# Patient Record
Sex: Female | Born: 1945
Health system: Southern US, Community
[De-identification: ages and names within clinical notes are randomized; demographics above are authoritative.]

## PROBLEM LIST (undated history)

## (undated) DIAGNOSIS — H269 Unspecified cataract: Secondary | ICD-10-CM

## (undated) DIAGNOSIS — L299 Pruritus, unspecified: Secondary | ICD-10-CM

## (undated) DIAGNOSIS — H401430 Capsular glaucoma with pseudoexfoliation of lens, bilateral, stage unspecified: Secondary | ICD-10-CM

## (undated) DIAGNOSIS — I10 Essential (primary) hypertension: Secondary | ICD-10-CM

## (undated) DIAGNOSIS — F32A Depression, unspecified: Secondary | ICD-10-CM

## (undated) DIAGNOSIS — H35351 Cystoid macular degeneration, right eye: Secondary | ICD-10-CM

## (undated) DIAGNOSIS — J302 Other seasonal allergic rhinitis: Secondary | ICD-10-CM

## (undated) DIAGNOSIS — M1990 Unspecified osteoarthritis, unspecified site: Secondary | ICD-10-CM

## (undated) DIAGNOSIS — J45909 Unspecified asthma, uncomplicated: Secondary | ICD-10-CM

## (undated) DIAGNOSIS — K635 Polyp of colon: Secondary | ICD-10-CM

## (undated) DIAGNOSIS — F329 Major depressive disorder, single episode, unspecified: Secondary | ICD-10-CM

## (undated) DIAGNOSIS — Z961 Presence of intraocular lens: Secondary | ICD-10-CM

## (undated) DIAGNOSIS — Z800 Family history of malignant neoplasm of digestive organs: Secondary | ICD-10-CM

## (undated) DIAGNOSIS — H409 Unspecified glaucoma: Secondary | ICD-10-CM

## (undated) DIAGNOSIS — I82409 Acute embolism and thrombosis of unspecified deep veins of unspecified lower extremity: Secondary | ICD-10-CM

## (undated) DIAGNOSIS — H2011 Chronic iridocyclitis, right eye: Secondary | ICD-10-CM

## (undated) DIAGNOSIS — H35033 Hypertensive retinopathy, bilateral: Secondary | ICD-10-CM

## (undated) DIAGNOSIS — Z923 Personal history of irradiation: Secondary | ICD-10-CM

## (undated) DIAGNOSIS — M7020 Olecranon bursitis, unspecified elbow: Secondary | ICD-10-CM

## (undated) DIAGNOSIS — M948X9 Other specified disorders of cartilage, unspecified sites: Secondary | ICD-10-CM

## (undated) DIAGNOSIS — T7840XA Allergy, unspecified, initial encounter: Secondary | ICD-10-CM

## (undated) DIAGNOSIS — Z8719 Personal history of other diseases of the digestive system: Secondary | ICD-10-CM

## (undated) DIAGNOSIS — C50911 Malignant neoplasm of unspecified site of right female breast: Secondary | ICD-10-CM

## (undated) DIAGNOSIS — C50919 Malignant neoplasm of unspecified site of unspecified female breast: Secondary | ICD-10-CM

## (undated) DIAGNOSIS — F419 Anxiety disorder, unspecified: Secondary | ICD-10-CM

## (undated) DIAGNOSIS — H1820 Unspecified corneal edema: Secondary | ICD-10-CM

## (undated) DIAGNOSIS — C159 Malignant neoplasm of esophagus, unspecified: Secondary | ICD-10-CM

## (undated) DIAGNOSIS — Z9221 Personal history of antineoplastic chemotherapy: Secondary | ICD-10-CM

## (undated) HISTORY — DX: Essential (primary) hypertension: I10

## (undated) HISTORY — DX: Other seasonal allergic rhinitis: J30.2

## (undated) HISTORY — DX: Unspecified osteoarthritis, unspecified site: M19.90

## (undated) HISTORY — DX: Depression, unspecified: F32.A

## (undated) HISTORY — DX: Unspecified asthma, uncomplicated: J45.909

## (undated) HISTORY — DX: Major depressive disorder, single episode, unspecified: F32.9

## (undated) HISTORY — DX: Pruritus, unspecified: L29.9

## (undated) HISTORY — DX: Other specified disorders of cartilage, unspecified sites: M94.8X9

## (undated) HISTORY — PX: COLONOSCOPY: SHX174

## (undated) HISTORY — PX: OTHER SURGICAL HISTORY: SHX169

## (undated) HISTORY — DX: Unspecified corneal edema: H18.20

## (undated) HISTORY — DX: Family history of malignant neoplasm of digestive organs: Z80.0

## (undated) HISTORY — DX: Malignant neoplasm of unspecified site of right female breast: C50.911

## (undated) HISTORY — PX: POLYPECTOMY: SHX149

## (undated) HISTORY — DX: Malignant neoplasm of esophagus, unspecified: C15.9

## (undated) HISTORY — DX: Anxiety disorder, unspecified: F41.9

## (undated) HISTORY — PX: CATARACT EXTRACTION: SUR2

## (undated) HISTORY — DX: Malignant neoplasm of unspecified site of unspecified female breast: C50.919

## (undated) HISTORY — DX: Personal history of other diseases of the digestive system: Z87.19

## (undated) HISTORY — DX: Allergy, unspecified, initial encounter: T78.40XA

## (undated) HISTORY — DX: Unspecified cataract: H26.9

## (undated) HISTORY — DX: Polyp of colon: K63.5

## (undated) HISTORY — PX: DUPUYTREN CONTRACTURE RELEASE: SHX1478

## (undated) HISTORY — DX: Chronic iridocyclitis, right eye: H20.11

## (undated) HISTORY — DX: Presence of intraocular lens: Z96.1

## (undated) HISTORY — DX: Acute embolism and thrombosis of unspecified deep veins of unspecified lower extremity: I82.409

## (undated) HISTORY — DX: Olecranon bursitis, unspecified elbow: M70.20

## (undated) HISTORY — DX: Unspecified glaucoma: H40.9

## (undated) HISTORY — DX: Cystoid macular degeneration, right eye: H35.351

## (undated) HISTORY — DX: Capsular glaucoma with pseudoexfoliation of lens, bilateral, stage unspecified: H40.1430

## (undated) HISTORY — DX: Hypertensive retinopathy, bilateral: H35.033

---
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
## 1965-11-25 HISTORY — PX: TONSILLECTOMY: SUR1361

## 1994-11-25 DIAGNOSIS — C50911 Malignant neoplasm of unspecified site of right female breast: Secondary | ICD-10-CM

## 1994-11-25 HISTORY — PX: BREAST LUMPECTOMY: SHX2

## 1994-11-25 HISTORY — DX: Malignant neoplasm of unspecified site of right female breast: C50.911

## 2005-11-25 HISTORY — PX: ROTATOR CUFF REPAIR: SHX139

## 2005-11-25 HISTORY — PX: LASIK: SHX215

## 2009-11-25 HISTORY — PX: BREAST LUMPECTOMY: SHX2

## 2010-11-25 DIAGNOSIS — C50919 Malignant neoplasm of unspecified site of unspecified female breast: Secondary | ICD-10-CM

## 2010-11-25 HISTORY — DX: Malignant neoplasm of unspecified site of unspecified female breast: C50.919

## 2012-08-06 ENCOUNTER — Other Ambulatory Visit: Payer: Self-pay | Admitting: Internal Medicine

## 2012-08-06 DIAGNOSIS — Z9889 Other specified postprocedural states: Secondary | ICD-10-CM

## 2012-08-06 DIAGNOSIS — Z853 Personal history of malignant neoplasm of breast: Secondary | ICD-10-CM

## 2012-08-10 ENCOUNTER — Telehealth: Payer: Self-pay | Admitting: *Deleted

## 2012-08-10 NOTE — Telephone Encounter (Signed)
Left message for pt to return my call so I can schedule a med onc appt. 

## 2012-08-21 ENCOUNTER — Telehealth: Payer: Self-pay | Admitting: *Deleted

## 2012-08-21 NOTE — Telephone Encounter (Signed)
Patient returned my call and I confirmed 09/28/12 appt w/ pt.  Jo Morrison w/ referring to make her aware.  Mailed before appt letter & packet to pt.  Took paperwork to Med [facility] for chart.

## 2012-09-21 ENCOUNTER — Ambulatory Visit
Admission: RE | Admit: 2012-09-21 | Discharge: 2012-09-21 | Disposition: A | Discharge status: Home / Self Care | Payer: Medicare Other | Source: Ambulatory Visit | Attending: Internal Medicine | Admitting: Internal Medicine

## 2012-09-21 DIAGNOSIS — Z9889 Other specified postprocedural states: Secondary | ICD-10-CM

## 2012-09-21 DIAGNOSIS — Z853 Personal history of malignant neoplasm of breast: Secondary | ICD-10-CM

## 2012-09-23 ENCOUNTER — Encounter: Payer: Self-pay | Admitting: Internal Medicine

## 2012-09-24 ENCOUNTER — Other Ambulatory Visit: Payer: Self-pay | Admitting: *Deleted

## 2012-09-24 DIAGNOSIS — D0510 Intraductal carcinoma in situ of unspecified breast: Secondary | ICD-10-CM

## 2012-09-28 ENCOUNTER — Telehealth: Payer: Self-pay | Admitting: *Deleted

## 2012-09-28 ENCOUNTER — Encounter: Payer: Self-pay | Admitting: Oncology

## 2012-09-28 ENCOUNTER — Other Ambulatory Visit (HOSPITAL_BASED_OUTPATIENT_CLINIC_OR_DEPARTMENT_OTHER): Payer: Medicare Other | Admitting: Lab

## 2012-09-28 ENCOUNTER — Ambulatory Visit (HOSPITAL_BASED_OUTPATIENT_CLINIC_OR_DEPARTMENT_OTHER): Payer: Medicare Other | Admitting: Oncology

## 2012-09-28 ENCOUNTER — Ambulatory Visit: Payer: Medicare Other

## 2012-09-28 VITALS — BP 166/80 | HR 78 | Temp 98.5°F | Resp 20 | Ht 63.0 in | Wt 113.3 lb

## 2012-09-28 DIAGNOSIS — Z170 Estrogen receptor positive status [ER+]: Secondary | ICD-10-CM

## 2012-09-28 DIAGNOSIS — E559 Vitamin D deficiency, unspecified: Secondary | ICD-10-CM

## 2012-09-28 DIAGNOSIS — C50911 Malignant neoplasm of unspecified site of right female breast: Secondary | ICD-10-CM

## 2012-09-28 DIAGNOSIS — Z853 Personal history of malignant neoplasm of breast: Secondary | ICD-10-CM

## 2012-09-28 DIAGNOSIS — D0510 Intraductal carcinoma in situ of unspecified breast: Secondary | ICD-10-CM

## 2012-09-28 DIAGNOSIS — C50919 Malignant neoplasm of unspecified site of unspecified female breast: Secondary | ICD-10-CM

## 2012-09-28 DIAGNOSIS — D0590 Unspecified type of carcinoma in situ of unspecified breast: Secondary | ICD-10-CM

## 2012-09-28 LAB — COMPREHENSIVE METABOLIC PANEL (CC13)
ALT: 20 U/L (ref 0–55)
AST: 17 U/L (ref 5–34)
Albumin: 4.1 g/dL (ref 3.5–5.0)
Alkaline Phosphatase: 58 U/L (ref 40–150)
BUN: 17 mg/dL (ref 7.0–26.0)
CO2: 28 mEq/L (ref 22–29)
Calcium: 9.5 mg/dL (ref 8.4–10.4)
Chloride: 105 mEq/L (ref 98–107)
Creatinine: 0.7 mg/dL (ref 0.6–1.1)
Glucose: 87 mg/dl (ref 70–99)
Potassium: 4 mEq/L (ref 3.5–5.1)
Sodium: 140 mEq/L (ref 136–145)
Total Bilirubin: 0.64 mg/dL (ref 0.20–1.20)
Total Protein: 6.5 g/dL (ref 6.4–8.3)

## 2012-09-28 LAB — CBC WITH DIFFERENTIAL/PLATELET
BASO%: 0.5 % (ref 0.0–2.0)
Basophils Absolute: 0 10*3/uL (ref 0.0–0.1)
EOS%: 2.3 % (ref 0.0–7.0)
Eosinophils Absolute: 0.1 10*3/uL (ref 0.0–0.5)
HCT: 41.1 % (ref 34.8–46.6)
HGB: 14 g/dL (ref 11.6–15.9)
LYMPH%: 21.9 % (ref 14.0–49.7)
MCH: 31.1 pg (ref 25.1–34.0)
MCHC: 34 g/dL (ref 31.5–36.0)
MCV: 91.7 fL (ref 79.5–101.0)
MONO#: 0.5 10*3/uL (ref 0.1–0.9)
MONO%: 8.8 % (ref 0.0–14.0)
NEUT#: 3.9 10*3/uL (ref 1.5–6.5)
NEUT%: 66.5 % (ref 38.4–76.8)
Platelets: 240 10*3/uL (ref 145–400)
RBC: 4.48 10*6/uL (ref 3.70–5.45)
RDW: 13.4 % (ref 11.2–14.5)
WBC: 5.9 10*3/uL (ref 3.9–10.3)
lymph#: 1.3 10*3/uL (ref 0.9–3.3)

## 2012-09-28 MED ORDER — TAMOXIFEN CITRATE 20 MG PO TABS: 20.0000 mg | ORAL_TABLET | Freq: Every day | ORAL | Status: DC

## 2012-09-28 NOTE — Patient Instructions (Addendum)
Proceed with tamoxifen  Tamoxifen oral tablet What is this medicine? TAMOXIFEN (ta MOX i fen) blocks the effects of estrogen. It is commonly used to treat breast cancer. It is also used to decrease the chance of breast cancer coming back in women who have received treatment for the disease. It may also help prevent breast cancer in women who have a high risk of developing breast cancer. This medicine may be used for other purposes; ask your health care provider or pharmacist if you have questions. What should I tell my health care provider before I take this medicine? They need to know if you have any of these conditions: -blood clots -blood disease -cataracts or impaired eyesight -endometriosis -high calcium levels -high cholesterol -irregular menstrual cycles -liver disease -stroke -uterine fibroids -an unusual or allergic reaction to tamoxifen, other medicines, foods, dyes, or preservatives -pregnant or trying to get pregnant -breast-feeding How should I use this medicine? Take this medicine by mouth with a glass of water. Follow the directions on the prescription label. You can take it with or without food. Take your medicine at regular intervals. Do not take your medicine more often than directed. Do not stop taking except on your doctor's advice. A special MedGuide will be given to you by the pharmacist with each prescription and refill. Be sure to read this information carefully each time. Talk to your pediatrician regarding the use of this medicine in children. While this drug may be prescribed for selected conditions, precautions do apply. Overdosage: If you think you have taken too much of this medicine contact a poison control center or emergency room at once. NOTE: This medicine is only for you. Do not share this medicine with others. What if I miss a dose? If you miss a dose, take it as soon as you can. If it is almost time for your next dose, take only that dose. Do not take  double or extra doses. What may interact with this medicine? -aminoglutethimide -bromocriptine -chemotherapy drugs -female hormones, like estrogens and birth control pills -letrozole -medroxyprogesterone -phenobarbital -rifampin -warfarin This list may not describe all possible interactions. Give your health care provider a list of all the medicines, herbs, non-prescription drugs, or dietary supplements you use. Also tell them if you smoke, drink alcohol, or use illegal drugs. Some items may interact with your medicine. What should I watch for while using this medicine? Visit your doctor or health care professional for regular checks on your progress. You will need regular pelvic exams, breast exams, and mammograms. If you are taking this medicine to reduce your risk of getting breast cancer, you should know that this medicine does not prevent all types of breast cancer. If breast cancer or other problems occur, there is no guarantee that it will be found at an early stage. Do not become pregnant while taking this medicine or for 2 months after stopping this medicine. Stop taking this medicine if you get pregnant or think you are pregnant and contact your doctor. This medicine may harm your unborn baby. Women who can possibly become pregnant should use birth control methods that do not use hormones during tamoxifen treatment and for 2 months after therapy has stopped. Talk with your health care provider for birth control advice. Do not breast feed while taking this medicine. What side effects may I notice from receiving this medicine? Side effects that you should report to your doctor or health care professional as soon as possible: -changes in vision (blurred vision) -changes  in your menstrual cycle -difficulty breathing or shortness of breath -difficulty walking or talking -new breast lumps -numbness -pelvic pain or pressure -redness, blistering, peeling or loosening of the skin, including  inside the mouth -skin rash or itching (hives) -sudden chest pain -swelling of lips, face, or tongue -swelling, pain or tenderness in your calf or leg -unusual bruising or bleeding -vaginal discharge that is bloody, brown, or rust -weakness -yellowing of the whites of the eyes or skin Side effects that usually do not require medical attention (report to your doctor or health care professional if they continue or are bothersome): -fatigue -hair loss, although uncommon and is usually mild -headache -hot flashes -impotence (in men) -nausea, vomiting (mild) -vaginal discharge (white or clear) This list may not describe all possible side effects. Call your doctor for medical advice about side effects. You may report side effects to FDA at 1-800-FDA-1088. Where should I keep my medicine? Keep out of the reach of children. Store at room temperature between 20 and 25 degrees C (68 and 77 degrees F). Protect from light. Keep container tightly closed. Throw away any unused medicine after the expiration date. NOTE: This sheet is a summary. It may not cover all possible information. If you have questions about this medicine, talk to your doctor, pharmacist, or health care provider.  2012, Elsevier/Gold Standard. (07/28/2008 12:01:56 PM)  See me back in 6 months

## 2012-09-28 NOTE — Telephone Encounter (Signed)
03-01-2013 starting at 2:00pm

## 2012-09-28 NOTE — Progress Notes (Signed)
Checked in new patient. No financial issues. °

## 2012-09-30 ENCOUNTER — Encounter: Payer: Self-pay | Admitting: *Deleted

## 2012-09-30 NOTE — Progress Notes (Signed)
Mailed after appt letter to pt. 

## 2012-10-09 NOTE — Progress Notes (Signed)
[facility]  [phone] [phone]  MEDICAL ONCOLOGY - INITIAL CONSULATION  Referral MD Dr. Oneal Grout  Reason for Referral: 66 year old female with prior history of breast cancer she underwent a lumpectomy and chemoradiation therapy. Her final pathology revealed a ductal carcinoma in situ that was intermediate grade diagnosed 02/14/2010. Patient is now reestablishing her care to [facility] after having moved from [facility] Complaint  Patient presents with  . [name]  : DCIS of Left Breast diagnosed 2011 patient also has a prior history of invasive stage I cancer diagnosed in 1996   HPI: Patient is a very pleasant 66 year old female who has medical history significant for having had a right invasive breast cancer diagnosed in 9T 96. The tumor was ER positive PR positive. She underwent a right lumpectomy with axillary lymph node dissection and chemotherapy consisting of 4 cycles of Adriamycin and Cytoxan followed by adjuvant radiation therapy and 5 years of tamoxifen. Thereafter patient continued to have routine annual mammograms. In December 2010 the patient had a routine screening mammogram she was found to have abnormal calcifications with a stable right breast and new calcifications increased in number slightly at the 11:00 position anterior one third of the left breast with no mammographic mass seen. She wanted on to have spot magnification views that describe indeterminant calcifications and recommendation was for stereotactic biopsy. On 11/22/2009 she underwent a left breast stereotactic biopsy at the 11:00 position that revealed DCIS with solid and cribriform type as well as comedo necrosis. The DCIS measured 1.1 cm ER +99% PR weakly positive at 2%. She then was seen at [facility] [place] [facility] she had a double wire localization left breast lumpectomy. She tolerated the procedure well. Patient went on to have adjuvant  radiation therapy to the left breast. After completing the left breast therapy she proceeded with tamoxifen 20 mg daily. She would like to resume this. And therefore is reestablishing her oncologic care since she moved to [facility] from [place]. She has tolerated the tamoxifen well and is without any complaints.     No past medical history on file.:  No past surgical history on file.:  Current Outpatient Prescriptions  Medication Sig Dispense Refill  . budesonide (PULMICORT) 180 MCG/ACT inhaler Inhale 2 puffs into the lungs 2 (two) times daily.      . clonazePAM (KLONOPIN) 0.5 MG tablet Take 0.5 mg by mouth at bedtime as needed.      . travoprost, benzalkonium, (TRAVATAN) 0.004 % ophthalmic solution Place 1 drop into both eyes at bedtime.      . traZODone (DESYREL) 150 MG tablet Take 150 mg by mouth at bedtime.      . Venlafaxine HCl (EFFEXOR PO) Take 150 mg by mouth.      . tamoxifen (NOLVADEX) 20 MG tablet Take 1 tablet (20 mg total) by mouth daily.  90 tablet  12     No Known Allergies:  No family history on file.:  History   Social History  . Marital Status: Married    Spouse Name: N/A    Number of Children: N/A  . Years of Education: N/A   Occupational History  . Not on file.   Social History Main Topics  . Smoking status: Not on file  . Smokeless tobacco: Not on file  . Alcohol Use: Not on file  . Drug Use: Not on file  . Sexually Active: Not on file   Other Topics  Concern  . Not on file   Social History Narrative  . No narrative on file  :  Pertinent items are noted in HPI.  Exam: Filed Vitals:   09/28/12 1440  BP: 166/80  Pulse: 78  Temp: 98.5 F (36.9 C)  TempSrc: Oral  Resp: 20  Height: 5\' 3"  (1.6 m)  Weight: 113 lb 4.8 oz (51.393 kg)     General:  well-nourished in no acute distress.  Eyes:  no scleral icterus.  ENT:  There were no oropharyngeal lesions.  Neck was without thyromegaly.  Lymphatics:  Negative cervical,  supraclavicular or axillary adenopathy.  Respiratory: lungs were clear bilaterally without wheezing or crackles.  Cardiovascular:  Regular rate and rhythm, S1/S2, without murmur, rub or gallop.  There was no pedal edema.  GI:  abdomen was soft, flat, nontender, nondistended, without organomegaly.  Muscoloskeletal:  no spinal tenderness of palpation of vertebral spine.  Skin exam was without echymosis, petichae.  Neuro exam was nonfocal.  Patient was able to get on and off exam table without assistance.  Gait was normal.  Patient was alerted and oriented.  Attention was good.   Language was appropriate.  Mood was normal without depression.  Speech was not pressured.  Thought content was not tangential.   Bilateral breast examination well-healed incisional scars no nodularity no nipple retraction no evidence of local recurrence in either breast.   Lab Results  Component Value Date   WBC 5.9 09/28/2012   HGB 14.0 09/28/2012   HCT 41.1 09/28/2012   PLT 240 09/28/2012   GLUCOSE 87 09/28/2012   ALT 20 09/28/2012   AST 17 09/28/2012   NA 140 09/28/2012   K 4.0 09/28/2012   CL 105 09/28/2012   CREATININE 0.7 09/28/2012   BUN 17.0 09/28/2012   CO2 28 09/28/2012    Mm Digital Diagnostic Bilat  09/21/2012  *RADIOLOGY REPORT*  Clinical Data:  Annual examination.  History of bilateral breast cancer, diagnosed on the right in 1996 to 1997 and on the left in 2011.  DIGITAL DIAGNOSTIC BILATERAL MAMMOGRAM WITH CAD  Comparison:  Prior outside mammograms from [facility] [place], [place] dated 10/10/2011  Findings:  There are lumpectomy changes in the upper and slightly outer anterior right breast and in the subareolar left breast. No mass, suspicious microcalcification, or nonsurgical distortion is identified in either breast to suggest malignancy.  Mammographic images were processed with CAD.  IMPRESSION: No evidence of malignancy in either breast.  Bilateral lumpectomy changes.  RECOMMENDATION: Bilateral  diagnostic mammogram in 1 year.  I have discussed the findings and recommendations with the patient. Results were also provided in writing at the conclusion of the visit.  BI-RADS CATEGORY 2:  Benign finding(s).   Original Report Authenticated By: Britta Mccreedy, M.D.       Mm Digital Diagnostic Bilat  09/21/2012  *RADIOLOGY REPORT*  Clinical Data:  Annual examination.  History of bilateral breast cancer, diagnosed on the right in 1996 to 1997 and on the left in 2011.  DIGITAL DIAGNOSTIC BILATERAL MAMMOGRAM WITH CAD  Comparison:  Prior outside mammograms from [facility] in [place], [place] dated 10/10/2011  Findings:  There are lumpectomy changes in the upper and slightly outer anterior right breast and in the subareolar left breast. No mass, suspicious microcalcification, or nonsurgical distortion is identified in either breast to suggest malignancy.  Mammographic images were processed with CAD.  IMPRESSION: No evidence of malignancy in either breast.  Bilateral lumpectomy changes.  RECOMMENDATION: Bilateral diagnostic mammogram  in 1 year.  I have discussed the findings and recommendations with the patient. Results were also provided in writing at the conclusion of the visit.  BI-RADS CATEGORY 2:  Benign finding(s).   Original Report Authenticated By: Britta Mccreedy, M.D.     Assessment and Plan: 66 year old female with  #1 prior history of invasive breast cancer diagnosed in 1996 this was a stage I ER positive PR positive HER-2/neu negative breast cancer. She underwent a right lumpectomy with axillary lymph node dissection. Followed by adjuvant chemotherapy consisting of Adriamycin and Cytoxan for 4 cycles. She then received adjuvant radiation therapy and 5 years of tamoxifen. #2 10/26/2009 patient had microcalcifications in the left breast she went on to have a stereotactic biopsy performed that revealed DCIS that was ER positive. She's had a lumpectomy. Postoperatively she underwent adjuvant  radiation therapy. She is now on tamoxifen. Overall she is tolerating it well without any problems.  #3 we will continue tamoxifen for a total of 5 years. Risks and benefits of tamoxifen are understood by the patient completely since she has been treated with this before. The graft #4 I will plan on continuing to see her every 6 months.

## 2012-10-12 ENCOUNTER — Encounter: Payer: Self-pay | Admitting: Oncology

## 2012-10-12 DIAGNOSIS — C50511 Malignant neoplasm of lower-outer quadrant of right female breast: Secondary | ICD-10-CM | POA: Insufficient documentation

## 2012-10-20 ENCOUNTER — Encounter: Payer: Self-pay | Admitting: Oncology

## 2012-10-20 ENCOUNTER — Ambulatory Visit (INDEPENDENT_AMBULATORY_CARE_PROVIDER_SITE_OTHER): Payer: Medicare Other | Admitting: Internal Medicine

## 2012-10-20 ENCOUNTER — Encounter: Payer: Self-pay | Admitting: Internal Medicine

## 2012-10-20 VITALS — BP 134/80 | HR 92 | Ht 63.0 in | Wt 117.0 lb

## 2012-10-20 DIAGNOSIS — Z86010 Personal history of colonic polyps: Secondary | ICD-10-CM

## 2012-10-20 DIAGNOSIS — R159 Full incontinence of feces: Secondary | ICD-10-CM

## 2012-10-20 NOTE — Patient Instructions (Addendum)
There was some mild sphincter weakness detected on anorectal exam. The significance is unclear. Please call back and let me know if you have more problems with leakage of stool - if that happens would then likely refer for further testing.  Next routine colonoscopy in 2021  Thank you for choosing me and [facility].  Iva Boop, MD, [facility]

## 2012-10-20 NOTE — Progress Notes (Addendum)
Subjective:    Patient ID: Jo Morrison, female    DOB: 01/01/1946, 66 y.o.   MRN: [id number]  HPI This is a very pleasant elderly woman who presents today describing 2 episodes of internal fecal incontinence that occurred a few days apart. He awakened her from sleep. She did not have diarrhea. This was earlier in the month. She's never had this before. There is absolutely no bowel habit changes, no diarrhea or constipation. She has had regular colonoscopies, every 5 years because of a history of breast cancer, the last being 2011 in [place]. No polyps, no particular abnormalities reported by the patient. I do not have that report.  No Known Allergies Outpatient Prescriptions Prior to Visit  Medication Sig Dispense Refill  . budesonide (RHINOCORT AQUA) 32 MCG/ACT nasal spray Place 1 spray into the nose daily.      . clonazePAM (KLONOPIN) 0.5 MG tablet Take 0.5 mg by mouth at bedtime.      . travoprost, benzalkonium, (TRAVATAN) 0.004 % ophthalmic solution Place 1 drop into both eyes at bedtime.      . traZODone (DESYREL) 150 MG tablet Take 150 mg by mouth at bedtime.      Marland Kitchen venlafaxine XR (EFFEXOR-XR) 150 MG 24 hr capsule Take 150 mg by mouth daily.      . [DISCONTINUED] beclomethasone (QVAR) 40 MCG/ACT inhaler Inhale 1 puff into the lungs 2 (two) times daily.      . [DISCONTINUED] cetirizine (ZYRTEC) 5 MG tablet Take 5 mg by mouth daily.      . [DISCONTINUED] tamoxifen (NOLVADEX) 20 MG tablet Take 20 mg by mouth daily.       Last reviewed on 09/28/2012  4:13 PM by Victorino December, MD Past Medical History  Diagnosis Date  . Breast cancer, right 10/12/2012  . Seasonal allergies   . Breast cancer 1996  . Hypertension   . Anxiety   . Depressive disorder   . Asthma   . Osteoarthritis   . DVT (deep venous thrombosis)   . Glaucoma    Past Surgical History  Procedure Date  . Colonoscopy 2011-last  . Appendectomy   . Tonsillectomy   . Breast lumpectomy 1996    Dr. Corliss Marcus in [place]  .  Breast lumpectomy 2011    Dr. Marisue Humble  . Rotator cuff repair 2007   History   Social History  . Marital Status: Unknown    Spouse Name: N/A    Number of Children: 0       Social History Main Topics  . Smoking status: Never Smoker   . Smokeless tobacco: Never Used  . Alcohol Use: Yes     Comment: wine occassionaly  . Drug Use: No    Social History Narrative   ** Merged History Encounter ** Two cups of coffee daily   Family History  Problem Relation Age of Onset  . Stroke Father 21  . Heart disease Brother     Review of Systems No urinary leakage, no back pain or LE weakness    Objective:   Physical Exam General:  Well-developed, well-nourished and in no acute distress Neck:   supple w/o thyromegaly or mass.  Lungs: Clear to auscultation bilaterally. Heart:  S1S2, no rubs, murmurs, gallops. Abdomen:  soft, non-tender, no hepatosplenomegaly, hernia, or mass and BS+.  Rectal: Female staff present - normal anoderm, absent anal wink, no mass or rectocele, there is slightly reduced resting tone, reduced voluntary squeeze, and diminished descent with  simulated defecation -  and weak abdominal wall contraction Lymph:  no cervical or supraclavicular adenopathy. Extremities:   no edema Skin   no rash. Neuro:  A&O x 3.  Psych:  appropriate mood and  Affect.   Data Reviewed:  [facility] notes  Current outpatient prescriptions:budesonide (PULMICORT) 180 MCG/ACT inhaler, Inhale 1 puff into the lungs 2 (two) times daily., Disp: , Rfl: ;  budesonide (RHINOCORT AQUA) 32 MCG/ACT nasal spray, Place 1 spray into the nose daily., Disp: , Rfl: ;  clonazePAM (KLONOPIN) 0.5 MG tablet, Take 0.5 mg by mouth at bedtime., Disp: , Rfl:  travoprost, benzalkonium, (TRAVATAN) 0.004 % ophthalmic solution, Place 1 drop into both eyes at bedtime., Disp: , Rfl: ;  traZODone (DESYREL) 150 MG tablet, Take 150 mg by mouth at bedtime., Disp: , Rfl: ;  venlafaxine XR (EFFEXOR-XR) 150 MG 24 hr  capsule, Take 150 mg by mouth daily., Disp: , Rfl: ;  budesonide (PULMICORT) 180 MCG/ACT inhaler, Inhale 2 puffs into the lungs 2 (two) times daily., Disp: , Rfl:  clonazePAM (KLONOPIN) 0.5 MG tablet, Take 0.5 mg by mouth at bedtime as needed., Disp: , Rfl: ;  tamoxifen (NOLVADEX) 20 MG tablet, Take 1 tablet (20 mg total) by mouth daily., Disp: 90 tablet, Rfl: 12;  travoprost, benzalkonium, (TRAVATAN) 0.004 % ophthalmic solution, Place 1 drop into both eyes at bedtime., Disp: , Rfl: ;  traZODone (DESYREL) 150 MG tablet, Take 150 mg by mouth at bedtime., Disp: , Rfl:  Venlafaxine HCl (EFFEXOR PO), Take 150 mg by mouth., Disp: , Rfl:       Assessment & Plan:   1. Fecal incontinence    Loss of this is unclear. There seems to be some mildly abnormal anorectal function on physical exam. I should she have some sort of pelvic floor dysfunction though I don't know how that would've caused spontaneous fecal incontinence while asleep. He does not have associated neurologic symptoms or urinary symptoms to suggest some sort of more systemic process or final disorder.  Plan is to observe, if she has recurrent problems I think I would refer her to the colorectal surgeon for evaluation and possible anorectal manometry.  CC: Oneal Grout, MD   Colonoscopy records reviewed  2001 - diminutive hyperplastic polyp 2006 no polyps 8 mm polyp removed incompletely it said but not retrieved 10/2010 colonoscopy [facility]) - ? Of family hx colon cancer per report though she did not indicate  So needs repeat colonoscopy sooner - will contact her to clarify above

## 2012-11-01 DIAGNOSIS — Z86010 Personal history of colonic polyps: Secondary | ICD-10-CM | POA: Insufficient documentation

## 2012-11-02 ENCOUNTER — Telehealth: Payer: Self-pay

## 2012-11-02 NOTE — Telephone Encounter (Signed)
Message copied by [place], Yadier Bramhall E on Mon Nov 02, 2012  3:33 PM ------      Message from: Iva Boop      Created: Sun Nov 01, 2012  2:26 PM      Regarding: clarify hx       Let her know I reviewed colonoscopy records            It said she had a relative with colon cancer when they were less than age 66 - please clarify that            Also she had a polyp in 2011 and it said it was not completely removed and not recovered so think she will need colonoscopy in 2014 but will clarify once I hear back

## 2012-11-02 NOTE — Telephone Encounter (Signed)
Patient informed that Dr. Leone Payor reviewed records.  She reports that her Maternal Aunt had colon cancer in her mid 81's.  I told her that I will inform Dr. Leone Payor and be in touch about date of future colonoscopy.

## 2012-11-03 NOTE — Telephone Encounter (Signed)
Maternal aunts history not relevant  Due to polyp and report that it was incompletely removed, I recommend repeat colonoscopy 11/2012  Please arrange  Dx hx polyps

## 2012-11-04 NOTE — Telephone Encounter (Signed)
Spoke to patient and set up a pre-visit and colonoscopy appointments.  Going to mail patient list of foods not to eat 5 days prior to colonoscopy per patients request.

## 2012-12-07 ENCOUNTER — Ambulatory Visit (AMBULATORY_SURGERY_CENTER): Payer: Medicare Other | Admitting: *Deleted

## 2012-12-07 VITALS — Ht 63.0 in | Wt 115.6 lb

## 2012-12-07 DIAGNOSIS — Z1211 Encounter for screening for malignant neoplasm of colon: Secondary | ICD-10-CM

## 2012-12-07 DIAGNOSIS — Z86010 Personal history of colonic polyps: Secondary | ICD-10-CM

## 2012-12-07 MED ORDER — NA SULFATE-K SULFATE-MG SULF 17.5-3.13-1.6 GM/177ML PO SOLN: ORAL | Status: DC

## 2012-12-09 ENCOUNTER — Ambulatory Visit (AMBULATORY_SURGERY_CENTER): Payer: Medicare Other | Admitting: Internal Medicine

## 2012-12-09 ENCOUNTER — Encounter: Payer: Self-pay | Admitting: Internal Medicine

## 2012-12-09 VITALS — BP 141/63 | HR 73 | Temp 97.6°F | Resp 20 | Ht 63.0 in | Wt 115.0 lb

## 2012-12-09 DIAGNOSIS — D126 Benign neoplasm of colon, unspecified: Secondary | ICD-10-CM

## 2012-12-09 DIAGNOSIS — Z86010 Personal history of colonic polyps: Secondary | ICD-10-CM

## 2012-12-09 DIAGNOSIS — Z1211 Encounter for screening for malignant neoplasm of colon: Secondary | ICD-10-CM

## 2012-12-09 MED ORDER — SODIUM CHLORIDE 0.9 % IV SOLN: 500.0000 mL | INTRAVENOUS | Status: DC

## 2012-12-09 NOTE — Patient Instructions (Addendum)
I removed three polyps today. I think they are all benign and will let you and Dr. Glade Lloyd know the results. Prep was excellent!  I will let you know pathology results and when to have another routine colonoscopy by mail.  Thank you for choosing me and [facility].  Iva Boop, MD, [facility]  Resume medications. Information on polyps given with discharge instructions.  YOU HAD AN ENDOSCOPIC PROCEDURE TODAY AT THE [facility]: Refer to the procedure report that was given to you for any specific questions about what was found during the examination.  If the procedure report does not answer your questions, please call your gastroenterologist to clarify.  If you requested that your care partner not be given the details of your procedure findings, then the procedure report has been included in a sealed envelope for you to review at your convenience later.  YOU SHOULD EXPECT: Some feelings of bloating in the abdomen. Passage of more gas than usual.  Walking can help get rid of the air that was put into your GI tract during the procedure and reduce the bloating. If you had a lower endoscopy (such as a colonoscopy or flexible sigmoidoscopy) you may notice spotting of blood in your stool or on the toilet paper. If you underwent a bowel prep for your procedure, then you may not have a normal bowel movement for a few days.  DIET: Your first meal following the procedure should be a light meal and then it is ok to progress to your normal diet.  A half-sandwich or bowl of soup is an example of a good first meal.  Heavy or fried foods are harder to digest and may make you feel nauseous or bloated.  Likewise meals heavy in dairy and vegetables can cause extra gas to form and this can also increase the bloating.  Drink plenty of fluids but you should avoid alcoholic beverages for 24 hours.  ACTIVITY: Your care partner should take you home directly after the procedure.  You should plan to  take it easy, moving slowly for the rest of the day.  You can resume normal activity the day after the procedure however you should NOT DRIVE or use heavy machinery for 24 hours (because of the sedation medicines used during the test).    SYMPTOMS TO REPORT IMMEDIATELY: A gastroenterologist can be reached at any hour.  During normal business hours, 8:30 AM to 5:00 PM Monday through Friday, call [phone].  After hours and on weekends, please call the GI answering service at [phone] who will take a message and have the physician on call contact you.   Following lower endoscopy (colonoscopy or flexible sigmoidoscopy):  Excessive amounts of blood in the stool  Significant tenderness or worsening of abdominal pains  Swelling of the abdomen that is new, acute  Fever of 100F or higher   FOLLOW UP: If any biopsies were taken you will be contacted by phone or by letter within the next 1-3 weeks.  Call your gastroenterologist if you have not heard about the biopsies in 3 weeks.  Our staff will call the home number listed on your records the next business day following your procedure to check on you and address any questions or concerns that you may have at that time regarding the information given to you following your procedure. This is a courtesy call and so if there is no answer at the home number and we have not heard from you through the  emergency physician on call, we will assume that you have returned to your regular daily activities without incident.  SIGNATURES/CONFIDENTIALITY: You and/or your care partner have signed paperwork which will be entered into your electronic medical record.  These signatures attest to the fact that that the information above on your After Visit Summary has been reviewed and is understood.  Full responsibility of the confidentiality of this discharge information lies with you and/or your care-partner.

## 2012-12-09 NOTE — Progress Notes (Signed)
Patient did not experience any of the following events: a burn prior to discharge; a fall within the facility; wrong site/side/patient/procedure/implant event; or a hospital transfer or hospital admission upon discharge from the facility. (G8907) Patient did not have preoperative order for IV antibiotic SSI prophylaxis. (G8918)  

## 2012-12-09 NOTE — Progress Notes (Signed)
Called to room to assist during endoscopic procedure.  Patient ID and intended procedure confirmed with present staff. Received instructions for my participation in the procedure from the performing physician. ewm 

## 2012-12-09 NOTE — Op Note (Signed)
[facility] [address] [place], [address]   COLONOSCOPY PROCEDURE REPORT  PATIENT: Jo Morrison, Jo Morrison  MR#: [id number] BIRTHDATE: 1946/06/27 , 66  yrs. old GENDER: Female ENDOSCOPIST: Iva Boop, MD, [facility] PROCEDURE DATE:  12/09/2012 PROCEDURE:   Colonoscopy with snare polypectomy ASA CLASS:   Class II INDICATIONS:Screening and surveillance,personal history of colonic polyps. MEDICATIONS: propofol (Diprivan) 350mg  IV, MAC sedation, administered by CRNA, and These medications were titrated to patient response per physician's verbal order  DESCRIPTION OF PROCEDURE:   After the risks benefits and alternatives of the procedure were thoroughly explained, informed consent was obtained.  A digital rectal exam revealed no abnormalities of the rectum.   The LB [id number]  endoscope was introduced through the anus and advanced to the cecum, which was identified by both the appendix and ileocecal valve. No adverse events experienced.   The quality of the prep was Suprep excellent The instrument was then slowly withdrawn as the colon was fully examined.      COLON FINDINGS: A polypoid shaped sessile polyp measuring 8 mm in size was found in the transverse colon.  A polypectomy was performed with a cold snare.  The resection was complete and the polyp tissue was completely retrieved.   Two diminutive polypoid shaped sessile polyps were found at the cecum.  A polypectomy was performed with cold forceps.  The resection was complete and the polyp tissue was completely retrieved.   The colon mucosa was otherwise normal.   A right colon retroflexion was performed. Retroflexed views revealed no abnormalities. The time to cecum=3 minutes 06 seconds.  Withdrawal time=13 minutes 48 seconds.  The scope was withdrawn and the procedure completed. COMPLICATIONS: There were no complications.  ENDOSCOPIC IMPRESSION: 1.   Sessile polyp measuring 8 mm in size was found in  the transverse colon; polypectomy was performed with a cold snare 2.   Two diminutive sessile polyps were found at the cecum; polypectomy was performed with cold forceps 3.   The colon mucosa was otherwise normal - excellent prep in patiet with hx polyp partially removed and not retrieved 2011  RECOMMENDATIONS: Timing of repeat colonoscopy will be determined by pathology findings.   eSigned:  Iva Boop, MD, [facility] 12/09/2012 3:29 PM cc: The Patient  and Oneal Grout, MD

## 2012-12-10 ENCOUNTER — Telehealth: Payer: Self-pay | Admitting: *Deleted

## 2012-12-10 NOTE — Telephone Encounter (Signed)
No answer, no identifier. Message left to call if questions or concerns. 

## 2012-12-16 ENCOUNTER — Encounter: Payer: Self-pay | Admitting: Internal Medicine

## 2013-01-18 ENCOUNTER — Other Ambulatory Visit: Payer: Self-pay | Admitting: Emergency Medicine

## 2013-01-18 MED ORDER — TAMOXIFEN CITRATE 20 MG PO TABS: 20.0000 mg | ORAL_TABLET | Freq: Every day | ORAL | Status: DC

## 2013-03-01 ENCOUNTER — Ambulatory Visit (HOSPITAL_BASED_OUTPATIENT_CLINIC_OR_DEPARTMENT_OTHER): Payer: Medicare Other | Admitting: Oncology

## 2013-03-01 ENCOUNTER — Encounter: Payer: Self-pay | Admitting: Oncology

## 2013-03-01 ENCOUNTER — Other Ambulatory Visit (HOSPITAL_BASED_OUTPATIENT_CLINIC_OR_DEPARTMENT_OTHER): Payer: Medicare Other | Admitting: Lab

## 2013-03-01 VITALS — BP 143/82 | HR 88 | Temp 97.8°F | Resp 20 | Ht 63.0 in | Wt 114.6 lb

## 2013-03-01 DIAGNOSIS — E559 Vitamin D deficiency, unspecified: Secondary | ICD-10-CM

## 2013-03-01 DIAGNOSIS — Z170 Estrogen receptor positive status [ER+]: Secondary | ICD-10-CM

## 2013-03-01 DIAGNOSIS — D0590 Unspecified type of carcinoma in situ of unspecified breast: Secondary | ICD-10-CM

## 2013-03-01 DIAGNOSIS — C50911 Malignant neoplasm of unspecified site of right female breast: Secondary | ICD-10-CM

## 2013-03-01 DIAGNOSIS — Z853 Personal history of malignant neoplasm of breast: Secondary | ICD-10-CM

## 2013-03-01 DIAGNOSIS — C50919 Malignant neoplasm of unspecified site of unspecified female breast: Secondary | ICD-10-CM

## 2013-03-01 LAB — CBC WITH DIFFERENTIAL/PLATELET
BASO%: 0.6 % (ref 0.0–2.0)
Basophils Absolute: 0 10*3/uL (ref 0.0–0.1)
EOS%: 1.4 % (ref 0.0–7.0)
Eosinophils Absolute: 0.1 10*3/uL (ref 0.0–0.5)
HCT: 41.3 % (ref 34.8–46.6)
HGB: 13.6 g/dL (ref 11.6–15.9)
LYMPH%: 12.1 % — ABNORMAL LOW (ref 14.0–49.7)
MCH: 29.7 pg (ref 25.1–34.0)
MCHC: 32.8 g/dL (ref 31.5–36.0)
MCV: 90.5 fL (ref 79.5–101.0)
MONO#: 0.5 10*3/uL (ref 0.1–0.9)
MONO%: 7.1 % (ref 0.0–14.0)
NEUT#: 6 10*3/uL (ref 1.5–6.5)
NEUT%: 78.8 % — ABNORMAL HIGH (ref 38.4–76.8)
Platelets: 245 10*3/uL (ref 145–400)
RBC: 4.56 10*6/uL (ref 3.70–5.45)
RDW: 13.2 % (ref 11.2–14.5)
WBC: 7.6 10*3/uL (ref 3.9–10.3)
lymph#: 0.9 10*3/uL (ref 0.9–3.3)

## 2013-03-01 LAB — COMPREHENSIVE METABOLIC PANEL (CC13)
ALT: 13 U/L (ref 0–55)
AST: 20 U/L (ref 5–34)
Albumin: 3.8 g/dL (ref 3.5–5.0)
Alkaline Phosphatase: 56 U/L (ref 40–150)
BUN: 21.4 mg/dL (ref 7.0–26.0)
CO2: 28 mEq/L (ref 22–29)
Calcium: 9.1 mg/dL (ref 8.4–10.4)
Chloride: 101 mEq/L (ref 98–107)
Creatinine: 0.7 mg/dL (ref 0.6–1.1)
Glucose: 96 mg/dl (ref 70–99)
Potassium: 3.8 mEq/L (ref 3.5–5.1)
Sodium: 139 mEq/L (ref 136–145)
Total Bilirubin: 0.47 mg/dL (ref 0.20–1.20)
Total Protein: 7 g/dL (ref 6.4–8.3)

## 2013-03-01 MED ORDER — TAMOXIFEN CITRATE 20 MG PO TABS: 20.0000 mg | ORAL_TABLET | Freq: Every day | ORAL | Status: DC

## 2013-03-01 NOTE — Progress Notes (Signed)
OFFICE PROGRESS NOTE  CC  Oneal Grout, MD [address] [place] [address]  DIAGNOSIS: 67 year old female with prior history of breast cancer she underwent a lumpectomy and chemoradiation therapy. 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 She underwent a right lumpectomy with excellent lymph node dissection and chemotherapy consisting of 4 cycles of Adriamycin and Cytoxan followed by adjuvant radiation and 5 years of tamoxifen.  #2 in December 2010 patient had a routine screening mammogram that found abnormal calcifications with a stable right breast and new calcifications increased in number slightly at the 11:00 position in the anterior one third of the left breast. Without any mammographic mass. She had spot magnification views which described the calcifications to be indeterminant. 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 The DCIS measured 1.1 cm ER +99% PR weakly positive at 2%.  #3 patient had wire localized lumpectomy she tolerated the procedure well. She went on to have adjuvant radiation therapy to the left breast. She was then placed on tamoxifen 20 mg daily. All of her oncologic care was in [place]. She is now in [facility] I saw her for the first time on 09/28/2012.  CURRENT THERAPY: Tamoxifen 20 mg daily  INTERVAL HISTORY: Jo Morrison 67 y.o. female returns for followup visit at 6 months. She seems to be doing well she has really no side effects from tamoxifen except for night sweats. She denies any fevers chills night sweats headaches shortness of breath chest pains palpitations no myalgias and arthralgias no peripheral paresthesias. She  is very active. Remainder of the 10 point review of systems is negative.  MEDICAL HISTORY: Past Medical History  Diagnosis Date  . Seasonal allergies   . Hypertension   . Anxiety   . Depressive disorder   . Asthma   . Osteoarthritis   . DVT (deep venous thrombosis)   . Glaucoma   . Breast cancer, right 1996    right  . Breast cancer 2012    left    ALLERGIES:  has No Known Allergies.  MEDICATIONS:  Current Outpatient Prescriptions  Medication Sig Dispense Refill  . BIOTIN PO Take 1 tablet by mouth daily.      . budesonide (PULMICORT) 180 MCG/ACT inhaler Inhale 2 puffs into the lungs 2 (two) times daily.      . budesonide (RHINOCORT AQUA) 32 MCG/ACT nasal spray Place 1 spray into the nose daily.      . Cholecalciferol (VITAMIN D PO) Take 1 tablet by mouth daily.      . clonazePAM (KLONOPIN) 0.5 MG tablet Take 0.5 mg by mouth at bedtime as needed.      . hydrochlorothiazide (HYDRODIURIL) 50 MG tablet Take 50 mg by mouth daily.      . tamoxifen (NOLVADEX) 20 MG tablet Take 1 tablet (20 mg total) by mouth daily.  30 tablet  6  . travoprost, benzalkonium, (TRAVATAN) 0.004 % ophthalmic solution Place 1 drop into both eyes at bedtime.      . traZODone (DESYREL) 150 MG tablet Take 150 mg by mouth at bedtime.      . Venlafaxine HCl (EFFEXOR PO) Take 150 mg by mouth.       No  current facility-administered medications for this visit.    SURGICAL HISTORY:  Past Surgical History  Procedure Laterality Date  . Colonoscopy  2011-last  . Appendectomy    . Tonsillectomy    . Breast lumpectomy  1996    Dr. Corliss Marcus in [place]  . Breast lumpectomy  2011    Dr. Marisue Humble  . Rotator cuff repair  2007    REVIEW OF SYSTEMS:  Pertinent items are noted in HPI.   HEALTH MAINTENANCE:  PHYSICAL EXAMINATION: Blood pressure 143/82, pulse 88, [name], temperature source Oral, resp. rate 20, height 5\' 3"  (1.6 m), weight 114 lb 9.6 oz (51.982 kg). Body mass index is  20.31 kg/(m^2). ECOG PERFORMANCE STATUS: 0 - Asymptomatic  HEENT exam EOMI PERRLA sclerae anicteric no conjunctival pallor oral mucosa is moist neck is supple lungs are clear bilaterally to auscultation and percussion cardiovascular is regular rate rhythm abdomen is soft nontender nondistended bowel sounds are present no HSM extremities no edema neuro patient's alert oriented otherwise nonfocal Bilateral breast examination: Bilaterally well-healed surgical scars no nipple inversion discharge no skin changes no evidence of local recurrence.   LABORATORY DATA: Lab Results  Component Value Date   WBC 7.6 03/01/2013   HGB 13.6 03/01/2013   HCT 41.3 03/01/2013   MCV 90.5 03/01/2013   PLT 245 03/01/2013      Chemistry      Component Value Date/Time   NA 140 09/28/2012 1419   K 4.0 09/28/2012 1419   CL 105 09/28/2012 1419   CO2 28 09/28/2012 1419   BUN 17.0 09/28/2012 1419   CREATININE 0.7 09/28/2012 1419      Component Value Date/Time   CALCIUM 9.5 09/28/2012 1419   ALKPHOS 58 09/28/2012 1419   AST 17 09/28/2012 1419   ALT 20 09/28/2012 1419   BILITOT 0.64 09/28/2012 1419       RADIOGRAPHIC STUDIES:  No results found.  ASSESSMENT: 67 year old female with  #1 bilateral breast cancers diagnosed in 1996 and 2010. Left breast with DCIS diagnosed in 2010 ER PR positive. Right breast with invasive ductal carcinoma diagnosed in 1996 treated with 4 cycles of a.c. followed by adjuvant radiation and 5 years of tamoxifen. Patient now without any evidence of local recurrence.  #2 patient is on chemoprevention with tamoxifen for her recent DCIS. She is tolerating it well. Mammograms are up to date.   PLAN:   #1 patient will continue tamoxifen 20 mg daily.  #2 she'll be seen back in 6 months time for followup.   All questions were answered. The patient knows to call the clinic with any problems, questions or concerns. We can certainly see the patient much sooner if necessary.  I spent 25 minutes  counseling the patient face to face. The total time spent in the appointment was 30 minutes.    Drue Second, MD Medical/Oncology [facility] [phone] (beeper) [phone] (Office)  03/01/2013, 2:33 PM

## 2013-03-01 NOTE — Patient Instructions (Addendum)
Continue tamoxifen daily  I will see you back in 6 months  Gynecologist: Dr. Maxie Better

## 2013-03-02 LAB — VITAMIN D 25 HYDROXY (VIT D DEFICIENCY, FRACTURES): Vit D, 25-Hydroxy: 52 ng/mL (ref 30–89)

## 2013-03-03 ENCOUNTER — Telehealth: Payer: Self-pay | Admitting: Oncology

## 2013-03-03 NOTE — Telephone Encounter (Signed)
S/w pt re appt for 10/20.

## 2013-04-02 ENCOUNTER — Other Ambulatory Visit: Payer: Self-pay | Admitting: *Deleted

## 2013-04-02 DIAGNOSIS — J301 Allergic rhinitis due to pollen: Secondary | ICD-10-CM

## 2013-04-02 DIAGNOSIS — I10 Essential (primary) hypertension: Secondary | ICD-10-CM

## 2013-04-02 DIAGNOSIS — C50919 Malignant neoplasm of unspecified site of unspecified female breast: Secondary | ICD-10-CM

## 2013-05-10 ENCOUNTER — Other Ambulatory Visit: Payer: Self-pay | Admitting: Internal Medicine

## 2013-06-14 ENCOUNTER — Other Ambulatory Visit: Payer: Self-pay

## 2013-06-14 DIAGNOSIS — J301 Allergic rhinitis due to pollen: Secondary | ICD-10-CM

## 2013-06-14 DIAGNOSIS — I10 Essential (primary) hypertension: Secondary | ICD-10-CM

## 2013-06-14 DIAGNOSIS — C50919 Malignant neoplasm of unspecified site of unspecified female breast: Secondary | ICD-10-CM

## 2013-06-15 ENCOUNTER — Encounter: Payer: Self-pay | Admitting: *Deleted

## 2013-06-16 ENCOUNTER — Ambulatory Visit (INDEPENDENT_AMBULATORY_CARE_PROVIDER_SITE_OTHER): Payer: Medicare Other | Admitting: Internal Medicine

## 2013-06-16 ENCOUNTER — Encounter: Payer: Self-pay | Admitting: Internal Medicine

## 2013-06-16 VITALS — BP 124/78 | HR 76 | Temp 98.4°F | Resp 14 | Ht 63.0 in | Wt 113.8 lb

## 2013-06-16 DIAGNOSIS — Z853 Personal history of malignant neoplasm of breast: Secondary | ICD-10-CM

## 2013-06-16 DIAGNOSIS — F419 Anxiety disorder, unspecified: Secondary | ICD-10-CM | POA: Insufficient documentation

## 2013-06-16 DIAGNOSIS — I10 Essential (primary) hypertension: Secondary | ICD-10-CM | POA: Insufficient documentation

## 2013-06-16 DIAGNOSIS — H409 Unspecified glaucoma: Secondary | ICD-10-CM

## 2013-06-16 DIAGNOSIS — F329 Major depressive disorder, single episode, unspecified: Secondary | ICD-10-CM | POA: Insufficient documentation

## 2013-06-16 DIAGNOSIS — J45909 Unspecified asthma, uncomplicated: Secondary | ICD-10-CM | POA: Insufficient documentation

## 2013-06-16 DIAGNOSIS — M1990 Unspecified osteoarthritis, unspecified site: Secondary | ICD-10-CM | POA: Insufficient documentation

## 2013-06-16 DIAGNOSIS — Z0000 Encounter for general adult medical examination without abnormal findings: Secondary | ICD-10-CM

## 2013-06-16 DIAGNOSIS — Z23 Encounter for immunization: Secondary | ICD-10-CM

## 2013-06-16 DIAGNOSIS — F411 Generalized anxiety disorder: Secondary | ICD-10-CM

## 2013-06-16 DIAGNOSIS — M948X9 Other specified disorders of cartilage, unspecified sites: Secondary | ICD-10-CM

## 2013-06-16 DIAGNOSIS — J4520 Mild intermittent asthma, uncomplicated: Secondary | ICD-10-CM

## 2013-06-16 MED ORDER — HYDROCHLOROTHIAZIDE 25 MG PO TABS: 25.0000 mg | ORAL_TABLET | Freq: Every day | ORAL | Status: DC

## 2013-06-16 NOTE — Progress Notes (Signed)
Patient ID: Jo Morrison, female   DOB: 10-25-1946, 67 y.o.   MRN: [id number]  Code Status: living will  No Known Allergies  Chief Complaint  Patient presents with  . Annual Exam    No Complaints    HPI:  67 y/o female patient is here for her annual. She denies any complaints this visit. She is on her yard working 3-4 hours a day. She tries to be careful about her meals.  Last dexa scan nov 2011  Colonoscopy 2014 normal Mammogram 07/2012 normal Pap smear due- wants to see Gyn uptodate with influenza 2013 and shingles vaccine 2014 Has not received pneumococcal vaccine  Review of Systems  Constitutional: Negative for fever, chills, weight loss, malaise/fatigue and diaphoresis.       Has night sweats but has been on tamoxifen  HENT: Negative for hearing loss, congestion, sore throat and tinnitus.   Eyes: Negative for blurred vision, pain and discharge.       Wears corrective lenses, seen her eye doctor in June 2014. On travoprost for her glaucoma  Respiratory: Negative for cough, sputum production, shortness of breath and wheezing.   Cardiovascular: Negative for chest pain, palpitations, orthopnea, claudication and leg swelling.  Gastrointestinal: Negative for heartburn, nausea, vomiting, abdominal pain, diarrhea, constipation and blood in stool.       Had colonoscopy with Dr Leone Payor and a sessile polyp was removed, benign findings, repeat in 5 years recommended  Genitourinary: Negative for dysuria, urgency, frequency and hematuria.       Denies any vaginal dryness or itching, last pap smear 2 years back was normal. No hx of abnormal pap smear  Musculoskeletal: Negative for myalgias, joint pain and falls.       Arthritis pain ocassionally  Skin: Negative for itching and rash.  Neurological: Negative for dizziness, tingling, tremors, sensory change, focal weakness, seizures, loss of consciousness, weakness and headaches.  Endo/Heme/Allergies: Negative for polydipsia. Bruises/bleeds  easily.  Psychiatric/Behavioral: Negative for depression, hallucinations and memory loss. The patient is not nervous/anxious and does not have insomnia.      Past Medical History  Diagnosis Date  . Seasonal allergies   . Hypertension   . Anxiety   . Depressive disorder   . Asthma   . Osteoarthritis   . DVT (deep venous thrombosis)   . Glaucoma   . Breast cancer, right 1996    right  . Breast cancer 2012    left  . Unspecified pruritic disorder   . Olecranon bursitis   . Other disorders of bone and cartilage(733.99)    Past Surgical History  Procedure Laterality Date  . Colonoscopy  2011-last  . Appendectomy    . Tonsillectomy    . Breast lumpectomy  1996    Dr. Corliss Marcus in [place]  . Breast lumpectomy  2011    Dr. Marisue Humble  . Rotator cuff repair  2007   Social History:   reports that she has never smoked. She has never used smokeless tobacco. She reports that she drinks about 6.0 ounces of alcohol per week. She reports that she does not use illicit drugs.  Family History  Problem Relation Age of Onset  . Stroke Father 10  . Heart disease Brother   . Colon cancer Maternal Aunt 70    Medications: Patient's Medications  New Prescriptions   No medications on file  Previous Medications   BIOTIN PO    Take 1 tablet by mouth daily.   BUDESONIDE (PULMICORT) 180 MCG/ACT INHALER  Inhale 2 puffs into the lungs 2 (two) times daily.   BUDESONIDE (RHINOCORT AQUA) 32 MCG/ACT NASAL SPRAY    Place 1 spray into the nose daily.   CHOLECALCIFEROL (VITAMIN D PO)    Take 1 tablet by mouth daily.   CLONAZEPAM (KLONOPIN) 0.5 MG TABLET    Take 0.5 mg by mouth at bedtime as needed.   HYDROCHLOROTHIAZIDE (HYDRODIURIL) 25 MG TABLET    TAKE 1 TABLET DAILY   HYDROCHLOROTHIAZIDE (HYDRODIURIL) 50 MG TABLET    Take 50 mg by mouth daily.   TAMOXIFEN (NOLVADEX) 20 MG TABLET    Take 1 tablet (20 mg total) by mouth daily.   TRAVOPROST, BENZALKONIUM, (TRAVATAN) 0.004 % OPHTHALMIC SOLUTION     Place 1 drop into both eyes at bedtime.   TRAZODONE (DESYREL) 150 MG TABLET    Take 150 mg by mouth at bedtime.   VENLAFAXINE HCL (EFFEXOR PO)    Take 150 mg by mouth.  Modified Medications   No medications on file  Discontinued Medications   No medications on file     Physical Exam Filed Vitals:   06/16/13 1004  BP: 124/78  Pulse: 76  Temp: 98.4 F (36.9 C)  TempSrc: Oral  Resp: 14  Height: 5\' 3"  (1.6 m)  Weight: 113 lb 12.8 oz (51.619 kg)   Physical Exam  Constitutional: She is oriented to person, place, and time. She appears well-developed and well-nourished. No distress.  HENT:  Head: Normocephalic and atraumatic.  Right Ear: External ear normal.  Left Ear: External ear normal.  Nose: Nose normal.  Mouth/Throat: Oropharynx is clear and moist. No oropharyngeal exudate.  Eyes: Conjunctivae and EOM are normal. Pupils are equal, round, and reactive to light. No scleral icterus.  Neck: Normal range of motion. Neck supple. No JVD present. No tracheal deviation present. No thyromegaly present.  Cardiovascular: Normal rate, regular rhythm, normal heart sounds and intact distal pulses.  Exam reveals no gallop and no friction rub.   No murmur heard. Pulmonary/Chest: Effort normal and breath sounds normal. No stridor. No respiratory distress. She has no wheezes. She has no rales. She exhibits no tenderness.  Bilateral breats exam normal, with well healed surgical scars, no nipple inversion or drainage, no new skin changes  Abdominal: Soft. Bowel sounds are normal. She exhibits no distension and no mass. There is no tenderness. There is no rebound and no guarding.  Musculoskeletal: Normal range of motion. She exhibits no edema and no tenderness.  Lymphadenopathy:    She has no cervical adenopathy.  Neurological: She is alert and oriented to person, place, and time. She has normal reflexes. No cranial nerve deficit. Coordination normal.  Skin: Skin is warm and dry. No rash noted. She  is not diaphoretic. No erythema. No pallor.  Psychiatric: She has a normal mood and affect. Her behavior is normal. Thought content normal.    Labs reviewed: Basic Metabolic Panel:  Recent Labs  40/98/11 1419 03/01/13 1359  NA 140 139  K 4.0 3.8  CL 105 101  CO2 28 28  GLUCOSE 87 96  BUN 17.0 21.4  CREATININE 0.7 0.7  CALCIUM 9.5 9.1   Liver Function Tests:  Recent Labs  09/28/12 1419 03/01/13 1359  AST 17 20  ALT 20 13  ALKPHOS 58 56  BILITOT 0.64 0.47  PROT 6.5 7.0  ALBUMIN 4.1 3.8   CBC:  Recent Labs  09/28/12 1419 03/01/13 1359  WBC 5.9 7.6  NEUTROABS 3.9 6.0  HGB 14.0 13.6  HCT 41.1 41.3  MCV 91.7 90.5  PLT 240 245   06/14/13  Wbc 7.3, hb 12.9, hct 38.9, plt 244, glu 93, bun 12, cr 0.58, na 140, k 3.7, cl 100, 29, lft wnl, tsh 4.410, vit d 52.8  EKG: Independently reviewed. NSR 80/min, LAD  Assessment/Plan  General exam- Gyn referral for pap smear and pelvic exam. Provide Pneumococcal vaccine and schedule dexa scan. uptodate with rest of screening and labs and immunization. Diet and exercise counselling provided  Depression- stable currently with effexor and trazodone. Sees Dr Tomasa Rand  Glaucoma- uptodate with eye visit. Continue travoprost eye drops  Osteopenia- continue vit d. Will get dexa scan  Breast cancer- continue tamoxifen and to follow with oncology. Notes reviewed  Anxiety- continue klonopin for now prn and monitor clinically  Asthma- continue breathing treatment for now and monitor symptoms. Early signs of exacerbation explained  Labs/tests ordered- bmp

## 2013-06-28 ENCOUNTER — Encounter: Payer: Self-pay | Admitting: Internal Medicine

## 2013-09-06 ENCOUNTER — Telehealth: Payer: Self-pay | Admitting: Oncology

## 2013-09-06 ENCOUNTER — Other Ambulatory Visit: Payer: Self-pay | Admitting: Internal Medicine

## 2013-09-06 ENCOUNTER — Ambulatory Visit: Payer: Medicare Other | Admitting: Oncology

## 2013-09-06 ENCOUNTER — Other Ambulatory Visit: Payer: Medicare Other | Admitting: Lab

## 2013-09-13 ENCOUNTER — Other Ambulatory Visit: Payer: Medicare Other | Admitting: Lab

## 2013-09-13 ENCOUNTER — Ambulatory Visit: Payer: Medicare Other | Admitting: Oncology

## 2013-10-14 ENCOUNTER — Ambulatory Visit
Admission: RE | Admit: 2013-10-14 | Discharge: 2013-10-14 | Disposition: A | Discharge status: Home / Self Care | Payer: Medicare Other | Source: Ambulatory Visit | Attending: Internal Medicine | Admitting: Internal Medicine

## 2013-10-14 DIAGNOSIS — Z853 Personal history of malignant neoplasm of breast: Secondary | ICD-10-CM

## 2013-10-15 ENCOUNTER — Other Ambulatory Visit: Payer: Self-pay | Admitting: *Deleted

## 2013-10-15 DIAGNOSIS — C50919 Malignant neoplasm of unspecified site of unspecified female breast: Secondary | ICD-10-CM

## 2013-10-18 ENCOUNTER — Ambulatory Visit (HOSPITAL_BASED_OUTPATIENT_CLINIC_OR_DEPARTMENT_OTHER): Payer: Medicare Other | Admitting: Oncology

## 2013-10-18 ENCOUNTER — Telehealth: Payer: Self-pay | Admitting: Oncology

## 2013-10-18 ENCOUNTER — Encounter: Payer: Self-pay | Admitting: Oncology

## 2013-10-18 ENCOUNTER — Encounter (INDEPENDENT_AMBULATORY_CARE_PROVIDER_SITE_OTHER): Payer: Self-pay

## 2013-10-18 ENCOUNTER — Other Ambulatory Visit (HOSPITAL_BASED_OUTPATIENT_CLINIC_OR_DEPARTMENT_OTHER): Payer: Medicare Other | Admitting: Lab

## 2013-10-18 VITALS — BP 129/77 | HR 105 | Temp 98.6°F | Resp 18 | Ht 63.0 in | Wt 115.6 lb

## 2013-10-18 DIAGNOSIS — D0590 Unspecified type of carcinoma in situ of unspecified breast: Secondary | ICD-10-CM

## 2013-10-18 DIAGNOSIS — C50919 Malignant neoplasm of unspecified site of unspecified female breast: Secondary | ICD-10-CM

## 2013-10-18 DIAGNOSIS — Z170 Estrogen receptor positive status [ER+]: Secondary | ICD-10-CM

## 2013-10-18 DIAGNOSIS — C50911 Malignant neoplasm of unspecified site of right female breast: Secondary | ICD-10-CM

## 2013-10-18 LAB — CBC WITH DIFFERENTIAL/PLATELET
BASO%: 0.6 % (ref 0.0–2.0)
Basophils Absolute: 0 10*3/uL (ref 0.0–0.1)
EOS%: 2.1 % (ref 0.0–7.0)
Eosinophils Absolute: 0.1 10*3/uL (ref 0.0–0.5)
HCT: 43.7 % (ref 34.8–46.6)
HGB: 14.4 g/dL (ref 11.6–15.9)
LYMPH%: 15 % (ref 14.0–49.7)
MCH: 30.2 pg (ref 25.1–34.0)
MCHC: 32.9 g/dL (ref 31.5–36.0)
MCV: 91.8 fL (ref 79.5–101.0)
MONO#: 0.7 10*3/uL (ref 0.1–0.9)
MONO%: 11.2 % (ref 0.0–14.0)
NEUT#: 4.7 10*3/uL (ref 1.5–6.5)
NEUT%: 71.1 % (ref 38.4–76.8)
Platelets: 242 10*3/uL (ref 145–400)
RBC: 4.76 10*6/uL (ref 3.70–5.45)
RDW: 13.8 % (ref 11.2–14.5)
WBC: 6.6 10*3/uL (ref 3.9–10.3)
lymph#: 1 10*3/uL (ref 0.9–3.3)

## 2013-10-18 LAB — COMPREHENSIVE METABOLIC PANEL (CC13)
ALT: 19 U/L (ref 0–55)
AST: 22 U/L (ref 5–34)
Albumin: 3.8 g/dL (ref 3.5–5.0)
Alkaline Phosphatase: 58 U/L (ref 40–150)
Anion Gap: 10 mEq/L (ref 3–11)
BUN: 15.8 mg/dL (ref 7.0–26.0)
CO2: 29 mEq/L (ref 22–29)
Calcium: 9.4 mg/dL (ref 8.4–10.4)
Chloride: 100 mEq/L (ref 98–109)
Creatinine: 0.7 mg/dL (ref 0.6–1.1)
Glucose: 97 mg/dl (ref 70–140)
Potassium: 4.1 mEq/L (ref 3.5–5.1)
Sodium: 139 mEq/L (ref 136–145)
Total Bilirubin: 0.5 mg/dL (ref 0.20–1.20)
Total Protein: 6.9 g/dL (ref 6.4–8.3)

## 2013-10-18 MED ORDER — TAMOXIFEN CITRATE 20 MG PO TABS: 20.0000 mg | ORAL_TABLET | Freq: Every day | ORAL | Status: DC

## 2013-10-18 NOTE — Progress Notes (Signed)
OFFICE PROGRESS NOTE  CC  Jo Grout, MD [address] [place] [address]  DIAGNOSIS: 67 year old female with prior history of breast cancer she underwent a lumpectomy and chemoradiation therapy. 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 She underwent a right lumpectomy with excellent lymph node dissection and chemotherapy consisting of 4 cycles of Adriamycin and Cytoxan followed by adjuvant radiation and 5 years of tamoxifen.  #2 in December 2010 patient had a routine screening mammogram that found abnormal calcifications with a stable right breast and new calcifications increased in number slightly at the 11:00 position in the anterior one third of the left breast. Without any mammographic mass. She had spot magnification views which described the calcifications to be indeterminant. 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 The DCIS measured 1.1 cm ER +99% PR weakly positive at 2%.  #3 patient had wire localized lumpectomy she tolerated the procedure well. She went on to have adjuvant radiation therapy to the left breast. She was then placed on tamoxifen 20 mg daily. All of her oncologic care was in [place]. She is now in [facility] I saw her for the first time on 09/28/2012.  CURRENT THERAPY: Tamoxifen 20 mg daily  INTERVAL HISTORY: Jo Morrison 67 y.o. female returns for followup visit at 6 months. She seems to be doing well she has really no side effects from tamoxifen except for night sweats. She denies any fevers chills night sweats headaches shortness of breath chest pains palpitations no myalgias and arthralgias no peripheral paresthesias.  She is very active. Remainder of the 10 point review of systems is negative.  MEDICAL HISTORY: Past Medical History  Diagnosis Date  . Seasonal allergies   . Hypertension   . Anxiety   . Depressive disorder   . Asthma   . Osteoarthritis   . DVT (deep venous thrombosis)   . Glaucoma   . Breast cancer, right 1996    right  . Breast cancer 2012    left  . Unspecified pruritic disorder   . Olecranon bursitis   . Other disorders of bone and cartilage(733.99)     ALLERGIES:  has No Known Allergies.  MEDICATIONS:  Current Outpatient Prescriptions  Medication Sig Dispense Refill  . BIOTIN PO Take 1 tablet by mouth daily.      . budesonide (PULMICORT) 180 MCG/ACT inhaler Inhale 2 puffs into the lungs 2 (two) times daily.      . budesonide (RHINOCORT AQUA) 32 MCG/ACT nasal spray Place 1 spray into the nose as needed.       . Cholecalciferol (VITAMIN D PO) Take 1 tablet by mouth daily.      . clonazePAM (KLONOPIN) 0.5 MG tablet Take 0.5 mg by mouth at bedtime as needed.      . hydrochlorothiazide (HYDRODIURIL) 25 MG tablet Take 1 tablet (25 mg total) by mouth daily.  90 tablet  3  . tamoxifen (NOLVADEX) 20 MG tablet Take 1 tablet (20 mg total) by mouth daily.  90 tablet  12  . travoprost, benzalkonium, (TRAVATAN) 0.004 % ophthalmic solution Place 1 drop into both eyes at bedtime.      . traZODone (DESYREL) 150 MG tablet Take 150 mg  by mouth at bedtime.      . Venlafaxine HCl (EFFEXOR PO) Take 150 mg by mouth.       No current facility-administered medications for this visit.    SURGICAL HISTORY:  Past Surgical History  Procedure Laterality Date  . Colonoscopy  2011-last  . Appendectomy    . Tonsillectomy    . Breast lumpectomy  1996    Dr. Corliss Marcus in [place]  . Breast lumpectomy  2011    Dr. Marisue Humble  . Rotator cuff repair  2007    REVIEW OF SYSTEMS:  Pertinent items are noted in HPI.   HEALTH MAINTENANCE:  PHYSICAL EXAMINATION: Blood pressure 129/77, pulse 105,  temperature 98.6 F (37 C), temperature source Oral, resp. rate 18, height 5\' 3"  (1.6 m), weight 115 lb 9.6 oz (52.436 kg). Body mass index is 20.48 kg/(m^2). ECOG PERFORMANCE STATUS: 0 - Asymptomatic  HEENT exam EOMI PERRLA sclerae anicteric no conjunctival pallor oral mucosa is moist neck is supple lungs are clear bilaterally to auscultation and percussion cardiovascular is regular rate rhythm abdomen is soft nontender nondistended bowel sounds are present no HSM extremities no edema neuro patient's alert oriented otherwise nonfocal Bilateral breast examination: Bilaterally well-healed surgical scars no nipple inversion discharge no skin changes no evidence of local recurrence.   LABORATORY DATA: Lab Results  Component Value Date   WBC 6.6 10/18/2013   HGB 14.4 10/18/2013   HCT 43.7 10/18/2013   MCV 91.8 10/18/2013   PLT 242 10/18/2013      Chemistry      Component Value Date/Time   NA 139 03/01/2013 1359   K 3.8 03/01/2013 1359   CL 101 03/01/2013 1359   CO2 28 03/01/2013 1359   BUN 21.4 03/01/2013 1359   CREATININE 0.7 03/01/2013 1359      Component Value Date/Time   CALCIUM 9.1 03/01/2013 1359   ALKPHOS 56 03/01/2013 1359   AST 20 03/01/2013 1359   ALT 13 03/01/2013 1359   BILITOT 0.47 03/01/2013 1359       RADIOGRAPHIC STUDIES:  No results found.  ASSESSMENT: 67 year old female with  #1 bilateral breast cancers diagnosed in 1996 and 2010. Left breast with DCIS diagnosed in 2010 ER PR positive. Right breast with invasive ductal carcinoma diagnosed in 1996 treated with 4 cycles of a.c. followed by adjuvant radiation and 5 years of tamoxifen. Patient now without any evidence of local recurrence.  #2 patient is on chemoprevention with tamoxifen for her recent DCIS. She is tolerating it well. Mammograms are up to date.   PLAN:   #1 patient will continue tamoxifen 20 mg daily.  #2 she'll be seen back in 6 months time for followup.   All questions were answered. The patient knows to  call the clinic with any problems, questions or concerns. We can certainly see the patient much sooner if necessary.  I spent 25 minutes counseling the patient face to face. The total time spent in the appointment was 30 minutes.    Drue Second, MD Medical/Oncology [facility] [phone] (beeper) [phone] (Office)  10/18/2013, 3:03 PM

## 2013-12-13 ENCOUNTER — Other Ambulatory Visit: Payer: Medicare Other

## 2013-12-13 DIAGNOSIS — I10 Essential (primary) hypertension: Secondary | ICD-10-CM

## 2013-12-14 LAB — BASIC METABOLIC PANEL
BUN/Creatinine Ratio: 20 (ref 11–26)
BUN: 14 mg/dL (ref 8–27)
CO2: 26 mmol/L (ref 18–29)
Calcium: 9.4 mg/dL (ref 8.7–10.3)
Chloride: 100 mmol/L (ref 97–108)
Creatinine, Ser: 0.7 mg/dL (ref 0.57–1.00)
GFR calc Af Amer: 104 mL/min/{1.73_m2} (ref >59)
GFR calc non Af Amer: 90 mL/min/{1.73_m2} (ref >59)
Glucose: 101 mg/dL — ABNORMAL HIGH (ref 65–99)
Potassium: 3.5 mmol/L (ref 3.5–5.2)
Sodium: 144 mmol/L (ref 134–144)

## 2013-12-15 ENCOUNTER — Ambulatory Visit (INDEPENDENT_AMBULATORY_CARE_PROVIDER_SITE_OTHER): Payer: Medicare Other | Admitting: Internal Medicine

## 2013-12-15 ENCOUNTER — Encounter: Payer: Self-pay | Admitting: Internal Medicine

## 2013-12-15 VITALS — BP 122/80 | HR 89 | Temp 99.2°F | Wt 119.0 lb

## 2013-12-15 DIAGNOSIS — I10 Essential (primary) hypertension: Secondary | ICD-10-CM

## 2013-12-15 DIAGNOSIS — F32A Depression, unspecified: Secondary | ICD-10-CM

## 2013-12-15 DIAGNOSIS — H409 Unspecified glaucoma: Secondary | ICD-10-CM

## 2013-12-15 DIAGNOSIS — M1990 Unspecified osteoarthritis, unspecified site: Secondary | ICD-10-CM

## 2013-12-15 DIAGNOSIS — J45909 Unspecified asthma, uncomplicated: Secondary | ICD-10-CM

## 2013-12-15 DIAGNOSIS — F3289 Other specified depressive episodes: Secondary | ICD-10-CM

## 2013-12-15 DIAGNOSIS — C50911 Malignant neoplasm of unspecified site of right female breast: Secondary | ICD-10-CM

## 2013-12-15 DIAGNOSIS — C50919 Malignant neoplasm of unspecified site of unspecified female breast: Secondary | ICD-10-CM

## 2013-12-15 DIAGNOSIS — F329 Major depressive disorder, single episode, unspecified: Secondary | ICD-10-CM

## 2013-12-15 MED ORDER — BUDESONIDE 32 MCG/ACT NA SUSP: NASAL | Status: DC

## 2013-12-15 MED ORDER — TETANUS-DIPHTH-ACELL PERTUSSIS 5-2-15.5 LF-MCG/0.5 IM SUSP: 0.5000 mL | Freq: Once | INTRAMUSCULAR | Status: DC

## 2013-12-15 MED ORDER — HYDROCHLOROTHIAZIDE 25 MG PO TABS: 25.0000 mg | ORAL_TABLET | Freq: Every day | ORAL | Status: DC

## 2013-12-15 NOTE — Progress Notes (Signed)
Patient ID: Jo Morrison, female   DOB: 10/02/1946, 68 y.o.   MRN: [id number]    Chief Complaint  Patient presents with  . Medical Managment of Chronic Issues    6 month follow-up    No Known Allergies  HPI 68 y/o female pt here for routine follow up She saw orthopedic in nov 2014 for right outer thigh pain and was diagnosed with bursitis, received cortisone injection and is doing her exercises and pain is under control Reviewed notes from her oncologist No concerns this visit Compliant with her medications  Review of Systems  Constitutional: Negative for fever, chills, weight loss, malaise/fatigue and diaphoresis.        Has night sweats but has been on tamoxifen  HENT: Negative for hearing loss, congestion, sore throat and tinnitus.   Eyes: Negative for blurred vision, pain and discharge.        Wears corrective lenses, seen her eye doctor in december 2014. On travoprost for her glaucoma  Respiratory: Negative for cough, sputum production, shortness of breath and wheezing.   Cardiovascular: Negative for chest pain, palpitations, orthopnea, claudication and leg swelling.  Gastrointestinal: Negative for heartburn, nausea, vomiting, abdominal pain, diarrhea, constipation and blood in stool.        Had colonoscopy with Dr Carlean Purl and a sessile polyp was removed, benign findings, repeat in 5 years recommended -2019 Genitourinary: Negative for dysuria, urgency, frequency and hematuria.   Musculoskeletal: Negative for myalgias, joint pain and falls.        Arthritis pain ocassionally  Skin: Negative for itching and rash.  Neurological: Negative for dizziness, tingling, tremors, sensory change, focal weakness, seizures, loss of consciousness, weakness and headaches.  Endo/Heme/Allergies: Negative for polydipsia.  Psychiatric/Behavioral: Negative for hallucinations and memory loss. The patient is not nervous/anxious and does not have insomnia.  taking her antidepressant and anxiety  medication  Past Medical History  Diagnosis Date  . Seasonal allergies   . Hypertension   . Anxiety   . Depressive disorder   . Asthma   . Osteoarthritis   . DVT (deep venous thrombosis)   . Glaucoma   . Breast cancer, right 1996    right  . Breast cancer 2012    left  . Unspecified pruritic disorder   . Olecranon bursitis   . Other disorders of bone and cartilage(733.99)    Current Outpatient Prescriptions on File Prior to Visit  Medication Sig Dispense Refill  . BIOTIN PO Take 1 tablet by mouth daily.      . budesonide (PULMICORT) 180 MCG/ACT inhaler Inhale 2 puffs into the lungs 2 (two) times daily.      . Cholecalciferol (VITAMIN D PO) Take 1 tablet by mouth daily.      . clonazePAM (KLONOPIN) 0.5 MG tablet Take 0.5 mg by mouth at bedtime as needed.      . tamoxifen (NOLVADEX) 20 MG tablet Take 1 tablet (20 mg total) by mouth daily.  90 tablet  12  . travoprost, benzalkonium, (TRAVATAN) 0.004 % ophthalmic solution Place 1 drop into both eyes at bedtime.      . traZODone (DESYREL) 150 MG tablet Take 150 mg by mouth at bedtime.      . Venlafaxine HCl (EFFEXOR PO) Take 150 mg by mouth.       No current facility-administered medications on file prior to visit.     Physical Exam  BP 122/80  Pulse 89  Temp(Src) 99.2 F (37.3 C) (Oral)  Wt 119 lb (53.978 kg)  SpO2 95%  Constitutional: She is oriented to person, place, and time. She appears well-developed and well-nourished. No distress.  HENT:   Head: Normocephalic and atraumatic.  Right Ear: External ear normal.  Left Ear: External ear normal.   Nose: Nose normal.   Mouth/Throat: Oropharynx is clear and moist. No oropharyngeal exudate.  Eyes: Conjunctivae and EOM are normal. Pupils are equal, round, and reactive to light. No scleral icterus.  Neck: Normal range of motion. Neck supple. No JVD present. No tracheal deviation present. No thyromegaly present.  Cardiovascular: Normal rate, regular rhythm, normal heart  sounds and intact distal pulses.  Exam reveals no gallop and no friction rub.    No murmur heard. Pulmonary/Chest: Effort normal and breath sounds normal. No stridor. No respiratory distress. She has no wheezes. She has no rales.  Abdominal: Soft. Bowel sounds are normal. She exhibits no distension and no mass. There is no tenderness. There is no rebound and no guarding.  Musculoskeletal: Normal range of motion. She exhibits no edema and no tenderness.  Lymphadenopathy:    She has no cervical adenopathy.  Neurological: She is alert and oriented to person, place, and time. She has normal reflexes. No cranial nerve deficit. Coordination normal.  Skin: Skin is warm and dry. No rash noted. She is not diaphoretic. No erythema. No pallor.  Psychiatric: She has a normal mood and affect. Her behavior is normal. Thought content normal.   Labs- CBC    Component Value Date/Time   WBC 6.6 10/18/2013 1419   RBC 4.76 10/18/2013 1419   HGB 14.4 10/18/2013 1419   HCT 43.7 10/18/2013 1419   PLT 242 10/18/2013 1419   MCV 91.8 10/18/2013 1419   MCH 30.2 10/18/2013 1419   MCHC 32.9 10/18/2013 1419   RDW 13.8 10/18/2013 1419   LYMPHSABS 1.0 10/18/2013 1419   MONOABS 0.7 10/18/2013 1419   EOSABS 0.1 10/18/2013 1419   BASOSABS 0.0 10/18/2013 1419    CMP     Component Value Date/Time   NA 144 12/13/2013 0931   NA 139 10/18/2013 1419   K 3.5 12/13/2013 0931   K 4.1 10/18/2013 1419   CL 100 12/13/2013 0931   CL 101 03/01/2013 1359   CO2 26 12/13/2013 0931   CO2 29 10/18/2013 1419   GLUCOSE 101* 12/13/2013 0931   GLUCOSE 97 10/18/2013 1419   GLUCOSE 96 03/01/2013 1359   BUN 14 12/13/2013 0931   BUN 15.8 10/18/2013 1419   CREATININE 0.70 12/13/2013 0931   CREATININE 0.7 10/18/2013 1419   CALCIUM 9.4 12/13/2013 0931   CALCIUM 9.4 10/18/2013 1419   PROT 6.9 10/18/2013 1419   ALBUMIN 3.8 10/18/2013 1419   AST 22 10/18/2013 1419   ALT 19 10/18/2013 1419   ALKPHOS 58 10/18/2013 1419   BILITOT 0.50  10/18/2013 1419   GFRNONAA 90 12/13/2013 0931   GFRAA 104 12/13/2013 0931   Assessment/plan  1. Hypertension Continue hctz at current dose, reviewed bmp. Dietary and exercise counselling provided - CMP; Future - CBC with Differential; Future - Lipid Panel; Future  2. Asthma Stable, no changes made to her breathing rx. No recent exacerbation. Refills on her med provided  3. Osteoarthritis Continue her vit d supplement for now. Encouraged weight bearing exercises  4. Glaucoma Continue eye drop, follows with ophtha  5. Depressive disorder Stable. Continue venlafaine and trazodone  6. Breast cancer, right Stable, continue tamoxifen

## 2014-04-12 ENCOUNTER — Telehealth: Payer: Self-pay | Admitting: Oncology

## 2014-04-12 NOTE — Telephone Encounter (Signed)
, °

## 2014-04-25 ENCOUNTER — Ambulatory Visit: Payer: Medicare Other | Admitting: Oncology

## 2014-04-25 ENCOUNTER — Other Ambulatory Visit: Payer: Medicare Other

## 2014-04-29 ENCOUNTER — Encounter: Payer: Self-pay | Admitting: Physician Assistant

## 2014-04-29 ENCOUNTER — Other Ambulatory Visit (HOSPITAL_BASED_OUTPATIENT_CLINIC_OR_DEPARTMENT_OTHER): Payer: Medicare Other

## 2014-04-29 ENCOUNTER — Ambulatory Visit (HOSPITAL_BASED_OUTPATIENT_CLINIC_OR_DEPARTMENT_OTHER): Payer: Medicare Other | Admitting: Physician Assistant

## 2014-04-29 VITALS — BP 147/71 | HR 99 | Temp 98.7°F | Resp 18 | Ht 63.0 in | Wt 115.1 lb

## 2014-04-29 DIAGNOSIS — C50911 Malignant neoplasm of unspecified site of right female breast: Secondary | ICD-10-CM

## 2014-04-29 DIAGNOSIS — D0590 Unspecified type of carcinoma in situ of unspecified breast: Secondary | ICD-10-CM

## 2014-04-29 DIAGNOSIS — Z853 Personal history of malignant neoplasm of breast: Secondary | ICD-10-CM

## 2014-04-29 LAB — CBC WITH DIFFERENTIAL/PLATELET
BASO%: 0.1 % (ref 0.0–2.0)
Basophils Absolute: 0 10*3/uL (ref 0.0–0.1)
EOS%: 1 % (ref 0.0–7.0)
Eosinophils Absolute: 0.1 10*3/uL (ref 0.0–0.5)
HCT: 39.4 % (ref 34.8–46.6)
HGB: 13.2 g/dL (ref 11.6–15.9)
LYMPH%: 12.9 % — ABNORMAL LOW (ref 14.0–49.7)
MCH: 30.4 pg (ref 25.1–34.0)
MCHC: 33.5 g/dL (ref 31.5–36.0)
MCV: 90.8 fL (ref 79.5–101.0)
MONO#: 0.6 10*3/uL (ref 0.1–0.9)
MONO%: 7.7 % (ref 0.0–14.0)
NEUT#: 6.3 10*3/uL (ref 1.5–6.5)
NEUT%: 78.3 % — ABNORMAL HIGH (ref 38.4–76.8)
Platelets: 201 10*3/uL (ref 145–400)
RBC: 4.34 10*6/uL (ref 3.70–5.45)
RDW: 13.4 % (ref 11.2–14.5)
WBC: 8 10*3/uL (ref 3.9–10.3)
lymph#: 1 10*3/uL (ref 0.9–3.3)

## 2014-04-29 LAB — COMPREHENSIVE METABOLIC PANEL (CC13)
ALT: 16 U/L (ref 0–55)
AST: 18 U/L (ref 5–34)
Albumin: 3.8 g/dL (ref 3.5–5.0)
Alkaline Phosphatase: 48 U/L (ref 40–150)
Anion Gap: 15 mEq/L — ABNORMAL HIGH (ref 3–11)
BUN: 15.8 mg/dL (ref 7.0–26.0)
CO2: 23 mEq/L (ref 22–29)
Calcium: 8.8 mg/dL (ref 8.4–10.4)
Chloride: 103 mEq/L (ref 98–109)
Creatinine: 1 mg/dL (ref 0.6–1.1)
Glucose: 110 mg/dl (ref 70–140)
Potassium: 3.6 mEq/L (ref 3.5–5.1)
Sodium: 141 mEq/L (ref 136–145)
Total Bilirubin: 0.75 mg/dL (ref 0.20–1.20)
Total Protein: 6.3 g/dL — ABNORMAL LOW (ref 6.4–8.3)

## 2014-04-29 MED ORDER — TAMOXIFEN CITRATE 20 MG PO TABS: 20.0000 mg | ORAL_TABLET | Freq: Every day | ORAL | Status: DC

## 2014-04-29 NOTE — Progress Notes (Signed)
OFFICE PROGRESS NOTE  CC  [name], MD [place] [address]  DIAGNOSIS: 68 year old female with prior history of breast cancer she underwent a lumpectomy and chemoradiation therapy. 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 She underwent a right lumpectomy with excellent lymph node dissection and chemotherapy consisting of 4 cycles of Adriamycin and Cytoxan followed by adjuvant radiation and 5 years of tamoxifen.  #2 in December 2010 patient had a routine screening mammogram that found abnormal calcifications with a stable right breast and new calcifications increased in number slightly at the 11:00 position in the anterior one third of the left breast. Without any mammographic mass. She had spot magnification views which described the calcifications to be indeterminant. 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 The DCIS measured 1.1 cm ER +99% PR weakly positive at 2%.  #3 patient had wire localized lumpectomy she tolerated the procedure well. She went on to have adjuvant radiation therapy to the left breast. She was then placed on tamoxifen 20 mg daily. All of her oncologic care was in [place]. She is now in [facility] I saw her for the first time on 09/28/2012.  CURRENT THERAPY: Tamoxifen 20 mg daily  INTERVAL HISTORY: Jo Morrison 68 y.o. female returns for followup visit at 6 months. She continues to do well and voices no specific complaints today. She is tolerating the tamoxifen without side effects except for some night sweats.  She denies any fevers chills night sweats headaches shortness of breath chest pains palpitations no  myalgias and arthralgias no peripheral paresthesias. She is very active. She is looking forward to enjoying the vegetables from her garden. Remainder of the 10 point review of systems is negative.  MEDICAL HISTORY: Past Medical History  Diagnosis Date  . Seasonal allergies   . Hypertension   . Anxiety   . Depressive disorder   . Asthma   . Osteoarthritis   . DVT (deep venous thrombosis)   . Glaucoma   . Breast cancer, right 1996    right  . Breast cancer 2012    left  . Unspecified pruritic disorder   . Olecranon bursitis   . Other disorders of bone and cartilage(733.99)     ALLERGIES:  has No Known Allergies.  MEDICATIONS:  Current Outpatient Prescriptions  Medication Sig Dispense Refill  . BIOTIN PO Take 1 tablet by mouth daily.      . budesonide (PULMICORT) 180 MCG/ACT inhaler Inhale 2 puffs into the lungs 2 (two) times daily.      . budesonide (RHINOCORT AQUA) 32 MCG/ACT nasal spray Place 1 spray into the nose as needed.  1 Bottle  3  . Cholecalciferol (VITAMIN D PO) Take 1 tablet by mouth daily.      . clonazePAM (KLONOPIN) 0.5 MG tablet Take 0.5 mg by mouth at bedtime as needed.      . hydrochlorothiazide (HYDRODIURIL) 25 MG tablet Take 1 tablet (25 mg total) by mouth daily.  90 tablet  3  . tamoxifen (NOLVADEX) 20 MG tablet Take 1 tablet (20 mg total) by mouth daily.  90 tablet  1  . travoprost, benzalkonium, (TRAVATAN) 0.004 % ophthalmic solution Place 1 drop into  both eyes at bedtime.      . traZODone (DESYREL) 150 MG tablet Take 150 mg by mouth at bedtime.      . Venlafaxine HCl (EFFEXOR PO) Take 150 mg by mouth.      . Tdap (ADACEL) 03-26-14.5 LF-MCG/0.5 injection Inject 0.5 mLs into the muscle once.  0.5 mL  0   No current facility-administered medications for this visit.    SURGICAL HISTORY:  Past Surgical History  Procedure Laterality Date  . Colonoscopy  2011-last  . Appendectomy  1958  . Tonsillectomy  1967  . Breast lumpectomy  1996    Dr. Corena Herter  in [place]  . Breast lumpectomy  2011    Dr. Pleas Patricia  . Rotator cuff repair  2007    REVIEW OF SYSTEMS:  Pertinent items are noted in HPI.   HEALTH MAINTENANCE:  PHYSICAL EXAMINATION: Blood pressure 147/71, pulse 99, temperature 98.7 F (37.1 C), temperature source Oral, resp. rate 18, height 5\' 3"  (1.6 m), weight 115 lb 1.6 oz (52.209 kg), SpO2 100.00%. Body mass index is 20.39 kg/(m^2).  ECOG PERFORMANCE STATUS: 0 - Asymptomatic  PHYSICAL EXAMINATION: General appearance: alert, cooperative, appears stated age and no distress Head: Normocephalic, without obvious abnormality, atraumatic Neck: no adenopathy, no carotid bruit, no JVD, supple, symmetrical, trachea midline and thyroid not enlarged, symmetric, no tenderness/mass/nodules Lymph nodes: Cervical, supraclavicular, and axillary nodes normal. Resp: clear to auscultation bilaterally Back: symmetric, no curvature. ROM normal. No CVA tenderness. Cardio: regular rate and rhythm, S1, S2 normal, no murmur, click, rub or gallop GI: soft, non-tender; bowel sounds normal; no masses,  no organomegaly Extremities: extremities normal, atraumatic, no cyanosis or edema Neurologic: Alert and oriented X 3, normal strength and tone. Normal symmetric reflexes. Normal coordination and gait Bilateral breast examination: Bilaterally well-healed surgical scars no nipple inversion discharge no skin changes no evidence of local recurrence.   LABORATORY DATA: Lab Results  Component Value Date   WBC 8.0 04/29/2014   HGB 13.2 04/29/2014   HCT 39.4 04/29/2014   MCV 90.8 04/29/2014   PLT 201 04/29/2014      Chemistry      Component Value Date/Time   NA 141 04/29/2014 1315   NA 144 12/13/2013 0931   K 3.6 04/29/2014 1315   K 3.5 12/13/2013 0931   CL 100 12/13/2013 0931   CL 101 03/01/2013 1359   CO2 23 04/29/2014 1315   CO2 26 12/13/2013 0931   BUN 15.8 04/29/2014 1315   BUN 14 12/13/2013 0931   CREATININE 1.0 04/29/2014 1315   CREATININE 0.70 12/13/2013 0931       Component Value Date/Time   CALCIUM 8.8 04/29/2014 1315   CALCIUM 9.4 12/13/2013 0931   ALKPHOS 48 04/29/2014 1315   AST 18 04/29/2014 1315   ALT 16 04/29/2014 1315   BILITOT 0.75 04/29/2014 1315       RADIOGRAPHIC STUDIES: Mammogram 10/18/2013 revealed no mammographic evidence of malignancy   ASSESSMENT: 68 year old female with  #1 bilateral breast cancers diagnosed in 1996 and 2010. Left breast with DCIS diagnosed in 2010 ER PR positive. Right breast with invasive ductal carcinoma diagnosed in 1996 treated with 4 cycles of a.c. followed by adjuvant radiation and 5 years of tamoxifen. Patient now without any evidence of local recurrence.  #2 patient is on chemoprevention with tamoxifen for her recent DCIS. She is tolerating it well. Mammograms are up to date.   PLAN:   #1 patient will continue tamoxifen 20 mg daily.  #2  she'll be seen back in 6 months time for followup. She is to get her next mammogram in November 2015 or early December 2015  All questions were answered. The patient knows to call the clinic with any problems, questions or concerns. We can certainly see the patient much sooner if necessary.  I spent 25 minutes counseling the patient face to face. The total time spent in the appointment was 30 minutes.    Carlton Adam, PA-C Medical/Oncology [facility] [phone] (Office) 04/29/2014, 4:42 PM

## 2014-05-02 ENCOUNTER — Telehealth: Payer: Self-pay | Admitting: Oncology

## 2014-05-02 NOTE — Telephone Encounter (Signed)
lvm for pt  regarding to Dec appt....amiled pt appt sched/avs an letter

## 2014-05-02 NOTE — Patient Instructions (Signed)
Continue taking tamoxifen as prescribed Followup in 6 months

## 2014-06-09 ENCOUNTER — Other Ambulatory Visit: Payer: Medicare Other

## 2014-06-09 DIAGNOSIS — I10 Essential (primary) hypertension: Secondary | ICD-10-CM

## 2014-06-10 LAB — CBC WITH DIFFERENTIAL/PLATELET
Basophils Absolute: 0 10*3/uL (ref 0.0–0.2)
Basos: 1 %
Eos: 3 %
Eosinophils Absolute: 0.2 10*3/uL (ref 0.0–0.4)
HCT: 39.2 % (ref 34.0–46.6)
Hemoglobin: 13.6 g/dL (ref 11.1–15.9)
Immature Grans (Abs): 0 10*3/uL (ref 0.0–0.1)
Immature Granulocytes: 0 %
Lymphocytes Absolute: 1.3 10*3/uL (ref 0.7–3.1)
Lymphs: 23 %
MCH: 31.6 pg (ref 26.6–33.0)
MCHC: 34.7 g/dL (ref 31.5–35.7)
MCV: 91 fL (ref 79–97)
Monocytes Absolute: 0.5 10*3/uL (ref 0.1–0.9)
Monocytes: 9 %
Neutrophils Absolute: 3.5 10*3/uL (ref 1.4–7.0)
Neutrophils Relative %: 64 %
RBC: 4.31 x10E6/uL (ref 3.77–5.28)
RDW: 14.1 % (ref 12.3–15.4)
WBC: 5.6 10*3/uL (ref 3.4–10.8)

## 2014-06-10 LAB — LIPID PANEL
Chol/HDL Ratio: 2.3 ratio units (ref 0.0–4.4)
Cholesterol, Total: 167 mg/dL (ref 100–199)
HDL: 74 mg/dL (ref >39)
LDL Calculated: 63 mg/dL (ref 0–99)
Triglycerides: 150 mg/dL — ABNORMAL HIGH (ref 0–149)
VLDL Cholesterol Cal: 30 mg/dL (ref 5–40)

## 2014-06-10 LAB — COMPREHENSIVE METABOLIC PANEL
ALT: 14 IU/L (ref 0–32)
AST: 18 IU/L (ref 0–40)
Albumin/Globulin Ratio: 2.2 (ref 1.1–2.5)
Albumin: 4.3 g/dL (ref 3.6–4.8)
Alkaline Phosphatase: 51 IU/L (ref 39–117)
BUN/Creatinine Ratio: 21 (ref 11–26)
BUN: 15 mg/dL (ref 8–27)
CO2: 30 mmol/L — ABNORMAL HIGH (ref 18–29)
Calcium: 9.1 mg/dL (ref 8.7–10.3)
Chloride: 98 mmol/L (ref 97–108)
Creatinine, Ser: 0.7 mg/dL (ref 0.57–1.00)
GFR calc Af Amer: 103 mL/min/{1.73_m2} (ref >59)
GFR calc non Af Amer: 89 mL/min/{1.73_m2} (ref >59)
Globulin, Total: 2 g/dL (ref 1.5–4.5)
Glucose: 79 mg/dL (ref 65–99)
Potassium: 3.5 mmol/L (ref 3.5–5.2)
Sodium: 142 mmol/L (ref 134–144)
Total Bilirubin: 0.3 mg/dL (ref 0.0–1.2)
Total Protein: 6.3 g/dL (ref 6.0–8.5)

## 2014-06-14 ENCOUNTER — Encounter: Payer: Medicare Other | Admitting: Internal Medicine

## 2014-06-14 ENCOUNTER — Encounter: Payer: Self-pay | Admitting: Internal Medicine

## 2014-06-14 ENCOUNTER — Other Ambulatory Visit: Payer: Self-pay | Admitting: Internal Medicine

## 2014-06-14 ENCOUNTER — Ambulatory Visit (INDEPENDENT_AMBULATORY_CARE_PROVIDER_SITE_OTHER): Payer: Medicare Other | Admitting: Internal Medicine

## 2014-06-14 VITALS — BP 118/80 | HR 84 | Temp 98.3°F | Resp 10 | Ht 63.08 in | Wt 114.4 lb

## 2014-06-14 DIAGNOSIS — M25542 Pain in joints of left hand: Secondary | ICD-10-CM

## 2014-06-14 DIAGNOSIS — F32A Depression, unspecified: Secondary | ICD-10-CM

## 2014-06-14 DIAGNOSIS — J4520 Mild intermittent asthma, uncomplicated: Secondary | ICD-10-CM

## 2014-06-14 DIAGNOSIS — M159 Polyosteoarthritis, unspecified: Secondary | ICD-10-CM

## 2014-06-14 DIAGNOSIS — I10 Essential (primary) hypertension: Secondary | ICD-10-CM

## 2014-06-14 DIAGNOSIS — E2839 Other primary ovarian failure: Secondary | ICD-10-CM

## 2014-06-14 DIAGNOSIS — F329 Major depressive disorder, single episode, unspecified: Secondary | ICD-10-CM

## 2014-06-14 DIAGNOSIS — M948X9 Other specified disorders of cartilage, unspecified sites: Secondary | ICD-10-CM

## 2014-06-14 DIAGNOSIS — Z0000 Encounter for general adult medical examination without abnormal findings: Secondary | ICD-10-CM

## 2014-06-14 DIAGNOSIS — J45909 Unspecified asthma, uncomplicated: Secondary | ICD-10-CM

## 2014-06-14 DIAGNOSIS — M150 Primary generalized (osteo)arthritis: Secondary | ICD-10-CM

## 2014-06-14 DIAGNOSIS — F3289 Other specified depressive episodes: Secondary | ICD-10-CM

## 2014-06-14 DIAGNOSIS — F419 Anxiety disorder, unspecified: Secondary | ICD-10-CM

## 2014-06-14 DIAGNOSIS — C50911 Malignant neoplasm of unspecified site of right female breast: Secondary | ICD-10-CM

## 2014-06-14 DIAGNOSIS — C50919 Malignant neoplasm of unspecified site of unspecified female breast: Secondary | ICD-10-CM

## 2014-06-14 DIAGNOSIS — M8949 Other hypertrophic osteoarthropathy, multiple sites: Secondary | ICD-10-CM

## 2014-06-14 DIAGNOSIS — M25549 Pain in joints of unspecified hand: Secondary | ICD-10-CM

## 2014-06-14 DIAGNOSIS — F411 Generalized anxiety disorder: Secondary | ICD-10-CM

## 2014-06-14 LAB — SPECIMEN STATUS REPORT

## 2014-06-14 MED ORDER — NAPROXEN 500 MG PO TABS: 500.0000 mg | ORAL_TABLET | Freq: Two times a day (BID) | ORAL | Status: DC

## 2014-06-14 NOTE — Progress Notes (Signed)
Patient ID: Jo Morrison, female   DOB: Apr 06, 1946, 68 y.o.   MRN: [id number]    Chief Complaint  Patient presents with  . Annual Exam    Yearly exam, no pap (completed by GYN 09/2013-normal)   . Hand Problem    Examine left hand- arthritis    No Known Allergies  HPI 68 y/o female here for her annual exam. Has hx of breast cancer and is on tamoxifen. Mammogram due in 10/15. She has gyn appointment in 11/15. No dexa scan recently. uptodate with colonoscopy.  Has pain in her left hand and digits with some swelling in left middle MCP joint Asthma stable. Taking her meds as prescribed  Review of Systems  Constitutional: Negative for fever, chills, weight loss, malaise/fatigue and diaphoresis.  HENT: Negative for congestion, hearing loss and sore throat.   Eyes: Negative for blurred vision, double vision and discharge. uptodate with eye exam. Had laser surgery for glaucoma Respiratory: Negative for cough, sputum production, shortness of breath and wheezing.   Cardiovascular: Negative for chest pain, palpitations, orthopnea and leg swelling.  Gastrointestinal: Negative for heartburn, nausea, vomiting, abdominal pain, diarrhea and constipation.  Genitourinary: Negative for dysuria, urgency, frequency and flank pain.  Musculoskeletal: Negative for back pain, falls, joint pain and myalgias.  Skin: Negative for itching and rash.  Neurological:  Negative for dizziness, tingling, focal weakness and headaches.  Psychiatric/Behavioral: Negative for depression and memory loss. The patient is not nervous/anxious.    Past Medical History  Diagnosis Date  . Seasonal allergies   . Hypertension   . Anxiety   . Depressive disorder   . Asthma   . Osteoarthritis   . DVT (deep venous thrombosis)   . Glaucoma   . Breast cancer, right 1996    right  . Breast cancer 2012    left  . Unspecified pruritic disorder   . Olecranon bursitis   . Other disorders of bone and cartilage(733.99)    Past  Surgical History  Procedure Laterality Date  . Colonoscopy  2011-last  . Appendectomy  1958  . Tonsillectomy  1967  . Breast lumpectomy  1996    Dr. Corena Herter in [place]  . Breast lumpectomy  2011    Dr. Pleas Patricia  . Rotator cuff repair  2007   Current Outpatient Prescriptions on File Prior to Visit  Medication Sig Dispense Refill  . BIOTIN PO Take 1 tablet by mouth daily.      . budesonide (PULMICORT) 180 MCG/ACT inhaler Inhale 2 puffs into the lungs 2 (two) times daily.      . budesonide (RHINOCORT AQUA) 32 MCG/ACT nasal spray Place 1 spray into the nose as needed.  1 Bottle  3  . Cholecalciferol (VITAMIN D PO) Take 1 tablet by mouth daily.      . clonazePAM (KLONOPIN) 0.5 MG tablet Take 0.5 mg by mouth at bedtime as needed.      . hydrochlorothiazide (HYDRODIURIL) 25 MG tablet Take 1 tablet (25 mg total) by mouth daily.  90 tablet  3  . tamoxifen (NOLVADEX) 20 MG tablet Take 1 tablet (20 mg total) by mouth daily.  90 tablet  1  . travoprost, benzalkonium, (TRAVATAN) 0.004 % ophthalmic solution Place 1 drop into both eyes at bedtime.      . traZODone (DESYREL) 150 MG tablet Take 150 mg by mouth at bedtime.      . Venlafaxine HCl (EFFEXOR PO) Take 150 mg by mouth.      . Tdap (ADACEL)  03-26-14.5 LF-MCG/0.5 injection Inject 0.5 mLs into the muscle once.  0.5 mL  0   No current facility-administered medications on file prior to visit.   Family History  Problem Relation Age of Onset  . Stroke Father 78  . Heart disease Brother   . Colon cancer Maternal Aunt 70   History   Social History  . Marital Status: Unknown    Spouse Name: N/A    Number of Children: 0  . Years of Education: N/A   Occupational History  . Not on file.   Social History Main Topics  . Smoking status: Never Smoker   . Smokeless tobacco: Never Used  . Alcohol Use: 6.0 oz/week    10 Glasses of wine per week     Comment: 1-2 glasses wine daily   . Drug Use: No  . Sexual Activity: Not Currently   Other  Topics Concern  . Not on file   Social History Narrative   ** Merged History Encounter **       Two cups of coffee daily   PHYSICAL EXAM BP 118/80  Pulse 84  Temp(Src) 98.3 F (36.8 C) (Oral)  Resp 10  Ht 5' 3.08" (1.602 m)  Wt 114 lb 6.4 oz (51.891 kg)  BMI 20.22 kg/m2  SpO2 98%  General- elderly female in no acute distress Head- atraumatic, normocephalic Eyes- PERRLA, EOMI, no pallor, no icterus, no discharge. Has glasses Ears- left ear normal tympanic membrane and normal external ear canal , right ear normal tympanic membrane and normal external ear canal. Some cerumen noted Neck- no lymphadenopathy, no thyromegaly, no jugular vein distension, no carotid bruit Nose- normal nasal mucosa, no maxillary sinus tenderness, no frontal sinus tenderness Mouth- normal mucus membrane, no oral thrush, normal oropharynx Chest- no chest wall deformities, no chest wall tenderness Breast- done by gyn Cardiovascular- normal s1,s2, no murmurs/ rubs/ gallops, normal distal pulses Respiratory- bilateral clear to auscultation, no wheeze, no rhonchi, no crackles Abdomen- bowel sounds present, soft, non tender, no organomegaly, no abdominal bruits, no guarding or rigidity, no CVA tenderness Pelvic exam- done by gyn Musculoskeletal- able to move all 4 extremities, no spinal and paraspinal tenderness, steady gait, no use of assistive device, normal range of motion, no leg edema Neurological- no focal deficit, normal reflexes, normal muscle strength, normal sensation to fine touch and vibration Skin- warm and dry Psychiatry- alert and oriented to person, place and time, normal mood and affect  Labs-  CBC Latest Ref Rng 06/09/2014 04/29/2014 10/18/2013  WBC 3.4 - 10.8 x10E3/uL 5.6 8.0 6.6  Hemoglobin 11.1 - 15.9 g/dL 13.6 13.2 14.4  Hematocrit 34.0 - 46.6 % 39.2 39.4 43.7  Platelets 145 - 400 10e3/uL - 201 242   CMP     Component Value Date/Time   NA 142 06/09/2014 1030   NA 141 04/29/2014 1315    K 3.5 06/09/2014 1030   K 3.6 04/29/2014 1315   CL 98 06/09/2014 1030   CL 101 03/01/2013 1359   CO2 30* 06/09/2014 1030   CO2 23 04/29/2014 1315   GLUCOSE 79 06/09/2014 1030   GLUCOSE 110 04/29/2014 1315   GLUCOSE 96 03/01/2013 1359   BUN 15 06/09/2014 1030   BUN 15.8 04/29/2014 1315   CREATININE 0.70 06/09/2014 1030   CREATININE 1.0 04/29/2014 1315   CALCIUM 9.1 06/09/2014 1030   CALCIUM 8.8 04/29/2014 1315   PROT 6.3 06/09/2014 1030   PROT 6.3* 04/29/2014 1315   ALBUMIN 3.8 04/29/2014 1315   AST  18 06/09/2014 1030   AST 18 04/29/2014 1315   ALT 14 06/09/2014 1030   ALT 16 04/29/2014 1315   ALKPHOS 51 06/09/2014 1030   ALKPHOS 48 04/29/2014 1315   BILITOT 0.3 06/09/2014 1030   BILITOT 0.75 04/29/2014 1315   GFRNONAA 89 06/09/2014 1030   GFRAA 103 06/09/2014 1030   Lipid Panel     Component Value Date/Time   TRIG 150* 06/09/2014 1030   HDL 74 06/09/2014 1030   CHOLHDL 2.3 06/09/2014 1030   LDLCALC 63 06/09/2014 1030   Assessment/plan  1. Essential hypertension Stable, continue hctz current regimen  2. Asthma, mild intermittent, uncomplicated continue prn pulmicort  3. Primary osteoarthritis involving multiple joints Continue vit d, started on naproxen for a week - DG Bone Density; Future - DG Hand Complete Left; Future  4. Other disorders of bone and cartilage(733.99) Get dexa scan - DG Bone Density; Future  5. Breast cancer, right Continue tamoxifen  6. Depressive disorder Continue venlafaxine for now  7. Anxiety Stable with current regimen   8. Routine general medical examination at a health care facility the patient was counseled regarding the appropriate use of alcohol, regular self-examination of the breasts on a monthly basis, prevention of dental and periodontal disease, diet, regular sustained exercise for at least 30 minutes 5 times per week, routine screening interval for mammogram as recommended by the [place] and ACOG, the proper use of sunscreen and protective  clothing, tobacco use, and recommended schedule for GI hemoccult testing, colonoscopy, cholesterol, thyroid and diabetes screening. Check tsh today  Immunization History  Administered Date(s) Administered  . Influenza-Unspecified 08/25/2010, 09/15/2012  . Pneumococcal Polysaccharide-23 06/16/2013  . Tdap 11/25/1998    9. Joint pain in fingers of left hand Will get xray to rule out arthritis changes. Also check anticcp to rule out RA given joint deformity with radial deviation. To take naproxen 500 mg bid for now for a week and reassess if no improvement - DG Hand Complete Left; Future

## 2014-06-16 ENCOUNTER — Other Ambulatory Visit: Payer: Self-pay

## 2014-06-16 ENCOUNTER — Other Ambulatory Visit: Payer: Self-pay | Admitting: Internal Medicine

## 2014-06-16 ENCOUNTER — Ambulatory Visit
Admission: RE | Admit: 2014-06-16 | Discharge: 2014-06-16 | Disposition: A | Discharge status: Home / Self Care | Payer: Medicare Other | Source: Ambulatory Visit | Attending: Internal Medicine | Admitting: Internal Medicine

## 2014-06-16 DIAGNOSIS — M150 Primary generalized (osteo)arthritis: Principal | ICD-10-CM

## 2014-06-16 DIAGNOSIS — M25542 Pain in joints of left hand: Secondary | ICD-10-CM

## 2014-06-16 DIAGNOSIS — E2839 Other primary ovarian failure: Secondary | ICD-10-CM

## 2014-06-16 DIAGNOSIS — M8949 Other hypertrophic osteoarthropathy, multiple sites: Secondary | ICD-10-CM

## 2014-06-16 DIAGNOSIS — M159 Polyosteoarthritis, unspecified: Secondary | ICD-10-CM

## 2014-06-16 LAB — CYCLIC CITRUL PEPTIDE ANTIBODY, IGG/IGA: Cyclic Citrullin Peptide Ab: <1 units (ref 0–19)

## 2014-06-16 LAB — TSH: TSH: 2.92 u[IU]/mL (ref 0.450–4.500)

## 2014-06-16 LAB — SPECIMEN STATUS REPORT

## 2014-06-21 ENCOUNTER — Ambulatory Visit
Admission: RE | Admit: 2014-06-21 | Discharge: 2014-06-21 | Disposition: A | Discharge status: Home / Self Care | Payer: Medicare Other | Source: Ambulatory Visit | Attending: Internal Medicine | Admitting: Internal Medicine

## 2014-06-21 DIAGNOSIS — E2839 Other primary ovarian failure: Secondary | ICD-10-CM

## 2014-06-21 LAB — HM DEXA SCAN: HM Dexa Scan: NORMAL

## 2014-06-22 ENCOUNTER — Encounter: Payer: Self-pay | Admitting: *Deleted

## 2014-06-24 ENCOUNTER — Encounter: Payer: Self-pay | Admitting: *Deleted

## 2014-07-13 ENCOUNTER — Ambulatory Visit (INDEPENDENT_AMBULATORY_CARE_PROVIDER_SITE_OTHER): Payer: Medicare Other | Admitting: Internal Medicine

## 2014-07-13 ENCOUNTER — Encounter: Payer: Self-pay | Admitting: Internal Medicine

## 2014-07-13 VITALS — BP 130/80 | HR 100 | Temp 98.3°F | Resp 20 | Ht 63.0 in | Wt 115.6 lb

## 2014-07-13 DIAGNOSIS — M720 Palmar fascial fibromatosis [Dupuytren]: Secondary | ICD-10-CM

## 2014-07-13 NOTE — Progress Notes (Signed)
Patient ID: Jo Morrison, female   DOB: 26-Jan-1946, 68 y.o.   MRN: [id number]    Chief Complaint  Patient presents with  . Acute Visit    left hand swelling, discomfort   HPI 68 y/o female patient is here with pain and swelling of her left and right hand. She has noticed bumps in her pal and nodule in her knuckle of both hands along with limitation of extending her fingers. This is affecting her grip and thus her day to day activities. She recently had xray of her left hand for pain and changes noticed in her joints which shows DJD. She also had anti ccp to assess for early RA changes and this was negative.  ROS Denies pain or redness or swelling of wrist joints or finger PIP and DIP.  No chest pain or dyspnea No trauma No smoking  Past Medical History  Diagnosis Date  . Seasonal allergies   . Hypertension   . Anxiety   . Depressive disorder   . Asthma   . Osteoarthritis   . DVT (deep venous thrombosis)   . Glaucoma   . Breast cancer, right 1996    right  . Breast cancer 2012    left  . Unspecified pruritic disorder   . Olecranon bursitis   . Other disorders of bone and cartilage(733.99)    Medication reviewed. See [facility]  Physical exam BP 130/80  Pulse 100  Temp(Src) 98.3 F (36.8 C) (Oral)  Resp 20  Ht 5\' 3"  (1.6 m)  Wt 115 lb 9.6 oz (52.436 kg)  BMI 20.48 kg/m2  SpO2 98%  General- elderly female in no acute distress Head- atraumatic, normocephalic Neck- no lymphadenopathy Cardiovascular- normal s1,s2, no murmurs Respiratory- bilateral clear to auscultation Musculoskeletal- able to move all 4 extremities, thickening of skin on both palms. Palpable mobile nodule in both palm forming cords. Has palpable enlarged nodule in both the hand in 3rd knuckle only. No pain in the nodules. Some joint stifflness noted with MCP and DIP, able to do full extension with some help. Contracture in the palm is drawing lateral 3 fingers of both hands into flexion at MCP. Also some ulnar  devitation noted in both hands. Has a palpable ganglion in right wrist area. Good radial pulse and good circulation, normal temperature  Skin- warm and dry Psychiatry- alert and oriented to person, place and time, normal mood and affect  Assessment/plan  1. Contracture of palmar fascia (Dupuytren's) Likely has dupuytren's contracture. No pain on exam but given her limitation with mobility and grip of the hand, will refer to rheumatology for further assessment for steroid injection vs fasciotomy to help with symptom. Will not have her on any pain medication for now. Other differential is palmar fascitis but lack of pain makes this less likely for now

## 2014-09-21 ENCOUNTER — Other Ambulatory Visit: Payer: Self-pay | Admitting: Internal Medicine

## 2014-09-21 DIAGNOSIS — Z853 Personal history of malignant neoplasm of breast: Secondary | ICD-10-CM

## 2014-10-28 ENCOUNTER — Ambulatory Visit (HOSPITAL_BASED_OUTPATIENT_CLINIC_OR_DEPARTMENT_OTHER): Payer: Medicare Other | Admitting: Hematology and Oncology

## 2014-10-28 ENCOUNTER — Telehealth: Payer: Self-pay | Admitting: Hematology and Oncology

## 2014-10-28 ENCOUNTER — Other Ambulatory Visit (HOSPITAL_BASED_OUTPATIENT_CLINIC_OR_DEPARTMENT_OTHER): Payer: Medicare Other

## 2014-10-28 VITALS — BP 147/82 | HR 93 | Temp 98.1°F | Resp 19 | Ht 63.0 in | Wt 115.1 lb

## 2014-10-28 DIAGNOSIS — D0512 Intraductal carcinoma in situ of left breast: Secondary | ICD-10-CM | POA: Insufficient documentation

## 2014-10-28 DIAGNOSIS — C50911 Malignant neoplasm of unspecified site of right female breast: Secondary | ICD-10-CM

## 2014-10-28 DIAGNOSIS — Z853 Personal history of malignant neoplasm of breast: Secondary | ICD-10-CM

## 2014-10-28 LAB — CBC WITH DIFFERENTIAL/PLATELET
BASO%: 0.3 % (ref 0.0–2.0)
Basophils Absolute: 0 10*3/uL (ref 0.0–0.1)
EOS%: 2.2 % (ref 0.0–7.0)
Eosinophils Absolute: 0.2 10*3/uL (ref 0.0–0.5)
HCT: 40.7 % (ref 34.8–46.6)
HGB: 13.5 g/dL (ref 11.6–15.9)
LYMPH%: 21.3 % (ref 14.0–49.7)
MCH: 30.5 pg (ref 25.1–34.0)
MCHC: 33.2 g/dL (ref 31.5–36.0)
MCV: 91.9 fL (ref 79.5–101.0)
MONO#: 0.4 10*3/uL (ref 0.1–0.9)
MONO%: 5.6 % (ref 0.0–14.0)
NEUT#: 4.9 10*3/uL (ref 1.5–6.5)
NEUT%: 70.6 % (ref 38.4–76.8)
Platelets: 251 10*3/uL (ref 145–400)
RBC: 4.43 10*6/uL (ref 3.70–5.45)
RDW: 13.3 % (ref 11.2–14.5)
WBC: 7 10*3/uL (ref 3.9–10.3)
lymph#: 1.5 10*3/uL (ref 0.9–3.3)
nRBC: 0 % (ref 0–0)

## 2014-10-28 LAB — COMPREHENSIVE METABOLIC PANEL (CC13)
ALT: 16 U/L (ref 0–55)
AST: 16 U/L (ref 5–34)
Albumin: 3.7 g/dL (ref 3.5–5.0)
Alkaline Phosphatase: 51 U/L (ref 40–150)
Anion Gap: 9 mEq/L (ref 3–11)
BUN: 20.6 mg/dL (ref 7.0–26.0)
CO2: 29 mEq/L (ref 22–29)
Calcium: 9.2 mg/dL (ref 8.4–10.4)
Chloride: 102 mEq/L (ref 98–109)
Creatinine: 0.8 mg/dL (ref 0.6–1.1)
EGFR: 73 mL/min/{1.73_m2} — ABNORMAL LOW (ref >90)
Glucose: 120 mg/dl (ref 70–140)
Potassium: 3.7 mEq/L (ref 3.5–5.1)
Sodium: 140 mEq/L (ref 136–145)
Total Bilirubin: 0.46 mg/dL (ref 0.20–1.20)
Total Protein: 6.2 g/dL — ABNORMAL LOW (ref 6.4–8.3)

## 2014-10-28 MED ORDER — TAMOXIFEN CITRATE 20 MG PO TABS
20.0000 mg | ORAL_TABLET | Freq: Every day | ORAL | Status: DC
Start: 2014-10-28 — End: 2014-11-28

## 2014-10-28 NOTE — Telephone Encounter (Signed)
, °

## 2014-10-28 NOTE — Progress Notes (Signed)
Patient Care Team: [name], MD as PCP - General (Internal Medicine)  DIAGNOSIS: No matching staging information was found for the patient.  SUMMARY OF ONCOLOGIC HISTORY:   Breast cancer, right   10/29/1995 Surgery The right breast invasive ductal carcinoma [name] [name] status post right lumpectomy with axillary lymph node dissection and chemotherapy with 4 cycles of AC, adjuvant radiation and 5 years tamoxifen    CHIEF COMPLIANT: followup of breast cancer and DCIS  INTERVAL HISTORY: Jo Morrison is a 68 year old Caucasian with above-mentioned history of right-sided breast cancer diagnosed and treated in 30 in [place]. She developed in 2000 and left sided DCIS that was treated with lumpectomy and radiation and has been on oral antiestrogen therapy with tamoxifen. She is tolerating tamoxifen extremely well without any major problems other than occasional hot flashes and night sweats. She is complaining of severe pain in the right-sided ribs. For the past 2 weeks. She did not have any cough or expectoration. She did not bump into anything. This is constant in nature.  REVIEW OF SYSTEMS:   Constitutional: Denies fevers, chills or abnormal weight loss Eyes: Denies blurriness of vision Ears, nose, mouth, throat, and face: Denies mucositis or sore throat Respiratory:right-sided rib pain Cardiovascular: Denies palpitation, chest discomfort or lower extremity swelling Gastrointestinal:  Denies nausea, heartburn or change in bowel habits Skin: Denies abnormal skin rashes Lymphatics: Denies new lymphadenopathy or easy bruising Neurological:Denies numbness, tingling or new weaknesses Behavioral/Psych: Mood is stable, no new changes  Breast:  denies any pain or lumps or nodules in either breasts All other systems were reviewed with the patient and are negative.  I have reviewed the past medical history, past surgical history, social history and family history with the patient  and they are unchanged from previous note.  ALLERGIES:  has No Known Allergies.  MEDICATIONS:  Current Outpatient Prescriptions  Medication Sig Dispense Refill  . BIOTIN PO Take 1 tablet by mouth daily.    . budesonide (PULMICORT) 180 MCG/ACT inhaler Inhale 2 puffs into the lungs 2 (two) times daily.    . budesonide (RHINOCORT AQUA) 32 MCG/ACT nasal spray Place 1 spray into the nose as needed. 1 Bottle 3  . Cholecalciferol (VITAMIN D PO) Take 1 tablet by mouth daily.    . clonazePAM (KLONOPIN) 0.5 MG tablet Take 0.5 mg by mouth at bedtime as needed.    . hydrochlorothiazide (HYDRODIURIL) 25 MG tablet Take 1 tablet (25 mg total) by mouth daily. 90 tablet 3  . latanoprost (XALATAN) 0.005 % ophthalmic solution Place 1 drop into both eyes daily.    . tamoxifen (NOLVADEX) 20 MG tablet Take 1 tablet (20 mg total) by mouth daily. 90 tablet 3  . Tdap (ADACEL) 03-26-14.5 LF-MCG/0.5 injection Inject 0.5 mLs into the muscle once. 0.5 mL 0  . traZODone (DESYREL) 150 MG tablet Take 150 mg by mouth at bedtime.    . Venlafaxine HCl (EFFEXOR PO) Take 150 mg by mouth.     No current facility-administered medications for this visit.    PHYSICAL EXAMINATION: ECOG PERFORMANCE STATUS: 0 - Asymptomatic  Filed Vitals:   10/28/14 0931  BP: 147/82  Pulse: 93  Temp: 98.1 F (36.7 C)  Resp: 19   Filed Weights   10/28/14 0931  Weight: 115 lb 1.6 oz (52.209 kg)    GENERAL:alert, no distress and comfortable SKIN: skin color, texture, turgor are normal, no rashes or significant lesions EYES: normal, Conjunctiva are pink and non-injected, sclera clear OROPHARYNX:no exudate, no erythema  and lips, buccal mucosa, and tongue normal  NECK: supple, thyroid normal size, non-tender, without nodularity LYMPH:  no palpable lymphadenopathy in the cervical, axillary or inguinal LUNGS: clear to auscultation and percussion with normal breathing effort HEART: regular rate & rhythm and no murmurs and no lower extremity  edema ABDOMEN:abdomen soft, non-tender and normal bowel sounds Musculoskeletal:no cyanosis of digits and no clubbing  NEURO: alert & oriented x 3 with fluent speech, no focal motor/sensory deficits BREAST: No palpable masses or nodules in either right or left breasts. No palpable axillary supraclavicular or infraclavicular adenopathy no breast tenderness or nipple discharge.   LABORATORY DATA:  I have reviewed the data as listed   Chemistry      Component Value Date/Time   NA 142 06/09/2014 1030   NA 141 04/29/2014 1315   K 3.5 06/09/2014 1030   K 3.6 04/29/2014 1315   CL 98 06/09/2014 1030   CL 101 03/01/2013 1359   CO2 30* 06/09/2014 1030   CO2 23 04/29/2014 1315   BUN 15 06/09/2014 1030   BUN 15.8 04/29/2014 1315   CREATININE 0.70 06/09/2014 1030   CREATININE 1.0 04/29/2014 1315      Component Value Date/Time   CALCIUM 9.1 06/09/2014 1030   CALCIUM 8.8 04/29/2014 1315   ALKPHOS 51 06/09/2014 1030   ALKPHOS 48 04/29/2014 1315   AST 18 06/09/2014 1030   AST 18 04/29/2014 1315   ALT 14 06/09/2014 1030   ALT 16 04/29/2014 1315   BILITOT 0.3 06/09/2014 1030   BILITOT 0.75 04/29/2014 1315       Lab Results  Component Value Date   WBC 5.6 06/09/2014   HGB 13.6 06/09/2014   HCT 39.2 06/09/2014   MCV 91 06/09/2014   PLT 201 04/29/2014   NEUTROABS 3.5 06/09/2014    ASSESSMENT & PLAN:  Ductal carcinoma in situ (DCIS) of left breast Left breast lumpectomy revealed DCIS solid and cribriform types with comedonecrosis 1.1 cm [name], [name] status post lumpectomy and then placed on tamoxifen 20 mg daily. Her treatment was done at [place] moved to [facility] 09/28/2012  Tamoxifen toxicities:night sweats Surveillance: Today's breast exam was normal.mammograms are supposed to be done 10/31/2014. DEXA scan revealed normal bone density.patient complained of pain in the left-sided ribs. I will obtain a bone scan and will call her with the results of the  test.  Survivorship:Discussed the importance of physical exercise in decreasing the likelihood of breast cancer recurrence. Recommended 30 mins daily 6 days a week of either brisk walking or cycling or swimming. Encouraged patient to eat more fruits and vegetables and decrease red meat.    Breast cancer, right Right-sided breast cancer diagnosed 1996 treated with lumpectomy adjuvant chemotherapy with a.c. x4 followed by radiation and tamoxifen for 5 years Surveillance: Mammograms to be done December 2015. Today's breast exam was normal.   Orders Placed This Encounter  Procedures  . NM Bone Scan Whole Body    Standing Status: Future     Number of Occurrences:      Standing Expiration Date: 10/28/2015    Order Specific Question:  Reason for Exam (SYMPTOM  OR DIAGNOSIS REQUIRED)    Answer:  History of breast cancer with left-sided rib pain    Order Specific Question:  Preferred imaging location?    Answer:  [facility]  . CBC with Differential    Standing Status: Future     Number of Occurrences:      Standing Expiration Date: 10/28/2015  .  Comprehensive metabolic panel (Cmet) - [facility]    Standing Status: Future     Number of Occurrences:      Standing Expiration Date: 10/28/2015   The patient has a good understanding of the overall plan. she agrees with it. She will call with any problems that may develop before her next visit here.   Rulon Eisenmenger, MD 10/28/2014 10:15 AM

## 2014-10-28 NOTE — Assessment & Plan Note (Addendum)
Left breast lumpectomy revealed DCIS solid and cribriform types with comedonecrosis 1.1 cm [name], [name] status post lumpectomy and then placed on tamoxifen 20 mg daily. Her treatment was done at [place] moved to [facility] 09/28/2012  Tamoxifen toxicities:night sweats Surveillance: Today's breast exam was normal.mammograms are supposed to be done 10/31/2014. DEXA scan revealed normal bone density.patient complained of pain in the left-sided ribs. I will obtain a bone scan and will call her with the results of the test.  Survivorship:Discussed the importance of physical exercise in decreasing the likelihood of breast cancer recurrence. Recommended 30 mins daily 6 days a week of either brisk walking or cycling or swimming. Encouraged patient to eat more fruits and vegetables and decrease red meat.

## 2014-10-28 NOTE — Assessment & Plan Note (Signed)
Right-sided breast cancer diagnosed 1996 treated with lumpectomy adjuvant chemotherapy with a.c. x4 followed by radiation and tamoxifen for 5 years Surveillance: Mammograms to be done December 2015. Today's breast exam was normal.

## 2014-10-31 ENCOUNTER — Ambulatory Visit
Admission: RE | Admit: 2014-10-31 | Discharge: 2014-10-31 | Disposition: A | Discharge status: Home / Self Care | Payer: Medicare Other | Source: Ambulatory Visit | Attending: Internal Medicine | Admitting: Internal Medicine

## 2014-10-31 DIAGNOSIS — Z853 Personal history of malignant neoplasm of breast: Secondary | ICD-10-CM

## 2014-11-02 ENCOUNTER — Other Ambulatory Visit: Payer: Self-pay | Admitting: Orthopedic Surgery

## 2014-11-10 ENCOUNTER — Encounter (HOSPITAL_COMMUNITY): Payer: Medicare Other

## 2014-11-10 ENCOUNTER — Ambulatory Visit (HOSPITAL_COMMUNITY): Payer: Medicare Other

## 2014-11-11 ENCOUNTER — Encounter (HOSPITAL_COMMUNITY)
Admission: RE | Admit: 2014-11-11 | Discharge: 2014-11-11 | Disposition: A | Discharge status: Home / Self Care | Payer: Medicare Other | Source: Ambulatory Visit | Attending: Hematology and Oncology | Admitting: Hematology and Oncology

## 2014-11-11 ENCOUNTER — Ambulatory Visit (HOSPITAL_COMMUNITY)
Admission: RE | Admit: 2014-11-11 | Discharge: 2014-11-11 | Disposition: A | Discharge status: Home / Self Care | Payer: Medicare Other | Source: Ambulatory Visit | Attending: Hematology and Oncology | Admitting: Hematology and Oncology

## 2014-11-11 DIAGNOSIS — D0512 Intraductal carcinoma in situ of left breast: Secondary | ICD-10-CM

## 2014-11-11 DIAGNOSIS — R0781 Pleurodynia: Secondary | ICD-10-CM | POA: Insufficient documentation

## 2014-11-11 MED ORDER — TECHNETIUM TC 99M MEDRONATE IV KIT
25.5000 | PACK | Freq: Once | INTRAVENOUS | Status: AC | PRN
  Administered 2014-11-11: 25.5 via INTRAVENOUS

## 2014-11-21 ENCOUNTER — Other Ambulatory Visit: Payer: Self-pay | Admitting: Hematology and Oncology

## 2014-11-21 ENCOUNTER — Telehealth: Payer: Self-pay | Admitting: Hematology and Oncology

## 2014-11-21 DIAGNOSIS — C50911 Malignant neoplasm of unspecified site of right female breast: Secondary | ICD-10-CM

## 2014-11-21 NOTE — Telephone Encounter (Signed)
LM to confirm the Chest Xray she can come at any time this week. No Appt needed order is in.

## 2014-11-22 ENCOUNTER — Ambulatory Visit (HOSPITAL_COMMUNITY)
Admission: RE | Admit: 2014-11-22 | Discharge: 2014-11-22 | Disposition: A | Discharge status: Home / Self Care | Payer: Medicare Other | Source: Ambulatory Visit | Attending: Hematology and Oncology | Admitting: Hematology and Oncology

## 2014-11-22 ENCOUNTER — Other Ambulatory Visit: Payer: Self-pay | Admitting: Hematology and Oncology

## 2014-11-22 DIAGNOSIS — N63 Unspecified lump in breast: Secondary | ICD-10-CM | POA: Diagnosis not present

## 2014-11-22 DIAGNOSIS — Z853 Personal history of malignant neoplasm of breast: Secondary | ICD-10-CM | POA: Diagnosis not present

## 2014-11-22 DIAGNOSIS — R0781 Pleurodynia: Secondary | ICD-10-CM | POA: Diagnosis not present

## 2014-11-22 DIAGNOSIS — C50911 Malignant neoplasm of unspecified site of right female breast: Secondary | ICD-10-CM

## 2014-11-23 ENCOUNTER — Other Ambulatory Visit: Payer: Self-pay | Admitting: Hematology and Oncology

## 2014-11-23 DIAGNOSIS — C50911 Malignant neoplasm of unspecified site of right female breast: Secondary | ICD-10-CM

## 2014-11-28 ENCOUNTER — Encounter (HOSPITAL_COMMUNITY): Payer: Self-pay

## 2014-11-28 ENCOUNTER — Ambulatory Visit (HOSPITAL_COMMUNITY)
Admission: RE | Admit: 2014-11-28 | Discharge: 2014-11-28 | Disposition: A | Discharge status: Home / Self Care | Payer: Medicare Other | Source: Ambulatory Visit | Attending: Hematology and Oncology | Admitting: Hematology and Oncology

## 2014-11-28 ENCOUNTER — Other Ambulatory Visit (HOSPITAL_BASED_OUTPATIENT_CLINIC_OR_DEPARTMENT_OTHER): Payer: Medicare Other

## 2014-11-28 ENCOUNTER — Other Ambulatory Visit: Payer: Self-pay | Admitting: Oncology

## 2014-11-28 DIAGNOSIS — R1012 Left upper quadrant pain: Secondary | ICD-10-CM | POA: Insufficient documentation

## 2014-11-28 DIAGNOSIS — Z853 Personal history of malignant neoplasm of breast: Secondary | ICD-10-CM

## 2014-11-28 DIAGNOSIS — S2249XA Multiple fractures of ribs, unspecified side, initial encounter for closed fracture: Secondary | ICD-10-CM | POA: Diagnosis not present

## 2014-11-28 DIAGNOSIS — M19012 Primary osteoarthritis, left shoulder: Secondary | ICD-10-CM | POA: Insufficient documentation

## 2014-11-28 DIAGNOSIS — J8410 Pulmonary fibrosis, unspecified: Secondary | ICD-10-CM | POA: Diagnosis not present

## 2014-11-28 DIAGNOSIS — X58XXXA Exposure to other specified factors, initial encounter: Secondary | ICD-10-CM | POA: Insufficient documentation

## 2014-11-28 DIAGNOSIS — D0512 Intraductal carcinoma in situ of left breast: Secondary | ICD-10-CM

## 2014-11-28 DIAGNOSIS — I700 Atherosclerosis of aorta: Secondary | ICD-10-CM | POA: Diagnosis not present

## 2014-11-28 DIAGNOSIS — R938 Abnormal findings on diagnostic imaging of other specified body structures: Secondary | ICD-10-CM | POA: Diagnosis present

## 2014-11-28 DIAGNOSIS — C50911 Malignant neoplasm of unspecified site of right female breast: Secondary | ICD-10-CM

## 2014-11-28 DIAGNOSIS — M4184 Other forms of scoliosis, thoracic region: Secondary | ICD-10-CM | POA: Insufficient documentation

## 2014-11-28 DIAGNOSIS — Z923 Personal history of irradiation: Secondary | ICD-10-CM | POA: Insufficient documentation

## 2014-11-28 DIAGNOSIS — J984 Other disorders of lung: Secondary | ICD-10-CM | POA: Diagnosis not present

## 2014-11-28 LAB — COMPREHENSIVE METABOLIC PANEL (CC13)
ALT: 14 U/L (ref 0–55)
AST: 16 U/L (ref 5–34)
Albumin: 3.7 g/dL (ref 3.5–5.0)
Alkaline Phosphatase: 52 U/L (ref 40–150)
Anion Gap: 8 mEq/L (ref 3–11)
BUN: 13.7 mg/dL (ref 7.0–26.0)
CO2: 32 mEq/L — ABNORMAL HIGH (ref 22–29)
Calcium: 9 mg/dL (ref 8.4–10.4)
Chloride: 99 mEq/L (ref 98–109)
Creatinine: 0.8 mg/dL (ref 0.6–1.1)
EGFR: 81 mL/min/{1.73_m2} — ABNORMAL LOW (ref >90)
Glucose: 87 mg/dl (ref 70–140)
Potassium: 3.5 mEq/L (ref 3.5–5.1)
Sodium: 139 mEq/L (ref 136–145)
Total Bilirubin: 0.6 mg/dL (ref 0.20–1.20)
Total Protein: 6.2 g/dL — ABNORMAL LOW (ref 6.4–8.3)

## 2014-11-28 LAB — CBC WITH DIFFERENTIAL/PLATELET
BASO%: 0.6 % (ref 0.0–2.0)
Basophils Absolute: 0.1 10*3/uL (ref 0.0–0.1)
EOS%: 2 % (ref 0.0–7.0)
Eosinophils Absolute: 0.2 10*3/uL (ref 0.0–0.5)
HCT: 39.8 % (ref 34.8–46.6)
HGB: 12.9 g/dL (ref 11.6–15.9)
LYMPH%: 16.8 % (ref 14.0–49.7)
MCH: 29.5 pg (ref 25.1–34.0)
MCHC: 32.5 g/dL (ref 31.5–36.0)
MCV: 90.7 fL (ref 79.5–101.0)
MONO#: 0.7 10*3/uL (ref 0.1–0.9)
MONO%: 8.7 % (ref 0.0–14.0)
NEUT#: 6 10*3/uL (ref 1.5–6.5)
NEUT%: 71.9 % (ref 38.4–76.8)
Platelets: 210 10*3/uL (ref 145–400)
RBC: 4.39 10*6/uL (ref 3.70–5.45)
RDW: 13.4 % (ref 11.2–14.5)
WBC: 8.3 10*3/uL (ref 3.9–10.3)
lymph#: 1.4 10*3/uL (ref 0.9–3.3)

## 2014-11-28 MED ORDER — IOHEXOL 300 MG/ML  SOLN
80.0000 mL | Freq: Once | INTRAMUSCULAR | Status: AC | PRN
  Administered 2014-11-28: 80 mL via INTRAVENOUS

## 2014-12-13 ENCOUNTER — Ambulatory Visit (INDEPENDENT_AMBULATORY_CARE_PROVIDER_SITE_OTHER): Payer: Medicare Other | Admitting: Internal Medicine

## 2014-12-13 ENCOUNTER — Encounter: Payer: Self-pay | Admitting: Internal Medicine

## 2014-12-13 VITALS — BP 154/88 | HR 91 | Temp 98.4°F | Ht 63.0 in | Wt 114.0 lb

## 2014-12-13 DIAGNOSIS — Z23 Encounter for immunization: Secondary | ICD-10-CM

## 2014-12-13 DIAGNOSIS — J4520 Mild intermittent asthma, uncomplicated: Secondary | ICD-10-CM

## 2014-12-13 DIAGNOSIS — M720 Palmar fascial fibromatosis [Dupuytren]: Secondary | ICD-10-CM

## 2014-12-13 DIAGNOSIS — F32A Depression, unspecified: Secondary | ICD-10-CM

## 2014-12-13 DIAGNOSIS — I10 Essential (primary) hypertension: Secondary | ICD-10-CM

## 2014-12-13 DIAGNOSIS — F329 Major depressive disorder, single episode, unspecified: Secondary | ICD-10-CM

## 2014-12-13 DIAGNOSIS — D0512 Intraductal carcinoma in situ of left breast: Secondary | ICD-10-CM

## 2014-12-13 DIAGNOSIS — H409 Unspecified glaucoma: Secondary | ICD-10-CM

## 2014-12-13 NOTE — Progress Notes (Signed)
Patient ID: Jo Morrison, female   DOB: 1946/08/01, 69 y.o.   MRN: [id number]    Chief Complaint  Patient presents with  . Medical Management of Chronic Issues    6 Month Follow up. No Complaints   No Known Allergies  HPI 69 y/o female here for routine visit.  HTN- bp elevate don arrival to clinic. Recheck normal. Taking hctz. No concerns. Home bp readings 120-124/80   Asthma- stable  duptyren's contracture- underwent surgical repair, applying vit e oil to help with scar  Reviewed her dexa scan- normal 7/15  Breast cancer- on tamoxifen, f/u with oncology.  Glaucoma- using her eye drops  Review of Systems  Constitutional: Negative for fever, chills, weight loss, malaise/fatigue and diaphoresis.  HENT: Negative for congestion, hearing loss and sore throat.   Eyes: Negative for blurred vision, double vision and discharge. uptodate with eye exam. Had laser surgery for glaucoma Respiratory: Negative for cough, sputum production, shortness of breath and wheezing.   Cardiovascular: Negative for chest pain, palpitations, orthopnea and leg swelling.  Gastrointestinal: Negative for heartburn, nausea, vomiting, abdominal pain, diarrhea and constipation.  Genitourinary: Negative for dysuria, urgency, frequency and flank pain.  Musculoskeletal: Negative for back pain, falls, joint pain and myalgias.  Skin: Negative for itching and rash.  Neurological:  Negative for dizziness, tingling, focal weakness and headaches.  Psychiatric/Behavioral: Negative for depression and memory loss. The patient is not nervous/anxious.   Past Medical History  Diagnosis Date  . Seasonal allergies   . Hypertension   . Anxiety   . Depressive disorder   . Asthma   . Osteoarthritis   . DVT (deep venous thrombosis)   . Glaucoma   . Breast cancer, right 1996    right  . Breast cancer 2012    left  . Unspecified pruritic disorder   . Olecranon bursitis   . Other disorders of bone and cartilage(733.99)     Current Outpatient Prescriptions on File Prior to Visit  Medication Sig Dispense Refill  . BIOTIN PO Take 1 tablet by mouth daily.    . budesonide (PULMICORT) 180 MCG/ACT inhaler Inhale 2 puffs into the lungs 2 (two) times daily.    . budesonide (RHINOCORT AQUA) 32 MCG/ACT nasal spray Place 1 spray into the nose as needed. 1 Bottle 3  . Cholecalciferol (VITAMIN D PO) Take 1 tablet by mouth daily.    . clonazePAM (KLONOPIN) 0.5 MG tablet Take 0.5 mg by mouth at bedtime as needed.    . hydrochlorothiazide (HYDRODIURIL) 25 MG tablet Take 1 tablet (25 mg total) by mouth daily. 90 tablet 3  . latanoprost (XALATAN) 0.005 % ophthalmic solution Place 1 drop into both eyes daily.    . tamoxifen (NOLVADEX) 20 MG tablet TAKE 1 TABLET BY MOUTH DAILY 90 tablet 0  . Tdap (ADACEL) 03-26-14.5 LF-MCG/0.5 injection Inject 0.5 mLs into the muscle once. 0.5 mL 0  . traZODone (DESYREL) 150 MG tablet Take 150 mg by mouth at bedtime.     No current facility-administered medications on file prior to visit.    Immunization History  Administered Date(s) Administered  . Influenza-Unspecified 08/25/2010, 09/15/2012  . Pneumococcal Polysaccharide-23 06/16/2013  . Tdap 11/25/1998  . Zoster 05/27/2012   Physical exam BP 154/88 mmHg  Pulse 91  Temp(Src) 98.4 F (36.9 C) (Oral)  Ht 5\' 3"  (1.6 m)  Wt 114 lb (51.71 kg)  BMI 20.20 kg/m2  Recheck 128/76  General- elderly female in no acute distress Head- atraumatic, normocephalic Eyes- PERRLA, EOMI, no  pallor, no icterus, no discharge. Has glasses Neck- no lymphadenopathy Mouth- normal mucus membrane Cardiovascular- normal s1,s2, no murmurs, normal distal pulses Respiratory- bilateral clear to auscultation, no wheeze, no rhonchi, no crackles Abdomen- bowel sounds present, soft, non tender Musculoskeletal- able to move all 4 extremities, no leg edema, some contracture noted in left hand, right hand has surigcal scar, able to straighten her palm Neurological-  no focal deficit Skin- warm and dry Psychiatry- alert and oriented to person, place and time, normal mood and affect  Labs- CBC Latest Ref Rng 11/28/2014 10/28/2014 06/09/2014  WBC 3.9 - 10.3 10e3/uL 8.3 7.0 5.6  Hemoglobin 11.6 - 15.9 g/dL 12.9 13.5 13.6  Hematocrit 34.8 - 46.6 % 39.8 40.7 39.2  Platelets 145 - 400 10e3/uL 210 251 -   CMP Latest Ref Rng 11/28/2014 10/28/2014 06/09/2014  Glucose 70 - 140 mg/dl 87 120 79  BUN 7.0 - 26.0 mg/dL 13.7 20.6 15  Creatinine 0.6 - 1.1 mg/dL 0.8 0.8 0.70  Sodium 136 - 145 mEq/L 139 140 142  Potassium 3.5 - 5.1 mEq/L 3.5 3.7 3.5  Chloride 97 - 108 mmol/L - - 98  CO2 22 - 29 mEq/L 32(H) 29 30(H)  Calcium 8.4 - 10.4 mg/dL 9.0 9.2 9.1  Total Protein 6.4 - 8.3 g/dL 6.2(L) 6.2(L) 6.3  Albumin 3.6 - 4.8 g/dL - - 4.3  Total Bilirubin 0.20 - 1.20 mg/dL 0.60 0.46 0.3  Alkaline Phos 40 - 150 U/L 52 51 51  AST 5 - 34 U/L 16 16 18   ALT 0 - 55 U/L 14 16 14     Assessment/plan  1. Essential hypertension Continue hctz, monitor bmp, dietary and exercise counselling provided - CMP; Future - Lipid Panel; Future - CBC with Differential; Future - TSH; Future  2. Asthma, mild intermittent, uncomplicated Stable, continue prn pulmicort  3. Contracture of palmar fascia (Dupuytren's) Improved post surgery in right hand  4. Ductal carcinoma in situ (DCIS) of left breast Continue tamoxifen for now, recent mammogram normal, f/u with oncology  5. Glaucoma Continue current eye drops- latanoprost  6. Depressive disorder Stable, continue effexor, trazodone and klonopin, f/u with psych services - TSH; Future  7. Need for vaccination with 13-polyvalent pneumococcal conjugate vaccine - Pneumococcal conjugate vaccine 13-valent

## 2015-02-20 ENCOUNTER — Other Ambulatory Visit: Payer: Self-pay

## 2015-02-20 MED ORDER — HYDROCHLOROTHIAZIDE 25 MG PO TABS: 25.0000 mg | ORAL_TABLET | Freq: Every day | ORAL | Status: DC

## 2015-02-20 NOTE — Telephone Encounter (Signed)
When she saw Dr. Bubba Camp on 12/13/14 she was to fax refill on HCTZ, neither pharmacy has it. Wants called to [facility] 90 day supply with 3 refills.

## 2015-03-16 ENCOUNTER — Telehealth: Payer: Self-pay | Admitting: General Practice

## 2015-05-15 NOTE — Telephone Encounter (Signed)
Pts request noted...[facility]

## 2015-08-10 ENCOUNTER — Encounter: Payer: Self-pay | Admitting: Internal Medicine

## 2015-08-10 ENCOUNTER — Ambulatory Visit (INDEPENDENT_AMBULATORY_CARE_PROVIDER_SITE_OTHER): Payer: Medicare Other | Admitting: Internal Medicine

## 2015-08-10 ENCOUNTER — Other Ambulatory Visit (INDEPENDENT_AMBULATORY_CARE_PROVIDER_SITE_OTHER): Payer: Medicare Other

## 2015-08-10 VITALS — BP 132/82 | HR 81 | Temp 98.4°F | Resp 14 | Ht 62.0 in | Wt 116.0 lb

## 2015-08-10 DIAGNOSIS — I10 Essential (primary) hypertension: Secondary | ICD-10-CM

## 2015-08-10 DIAGNOSIS — Z0000 Encounter for general adult medical examination without abnormal findings: Secondary | ICD-10-CM

## 2015-08-10 DIAGNOSIS — J4520 Mild intermittent asthma, uncomplicated: Secondary | ICD-10-CM | POA: Diagnosis not present

## 2015-08-10 DIAGNOSIS — F419 Anxiety disorder, unspecified: Secondary | ICD-10-CM | POA: Diagnosis not present

## 2015-08-10 DIAGNOSIS — Z23 Encounter for immunization: Secondary | ICD-10-CM | POA: Diagnosis not present

## 2015-08-10 LAB — COMPREHENSIVE METABOLIC PANEL
ALT: 14 U/L (ref 0–35)
AST: 17 U/L (ref 0–37)
Albumin: 4 g/dL (ref 3.5–5.2)
Alkaline Phosphatase: 48 U/L (ref 39–117)
BUN: 14 mg/dL (ref 6–23)
CO2: 31 mEq/L (ref 19–32)
Calcium: 9.5 mg/dL (ref 8.4–10.5)
Chloride: 102 mEq/L (ref 96–112)
Creatinine, Ser: 0.69 mg/dL (ref 0.40–1.20)
GFR: 89.52 mL/min (ref >60.00)
Glucose, Bld: 80 mg/dL (ref 70–99)
Potassium: 3.9 mEq/L (ref 3.5–5.1)
Sodium: 142 mEq/L (ref 135–145)
Total Bilirubin: 0.6 mg/dL (ref 0.2–1.2)
Total Protein: 6.8 g/dL (ref 6.0–8.3)

## 2015-08-10 LAB — LIPID PANEL
Cholesterol: 147 mg/dL (ref 0–200)
HDL: 76.7 mg/dL (ref >39.00)
LDL Cholesterol: 55 mg/dL (ref 0–99)
NonHDL: 70.05
Total CHOL/HDL Ratio: 2
Triglycerides: 74 mg/dL (ref 0.0–149.0)
VLDL: 14.8 mg/dL (ref 0.0–40.0)

## 2015-08-10 MED ORDER — ALBUTEROL SULFATE 108 (90 BASE) MCG/ACT IN AEPB: 2.0000 | INHALATION_SPRAY | RESPIRATORY_TRACT | Status: DC | PRN

## 2015-08-10 MED ORDER — HYDROCHLOROTHIAZIDE 25 MG PO TABS: 25.0000 mg | ORAL_TABLET | Freq: Every day | ORAL | Status: DC

## 2015-08-10 NOTE — Assessment & Plan Note (Signed)
Controlled with effexor and rare clonazepam usage. Does not need refill of clonazepam today.

## 2015-08-10 NOTE — Progress Notes (Signed)
Pre visit review using our clinic review tool, if applicable. No additional management support is needed unless otherwise documented below in the visit note. 

## 2015-08-10 NOTE — Assessment & Plan Note (Signed)
Mild intermittent and controlled on pulmicort. Does not want to go down on dosing but likely could step down given lack of use of her albuterol and no flares in the last year. Rx for albuterol inhaler given today.

## 2015-08-10 NOTE — Patient Instructions (Signed)
We have given you the prescription for the albuterol inhaler and a coupon for a free inhaler which you can take to your pharmacy.   We have given you the flu shot and the tetanus shot today and will check the blood work and call you back with the results.   Health Maintenance Adopting a healthy lifestyle and getting preventive care can go a long way to promote health and wellness. Talk with your health care provider about what schedule of regular examinations is right for you. This is a good chance for you to check in with your provider about disease prevention and staying healthy. In between checkups, there are plenty of things you can do on your own. Experts have done a lot of research about which lifestyle changes and preventive measures are most likely to keep you healthy. Ask your health care provider for more information. WEIGHT AND DIET  Eat a healthy diet  Be sure to include plenty of vegetables, fruits, low-fat dairy products, and lean protein.  Do not eat a lot of foods high in solid fats, added sugars, or salt.  Get regular exercise. This is one of the most important things you can do for your health.  Most adults should exercise for at least 150 minutes each week. The exercise should increase your heart rate and make you sweat (moderate-intensity exercise).  Most adults should also do strengthening exercises at least twice a week. This is in addition to the moderate-intensity exercise.  Maintain a healthy weight  Body mass index (BMI) is a measurement that can be used to identify possible weight problems. It estimates body fat based on height and weight. Your health care provider can help determine your BMI and help you achieve or maintain a healthy weight.  For females 80 years of age and older:   A BMI below 18.5 is considered underweight.  A BMI of 18.5 to 24.9 is normal.  A BMI of 25 to 29.9 is considered overweight.  A BMI of 30 and above is considered obese.   Watch levels of cholesterol and blood lipids  You should start having your blood tested for lipids and cholesterol at 69 years of age, then have this test every 5 years.  You may need to have your cholesterol levels checked more often if:  Your lipid or cholesterol levels are high.  You are older than 69 years of age.  You are at high risk for heart disease.  CANCER SCREENING   Lung Cancer  Lung cancer screening is recommended for adults 41-42 years old who are at high risk for lung cancer because of a history of smoking.  A yearly low-dose CT scan of the lungs is recommended for people who:  Currently smoke.  Have quit within the past 15 years.  Have at least a 30-pack-year history of smoking. A pack year is smoking an average of one pack of cigarettes a day for 1 year.  Yearly screening should continue until it has been 15 years since you quit.  Yearly screening should stop if you develop a health problem that would prevent you from having lung cancer treatment.  Breast Cancer  Practice breast self-awareness. This means understanding how your breasts normally appear and feel.  It also means doing regular breast self-exams. Let your health care provider know about any changes, no matter how small.  If you are in your 20s or 30s, you should have a clinical breast exam (CBE) by a health care provider every  provider every 1-3 years as part of a regular health exam.  If you are 40 or older, have a CBE every year. Also consider having a breast X-ray (mammogram) every year.  If you have a family history of breast cancer, talk to your health care provider about genetic screening.  If you are at high risk for breast cancer, talk to your health care provider about having an MRI and a mammogram every year.  Breast cancer gene (BRCA) assessment is recommended for women who have family members with BRCA-related cancers. BRCA-related cancers  include:  Breast.  Ovarian.  Tubal.  Peritoneal cancers.  Results of the assessment will determine the need for genetic counseling and BRCA1 and BRCA2 testing. Cervical Cancer Routine pelvic examinations to screen for cervical cancer are no longer recommended for nonpregnant women who are considered low risk for cancer of the pelvic organs (ovaries, uterus, and vagina) and who do not have symptoms. A pelvic examination may be necessary if you have symptoms including those associated with pelvic infections. Ask your health care provider if a screening pelvic exam is right for you.   The Pap test is the screening test for cervical cancer for women who are considered at risk.  If you had a hysterectomy for a problem that was not cancer or a condition that could lead to cancer, then you no longer need Pap tests.  If you are older than 65 years, and you have had normal Pap tests for the past 10 years, you no longer need to have Pap tests.  If you have had past treatment for cervical cancer or a condition that could lead to cancer, you need Pap tests and screening for cancer for at least 20 years after your treatment.  If you no longer get a Pap test, assess your risk factors if they change (such as having a new sexual partner). This can affect whether you should start being screened again.  Some women have medical problems that increase their chance of getting cervical cancer. If this is the case for you, your health care provider may recommend more frequent screening and Pap tests.  The human papillomavirus (HPV) test is another test that may be used for cervical cancer screening. The HPV test looks for the virus that can cause cell changes in the cervix. The cells collected during the Pap test can be tested for HPV.  The HPV test can be used to screen women 30 years of age and older. Getting tested for HPV can extend the interval between normal Pap tests from three to five years.  An HPV  test also should be used to screen women of any age who have unclear Pap test results.  After 69 years of age, women should have HPV testing as often as Pap tests.  Colorectal Cancer  This type of cancer can be detected and often prevented.  Routine colorectal cancer screening usually begins at 69 years of age and continues through 69 years of age.  Your health care provider may recommend screening at an earlier age if you have risk factors for colon cancer.  Your health care provider may also recommend using home test kits to check for hidden blood in the stool.  A small camera at the end of a tube can be used to examine your colon directly (sigmoidoscopy or colonoscopy). This is done to check for the earliest forms of colorectal cancer.  Routine screening usually begins at age 50.  Direct examination of the colon   should be repeated every 5-10 years through 69 years of age. However, you may need to be screened more often if early forms of precancerous polyps or small growths are found. Skin Cancer  Check your skin from head to toe regularly.  Tell your health care provider about any new moles or changes in moles, especially if there is a change in a mole's shape or color.  Also tell your health care provider if you have a mole that is larger than the size of a pencil eraser.  Always use sunscreen. Apply sunscreen liberally and repeatedly throughout the day.  Protect yourself by wearing long sleeves, pants, a wide-brimmed hat, and sunglasses whenever you are outside. HEART DISEASE, DIABETES, AND HIGH BLOOD PRESSURE   Have your blood pressure checked at least every 1-2 years. High blood pressure causes heart disease and increases the risk of stroke.  If you are between 55 years and 79 years old, ask your health care provider if you should take aspirin to prevent strokes.  Have regular diabetes screenings. This involves taking a blood sample to check your fasting blood sugar  level.  If you are at a normal weight and have a low risk for diabetes, have this test once every three years after 69 years of age.  If you are overweight and have a high risk for diabetes, consider being tested at a younger age or more often. PREVENTING INFECTION  Hepatitis B  If you have a higher risk for hepatitis B, you should be screened for this virus. You are considered at high risk for hepatitis B if:  You were born in a country where hepatitis B is common. Ask your health care provider which countries are considered high risk.  Your parents were born in a high-risk country, and you have not been immunized against hepatitis B (hepatitis B vaccine).  You have HIV or AIDS.  You use needles to inject street drugs.  You live with someone who has hepatitis B.  You have had sex with someone who has hepatitis B.  You get hemodialysis treatment.  You take certain medicines for conditions, including cancer, organ transplantation, and autoimmune conditions. Hepatitis C  Blood testing is recommended for:  Everyone born from 1945 through 1965.  Anyone with known risk factors for hepatitis C. Sexually transmitted infections (STIs)  You should be screened for sexually transmitted infections (STIs) including gonorrhea and chlamydia if:  You are sexually active and are younger than 69 years of age.  You are older than 69 years of age and your health care provider tells you that you are at risk for this type of infection.  Your sexual activity has changed since you were last screened and you are at an increased risk for chlamydia or gonorrhea. Ask your health care provider if you are at risk.  If you do not have HIV, but are at risk, it may be recommended that you take a prescription medicine daily to prevent HIV infection. This is called pre-exposure prophylaxis (PrEP). You are considered at risk if:  You are sexually active and do not regularly use condoms or know the HIV status  of your partner(s).  You take drugs by injection.  You are sexually active with a partner who has HIV. Talk with your health care provider about whether you are at high risk of being infected with HIV. If you choose to begin PrEP, you should first be tested for HIV. You should then be tested every 3 months for   as long as you are taking PrEP.  PREGNANCY   If you are premenopausal and you may become pregnant, ask your health care provider about preconception counseling.  If you may become pregnant, take 400 to 800 micrograms (mcg) of folic acid every day.  If you want to prevent pregnancy, talk to your health care provider about birth control (contraception). OSTEOPOROSIS AND MENOPAUSE   Osteoporosis is a disease in which the bones lose minerals and strength with aging. This can result in serious bone fractures. Your risk for osteoporosis can be identified using a bone density scan.  If you are 65 years of age or older, or if you are at risk for osteoporosis and fractures, ask your health care provider if you should be screened.  Ask your health care provider whether you should take a calcium or vitamin D supplement to lower your risk for osteoporosis.  Menopause may have certain physical symptoms and risks.  Hormone replacement therapy may reduce some of these symptoms and risks. Talk to your health care provider about whether hormone replacement therapy is right for you.  HOME CARE INSTRUCTIONS   Schedule regular health, dental, and eye exams.  Stay current with your immunizations.   Do not use any tobacco products including cigarettes, chewing tobacco, or electronic cigarettes.  If you are pregnant, do not drink alcohol.  If you are breastfeeding, limit how much and how often you drink alcohol.  Limit alcohol intake to no more than 1 drink per day for nonpregnant women. One drink equals 12 ounces of beer, 5 ounces of wine, or 1 ounces of hard liquor.  Do not use street  drugs.  Do not share needles.  Ask your health care provider for help if you need support or information about quitting drugs.  Tell your health care provider if you often feel depressed.  Tell your health care provider if you have ever been abused or do not feel safe at home. Document Released: 05/27/2011 Document Revised: 03/28/2014 Document Reviewed: 10/13/2013 Exit[employer] Patient Information 2015 [employer]. This information is not intended to replace advice given to you by your health care provider. Make sure you discuss any questions you have with your health care provider.  

## 2015-08-10 NOTE — Addendum Note (Signed)
Addended by: Resa Miner R on: 08/10/2015 10:46 AM   Modules accepted: Orders

## 2015-08-10 NOTE — Progress Notes (Signed)
   Subjective:    Patient ID: Jo Morrison, female    DOB: [birth year], 69 y.o.   MRN: [id number]  HPI The patient is a 69 YO female coming in new to check on her asthma and her blood pressure. She does not have an albuterol inhaler right now (hers is expired) and denies using it often. Maybe 3 times in the last 5 years. No flares requiring steroids and never intubated. Some mild allergy symptoms now for which she uses a nose spray successfully. Blood pressure she has been on the same medicines for many years and no known side effects or complications.   PMH, [facility], social history reviewed and updated.   Review of Systems  Constitutional: Negative for diaphoresis, activity change, appetite change, fatigue and unexpected weight change.  HENT: Negative.   Eyes: Negative.   Respiratory: Negative for cough, chest tightness, shortness of breath and wheezing.   Cardiovascular: Negative for chest pain, palpitations and leg swelling.  Gastrointestinal: Negative for nausea, abdominal pain, diarrhea, constipation and abdominal distention.  Musculoskeletal: Negative.   Skin: Negative.   Neurological: Negative.   Psychiatric/Behavioral: Negative.       Objective:   Physical Exam  Constitutional: She is oriented to person, place, and time. She appears well-developed and well-nourished.  HENT:  Head: Normocephalic and atraumatic.  Eyes: EOM are normal.  Neck: Normal range of motion.  Cardiovascular: Normal rate and regular rhythm.   Pulmonary/Chest: Effort normal and breath sounds normal. No respiratory distress. She has no wheezes. She has no rales.  Abdominal: Soft. Bowel sounds are normal.  Musculoskeletal: She exhibits no edema.  Neurological: She is alert and oriented to person, place, and time. Coordination normal.  Skin: Skin is warm and dry.  Psychiatric: She has a normal mood and affect.   Filed Vitals:   08/10/15 0815  BP: 132/82  Pulse: 81  Temp: 98.4 F (36.9 C)  TempSrc: Oral    Resp: 14  Height: 5\' 2"  (1.575 m)  Weight: 116 lb (52.617 kg)  SpO2: 98%      Assessment & Plan:  Tdap and flu shot given at visit.

## 2015-08-10 NOTE — Assessment & Plan Note (Signed)
HCTZ 25 mg continue and BP at goal today. Checking CMP and adjust as needed.

## 2015-09-12 ENCOUNTER — Ambulatory Visit: Payer: Medicare Other | Admitting: Internal Medicine

## 2015-11-05 NOTE — Assessment & Plan Note (Signed)
Right-sided breast cancer diagnosed 1996 treated with lumpectomy adjuvant chemotherapy with a.c. x4 followed by radiation and tamoxifen for 5 years Surveillance: Mammograms to be done December 2015. Today's breast exam was normal. 

## 2015-11-05 NOTE — Assessment & Plan Note (Signed)
Left breast lumpectomy revealed DCIS solid and cribriform types with comedonecrosis 1.1 cm [name], [name] status post lumpectomy and then placed on tamoxifen 20 mg daily. Her treatment was done at [place] moved to [facility] 09/28/2012  Tamoxifen toxicities:night sweats Surveillance: Today's breast exam was normal. mammograms are supposed to be done 10/31/2014. DEXA scan revealed normal bone density. Bone scan, Xray and Ct chest dec 2015: Deg changes  Survivorship:Discussed the importance of physical exercise in decreasing the likelihood of breast cancer recurrence. Recommended 30 mins daily 6 days a week of either brisk walking or cycling or swimming. Encouraged patient to eat more fruits and vegetables and decrease red meat.

## 2015-11-06 ENCOUNTER — Other Ambulatory Visit: Payer: Self-pay

## 2015-11-06 ENCOUNTER — Ambulatory Visit (HOSPITAL_BASED_OUTPATIENT_CLINIC_OR_DEPARTMENT_OTHER): Payer: Medicare Other | Admitting: Hematology and Oncology

## 2015-11-06 ENCOUNTER — Other Ambulatory Visit: Payer: Self-pay | Admitting: Internal Medicine

## 2015-11-06 ENCOUNTER — Telehealth: Payer: Self-pay | Admitting: Hematology and Oncology

## 2015-11-06 ENCOUNTER — Other Ambulatory Visit (HOSPITAL_BASED_OUTPATIENT_CLINIC_OR_DEPARTMENT_OTHER): Payer: Medicare Other

## 2015-11-06 ENCOUNTER — Encounter: Payer: Self-pay | Admitting: Hematology and Oncology

## 2015-11-06 VITALS — BP 153/67 | HR 89 | Temp 98.3°F | Resp 18 | Ht 62.0 in | Wt 114.9 lb

## 2015-11-06 DIAGNOSIS — N898 Other specified noninflammatory disorders of vagina: Secondary | ICD-10-CM

## 2015-11-06 DIAGNOSIS — N951 Menopausal and female climacteric states: Secondary | ICD-10-CM | POA: Insufficient documentation

## 2015-11-06 DIAGNOSIS — Z853 Personal history of malignant neoplasm of breast: Secondary | ICD-10-CM

## 2015-11-06 DIAGNOSIS — D0512 Intraductal carcinoma in situ of left breast: Secondary | ICD-10-CM

## 2015-11-06 DIAGNOSIS — C50511 Malignant neoplasm of lower-outer quadrant of right female breast: Secondary | ICD-10-CM

## 2015-11-06 LAB — CBC WITH DIFFERENTIAL/PLATELET
BASO%: 0.5 % (ref 0.0–2.0)
Basophils Absolute: 0.1 10*3/uL (ref 0.0–0.1)
EOS%: 1.7 % (ref 0.0–7.0)
Eosinophils Absolute: 0.2 10*3/uL (ref 0.0–0.5)
HCT: 41.6 % (ref 34.8–46.6)
HGB: 13.6 g/dL (ref 11.6–15.9)
LYMPH%: 13.4 % — ABNORMAL LOW (ref 14.0–49.7)
MCH: 29.2 pg (ref 25.1–34.0)
MCHC: 32.7 g/dL (ref 31.5–36.0)
MCV: 89.4 fL (ref 79.5–101.0)
MONO#: 0.7 10*3/uL (ref 0.1–0.9)
MONO%: 6.6 % (ref 0.0–14.0)
NEUT#: 8.4 10*3/uL — ABNORMAL HIGH (ref 1.5–6.5)
NEUT%: 77.8 % — ABNORMAL HIGH (ref 38.4–76.8)
Platelets: 272 10*3/uL (ref 145–400)
RBC: 4.65 10*6/uL (ref 3.70–5.45)
RDW: 13.6 % (ref 11.2–14.5)
WBC: 10.8 10*3/uL — ABNORMAL HIGH (ref 3.9–10.3)
lymph#: 1.4 10*3/uL (ref 0.9–3.3)

## 2015-11-06 LAB — COMPREHENSIVE METABOLIC PANEL
ALT: 12 U/L (ref 0–55)
AST: 17 U/L (ref 5–34)
Albumin: 3.6 g/dL (ref 3.5–5.0)
Alkaline Phosphatase: 56 U/L (ref 40–150)
Anion Gap: 8 mEq/L (ref 3–11)
BUN: 17 mg/dL (ref 7.0–26.0)
CO2: 31 mEq/L — ABNORMAL HIGH (ref 22–29)
Calcium: 9.2 mg/dL (ref 8.4–10.4)
Chloride: 102 mEq/L (ref 98–109)
Creatinine: 0.8 mg/dL (ref 0.6–1.1)
EGFR: 75 mL/min/{1.73_m2} — ABNORMAL LOW (ref >90)
Glucose: 97 mg/dl (ref 70–140)
Potassium: 3.4 mEq/L — ABNORMAL LOW (ref 3.5–5.1)
Sodium: 140 mEq/L (ref 136–145)
Total Bilirubin: 0.56 mg/dL (ref 0.20–1.20)
Total Protein: 6.8 g/dL (ref 6.4–8.3)

## 2015-11-06 NOTE — Telephone Encounter (Signed)
Appointments made and avs printed for patient °

## 2015-11-06 NOTE — Addendum Note (Signed)
Addended by: Prentiss Bells on: 11/06/2015 06:03 PM   Modules accepted: Orders, Medications

## 2015-11-06 NOTE — Progress Notes (Signed)
Patient Care Team: Hoyt Koch, MD as PCP - General (Internal Medicine)  DIAGNOSIS: No matching staging information was found for the patient.  SUMMARY OF ONCOLOGIC HISTORY:   Breast cancer of lower-outer quadrant of right female breast ([place])   05/29/1995 Surgery The right breast invasive ductal carcinoma [name] [name] status post right lumpectomy with axillary lymph node dissection and chemotherapy with 4 cycles of AC, adjuvant radiation and 5 years tamoxifen   08/07/1995 - 10/07/1995 Chemotherapy Cytoxan based chemo X 3 months   11/06/1995 - 12/19/1995 Radiation Therapy Adjuvant XRT   07/06/1996 - 09/05/2000 Anti-estrogen oral therapy Tamoxifen 20 mg daily x 5 years   12/13/2009 Relapse/Recurrence Left breast lumpectomy revealed DCIS solid and cribriform types with comedonecrosis 1.1 cm [name], [name]    01/22/2010 - 02/26/2010 Radiation Therapy Adjuvant XRT   04/05/2010 - 11/06/2015 Anti-estrogen oral therapy Tamoxifen 20 mg daily    CHIEF COMPLIANT: follow-up after completion of tamoxifen  INTERVAL HISTORY: Jo Morrison is a 69 year old with above-mentioned history of right breast cancer 1996 after she had lumpectomy chemotherapy and radiation she went on tamoxifen for 5 years. She had a relapse with left breast DCIS that was treated with lumpectomy radiation and completed over 5 years of tamoxifen therapy. She is here today to discuss pros and cons of continuation of tamoxifen therapy beyond 5 years. She reports no health issues or concerns.  REVIEW OF SYSTEMS:   Constitutional: Denies fevers, chills or abnormal weight loss Eyes: Denies blurriness of vision Ears, nose, mouth, throat, and face: Denies mucositis or sore throat Respiratory: Denies cough, dyspnea or wheezes Cardiovascular: Denies palpitation, chest discomfort or lower extremity swelling Gastrointestinal:  Denies nausea, heartburn or change in bowel habits Skin: Denies abnormal skin rashes Lymphatics:  Denies new lymphadenopathy or easy bruising Neurological:Denies numbness, tingling or new weaknesses Behavioral/Psych: Mood is stable, no new changes  Breast:  denies any pain or lumps or nodules in either breasts All other systems were reviewed with the patient and are negative.  I have reviewed the past medical history, past surgical history, social history and family history with the patient and they are unchanged from previous note.  ALLERGIES:  has No Known Allergies.  MEDICATIONS:  Current Outpatient Prescriptions  Medication Sig Dispense Refill  . Albuterol Sulfate (PROAIR RESPICLICK) 335 (90 BASE) MCG/ACT AEPB Inhale 2 puffs into the lungs every 4 (four) hours as needed (SOB). 1 each 0  . BIOTIN PO Take 1 tablet by mouth daily.    . budesonide (PULMICORT) 180 MCG/ACT inhaler Inhale 2 puffs into the lungs 2 (two) times daily.    . budesonide (RHINOCORT AQUA) 32 MCG/ACT nasal spray Place 1 spray into the nose as needed. 1 Bottle 3  . Cholecalciferol (VITAMIN D PO) Take 1 tablet by mouth daily.    . clonazePAM (KLONOPIN) 0.5 MG tablet Take 0.5 mg by mouth at bedtime as needed.    . hydrochlorothiazide (HYDRODIURIL) 25 MG tablet Take 1 tablet (25 mg total) by mouth daily. 90 tablet 3  . latanoprost (XALATAN) 0.005 % ophthalmic solution Place 1 drop into both eyes daily.    . tamoxifen (NOLVADEX) 20 MG tablet TAKE 1 TABLET BY MOUTH DAILY 90 tablet 0  . traZODone (DESYREL) 150 MG tablet Take 150 mg by mouth at bedtime.    Marland Kitchen venlafaxine XR (EFFEXOR-XR) 150 MG 24 hr capsule Take one tablet by mouth once daily     No current facility-administered medications for this visit.  PHYSICAL EXAMINATION: ECOG PERFORMANCE STATUS: 0 - Asymptomatic  Filed Vitals:   11/06/15 1054  BP: 153/67  Pulse: 89  Temp: 98.3 F (36.8 C)  Resp: 18   Filed Weights   11/06/15 1054  Weight: 114 lb 14.4 oz (52.118 kg)    GENERAL:alert, no distress and comfortable SKIN: skin color, texture, turgor  are normal, no rashes or significant lesions EYES: normal, Conjunctiva are pink and non-injected, sclera clear OROPHARYNX:no exudate, no erythema and lips, buccal mucosa, and tongue normal  NECK: supple, thyroid normal size, non-tender, without nodularity LYMPH:  no palpable lymphadenopathy in the cervical, axillary or inguinal LUNGS: clear to auscultation and percussion with normal breathing effort HEART: regular rate & rhythm and no murmurs and no lower extremity edema ABDOMEN:abdomen soft, non-tender and normal bowel sounds Musculoskeletal:no cyanosis of digits and no clubbing  NEURO: alert & oriented x 3 with fluent speech, no focal motor/sensory deficits BREAST: No palpable masses or nodules in either right or left breasts. No palpable axillary supraclavicular or infraclavicular adenopathy no breast tenderness or nipple discharge. (exam performed in the presence of a chaperone)  LABORATORY DATA:  I have reviewed the data as listed   Chemistry      Component Value Date/Time   NA 140 11/06/2015 1039   NA 142 08/10/2015 0856   NA 142 06/09/2014 1030   K 3.4* 11/06/2015 1039   K 3.9 08/10/2015 0856   CL 102 08/10/2015 0856   CL 101 03/01/2013 1359   CO2 31* 11/06/2015 1039   CO2 31 08/10/2015 0856   BUN 17.0 11/06/2015 1039   BUN 14 08/10/2015 0856   BUN 15 06/09/2014 1030   CREATININE 0.8 11/06/2015 1039   CREATININE 0.69 08/10/2015 0856      Component Value Date/Time   CALCIUM 9.2 11/06/2015 1039   CALCIUM 9.5 08/10/2015 0856   ALKPHOS 56 11/06/2015 1039   ALKPHOS 48 08/10/2015 0856   AST 17 11/06/2015 1039   AST 17 08/10/2015 0856   ALT 12 11/06/2015 1039   ALT 14 08/10/2015 0856   BILITOT 0.56 11/06/2015 1039   BILITOT 0.6 08/10/2015 0856       Lab Results  Component Value Date   WBC 10.8* 11/06/2015   HGB 13.6 11/06/2015   HCT 41.6 11/06/2015   MCV 89.4 11/06/2015   PLT 272 11/06/2015   NEUTROABS 8.4* 11/06/2015   ASSESSMENT & PLAN:  Breast cancer of  lower-outer quadrant of right female breast ([place]) Right-sided breast cancer diagnosed 1996 treated with lumpectomy adjuvant chemotherapy with a.c. x4 followed by radiation and tamoxifen for 5 years completed 2001 Surveillance: Mammograms December 7th 2015: Normal postlumpectomy changes. Today's breast exam was normal.  Ductal carcinoma in situ (DCIS) of left breast Left breast lumpectomy revealed DCIS solid and cribriform types with comedonecrosis 1.1 cm [name], [name] status post lumpectomy and then placed on tamoxifen 20 mg daily. Her treatment was done at [place] moved to [facility] 09/28/2012  Tamoxifen toxicities:night sweats I recommended discontinuation of tamoxifen therapy because of his no data to support continuation of tamoxifen beyond 5 years for DCIS.  Vaginal dryness: I encouraged her to try coconut oil if that does not help then she can try topical estrogen cream that would be prescribed by her gynecologist.I believe that the systemic absorption of topical estrogen cream is extremely minimal.  Surveillance: Today's breast exam was normal. mammograms done 10/31/2014. DEXA scan revealed normal bone density. Bone scan, Xray and Ct chest dec 2015: Deg changes  Survivorship:Discussed the importance of physical exercise in decreasing the likelihood of breast cancer recurrence. Recommended 30 mins daily 6 days a week of either brisk walking or cycling or swimming. Encouraged patient to eat more fruits and vegetables and decrease red meat.    No orders of the defined types were placed in this encounter.   The patient has a good understanding of the overall plan. she agrees with it. she will call with any problems that may develop before the next visit here.   Rulon Eisenmenger, MD 11/06/2015

## 2015-11-15 ENCOUNTER — Ambulatory Visit
Admission: RE | Admit: 2015-11-15 | Discharge: 2015-11-15 | Disposition: A | Discharge status: Home / Self Care | Payer: Medicare Other | Source: Ambulatory Visit | Attending: Internal Medicine | Admitting: Internal Medicine

## 2015-11-15 DIAGNOSIS — Z853 Personal history of malignant neoplasm of breast: Secondary | ICD-10-CM

## 2016-02-07 ENCOUNTER — Encounter: Payer: Self-pay | Admitting: Internal Medicine

## 2016-02-07 ENCOUNTER — Ambulatory Visit (INDEPENDENT_AMBULATORY_CARE_PROVIDER_SITE_OTHER): Payer: Medicare Other | Admitting: Internal Medicine

## 2016-02-07 VITALS — BP 122/68 | HR 88 | Temp 98.5°F | Resp 14 | Ht 62.0 in | Wt 118.0 lb

## 2016-02-07 DIAGNOSIS — I10 Essential (primary) hypertension: Secondary | ICD-10-CM | POA: Diagnosis not present

## 2016-02-07 DIAGNOSIS — G5601 Carpal tunnel syndrome, right upper limb: Secondary | ICD-10-CM

## 2016-02-07 NOTE — Progress Notes (Signed)
Pre visit review using our clinic review tool, if applicable. No additional management support is needed unless otherwise documented below in the visit note. 

## 2016-02-07 NOTE — Patient Instructions (Signed)
Try the wrist brace at night time for the hand. This is likely some carpal tunnel in the hand.   No blood work today and come back in about 6 months for a physical.   Carpal Tunnel Syndrome Carpal tunnel syndrome is a condition that causes pain in your hand and arm. The carpal tunnel is a narrow area located on the palm side of your wrist. Repeated wrist motion or certain diseases may cause swelling within the tunnel. This swelling pinches the main nerve in the wrist (median nerve). CAUSES  This condition may be caused by:   Repeated wrist motions.  Wrist injuries.  Arthritis.  A cyst or tumor in the carpal tunnel.  Fluid buildup during pregnancy. Sometimes the cause of this condition is not known.  RISK FACTORS This condition is more likely to develop in:   People who have jobs that cause them to repeatedly move their wrists in the same motion, such as butchers and cashiers.  Women.  People with certain conditions, such as:  Diabetes.  Obesity.  An underactive thyroid (hypothyroidism).  Kidney failure. SYMPTOMS  Symptoms of this condition include:   A tingling feeling in your fingers, especially in your thumb, index, and middle fingers.  Tingling or numbness in your hand.  An aching feeling in your entire arm, especially when your wrist and elbow are bent for long periods of time.  Wrist pain that goes up your arm to your shoulder.  Pain that goes down into your palm or fingers.  A weak feeling in your hands. You may have trouble grabbing and holding items. Your symptoms may feel worse during the night.  DIAGNOSIS  This condition is diagnosed with a medical history and physical exam. You may also have tests, including:   An electromyogram (EMG). This test measures electrical signals sent by your nerves into the muscles.  X-rays. TREATMENT  Treatment for this condition includes:  Lifestyle changes. It is important to stop doing or modify the activity that  caused your condition.  Physical or occupational therapy.  Medicines for pain and inflammation. This may include medicine that is injected into your wrist.  A wrist splint.  Surgery. HOME CARE INSTRUCTIONS  If You Have a Splint:  Wear it as told by your health care provider. Remove it only as told by your health care provider.  Loosen the splint if your fingers become numb and tingle, or if they turn cold and blue.  Keep the splint clean and dry. General Instructions  Take over-the-counter and prescription medicines only as told by your health care provider.  Rest your wrist from any activity that may be causing your pain. If your condition is work related, talk to your employer about changes that can be made, such as getting a wrist pad to use while typing.  If directed, apply ice to the painful area:  Put ice in a plastic bag.  Place a towel between your skin and the bag.  Leave the ice on for 20 minutes, 2-3 times per day.  Keep all follow-up visits as told by your health care provider. This is important.  Do any exercises as told by your health care provider, physical therapist, or occupational therapist. [place] IF:   You have new symptoms.  Your pain is not controlled with medicines.  Your symptoms get worse.   This information is not intended to replace advice given to you by your health care provider. Make sure you discuss any questions you  have with your health care provider.   Document Released: 11/08/2000 Document Revised: 08/02/2015 Document Reviewed: 03/29/2015 Elsevier Interactive Patient Education [employer].

## 2016-02-08 DIAGNOSIS — G5601 Carpal tunnel syndrome, right upper limb: Secondary | ICD-10-CM | POA: Insufficient documentation

## 2016-02-08 NOTE — Assessment & Plan Note (Signed)
Talked to her about getting splints for night time for the right wrist. If not successful and bothersome next step would be hand surgery referral. She will try the splints. Educated her on carpal tunnel syndrome. No weakness or atrophy. Would be important to move quicker as she has contracture on the left so this is her most functional hand.

## 2016-02-08 NOTE — Progress Notes (Signed)
   Subjective:    Patient ID: Jo Morrison, female    DOB: 14-May-1946, 70 y.o.   MRN: [id number]  HPI The patient is a 70 YO female coming in for new problem of numbness in her right hand during the night. Not associated with position although she always sleeps in same position. Also has been having some numbness during the day with extended driving or hand position. Some pain when they are numb. Has not tried anything for it. Mildly worsening over time.   Review of Systems  Constitutional: Negative for diaphoresis, activity change, appetite change, fatigue and unexpected weight change.  Respiratory: Negative for cough, chest tightness, shortness of breath and wheezing.   Cardiovascular: Negative for chest pain, palpitations and leg swelling.  Gastrointestinal: Negative for nausea, abdominal pain, diarrhea, constipation and abdominal distention.  Musculoskeletal: Positive for arthralgias.  Skin: Negative.   Neurological: Positive for numbness.  Psychiatric/Behavioral: Negative.       Objective:   Physical Exam  Constitutional: She is oriented to person, place, and time. She appears well-developed and well-nourished.  HENT:  Head: Normocephalic and atraumatic.  Eyes: EOM are normal.  Neck: Normal range of motion.  Cardiovascular: Normal rate and regular rhythm.   Pulmonary/Chest: Effort normal and breath sounds normal. No respiratory distress. She has no wheezes. She has no rales.  Abdominal: Soft. Bowel sounds are normal.  Musculoskeletal: She exhibits no edema.  tinnel positive  Neurological: She is alert and oriented to person, place, and time. Coordination normal.  Skin: Skin is warm and dry.  Psychiatric: She has a normal mood and affect.   Filed Vitals:   02/07/16 1035  BP: 122/68  Pulse: 88  Temp: 98.5 F (36.9 C)  TempSrc: Oral  Resp: 14  Height: 5\' 2"  (1.575 m)  Weight: 118 lb (53.524 kg)  SpO2: 99%      Assessment & Plan:

## 2016-02-08 NOTE — Assessment & Plan Note (Signed)
BP at goal on hctz and last bmp without indication for change.

## 2016-07-27 ENCOUNTER — Encounter: Payer: Self-pay | Admitting: Internal Medicine

## 2016-08-06 ENCOUNTER — Encounter: Payer: Self-pay | Admitting: Internal Medicine

## 2016-08-07 NOTE — Telephone Encounter (Signed)
Spoke to patient and she does not have an appointment. She will come in with her husband on Friday and will schedule an appt for herself.

## 2016-08-09 ENCOUNTER — Ambulatory Visit (INDEPENDENT_AMBULATORY_CARE_PROVIDER_SITE_OTHER): Payer: Medicare Other

## 2016-08-09 DIAGNOSIS — Z23 Encounter for immunization: Secondary | ICD-10-CM

## 2016-08-12 ENCOUNTER — Ambulatory Visit: Payer: Medicare Other

## 2016-08-13 ENCOUNTER — Encounter: Payer: Medicare Other | Admitting: Internal Medicine

## 2016-08-16 ENCOUNTER — Ambulatory Visit (INDEPENDENT_AMBULATORY_CARE_PROVIDER_SITE_OTHER): Payer: Medicare Other | Admitting: Internal Medicine

## 2016-08-16 ENCOUNTER — Encounter: Payer: Self-pay | Admitting: Internal Medicine

## 2016-08-16 VITALS — BP 120/70 | HR 100 | Temp 99.1°F | Resp 14 | Ht 63.0 in | Wt 119.0 lb

## 2016-08-16 DIAGNOSIS — Z0001 Encounter for general adult medical examination with abnormal findings: Secondary | ICD-10-CM

## 2016-08-16 DIAGNOSIS — J4520 Mild intermittent asthma, uncomplicated: Secondary | ICD-10-CM | POA: Diagnosis not present

## 2016-08-16 DIAGNOSIS — I10 Essential (primary) hypertension: Secondary | ICD-10-CM | POA: Diagnosis not present

## 2016-08-16 DIAGNOSIS — F329 Major depressive disorder, single episode, unspecified: Secondary | ICD-10-CM

## 2016-08-16 DIAGNOSIS — Z0000 Encounter for general adult medical examination without abnormal findings: Secondary | ICD-10-CM

## 2016-08-16 DIAGNOSIS — F32A Depression, unspecified: Secondary | ICD-10-CM

## 2016-08-16 MED ORDER — VENLAFAXINE HCL ER 37.5 MG PO CP24: 37.5000 mg | ORAL_CAPSULE | Freq: Every day | ORAL | 3 refills | Status: DC

## 2016-08-16 NOTE — Patient Instructions (Addendum)
We have sent in the prescription for the effexor 37.5 mg that you take 1 pill daily with the 150 mg effexor to see if this helps more with the depression.   We will get you in with the psychiatrist.   Work on getting back to the exercise and practicing taking time for you.   For the constipation get senokot-d in the pharmacy (you can ask the pharmacist) and take 1 pill daily. If in 2-3 days you do not notice a benefit increase to 1 pill twice a day. The maximum dose is 2 pills twice a day to help with constipation. It has a medicine to help the bowels move as well as a medicine to keep things soft.   Stress and Stress Management Stress is a normal reaction to life events. It is what you feel when life demands more than you are used to or more than you can handle. Some stress can be useful. For example, the stress reaction can help you catch the last bus of the day, study for a test, or meet a deadline at work. But stress that occurs too often or for too long can cause problems. It can affect your emotional health and interfere with relationships and normal daily activities. Too much stress can weaken your immune system and increase your risk for physical illness. If you already have a medical problem, stress can make it worse. CAUSES  All sorts of life events may cause stress. An event that causes stress for one person may not be stressful for another person. Major life events commonly cause stress. These may be positive or negative. Examples include losing your job, moving into a new home, getting married, having a baby, or losing a loved one. Less obvious life events may also cause stress, especially if they occur day after day or in combination. Examples include working long hours, driving in traffic, caring for children, being in debt, or being in a difficult relationship. SIGNS AND SYMPTOMS Stress may cause emotional symptoms including, the following:  Anxiety. This is feeling worried, afraid, on  edge, overwhelmed, or out of control.  Anger. This is feeling irritated or impatient.  Depression. This is feeling sad, down, helpless, or guilty.  Difficulty focusing, remembering, or making decisions. Stress may cause physical symptoms, including the following:   Aches and pains. These may affect your head, neck, back, stomach, or other areas of your body.  Tight muscles or clenched jaw.  Low energy or trouble sleeping. Stress may cause unhealthy behaviors, including the following:   Eating to feel better (overeating) or skipping meals.  Sleeping too little, too much, or both.  Working too much or putting off tasks (procrastination).  Smoking, drinking alcohol, or using drugs to feel better. DIAGNOSIS  Stress is diagnosed through an assessment by your health care provider. Your health care provider will ask questions about your symptoms and any stressful life events.Your health care provider will also ask about your medical history and may order blood tests or other tests. Certain medical conditions and medicine can cause physical symptoms similar to stress. Mental illness can cause emotional symptoms and unhealthy behaviors similar to stress. Your health care provider may refer you to a mental health professional for further evaluation.  TREATMENT  Stress management is the recommended treatment for stress.The goals of stress management are reducing stressful life events and coping with stress in healthy ways.  Techniques for reducing stressful life events include the following:  Stress identification. Self-monitor for stress  and identify what causes stress for you. These skills may help you to avoid some stressful events.  Time management. Set your priorities, keep a calendar of events, and learn to say "no." These tools can help you avoid making too many commitments. Techniques for coping with stress include the following:  Rethinking the problem. Try to think realistically  about stressful events rather than ignoring them or overreacting. Try to find the positives in a stressful situation rather than focusing on the negatives.  Exercise. Physical exercise can release both physical and emotional tension. The key is to find a form of exercise you enjoy and do it regularly.  Relaxation techniques. These relax the body and mind. Examples include yoga, meditation, tai chi, biofeedback, deep breathing, progressive muscle relaxation, listening to music, being out in nature, journaling, and other hobbies. Again, the key is to find one or more that you enjoy and can do regularly.  Healthy lifestyle. Eat a balanced diet, get plenty of sleep, and do not smoke. Avoid using alcohol or drugs to relax.  Strong support network. Spend time with family, friends, or other people you enjoy being around.Express your feelings and talk things over with someone you trust. Counseling or talktherapy with a mental health professional may be helpful if you are having difficulty managing stress on your own. Medicine is typically not recommended for the treatment of stress.Talk to your health care provider if you think you need medicine for symptoms of stress. HOME CARE INSTRUCTIONS  Keep all follow-up visits as directed by your health care provider.  Take all medicines as directed by your health care provider. SEEK MEDICAL CARE IF:  Your symptoms get worse or you start having new symptoms.  You feel overwhelmed by your problems and can no longer manage them on your own. SEEK IMMEDIATE MEDICAL CARE IF:  You feel like hurting yourself or someone else.   This information is not intended to replace advice given to you by your health care provider. Make sure you discuss any questions you have with your health care provider.   Document Released: 05/07/2001 Document Revised: 12/02/2014 Document Reviewed: 07/06/2013 Elsevier Interactive Patient Education 2016 [employer].   Health  Maintenance, Female Adopting a healthy lifestyle and getting preventive care can go a long way to promote health and wellness. Talk with your health care provider about what schedule of regular examinations is right for you. This is a good chance for you to check in with your provider about disease prevention and staying healthy. In between checkups, there are plenty of things you can do on your own. Experts have done a lot of research about which lifestyle changes and preventive measures are most likely to keep you healthy. Ask your health care provider for more information. WEIGHT AND DIET  Eat a healthy diet  Be sure to include plenty of vegetables, fruits, low-fat dairy products, and lean protein.  Do not eat a lot of foods high in solid fats, added sugars, or salt.  Get regular exercise. This is one of the most important things you can do for your health.  Most adults should exercise for at least 150 minutes each week. The exercise should increase your heart rate and make you sweat (moderate-intensity exercise).  Most adults should also do strengthening exercises at least twice a week. This is in addition to the moderate-intensity exercise.  Maintain a healthy weight  Body mass index (BMI) is a measurement that can be used to identify possible weight problems. It  estimates body fat based on height and weight. Your health care provider can help determine your BMI and help you achieve or maintain a healthy weight.  For females 3 years of age and older:   A BMI below 18.5 is considered underweight.  A BMI of 18.5 to 24.9 is normal.  A BMI of 25 to 29.9 is considered overweight.  A BMI of 30 and above is considered obese.  Watch levels of cholesterol and blood lipids  You should start having your blood tested for lipids and cholesterol at 70 years of age, then have this test every 5 years.  You may need to have your cholesterol levels checked more often if:  Your lipid or  cholesterol levels are high.  You are older than 70 years of age.  You are at high risk for heart disease.  CANCER SCREENING   Lung Cancer  Lung cancer screening is recommended for adults 46-18 years old who are at high risk for lung cancer because of a history of smoking.  A yearly low-dose CT scan of the lungs is recommended for people who:  Currently smoke.  Have quit within the past 15 years.  Have at least a 30-pack-year history of smoking. A pack year is smoking an average of one pack of cigarettes a day for 1 year.  Yearly screening should continue until it has been 15 years since you quit.  Yearly screening should stop if you develop a health problem that would prevent you from having lung cancer treatment.  Breast Cancer  Practice breast self-awareness. This means understanding how your breasts normally appear and feel.  It also means doing regular breast self-exams. Let your health care provider know about any changes, no matter how small.  If you are in your 20s or 30s, you should have a clinical breast exam (CBE) by a health care provider every 1-3 years as part of a regular health exam.  If you are 89 or older, have a CBE every year. Also consider having a breast X-ray (mammogram) every year.  If you have a family history of breast cancer, talk to your health care provider about genetic screening.  If you are at high risk for breast cancer, talk to your health care provider about having an MRI and a mammogram every year.  Breast cancer gene (BRCA) assessment is recommended for women who have family members with BRCA-related cancers. BRCA-related cancers include:  Breast.  Ovarian.  Tubal.  Peritoneal cancers.  Results of the assessment will determine the need for genetic counseling and BRCA1 and BRCA2 testing. Cervical Cancer Your health care provider may recommend that you be screened regularly for cancer of the pelvic organs (ovaries, uterus, and  vagina). This screening involves a pelvic examination, including checking for microscopic changes to the surface of your cervix (Pap test). You may be encouraged to have this screening done every 3 years, beginning at age 32.  For women ages 57-65, health care providers may recommend pelvic exams and Pap testing every 3 years, or they may recommend the Pap and pelvic exam, combined with testing for human papilloma virus (HPV), every 5 years. Some types of HPV increase your risk of cervical cancer. Testing for HPV may also be done on women of any age with unclear Pap test results.  Other health care providers may not recommend any screening for nonpregnant women who are considered low risk for pelvic cancer and who do not have symptoms. Ask your health care provider if  a screening pelvic exam is right for you.  If you have had past treatment for cervical cancer or a condition that could lead to cancer, you need Pap tests and screening for cancer for at least 20 years after your treatment. If Pap tests have been discontinued, your risk factors (such as having a new sexual partner) need to be reassessed to determine if screening should resume. Some women have medical problems that increase the chance of getting cervical cancer. In these cases, your health care provider may recommend more frequent screening and Pap tests. Colorectal Cancer  This type of cancer can be detected and often prevented.  Routine colorectal cancer screening usually begins at 70 years of age and continues through 70 years of age.  Your health care provider may recommend screening at an earlier age if you have risk factors for colon cancer.  Your health care provider may also recommend using home test kits to check for hidden blood in the stool.  A small camera at the end of a tube can be used to examine your colon directly (sigmoidoscopy or colonoscopy). This is done to check for the earliest forms of colorectal  cancer.  Routine screening usually begins at age 66.  Direct examination of the colon should be repeated every 5-10 years through 70 years of age. However, you may need to be screened more often if early forms of precancerous polyps or small growths are found. Skin Cancer  Check your skin from head to toe regularly.  Tell your health care provider about any new moles or changes in moles, especially if there is a change in a mole's shape or color.  Also tell your health care provider if you have a mole that is larger than the size of a pencil eraser.  Always use sunscreen. Apply sunscreen liberally and repeatedly throughout the day.  Protect yourself by wearing long sleeves, pants, a wide-brimmed hat, and sunglasses whenever you are outside. HEART DISEASE, DIABETES, AND HIGH BLOOD PRESSURE   High blood pressure causes heart disease and increases the risk of stroke. High blood pressure is more likely to develop in:  People who have blood pressure in the high end of the normal range (130-139/85-89 mm Hg).  People who are overweight or obese.  People who are African American.  If you are 89-59 years of age, have your blood pressure checked every 3-5 years. If you are 35 years of age or older, have your blood pressure checked every year. You should have your blood pressure measured twice--once when you are at a hospital or clinic, and once when you are not at a hospital or clinic. Record the average of the two measurements. To check your blood pressure when you are not at a hospital or clinic, you can use:  An automated blood pressure machine at a pharmacy.  A home blood pressure monitor.  If you are between 87 years and 78 years old, ask your health care provider if you should take aspirin to prevent strokes.  Have regular diabetes screenings. This involves taking a blood sample to check your fasting blood sugar level.  If you are at a normal weight and have a low risk for diabetes,  have this test once every three years after 70 years of age.  If you are overweight and have a high risk for diabetes, consider being tested at a younger age or more often. PREVENTING INFECTION  Hepatitis B  If you have a higher risk for hepatitis B,  you should be screened for this virus. You are considered at high risk for hepatitis B if:  You were born in a country where hepatitis B is common. Ask your health care provider which countries are considered high risk.  Your parents were born in a high-risk country, and you have not been immunized against hepatitis B (hepatitis B vaccine).  You have HIV or AIDS.  You use needles to inject street drugs.  You live with someone who has hepatitis B.  You have had sex with someone who has hepatitis B.  You get hemodialysis treatment.  You take certain medicines for conditions, including cancer, organ transplantation, and autoimmune conditions. Hepatitis C  Blood testing is recommended for:  Everyone born from [birth year] through 1965.  Anyone with known risk factors for hepatitis C. Sexually transmitted infections (STIs)  You should be screened for sexually transmitted infections (STIs) including gonorrhea and chlamydia if:  You are sexually active and are younger than 70 years of age.  You are older than 70 years of age and your health care provider tells you that you are at risk for this type of infection.  Your sexual activity has changed since you were last screened and you are at an increased risk for chlamydia or gonorrhea. Ask your health care provider if you are at risk.  If you do not have HIV, but are at risk, it may be recommended that you take a prescription medicine daily to prevent HIV infection. This is called pre-exposure prophylaxis (PrEP). You are considered at risk if:  You are sexually active and do not regularly use condoms or know the HIV status of your partner(s).  You take drugs by injection.  You are sexually  active with a partner who has HIV. Talk with your health care provider about whether you are at high risk of being infected with HIV. If you choose to begin PrEP, you should first be tested for HIV. You should then be tested every 3 months for as long as you are taking PrEP.  PREGNANCY   If you are premenopausal and you may become pregnant, ask your health care provider about preconception counseling.  If you may become pregnant, take 400 to 800 micrograms (mcg) of folic acid every day.  If you want to prevent pregnancy, talk to your health care provider about birth control (contraception). OSTEOPOROSIS AND MENOPAUSE   Osteoporosis is a disease in which the bones lose minerals and strength with aging. This can result in serious bone fractures. Your risk for osteoporosis can be identified using a bone density scan.  If you are 33 years of age or older, or if you are at risk for osteoporosis and fractures, ask your health care provider if you should be screened.  Ask your health care provider whether you should take a calcium or vitamin D supplement to lower your risk for osteoporosis.  Menopause may have certain physical symptoms and risks.  Hormone replacement therapy may reduce some of these symptoms and risks. Talk to your health care provider about whether hormone replacement therapy is right for you.  HOME CARE INSTRUCTIONS   Schedule regular health, dental, and eye exams.  Stay current with your immunizations.   Do not use any tobacco products including cigarettes, chewing tobacco, or electronic cigarettes.  If you are pregnant, do not drink alcohol.  If you are breastfeeding, limit how much and how often you drink alcohol.  Limit alcohol intake to no more than 1 drink per day  for nonpregnant women. One drink equals 12 ounces of beer, 5 ounces of wine, or 1 ounces of hard liquor.  Do not use street drugs.  Do not share needles.  Ask your health care provider for help if  you need support or information about quitting drugs.  Tell your health care provider if you often feel depressed.  Tell your health care provider if you have ever been abused or do not feel safe at home.   This information is not intended to replace advice given to you by your health care provider. Make sure you discuss any questions you have with your health care provider.   Document Released: 05/27/2011 Document Revised: 12/02/2014 Document Reviewed: 10/13/2013 Elsevier Interactive Patient Education [employer].

## 2016-08-16 NOTE — Assessment & Plan Note (Signed)
Rare albuterol usage.

## 2016-08-16 NOTE — Assessment & Plan Note (Signed)
Flu shot done, colonoscopy up to date. Tetanus up to date. Shingles and pneumonia completed. Gets derm full body check yearly. Informed about dangers of distracted driving. Given 10 year screening recommendations. Declines need for hep c screening.

## 2016-08-16 NOTE — Assessment & Plan Note (Signed)
Referral to psych. Worse since her husband has had treatment for his prostate cancer. 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 Previously controlled with trazodone and effexor (150). Will refer to psych and also increase the effexor to 187.5 mg daily to see if this helps more. Klonopin prn but rare usage and she has not been using more as the anxiety is stable.

## 2016-08-16 NOTE — Assessment & Plan Note (Signed)
BP at goal on hctz. Checking CMP and adjust as needed.  

## 2016-08-16 NOTE — Progress Notes (Signed)
Pre visit review using our clinic review tool, if applicable. No additional management support is needed unless otherwise documented below in the visit note. 

## 2016-08-16 NOTE — Progress Notes (Signed)
   Subjective:    Patient ID: Jo Morrison, female    DOB: 23-May-1946, 70 y.o.   MRN: [id number]  HPI Here for medicare wellness and CPE, no new complaints. Please see A/P for status and treatment of chronic medical problems.   Diet: heart healthy Physical activity: sedentary (previously exercising well) Depression/mood screen: negative Hearing: intact to whispered voice Visual acuity: grossly normal, performs annual eye exam  ADLs: capable Fall risk: none Home safety: good Cognitive evaluation: intact to orientation, naming, recall and repetition EOL planning: adv directives discussed, in place  I have personally reviewed and have noted 1. The patient's medical and social history - reviewed today no changes 2. Their use of alcohol, tobacco or illicit drugs 3. Their current medications and supplements 4. The patient's functional ability including ADL's, fall risks, home safety risks and hearing or visual impairment. 5. Diet and physical activities 6. Evidence for depression or mood disorders 7. Care team reviewed and updated (available in snapshot)  Review of Systems  Constitutional: Negative for activity change, appetite change, diaphoresis, fatigue and unexpected weight change.  HENT: Negative.   Eyes: Negative.   Respiratory: Negative for cough, chest tightness, shortness of breath and wheezing.   Cardiovascular: Negative for chest pain, palpitations and leg swelling.  Gastrointestinal: Negative for abdominal distention, abdominal pain, constipation, diarrhea and nausea.  Musculoskeletal: Negative.   Skin: Negative.   Neurological: Negative.   Psychiatric/Behavioral: Negative.       Objective:   Physical Exam  Constitutional: She is oriented to person, place, and time. She appears well-developed and well-nourished.  HENT:  Head: Normocephalic and atraumatic.  Eyes: EOM are normal.  Neck: Normal range of motion.  Cardiovascular: Normal rate and regular rhythm.     Pulmonary/Chest: Effort normal and breath sounds normal. No respiratory distress. She has no wheezes. She has no rales.  Abdominal: Soft. Bowel sounds are normal.  Musculoskeletal: She exhibits no edema.  Neurological: She is alert and oriented to person, place, and time. Coordination normal.  Skin: Skin is warm and dry.  Psychiatric: She has a normal mood and affect.   Vitals:   08/16/16 1340  BP: 120/70  Pulse: 100  Resp: 14  Temp: 99.1 F (37.3 C)  TempSrc: Oral  SpO2: 97%  Weight: 119 lb (54 kg)  Height: 5\' 3"  (1.6 m)      Assessment & Plan:

## 2016-09-08 ENCOUNTER — Other Ambulatory Visit: Payer: Self-pay | Admitting: Internal Medicine

## 2016-10-08 ENCOUNTER — Other Ambulatory Visit: Payer: Self-pay | Admitting: Hematology and Oncology

## 2016-10-08 DIAGNOSIS — Z853 Personal history of malignant neoplasm of breast: Secondary | ICD-10-CM

## 2016-11-04 NOTE — Assessment & Plan Note (Signed)
Right-sided breast cancer diagnosed 1996 treated with lumpectomy adjuvant chemotherapy with a.c. x4 followed by radiation and tamoxifen for 5 years completed 2001  Ductal carcinoma in situ (DCIS) of left breast Left breast lumpectomy revealed DCIS solid and cribriform types with comedonecrosis 1.1 cm [name], [name] status post lumpectomy and then placed on tamoxifen 20 mg daily. Her treatment was done at [place] moved to [facility] 09/28/2012  Tamoxifen toxicities:night sweats I recommended discontinuation of tamoxifen therapy because of his no data to support continuation of tamoxifen beyond 5 years for DCIS.  Vaginal dryness: I encouraged her to try coconut oil if that does not help then she can try topical estrogen cream that would be prescribed by her gynecologist.I believe that the systemic absorption of topical estrogen cream is extremely minimal.  Surveillance: Today's breast exam was normal. mammograms done 11/15/2015. DEXA scan revealed normal bone density. Bone scan, Xray and Ct chest dec 2015: Deg changes  Survivorship:Discussed the importance of physical exercise in decreasing the likelihood of breast cancer recurrence. Recommended 30 mins daily 6 days a week of either brisk walking or cycling or swimming. Encouraged patient to eat more fruits and vegetables and decrease red meat.

## 2016-11-05 ENCOUNTER — Other Ambulatory Visit: Payer: Self-pay | Admitting: Hematology and Oncology

## 2016-11-05 ENCOUNTER — Ambulatory Visit (HOSPITAL_BASED_OUTPATIENT_CLINIC_OR_DEPARTMENT_OTHER): Payer: Medicare Other | Admitting: Hematology and Oncology

## 2016-11-05 ENCOUNTER — Encounter: Payer: Self-pay | Admitting: Hematology and Oncology

## 2016-11-05 DIAGNOSIS — Z853 Personal history of malignant neoplasm of breast: Secondary | ICD-10-CM | POA: Diagnosis not present

## 2016-11-05 DIAGNOSIS — Z170 Estrogen receptor positive status [ER+]: Principal | ICD-10-CM

## 2016-11-05 DIAGNOSIS — C50511 Malignant neoplasm of lower-outer quadrant of right female breast: Secondary | ICD-10-CM

## 2016-11-05 NOTE — Progress Notes (Signed)
Patient Care Team: Hoyt Koch, MD as PCP - General (Internal Medicine)  DIAGNOSIS:  Encounter Diagnosis  Name Primary?  . Malignant neoplasm of lower-outer quadrant of right breast of female, estrogen receptor positive ([place])     SUMMARY OF ONCOLOGIC HISTORY:   Breast cancer of lower-outer quadrant of right female breast ([place])   05/29/1995 Surgery    The right breast invasive ductal carcinoma [name] [name] status post right lumpectomy with axillary lymph node dissection and chemotherapy with 4 cycles of AC, adjuvant radiation and 5 years tamoxifen      08/07/1995 - 10/07/1995 Chemotherapy    Cytoxan based chemo X 3 months      11/06/1995 - 12/19/1995 Radiation Therapy    Adjuvant XRT      07/06/1996 - 09/05/2000 Anti-estrogen oral therapy    Tamoxifen 20 mg daily x 5 years      12/13/2009 Relapse/Recurrence    Left breast lumpectomy revealed DCIS solid and cribriform types with comedonecrosis 1.1 cm [name], [name]       01/22/2010 - 02/26/2010 Radiation Therapy    Adjuvant XRT      04/05/2010 - 11/06/2015 Anti-estrogen oral therapy    Tamoxifen 20 mg daily       CHIEF COMPLIANT: Surveillance of breast cancer  INTERVAL HISTORY: Jo Morrison is a 70 year old who had breast cancer diagnosed in 1996. She had lumpectomy, chemotherapy and radiation and went on tamoxifen for 5 years. Because she had a relapse of left breast DCIS she once again underwent lumpectomy and radiation and took an additional 5 years of tamoxifen therapy which was concluded in December 2016. She is here for annual follow-up and reports no major concerns. She denies any lumps or nodules in the breasts.  REVIEW OF SYSTEMS:   Constitutional: Denies fevers, chills or abnormal weight loss Eyes: Denies blurriness of vision Ears, nose, mouth, throat, and face: Denies mucositis or sore throat Respiratory: Denies cough, dyspnea or wheezes Cardiovascular: Denies palpitation, chest  discomfort Gastrointestinal:  Denies nausea, heartburn or change in bowel habits Skin: Denies abnormal skin rashes Lymphatics: Denies new lymphadenopathy or easy bruising Neurological:Denies numbness, tingling or new weaknesses Behavioral/Psych: Mood is stable, no new changes  Extremities: No lower extremity edema Breast:  denies any pain or lumps or nodules in either breasts All other systems were reviewed with the patient and are negative.  I have reviewed the past medical history, past surgical history, social history and family history with the patient and they are unchanged from previous note.  ALLERGIES:  has No Known Allergies.  MEDICATIONS:  Current Outpatient Prescriptions  Medication Sig Dispense Refill  . Albuterol Sulfate (PROAIR RESPICLICK) 740 (90 BASE) MCG/ACT AEPB Inhale 2 puffs into the lungs every 4 (four) hours as needed (SOB). 1 each 0  . BIOTIN PO Take 1 tablet by mouth daily.    . budesonide (PULMICORT) 180 MCG/ACT inhaler Inhale 2 puffs into the lungs 2 (two) times daily.    . budesonide (RHINOCORT AQUA) 32 MCG/ACT nasal spray Place 1 spray into the nose as needed. 1 Bottle 3  . Cholecalciferol (VITAMIN D PO) Take 1 tablet by mouth daily.    . clonazePAM (KLONOPIN) 0.5 MG tablet Take 0.5 mg by mouth at bedtime as needed.    . hydrochlorothiazide (HYDRODIURIL) 25 MG tablet TAKE 1 TABLET(25 MG) BY MOUTH DAILY 90 tablet 0  . latanoprost (XALATAN) 0.005 % ophthalmic solution Place 1 drop into both eyes daily.    . traZODone (  DESYREL) 150 MG tablet Take 150 mg by mouth at bedtime.    Marland Kitchen venlafaxine XR (EFFEXOR-XR) 150 MG 24 hr capsule Take one tablet by mouth once daily    . venlafaxine XR (EFFEXOR-XR) 37.5 MG 24 hr capsule Take 1 capsule (37.5 mg total) by mouth daily with breakfast. 30 capsule 3   No current facility-administered medications for this visit.     PHYSICAL EXAMINATION: ECOG PERFORMANCE STATUS: 1 - Symptomatic but completely ambulatory  Vitals:    11/05/16 1110  BP: (!) 146/74  Pulse: 97  Resp: 18  Temp: 98.3 F (36.8 C)   Filed Weights   11/05/16 1110  Weight: 122 lb 11.2 oz (55.7 kg)    GENERAL:alert, no distress and comfortable SKIN: skin color, texture, turgor are normal, no rashes or significant lesions EYES: normal, Conjunctiva are pink and non-injected, sclera clear OROPHARYNX:no exudate, no erythema and lips, buccal mucosa, and tongue normal  NECK: supple, thyroid normal size, non-tender, without nodularity LYMPH:  no palpable lymphadenopathy in the cervical, axillary or inguinal LUNGS: clear to auscultation and percussion with normal breathing effort HEART: regular rate & rhythm and no murmurs and no lower extremity edema ABDOMEN:abdomen soft, non-tender and normal bowel sounds MUSCULOSKELETAL:no cyanosis of digits and no clubbing  NEURO: alert & oriented x 3 with fluent speech, no focal motor/sensory deficits EXTREMITIES: No lower extremity edema BREAST: No palpable masses or nodules in either right or left breasts. No palpable axillary supraclavicular or infraclavicular adenopathy no breast tenderness or nipple discharge. (exam performed in the presence of a chaperone)  LABORATORY DATA:  I have reviewed the data as listed   Chemistry      Component Value Date/Time   NA 140 11/06/2015 1039   K 3.4 (L) 11/06/2015 1039   CL 102 08/10/2015 0856   CL 101 03/01/2013 1359   CO2 31 (H) 11/06/2015 1039   BUN 17.0 11/06/2015 1039   CREATININE 0.8 11/06/2015 1039      Component Value Date/Time   CALCIUM 9.2 11/06/2015 1039   ALKPHOS 56 11/06/2015 1039   AST 17 11/06/2015 1039   ALT 12 11/06/2015 1039   BILITOT 0.56 11/06/2015 1039       Lab Results  Component Value Date   WBC 10.8 (H) 11/06/2015   HGB 13.6 11/06/2015   HCT 41.6 11/06/2015   MCV 89.4 11/06/2015   PLT 272 11/06/2015   NEUTROABS 8.4 (H) 11/06/2015   ASSESSMENT & PLAN:  Breast cancer of lower-outer quadrant of right female breast  ([place]) Right-sided breast cancer diagnosed 1996 treated with lumpectomy adjuvant chemotherapy with a.c. x4 followed by radiation and tamoxifen for 5 years completed 2001  Ductal carcinoma in situ (DCIS) of left breast Left breast lumpectomy revealed DCIS solid and cribriform types with comedonecrosis 1.1 cm [name], [name] status post lumpectomy and then placed on tamoxifen 20 mg daily. Her treatment was done at [place] moved to [facility] 09/28/2012  Vaginal dryness: This has resolved since she stopped tamoxifen  Surveillance: Today's breast exam was normal. mammograms done 11/15/2015. DEXA scan revealed normal bone density. Bone scan, Xray and Ct chest dec 2015: Deg changes  Survivorship:Discussed the importance of physical exercise in decreasing the likelihood of breast cancer recurrence. Recommended 30 mins daily 6 days a week of either brisk walking or cycling or swimming. Encouraged patient to eat more fruits and vegetables and decrease red meat. Patient's husband was diagnosed with prostate cancer and has been undergoing radiation therapy. She is quite sad about  his diagnosis. I reassured her that hopefully with radiation and anti-testosterone therapy that he will do well.  Return to clinic in one year with survivorship clinic.  No orders of the defined types were placed in this encounter.  The patient has a good understanding of the overall plan. she agrees with it. she will call with any problems that may develop before the next visit here.   Rulon Eisenmenger, MD 11/05/16

## 2016-11-15 ENCOUNTER — Ambulatory Visit
Admission: RE | Admit: 2016-11-15 | Discharge: 2016-11-15 | Disposition: A | Discharge status: Home / Self Care | Payer: Medicare Other | Source: Ambulatory Visit | Attending: Hematology and Oncology | Admitting: Hematology and Oncology

## 2016-11-15 DIAGNOSIS — Z853 Personal history of malignant neoplasm of breast: Secondary | ICD-10-CM

## 2016-12-04 ENCOUNTER — Other Ambulatory Visit: Payer: Self-pay | Admitting: Internal Medicine

## 2016-12-10 DIAGNOSIS — L738 Other specified follicular disorders: Secondary | ICD-10-CM | POA: Diagnosis not present

## 2016-12-10 DIAGNOSIS — D225 Melanocytic nevi of trunk: Secondary | ICD-10-CM | POA: Diagnosis not present

## 2016-12-10 DIAGNOSIS — L578 Other skin changes due to chronic exposure to nonionizing radiation: Secondary | ICD-10-CM | POA: Diagnosis not present

## 2016-12-10 DIAGNOSIS — L814 Other melanin hyperpigmentation: Secondary | ICD-10-CM | POA: Diagnosis not present

## 2016-12-10 DIAGNOSIS — L821 Other seborrheic keratosis: Secondary | ICD-10-CM | POA: Diagnosis not present

## 2016-12-10 DIAGNOSIS — L570 Actinic keratosis: Secondary | ICD-10-CM | POA: Diagnosis not present

## 2017-01-13 DIAGNOSIS — H35363 Drusen (degenerative) of macula, bilateral: Secondary | ICD-10-CM | POA: Diagnosis not present

## 2017-01-13 DIAGNOSIS — Z961 Presence of intraocular lens: Secondary | ICD-10-CM | POA: Diagnosis not present

## 2017-01-13 DIAGNOSIS — H04123 Dry eye syndrome of bilateral lacrimal glands: Secondary | ICD-10-CM | POA: Diagnosis not present

## 2017-01-13 DIAGNOSIS — H401431 Capsular glaucoma with pseudoexfoliation of lens, bilateral, mild stage: Secondary | ICD-10-CM | POA: Diagnosis not present

## 2017-03-13 DIAGNOSIS — H1013 Acute atopic conjunctivitis, bilateral: Secondary | ICD-10-CM | POA: Diagnosis not present

## 2017-03-13 DIAGNOSIS — H04123 Dry eye syndrome of bilateral lacrimal glands: Secondary | ICD-10-CM | POA: Diagnosis not present

## 2017-03-13 DIAGNOSIS — H401431 Capsular glaucoma with pseudoexfoliation of lens, bilateral, mild stage: Secondary | ICD-10-CM | POA: Diagnosis not present

## 2017-04-08 ENCOUNTER — Ambulatory Visit (INDEPENDENT_AMBULATORY_CARE_PROVIDER_SITE_OTHER): Payer: PPO | Admitting: Internal Medicine

## 2017-04-08 ENCOUNTER — Encounter: Payer: Self-pay | Admitting: Internal Medicine

## 2017-04-08 ENCOUNTER — Other Ambulatory Visit: Payer: PPO

## 2017-04-08 DIAGNOSIS — I10 Essential (primary) hypertension: Secondary | ICD-10-CM

## 2017-04-08 DIAGNOSIS — J4520 Mild intermittent asthma, uncomplicated: Secondary | ICD-10-CM

## 2017-04-08 MED ORDER — ZOSTER VAC RECOMB ADJUVANTED 50 MCG/0.5ML IM SUSR: 0.5000 mL | Freq: Once | INTRAMUSCULAR | 1 refills | Status: AC

## 2017-04-08 NOTE — Assessment & Plan Note (Signed)
BP at goal on hctz. Needs labs and reminded her to get them drawn today. Adjust as needed.

## 2017-04-08 NOTE — Progress Notes (Signed)
   Subjective:    Patient ID: Jo Morrison, female    DOB: 21-Jul-1946, 71 y.o.   MRN: [id number]  HPI The patient is a 71 YO female coming in for follow up of her blood pressure (taking hctz daily and no side effects, denies chest pains or SOB) and her allergies (doing better with maintaining her regimen to prevent flare, no increase in albuterol or breathing problems, mild congestion). No new concerns.   Review of Systems  Constitutional: Negative for activity change, appetite change, fatigue, fever and unexpected weight change.  Respiratory: Negative.   Cardiovascular: Negative.   Gastrointestinal: Negative.   Musculoskeletal: Negative.   Skin: Negative.   Neurological: Negative.       Objective:   Physical Exam  Constitutional: She is oriented to person, place, and time. She appears well-developed and well-nourished.  HENT:  Head: Normocephalic and atraumatic.  Eyes: EOM are normal.  Neck: Normal range of motion.  Cardiovascular: Normal rate and regular rhythm.   Pulmonary/Chest: Effort normal and breath sounds normal. No respiratory distress. She has no wheezes. She has no rales.  Abdominal: Soft.  Neurological: She is alert and oriented to person, place, and time.  Skin: Skin is warm and dry.   Vitals:   04/08/17 1035  BP: 124/68  Pulse: 98  Resp: 12  Temp: 98.4 F (36.9 C)  TempSrc: Oral  SpO2: 99%  Weight: 120 lb (54.4 kg)  Height: 5\' 3"  (1.6 m)      Assessment & Plan:

## 2017-04-08 NOTE — Assessment & Plan Note (Signed)
No flare today and encouraged her to continue with allergy maintenance to help avoid flare.

## 2017-04-08 NOTE — Patient Instructions (Signed)
We will get you to do the labs today.   We have given you the shingles prescription to get at the pharmacy. Get the 1st shot then the 2nd shot at least 2 months later.

## 2017-04-24 ENCOUNTER — Other Ambulatory Visit (INDEPENDENT_AMBULATORY_CARE_PROVIDER_SITE_OTHER): Payer: PPO

## 2017-04-24 DIAGNOSIS — Z0000 Encounter for general adult medical examination without abnormal findings: Secondary | ICD-10-CM | POA: Diagnosis not present

## 2017-04-24 LAB — COMPREHENSIVE METABOLIC PANEL
ALT: 22 U/L (ref 0–35)
AST: 26 U/L (ref 0–37)
Albumin: 4.3 g/dL (ref 3.5–5.2)
Alkaline Phosphatase: 73 U/L (ref 39–117)
BUN: 17 mg/dL (ref 6–23)
CO2: 32 mEq/L (ref 19–32)
Calcium: 9.4 mg/dL (ref 8.4–10.5)
Chloride: 97 mEq/L (ref 96–112)
Creatinine, Ser: 0.75 mg/dL (ref 0.40–1.20)
GFR: 80.91 mL/min (ref >60.00)
Glucose, Bld: 113 mg/dL — ABNORMAL HIGH (ref 70–99)
Potassium: 3.7 mEq/L (ref 3.5–5.1)
Sodium: 137 mEq/L (ref 135–145)
Total Bilirubin: 0.8 mg/dL (ref 0.2–1.2)
Total Protein: 7.3 g/dL (ref 6.0–8.3)

## 2017-04-24 LAB — CBC
HCT: 42.6 % (ref 36.0–46.0)
Hemoglobin: 14.5 g/dL (ref 12.0–15.0)
MCHC: 34 g/dL (ref 30.0–36.0)
MCV: 89.7 fl (ref 78.0–100.0)
Platelets: 257 10*3/uL (ref 150.0–400.0)
RBC: 4.74 Mil/uL (ref 3.87–5.11)
RDW: 13.9 % (ref 11.5–15.5)
WBC: 9.5 10*3/uL (ref 4.0–10.5)

## 2017-04-24 LAB — LIPID PANEL
Cholesterol: 200 mg/dL (ref 0–200)
HDL: 83.3 mg/dL (ref >39.00)
LDL Cholesterol: 96 mg/dL (ref 0–99)
NonHDL: 117.1
Total CHOL/HDL Ratio: 2
Triglycerides: 107 mg/dL (ref 0.0–149.0)
VLDL: 21.4 mg/dL (ref 0.0–40.0)

## 2017-05-06 DIAGNOSIS — H401431 Capsular glaucoma with pseudoexfoliation of lens, bilateral, mild stage: Secondary | ICD-10-CM | POA: Diagnosis not present

## 2017-05-06 DIAGNOSIS — H04123 Dry eye syndrome of bilateral lacrimal glands: Secondary | ICD-10-CM | POA: Diagnosis not present

## 2017-05-26 ENCOUNTER — Ambulatory Visit (INDEPENDENT_AMBULATORY_CARE_PROVIDER_SITE_OTHER): Payer: PPO | Admitting: Family Medicine

## 2017-05-26 ENCOUNTER — Encounter: Payer: Self-pay | Admitting: Family Medicine

## 2017-05-26 VITALS — BP 152/96 | HR 80 | Temp 98.5°F | Ht 63.0 in | Wt 125.8 lb

## 2017-05-26 DIAGNOSIS — N390 Urinary tract infection, site not specified: Secondary | ICD-10-CM | POA: Diagnosis not present

## 2017-05-26 DIAGNOSIS — I10 Essential (primary) hypertension: Secondary | ICD-10-CM

## 2017-05-26 DIAGNOSIS — R300 Dysuria: Secondary | ICD-10-CM

## 2017-05-26 LAB — POC URINALSYSI DIPSTICK (AUTOMATED)
Bilirubin, UA: NEGATIVE
Glucose, UA: NEGATIVE
Ketones, UA: NEGATIVE
Leukocytes, UA: NEGATIVE
Nitrite, UA: NEGATIVE
Spec Grav, UA: 1.025 (ref 1.010–1.025)
Urobilinogen, UA: 0.2 E.U./dL
pH, UA: 6 (ref 5.0–8.0)

## 2017-05-26 MED ORDER — CEPHALEXIN 500 MG PO CAPS: 500.0000 mg | ORAL_CAPSULE | Freq: Three times a day (TID) | ORAL | 0 refills | Status: DC

## 2017-05-26 NOTE — Patient Instructions (Signed)
Likely UTI  Antibiotic for 7 days  If not improving by Thursday or if symptoms worsen please let us know.

## 2017-05-26 NOTE — Progress Notes (Signed)
Subjective:  Jo Morrison is a 71 y.o. year old very pleasant female patient who presents for/with See problem oriented charting ROS- see ROS below HPI in problem oriented charting   Past Medical History-  Patient Active Problem List   Diagnosis Date Noted  . Routine general medical examination at a health care facility 08/16/2016  . Carpal tunnel syndrome, right 02/08/2016  . Vaginal dryness, menopausal 11/06/2015  . Ductal carcinoma in situ (DCIS) of left breast 10/28/2014  . Contracture of palmar fascia (Dupuytren's) 07/13/2014  . Hypertension   . Anxiety   . Depressive disorder   . Asthma   . Osteoarthritis   . Glaucoma   . Breast cancer of lower-outer quadrant of right female breast ([place]) 10/12/2012    Medications- reviewed and updated Current Outpatient Prescriptions  Medication Sig Dispense Refill  . Albuterol Sulfate (PROAIR RESPICLICK) 008 (90 BASE) MCG/ACT AEPB Inhale 2 puffs into the lungs every 4 (four) hours as needed (SOB). 1 each 0  . budesonide (PULMICORT) 180 MCG/ACT inhaler Inhale 2 puffs into the lungs 2 (two) times daily.    . budesonide (RHINOCORT AQUA) 32 MCG/ACT nasal spray Place 1 spray into the nose as needed. 1 Bottle 3  . Cholecalciferol (VITAMIN D PO) Take 1 tablet by mouth daily.    . clonazePAM (KLONOPIN) 0.5 MG tablet Take 0.5 mg by mouth at bedtime as needed.    . hydrochlorothiazide (HYDRODIURIL) 25 MG tablet TAKE 1 TABLET(25 MG) BY MOUTH DAILY 90 tablet 1  . traZODone (DESYREL) 150 MG tablet Take 150 mg by mouth at bedtime.    Marland Kitchen venlafaxine XR (EFFEXOR-XR) 150 MG 24 hr capsule Take one tablet by mouth once daily     No current facility-administered medications for this visit.     Objective: BP (!) 152/96 (BP Location: Left Arm, Patient Position: Sitting, Cuff Size: Normal)   Pulse 80   Temp 98.5 F (36.9 C) (Oral)   Ht 5\' 3"  (1.6 m)   Wt 125 lb 12.8 oz (57.1 kg)   SpO2 95%   BMI 22.28 kg/m  Gen: NAD, resting comfortably CV: RRR no  murmurs rubs or gallops Lungs: CTAB no crackles, wheeze, rhonchi Abdomen: soft/nontender other than mild suprapubic pain/nondistended/normal bowel sounds. No rebound or guarding.  No CVA tenderness Ext: no edema Skin: warm, dry  Results for orders placed or performed in visit on 05/26/17 (from the past 24 hour(s))  POCT Urinalysis Dipstick (Automated)     Status: None   Collection Time: 05/26/17  4:02 PM  Result Value Ref Range   Color, UA yellow    Clarity, UA clear    Glucose, UA n    Bilirubin, UA n    Ketones, UA n    Spec Grav, UA 1.025 1.010 - 1.025   Blood, UA 3+    pH, UA 6.0 5.0 - 8.0   Protein, UA 1+    Urobilinogen, UA 0.2 0.2 or 1.0 E.U./dL   Nitrite, UA n    Leukocytes, UA Negative Negative     Assessment/Plan:  Urinary tract infection (actually with hematuria)- Plan: POCT Urinalysis Dipstick (Automated) Concern for UTI S: Patients symptoms started 3 days ago or so with frequency then started with pain today.  Complains of dysuria: yes; polyuria: yes; nocturia: 1-2x at night usually not at all; urgency: yes.  Symptoms are worsening.   No history of kidney stones.  ROS- no fever, chills, nausea, vomiting, flank pain. Slight blood in urine. Some suprapubic discomfort A/P: UA  with blood only primarily, still Likely UTI. Will get culture. Empiric treatment with: keflex 500mg  TID. Nephrolithiasis in differential but dyrusia instead of flank pain points more toward UTI.   Also has hypertension BP Readings from Last 3 Encounters:  05/26/17 (!) 152/96  04/08/17 124/68  11/05/16 (!) 146/74  Last BP controlled. I encouraged her to check some home blood pressures (get cuff or check at store) and if above 140/90 to present to follow up. She states she has many acute stressors today which could be cause of elevation. Advised PCP follow up but continue hctz  Patient to follow up if new or worsening symptoms or failure to improve.   Orders Placed This Encounter   Procedures  . Urine Culture    [facility]  . POCT Urinalysis Dipstick (Automated)    Meds ordered this encounter  Medications  . cephALEXin (KEFLEX) 500 MG capsule    Sig: Take 1 capsule (500 mg total) by mouth 3 (three) times daily.    Dispense:  21 capsule    Refill:  0    Return precautions advised.  Garret Reddish, MD

## 2017-05-28 LAB — URINE CULTURE

## 2017-06-02 ENCOUNTER — Other Ambulatory Visit: Payer: Self-pay | Admitting: Internal Medicine

## 2017-06-03 ENCOUNTER — Other Ambulatory Visit (INDEPENDENT_AMBULATORY_CARE_PROVIDER_SITE_OTHER): Payer: PPO

## 2017-06-03 DIAGNOSIS — R319 Hematuria, unspecified: Secondary | ICD-10-CM | POA: Diagnosis not present

## 2017-06-03 LAB — URINALYSIS, MICROSCOPIC ONLY

## 2017-07-08 ENCOUNTER — Encounter: Payer: Self-pay | Admitting: Family Medicine

## 2017-07-08 ENCOUNTER — Ambulatory Visit (INDEPENDENT_AMBULATORY_CARE_PROVIDER_SITE_OTHER): Payer: PPO | Admitting: Family Medicine

## 2017-07-08 VITALS — BP 142/88 | HR 91 | Temp 98.5°F | Ht 63.0 in | Wt 127.0 lb

## 2017-07-08 DIAGNOSIS — L237 Allergic contact dermatitis due to plants, except food: Secondary | ICD-10-CM | POA: Diagnosis not present

## 2017-07-08 DIAGNOSIS — I10 Essential (primary) hypertension: Secondary | ICD-10-CM | POA: Diagnosis not present

## 2017-07-08 DIAGNOSIS — H6012 Cellulitis of left external ear: Secondary | ICD-10-CM

## 2017-07-08 MED ORDER — CEPHALEXIN 500 MG PO CAPS: 500.0000 mg | ORAL_CAPSULE | Freq: Three times a day (TID) | ORAL | 0 refills | Status: AC

## 2017-07-08 MED ORDER — PREDNISONE 20 MG PO TABS: ORAL_TABLET | ORAL | 0 refills | Status: DC

## 2017-07-08 NOTE — Progress Notes (Signed)
Subjective:  Jo Morrison is a 71 y.o. year old very pleasant female patient who presents for/with See problem oriented charting ROS- no fever or chills. Has ear pain, redness, swelling on the left. Does have pruritis.    Past Medical History-  Patient Active Problem List   Diagnosis Date Noted  . Routine general medical examination at a health care facility 08/16/2016  . Carpal tunnel syndrome, right 02/08/2016  . Vaginal dryness, menopausal 11/06/2015  . Ductal carcinoma in situ (DCIS) of left breast 10/28/2014  . Contracture of palmar fascia (Dupuytren's) 07/13/2014  . Hypertension   . Anxiety   . Depressive disorder   . Asthma   . Osteoarthritis   . Glaucoma   . Breast cancer of lower-outer quadrant of right female breast ([place]) 10/12/2012    Medications- reviewed and updated Current Outpatient Prescriptions  Medication Sig Dispense Refill  . Albuterol Sulfate (PROAIR RESPICLICK) 009 (90 BASE) MCG/ACT AEPB Inhale 2 puffs into the lungs every 4 (four) hours as needed (SOB). 1 each 0  . budesonide (PULMICORT) 180 MCG/ACT inhaler Inhale 2 puffs into the lungs 2 (two) times daily.    . budesonide (RHINOCORT AQUA) 32 MCG/ACT nasal spray Place 1 spray into the nose as needed. 1 Bottle 3  . Cholecalciferol (VITAMIN D PO) Take 1 tablet by mouth daily.    . clonazePAM (KLONOPIN) 0.5 MG tablet Take 0.5 mg by mouth at bedtime as needed.    . hydrochlorothiazide (HYDRODIURIL) 25 MG tablet TAKE 1 TABLET(25 MG) BY MOUTH DAILY 90 tablet 0  . traZODone (DESYREL) 150 MG tablet Take 150 mg by mouth at bedtime.    Marland Kitchen venlafaxine XR (EFFEXOR-XR) 150 MG 24 hr capsule Take one tablet by mouth once daily     No current facility-administered medications for this visit.     Objective: BP (!) 142/88 (BP Location: Left Arm, Patient Position: Sitting, Cuff Size: Large)   Pulse 91   Temp 98.5 F (36.9 C) (Oral)   Ht 5\' 3"  (1.6 m)   Wt 127 lb (57.6 kg)   SpO2 95%   BMI 22.50 kg/m  Gen: NAD, resting  comfortably Left external ear swollen, erythematous, tender to touch, some crusting, several small vesicles CV: RRR no murmurs rubs or gallops Lungs: CTAB no crackles, wheeze, rhonchi Ext: no edema Skin: warm, dry Neuro: hearing intact  Assessment/Plan:  Cellulitis of left external ear  Plant allergic contact dermatitis  Essential hypertension S: was camping on Friday. By Sunday noted itchy red rash on left ear with small fluid filled pouches (vesicles). Very itchy at first- ear became progressively red and warm as well as painful while itching persisted. Benadryl cream helped only slightly- pain has persisted. Feels well overall. Hearing not affected.   Had shingles shot about 3 weeks ago A/P: vesicular but doubt shingles with only ear affected at present.  Patient Instructions  Possible initial allergic reaction (itching, vesicles) to plant while camping   Appears to have developed into bacterial infection (pain, expanding redness, warmth)  Treat allergic side with prednisone for 10 days. Treat bacterial side with keflex for 7 days. See [place] back for new or worsening symptoms or if not improving by 8/16   Hypertension Compliant with meds as above. Systolic slightly high despite this. Likely increased due to acute pain from ear- should follow up with PCP after resolution of acute illness (was high in last visit as well). Reports checks at home and usually 120/80 so ok to continue to monitor for  now.   Meds ordered this encounter  Medications  . cephALEXin (KEFLEX) 500 MG capsule    Sig: Take 1 capsule (500 mg total) by mouth 3 (three) times daily.    Dispense:  21 capsule    Refill:  0  . predniSONE (DELTASONE) 20 MG tablet    Sig: Take 1 tablet by mouth daily for 4 days, then 1/2 tablet daily for 6 days    Dispense:  7 tablet    Refill:  0    Return precautions advised.  Garret Reddish, MD

## 2017-07-08 NOTE — Patient Instructions (Signed)
Possible initial allergic reaction (itching, vesicles) to plant while camping   Appears to have developed into bacterial infection (pain, expanding redness, warmth)  Treat allergic side with prednisone for 10 days. Treat bacterial side with keflex for 7 days. See [place] back for new or worsening symptoms or if not improving by 8/16

## 2017-07-08 NOTE — Assessment & Plan Note (Signed)
Compliant with meds as above. Systolic slightly high despite this. Likely increased due to acute pain from ear- should follow up with PCP after resolution of acute illness (was high in last visit as well). Reports checks at home and usually 120/80 so ok to continue to monitor for now.

## 2017-08-15 DIAGNOSIS — M25511 Pain in right shoulder: Secondary | ICD-10-CM | POA: Diagnosis not present

## 2017-08-15 DIAGNOSIS — M12811 Other specific arthropathies, not elsewhere classified, right shoulder: Secondary | ICD-10-CM | POA: Diagnosis not present

## 2017-08-15 DIAGNOSIS — G8929 Other chronic pain: Secondary | ICD-10-CM | POA: Diagnosis not present

## 2017-08-25 ENCOUNTER — Other Ambulatory Visit: Payer: Self-pay | Admitting: Family

## 2017-09-09 ENCOUNTER — Ambulatory Visit (INDEPENDENT_AMBULATORY_CARE_PROVIDER_SITE_OTHER): Payer: PPO

## 2017-09-09 DIAGNOSIS — Z23 Encounter for immunization: Secondary | ICD-10-CM | POA: Diagnosis not present

## 2017-09-16 DIAGNOSIS — M12811 Other specific arthropathies, not elsewhere classified, right shoulder: Secondary | ICD-10-CM | POA: Diagnosis not present

## 2017-09-19 DIAGNOSIS — M12811 Other specific arthropathies, not elsewhere classified, right shoulder: Secondary | ICD-10-CM | POA: Diagnosis not present

## 2017-09-22 DIAGNOSIS — M12811 Other specific arthropathies, not elsewhere classified, right shoulder: Secondary | ICD-10-CM | POA: Diagnosis not present

## 2017-09-24 DIAGNOSIS — M12811 Other specific arthropathies, not elsewhere classified, right shoulder: Secondary | ICD-10-CM | POA: Diagnosis not present

## 2017-10-02 DIAGNOSIS — M12811 Other specific arthropathies, not elsewhere classified, right shoulder: Secondary | ICD-10-CM | POA: Diagnosis not present

## 2017-10-03 DIAGNOSIS — M12811 Other specific arthropathies, not elsewhere classified, right shoulder: Secondary | ICD-10-CM | POA: Diagnosis not present

## 2017-10-03 DIAGNOSIS — M25511 Pain in right shoulder: Secondary | ICD-10-CM | POA: Diagnosis not present

## 2017-10-03 DIAGNOSIS — G8929 Other chronic pain: Secondary | ICD-10-CM | POA: Diagnosis not present

## 2017-10-06 DIAGNOSIS — M12811 Other specific arthropathies, not elsewhere classified, right shoulder: Secondary | ICD-10-CM | POA: Diagnosis not present

## 2017-10-07 NOTE — Progress Notes (Signed)
Subjective:   Jo Morrison is a 71 y.o. female who presents for Medicare Annual (Subsequent) preventive examination.  Review of Systems:  No ROS.  Medicare Wellness Visit. Additional risk factors are reflected in the social history.    Sleep patterns: feels rested on waking, gets up 1 times nightly to void and sleeps 7-8 hours nightly.    Home Safety/Smoke Alarms: Feels safe in home. Smoke alarms in place.  Living environment; residence and Firearm Safety: 2-story house, no firearms. Lives with wife, no needs for DME, good support system Seat Belt Safety/Bike Helmet: Wears seat belt.    Objective:     Vitals: There were no vitals taken for this visit.  There is no height or weight on file to calculate BMI.   Tobacco Social History   Tobacco Use  Smoking Status Never Smoker  Smokeless Tobacco Never Used     Counseling given: Not Answered   Past Medical History:  Diagnosis Date  . Anxiety   . Asthma   . Breast cancer ([place]) 2012   left  . Breast cancer, right ([place]) 1996   right  . Depressive disorder   . DVT (deep venous thrombosis) ([place])   . Glaucoma   . Hypertension   . Olecranon bursitis   . Osteoarthritis   . Other disorders of bone and cartilage(733.99)   . Seasonal allergies   . Unspecified pruritic disorder    Past Surgical History:  Procedure Laterality Date  . APPENDECTOMY  1958  . BREAST LUMPECTOMY  1996   Dr. Corena Herter in [place]  . BREAST LUMPECTOMY  2011   Dr. Pleas Patricia  . COLONOSCOPY  2011-last  . ROTATOR CUFF REPAIR  2007  . TONSILLECTOMY  1967   Family History  Problem Relation Age of Onset  . Stroke Father 56  . Heart disease Brother   . Colon cancer Maternal Aunt 70   Social History   Substance and Sexual Activity  Sexual Activity Not Currently    Outpatient Encounter Medications as of 10/08/2017  Medication Sig  . Albuterol Sulfate (PROAIR RESPICLICK) 950 (90 BASE) MCG/ACT AEPB Inhale 2 puffs into the lungs every 4 (four)  hours as needed (SOB).  . budesonide (PULMICORT) 180 MCG/ACT inhaler Inhale 2 puffs into the lungs 2 (two) times daily.  . budesonide (RHINOCORT AQUA) 32 MCG/ACT nasal spray Place 1 spray into the nose as needed.  . Cholecalciferol (VITAMIN D PO) Take 1 tablet by mouth daily.  . clonazePAM (KLONOPIN) 0.5 MG tablet Take 0.5 mg by mouth at bedtime as needed.  . hydrochlorothiazide (HYDRODIURIL) 25 MG tablet TAKE 1 TABLET(25 MG) BY MOUTH DAILY  . predniSONE (DELTASONE) 20 MG tablet Take 1 tablet by mouth daily for 4 days, then 1/2 tablet daily for 6 days  . traZODone (DESYREL) 150 MG tablet Take 150 mg by mouth at bedtime.  Marland Kitchen venlafaxine XR (EFFEXOR-XR) 150 MG 24 hr capsule Take one tablet by mouth once daily   No facility-administered encounter medications on file as of 10/08/2017.     Activities of Daily Living No flowsheet data found.  Patient Care Team: Hoyt Koch, MD as PCP - General (Internal Medicine)    Assessment:    Physical assessment deferred to PCP.  Exercise Activities and Dietary recommendations   Diet (meal preparation, eat out, water intake, caffeinated beverages, dairy products, fruits and vegetables): in general, a "healthy" diet  , well balanced, eats a variety of fruits and vegetables daily, limits salt, fat/cholesterol, sugar,  caffeine, drinks 6-8 glasses of water daily.  Goals    None     Fall Risk Fall Risk  07/08/2017 02/07/2016 10/28/2014 06/14/2014 12/15/2013  Falls in the past year? No Yes No No No  Number falls in past yr: - 1 - - -  Injury with Fall? - No - - -   Depression Screen PHQ 2/9 Scores 07/08/2017 02/07/2016  PHQ - 2 Score 0 0     Cognitive Function       Ad8 score reviewed for issues:  Issues making decisions: no  Less interest in hobbies / activities: no  Repeats questions, stories (family complaining): no  Trouble using ordinary gadgets (microwave, computer, phone):no  Forgets the month or year: no  Mismanaging  finances: no  Remembering appts: no  Daily problems with thinking and/or memory: no Ad8 score is= 0  Immunization History  Administered Date(s) Administered  . Influenza, High Dose Seasonal PF 08/09/2016, 09/09/2017  . Influenza,inj,Quad PF,6+ Mos 08/10/2015  . Influenza-Unspecified 08/25/2010, 09/15/2012  . Pneumococcal Conjugate-13 12/13/2014  . Pneumococcal Polysaccharide-23 06/16/2013  . Tdap 11/25/1998, 08/10/2015  . Zoster 05/27/2012  . Zoster Recombinat (Shingrix) 06/22/2017, 09/10/2017   Screening Tests Health Maintenance  Topic Date Due  . Hepatitis C Screening  08/16/2026 (Originally [birth year])  . COLONOSCOPY  12/09/2017  . MAMMOGRAM  11/15/2018  . TETANUS/TDAP  08/09/2025  . INFLUENZA VACCINE  Completed  . DEXA SCAN  Completed  . PNA vac Low Risk Adult  Completed      Plan:     Continue doing brain stimulating activities (puzzles, reading, adult coloring books, staying active) to keep memory sharp.   Continue to eat heart healthy diet (full of fruits, vegetables, whole grains, lean protein, water--limit salt, fat, and sugar intake) and increase physical activity as tolerated.  I have personally reviewed and noted the following in the patient's chart:   . Medical and social history . Use of alcohol, tobacco or illicit drugs  . Current medications and supplements . Functional ability and status . Nutritional status . Physical activity . Advanced directives . List of other physicians . Vitals . Screenings to include cognitive, depression, and falls . Referrals and appointments  In addition, I have reviewed and discussed with patient certain preventive protocols, quality metrics, and best practice recommendations. A written personalized care plan for preventive services as well as general preventive health recommendations were provided to patient.     Michiel Cowboy, RN  10/07/2017

## 2017-10-08 ENCOUNTER — Ambulatory Visit: Payer: PPO | Admitting: Internal Medicine

## 2017-10-08 ENCOUNTER — Encounter: Payer: Self-pay | Admitting: Internal Medicine

## 2017-10-08 VITALS — BP 118/70 | HR 89 | Temp 98.4°F | Resp 18 | Ht 63.0 in | Wt 125.0 lb

## 2017-10-08 DIAGNOSIS — J4520 Mild intermittent asthma, uncomplicated: Secondary | ICD-10-CM

## 2017-10-08 DIAGNOSIS — I10 Essential (primary) hypertension: Secondary | ICD-10-CM | POA: Diagnosis not present

## 2017-10-08 DIAGNOSIS — Z0000 Encounter for general adult medical examination without abnormal findings: Secondary | ICD-10-CM

## 2017-10-08 MED ORDER — MONTELUKAST SODIUM 10 MG PO TABS
10.0000 mg | ORAL_TABLET | Freq: Every day | ORAL | 3 refills | Status: DC
Start: 2017-10-08 — End: 2018-09-15

## 2017-10-08 NOTE — Progress Notes (Signed)
Patient ID: Jo Morrison, female   DOB: [birth year], 71 y.o.   MRN: [id number] Medical screening examination/treatment/procedure(s) were performed by non-physician practitioner and as supervising physician I was immediately available for consultation/collaboration. I agree with above. Hoyt Koch, MD

## 2017-10-08 NOTE — Assessment & Plan Note (Signed)
Rx for singulair,has pulmicort which she is not taking now. No exacerbation.

## 2017-10-08 NOTE — Patient Instructions (Addendum)
Continue doing brain stimulating activities (puzzles, reading, adult coloring books, staying active) to keep memory sharp.   Continue to eat heart healthy diet (full of fruits, vegetables, whole grains, lean protein, water--limit salt, fat, and sugar intake) and increase physical activity as tolerated.   Ms. Kastens , Thank you for taking time to come for your Medicare Wellness Visit. I appreciate your ongoing commitment to your health goals. Please review the following plan we discussed and let me know if I can assist you in the future.   These are the goals we discussed: Increase my physical activity by planning to go back to the gym and go x3 weekly.  Goals    None      This is a list of the screening recommended for you and due dates:  Health Maintenance  Topic Date Due  .  Hepatitis C: One time screening is recommended by Center for Disease Control  (CDC) for  adults born from [birth year] through 1965.   08/16/2026*  . Colon Cancer Screening  12/09/2017  . Mammogram  11/15/2018  . Tetanus Vaccine  08/09/2025  . Flu Shot  Completed  . DEXA scan (bone density measurement)  Completed  . Pneumonia vaccines  Completed  *Topic was postponed. The date shown is not the original due date.

## 2017-10-08 NOTE — Assessment & Plan Note (Signed)
Taking HCTZ daily and last CMP without indication for change.

## 2017-10-08 NOTE — Assessment & Plan Note (Signed)
Flu and tetanus and pneumonia and shingles up to date. Colonoscopy up to date and mammogram up to date. Counseled about sun safety and mole surveillance. Given 10 year screening recommendations.

## 2017-10-08 NOTE — Progress Notes (Signed)
   Subjective:    Patient ID: Jo Morrison, female    DOB: December 24, 1945, 71 y.o.   MRN: [id number]  HPI The patient is a 71 YO female coming in for physical.  PMH, FMH,social history reviewed and updated.  Review of Systems  Constitutional: Negative.   HENT: Negative.   Eyes: Negative.   Respiratory: Negative for cough, chest tightness and shortness of breath.   Cardiovascular: Negative for chest pain, palpitations and leg swelling.  Gastrointestinal: Negative for abdominal distention, abdominal pain, constipation, diarrhea, nausea and vomiting.  Genitourinary: Positive for enuresis.  Musculoskeletal: Negative.   Skin: Negative.   Neurological: Negative.   Psychiatric/Behavioral: Negative.       Objective:   Physical Exam  Constitutional: She is oriented to person, place, and time. She appears well-developed and well-nourished.  HENT:  Head: Normocephalic and atraumatic.  Eyes: EOM are normal.  Neck: Normal range of motion.  Cardiovascular: Normal rate and regular rhythm.  Pulmonary/Chest: Effort normal and breath sounds normal. No respiratory distress. She has no wheezes. She has no rales.  Abdominal: Soft. Bowel sounds are normal. She exhibits no distension. There is no tenderness. There is no rebound.  Musculoskeletal: She exhibits no edema.  Neurological: She is alert and oriented to person, place, and time. Coordination normal.  Skin: Skin is warm and dry.  Psychiatric: She has a normal mood and affect.   Vitals:   10/08/17 0824  BP: 118/70  Pulse: 89  Resp: 18  Temp: 98.4 F (36.9 C)  SpO2: 97%  Weight: 125 lb (56.7 kg)  Height: 5\' 3"  (1.6 m)      Assessment & Plan:

## 2017-10-09 DIAGNOSIS — M12811 Other specific arthropathies, not elsewhere classified, right shoulder: Secondary | ICD-10-CM | POA: Diagnosis not present

## 2017-10-28 ENCOUNTER — Encounter: Payer: Self-pay | Admitting: Internal Medicine

## 2017-10-28 DIAGNOSIS — R0981 Nasal congestion: Secondary | ICD-10-CM

## 2017-10-30 DIAGNOSIS — D225 Melanocytic nevi of trunk: Secondary | ICD-10-CM | POA: Diagnosis not present

## 2017-10-30 DIAGNOSIS — L570 Actinic keratosis: Secondary | ICD-10-CM | POA: Diagnosis not present

## 2017-10-30 DIAGNOSIS — L814 Other melanin hyperpigmentation: Secondary | ICD-10-CM | POA: Diagnosis not present

## 2017-11-13 DIAGNOSIS — R05 Cough: Secondary | ICD-10-CM | POA: Diagnosis not present

## 2017-11-13 DIAGNOSIS — J45998 Other asthma: Secondary | ICD-10-CM | POA: Diagnosis not present

## 2017-11-14 ENCOUNTER — Ambulatory Visit: Payer: Self-pay | Admitting: *Deleted

## 2017-11-14 ENCOUNTER — Telehealth: Payer: Self-pay | Admitting: Internal Medicine

## 2017-11-14 NOTE — Telephone Encounter (Signed)
   Reason for Disposition . [9] Systolic BP  >= 628 OR Diastolic >= 90 AND [3] taking BP medications  Answer Assessment - Initial Assessment Questions 1. BLOOD PRESSURE: "What is the blood pressure?" "Did you take at least two measurements 5 minutes apart?"     Was 152/98 this AM before taking her HCTZ. 144/94 1.5 hrs after taking AM dose. 2. ONSET: "When did you take your blood pressure?"     10 minutes prior to call back , approx. 1125 3. HOW: "How did you obtain the blood pressure?" (e.g., visiting nurse, automatic home BP monitor)     Home monitor 4. HISTORY: "Do you have a history of high blood pressure?"     yes 5. MEDICATIONS: "Are you taking any medications for blood pressure?" "Have you missed any doses recently?"     Yes, HCTZ 25 mg QD. No missed doses 6. OTHER SYMPTOMS: "Do you have any symptoms?" (e.g., headache, chest pain, blurred vision, difficulty breathing, weakness)     Mild H/A 2/10  Protocols used: HIGH BLOOD PRESSURE-A-AH

## 2017-11-14 NOTE — Telephone Encounter (Signed)
Pt called to report B/P yesterday while at ENT office was 189/?; could not recall diastolic. Office RN told pt to check at home and if elevated call PCP. This morning B/P 152/89 BEFORE taking her HCTZ 25mg . Reports "mild" headache (2/10.) Instructed pt to take medication now. Will F/U in 1-2 hours to report B/P.

## 2017-11-14 NOTE — Telephone Encounter (Addendum)
See Telephone Encounter from this AM:  Pt called back as instructed to report B/P after taking am dose of Hydrodiuril 25 mg, B/P 1.5 hours after dose 144/94. Mild headache 2/10. Reports only new medication is Singulair 10mg  she takes qHS. Made appt with Dr. Sharlet Salina 12/03/17. Instructed to keep journal of BP and to call back if symptoms become worse.

## 2017-11-14 NOTE — Telephone Encounter (Signed)
Fine

## 2017-11-14 NOTE — Telephone Encounter (Signed)
See Telephone Encounter from this AM:  Pt called back as instructed to report B/P after taking am dose of Hydrodiuril 25 mg, B/P 1.5 hours after dose 144/94. Mild headache 2/10. Reports only new medication is Singulair 10mg  she takes qHS. Made appt with Dr. Sharlet Salina 12/03/17. Instructed to keep journal of BP and to call back if symptoms become worse.

## 2017-11-27 ENCOUNTER — Ambulatory Visit (INDEPENDENT_AMBULATORY_CARE_PROVIDER_SITE_OTHER): Payer: PPO | Admitting: Internal Medicine

## 2017-11-27 ENCOUNTER — Other Ambulatory Visit: Payer: Self-pay | Admitting: Internal Medicine

## 2017-11-27 DIAGNOSIS — J4520 Mild intermittent asthma, uncomplicated: Secondary | ICD-10-CM | POA: Diagnosis not present

## 2017-11-27 LAB — PULMONARY FUNCTION TEST
DL/VA % pred: 79 %
DL/VA: 3.73 ml/min/mmHg/L
DLCO cor % pred: 69 %
DLCO cor: 15.98 ml/min/mmHg
DLCO unc % pred: 71 %
DLCO unc: 16.36 ml/min/mmHg
FEF 25-75 Post: 1.6 L/sec
FEF 25-75 Pre: 2.02 L/sec
FEF2575-%Change-Post: -20 %
FEF2575-%Pred-Post: 91 %
FEF2575-%Pred-Pre: 115 %
FEV1-%Change-Post: -3 %
FEV1-%Pred-Post: 95 %
FEV1-%Pred-Pre: 99 %
FEV1-Post: 2.03 L
FEV1-Pre: 2.1 L
FEV1FVC-%Change-Post: 2 %
FEV1FVC-%Pred-Pre: 104 %
FEV6-%Change-Post: -5 %
FEV6-%Pred-Post: 92 %
FEV6-%Pred-Pre: 98 %
FEV6-Post: 2.49 L
FEV6-Pre: 2.64 L
FEV6FVC-%Pred-Post: 105 %
FEV6FVC-%Pred-Pre: 105 %
FVC-%Change-Post: -5 %
FVC-%Pred-Post: 88 %
FVC-%Pred-Pre: 93 %
FVC-Post: 2.49 L
FVC-Pre: 2.64 L
Post FEV1/FVC ratio: 82 %
Post FEV6/FVC ratio: 100 %
Pre FEV1/FVC ratio: 80 %
Pre FEV6/FVC Ratio: 100 %
RV % pred: 110 %
RV: 2.4 L
TLC % pred: 100 %
TLC: 4.9 L

## 2017-11-27 NOTE — Progress Notes (Signed)
Patient is to have PFT completed per [facility]. Placing order under Dr. Annamaria Boots per protocol.

## 2017-11-27 NOTE — Progress Notes (Signed)
PFT completed today 11/27/17

## 2017-12-03 ENCOUNTER — Ambulatory Visit
Admission: RE | Admit: 2017-12-03 | Discharge: 2017-12-03 | Disposition: A | Discharge status: Home / Self Care | Payer: PPO | Source: Ambulatory Visit | Attending: Otolaryngology | Admitting: Otolaryngology

## 2017-12-03 ENCOUNTER — Encounter: Payer: Self-pay | Admitting: Internal Medicine

## 2017-12-03 ENCOUNTER — Ambulatory Visit (INDEPENDENT_AMBULATORY_CARE_PROVIDER_SITE_OTHER): Payer: PPO | Admitting: Internal Medicine

## 2017-12-03 ENCOUNTER — Other Ambulatory Visit: Payer: Self-pay | Admitting: Otolaryngology

## 2017-12-03 DIAGNOSIS — R05 Cough: Secondary | ICD-10-CM | POA: Diagnosis not present

## 2017-12-03 DIAGNOSIS — R059 Cough, unspecified: Secondary | ICD-10-CM

## 2017-12-03 DIAGNOSIS — I10 Essential (primary) hypertension: Secondary | ICD-10-CM | POA: Diagnosis not present

## 2017-12-03 NOTE — Patient Instructions (Signed)
We will have you keep watching the blood pressure. The average should be around 140/90.   Work on getting back into exercise.

## 2017-12-03 NOTE — Progress Notes (Signed)
   Subjective:    Patient ID: Jo Morrison, female    DOB: 1945/11/29, 72 y.o.   MRN: [id number]  HPI The patient is a 72 YO female coming in for slightly high BP at home. She went to ENT and they had readings into 170 but she has had more like 130s at home with rare 150s or 140s at home. Most diastolic readings are around 80s and rare in the 90s. She is not exercising lately since the winter. She denies headache or chest pains or SOB or abdominal pain. Denies increase in sodium intake.   Review of Systems  Constitutional: Negative.   HENT: Negative.   Eyes: Negative.   Respiratory: Positive for cough. Negative for chest tightness and shortness of breath.   Cardiovascular: Negative for chest pain, palpitations and leg swelling.  Gastrointestinal: Negative for abdominal distention, abdominal pain, constipation, diarrhea, nausea and vomiting.  Musculoskeletal: Negative.   Skin: Negative.   Neurological: Negative.   Psychiatric/Behavioral: Negative.       Objective:   Physical Exam  Constitutional: She is oriented to person, place, and time. She appears well-developed and well-nourished.  HENT:  Head: Normocephalic and atraumatic.  Eyes: EOM are normal.  Neck: Normal range of motion.  Cardiovascular: Normal rate and regular rhythm.  Pulmonary/Chest: Effort normal and breath sounds normal. No respiratory distress. She has no wheezes. She has no rales.  Abdominal: Soft. Bowel sounds are normal. She exhibits no distension. There is no tenderness. There is no rebound.  Musculoskeletal: She exhibits no edema.  Neurological: She is alert and oriented to person, place, and time. Coordination normal.  Skin: Skin is warm and dry.  Psychiatric: She has a normal mood and affect.   Vitals:   12/03/17 1031  BP: 140/90  Pulse: (!) 104  Temp: 98.7 F (37.1 C)  TempSrc: Oral  SpO2: 99%  Weight: 124 lb (56.2 kg)  Height: 5\' 3"  (1.6 m)      Assessment & Plan:

## 2017-12-04 ENCOUNTER — Telehealth: Payer: Self-pay | Admitting: Adult Health

## 2017-12-04 ENCOUNTER — Encounter: Payer: Self-pay | Admitting: Adult Health

## 2017-12-04 ENCOUNTER — Encounter: Payer: Medicare Other | Admitting: Adult Health

## 2017-12-04 ENCOUNTER — Inpatient Hospital Stay: Payer: PPO | Attending: Adult Health | Admitting: Adult Health

## 2017-12-04 VITALS — BP 167/81 | HR 81 | Temp 98.3°F | Resp 20 | Wt 125.8 lb

## 2017-12-04 DIAGNOSIS — Z9221 Personal history of antineoplastic chemotherapy: Secondary | ICD-10-CM

## 2017-12-04 DIAGNOSIS — Z853 Personal history of malignant neoplasm of breast: Secondary | ICD-10-CM | POA: Diagnosis not present

## 2017-12-04 DIAGNOSIS — Z170 Estrogen receptor positive status [ER+]: Secondary | ICD-10-CM

## 2017-12-04 DIAGNOSIS — Z923 Personal history of irradiation: Secondary | ICD-10-CM | POA: Diagnosis not present

## 2017-12-04 DIAGNOSIS — C50511 Malignant neoplasm of lower-outer quadrant of right female breast: Secondary | ICD-10-CM

## 2017-12-04 NOTE — Telephone Encounter (Signed)
Gave patient avs and calendar with appts per 1/10 los °

## 2017-12-04 NOTE — Progress Notes (Signed)
CLINIC:  Survivorship   REASON FOR VISIT:  Routine follow-up for history of breast cancer.   BRIEF ONCOLOGIC HISTORY:    Breast cancer of lower-outer quadrant of right female breast ([place])   05/29/1995 Surgery    The right breast invasive ductal carcinoma [name] [name] status post right lumpectomy with axillary lymph node dissection and chemotherapy with 4 cycles of AC, adjuvant radiation and 5 years tamoxifen      08/07/1995 - 10/07/1995 Chemotherapy    Cytoxan based chemo X 3 months      11/06/1995 - 12/19/1995 Radiation Therapy    Adjuvant XRT      07/06/1996 - 09/05/2000 Anti-estrogen oral therapy    Tamoxifen 20 mg daily x 5 years      12/13/2009 Relapse/Recurrence    Left breast lumpectomy revealed DCIS solid and cribriform types with comedonecrosis 1.1 cm [name], [name]       01/22/2010 - 02/26/2010 Radiation Therapy    Adjuvant XRT      04/05/2010 - 11/06/2015 Anti-estrogen oral therapy    Tamoxifen 20 mg daily        INTERVAL HISTORY:  Jo Morrison presents to the [facility] today for routine follow-up for her history of breast cancer.  Overall, she reports feeling quite well.  She hasn't yet had mammo.  She sees Dr. Sharlet Salina as her PCP regularly.  She does not exercise as much as she should, however, she plans to.  She is doing well though otherwise and is without any questions or concerns today.      REVIEW OF SYSTEMS:  Review of Systems  Constitutional: Negative for appetite change, chills, fatigue, fever and unexpected weight change.  HENT:   Negative for hearing loss, lump/mass, sore throat, tinnitus, trouble swallowing and voice change.   Eyes: Negative for eye problems and icterus.  Respiratory: Negative for chest tightness, cough and shortness of breath.   Cardiovascular: Negative for chest pain, leg swelling and palpitations.  Gastrointestinal: Negative for abdominal distention and abdominal pain.  Endocrine: Negative for hot  flashes.  Genitourinary: Negative for difficulty urinating.   Musculoskeletal: Negative for arthralgias.  Skin: Negative for itching and rash.  Neurological: Negative for dizziness, extremity weakness, headaches and numbness.  Hematological: Negative for adenopathy. Does not bruise/bleed easily.  Psychiatric/Behavioral: Negative for depression. The patient is not nervous/anxious.   Breast: Denies any new nodularity, masses, tenderness, nipple changes, or nipple discharge.       PAST MEDICAL/SURGICAL HISTORY:  Past Medical History:  Diagnosis Date  . Anxiety   . Asthma   . Breast cancer ([place]) 2012   left  . Breast cancer, right ([place]) 1996   right  . Depressive disorder   . DVT (deep venous thrombosis) ([place])   . Glaucoma   . Hypertension   . Olecranon bursitis   . Osteoarthritis   . Other disorders of bone and cartilage(733.99)   . Seasonal allergies   . Unspecified pruritic disorder    Past Surgical History:  Procedure Laterality Date  . APPENDECTOMY  1958  . BREAST LUMPECTOMY  1996   Dr. Corena Herter in [place]  . BREAST LUMPECTOMY  2011   Dr. Pleas Patricia  . COLONOSCOPY  2011-last  . ROTATOR CUFF REPAIR  2007  . TONSILLECTOMY  1967     ALLERGIES:  Allergies  Allergen Reactions  . Latex Shortness Of Breath     CURRENT MEDICATIONS:  Outpatient Encounter Medications as of 12/04/2017  Medication Sig Note  .  Albuterol Sulfate (PROAIR RESPICLICK) 841 (90 BASE) MCG/ACT AEPB Inhale 2 puffs into the lungs every 4 (four) hours as needed (SOB).   . budesonide (PULMICORT) 180 MCG/ACT inhaler Inhale 2 puffs into the lungs 2 (two) times daily.   . budesonide (RHINOCORT AQUA) 32 MCG/ACT nasal spray Place 1 spray into the nose as needed.   . clonazePAM (KLONOPIN) 0.5 MG tablet Take 0.5 mg by mouth at bedtime as needed.   . hydrochlorothiazide (HYDRODIURIL) 25 MG tablet TAKE 1 TABLET(25 MG) BY MOUTH DAILY   . montelukast (SINGULAIR) 10 MG tablet Take 1 tablet (10 mg total) at  bedtime by mouth.   . traZODone (DESYREL) 150 MG tablet Take 150 mg by mouth at bedtime.   Marland Kitchen venlafaxine XR (EFFEXOR-XR) 150 MG 24 hr capsule Take one tablet by mouth once daily 12/13/2014: Received from: External Pharmacy   No facility-administered encounter medications on file as of 12/04/2017.      ONCOLOGIC FAMILY HISTORY:  Family History  Problem Relation Age of Onset  . Stroke Father 6  . Heart disease Brother   . Colon cancer Maternal Aunt 70    GENETIC COUNSELING/TESTING: Not indicated  SOCIAL HISTORY:  Jo Morrison is married and lives with her husband and son in [place], [place].  Jo Morrison is currently retired..  She denies any current or history of tobacco, alcohol, or illicit drug use.     PHYSICAL EXAMINATION:  Vital Signs: Vitals:   12/04/17 1231  BP: (!) 167/81  Pulse: 81  Resp: 20  Temp: 98.3 F (36.8 C)  SpO2: 100%   Filed Weights   12/04/17 1231  Weight: 125 lb 12.8 oz (57.1 kg)   General: Well-nourished, well-appearing female in no acute distress.  Unaccompanied today.   HEENT: Head is normocephalic.  Pupils equal and reactive to light. Conjunctivae clear without exudate.  Sclerae anicteric. Oral mucosa is pink, moist.  Oropharynx is pink without lesions or erythema.  Lymph: No cervical, supraclavicular, or infraclavicular lymphadenopathy noted on palpation.  Cardiovascular: Regular rate and rhythm.Marland Kitchen Respiratory: Clear to auscultation bilaterally. Chest expansion symmetric; breathing non-labored.  Breast Exam:  -Left breast: No appreciable masses on palpation. No skin redness, thickening, or peau d'orange appearance; no nipple retraction or nipple discharge; mild distortion in symmetry at previous lumpectomy site well healed scar without erythema or nodularity.  -Right breast: No appreciable masses on palpation. No skin redness, thickening, or peau d'orange appearance; no nipple retraction or nipple discharge; mild distortion in symmetry at  previous lumpectomy site well healed scar without erythema or nodularity. -Axilla: No axillary adenopathy bilaterally.  GI: Abdomen soft and round; non-tender, non-distended. Bowel sounds normoactive. No hepatosplenomegaly.   GU: Deferred.  Neuro: No focal deficits. Steady gait.  Psych: Mood and affect normal and appropriate for situation.  MSK: No focal spinal tenderness to palpation, full range of motion in bilateral upper extremities Extremities: No edema. Skin: Warm and dry.  LABORATORY DATA:  None for this visit   DIAGNOSTIC IMAGING:  Most recent mammogram:      ASSESSMENT AND PLAN:  Ms.. Morrison is a pleasant 72 y.o. female with history of  Right breast Stage IA invasive ductal carcinoma ER+/PR+/HER-2-, diagnosed in 05/1995 treated with lumpectomy, chemo, adjuvant radiation, and Tamoxifen x 5 years completing in 08/2000, followed by recurrence of left breast DCIS in 11/2009 treated with lumpectomy, adjuvant radiation, and Tamoxifen x 5 years completing therapy in 10/2015.  [facility] for [facility] and routine follow-up.  1. History of breast cancer:  Jo Morrison is currently clinically and radiographically without evidence of disease or recurrence of breast cancer. She is due for mammogram now; orders placed today.  She will see [place] back in a year for surveillance.  I encouraged her to call me with any questions or concerns before her next visit at the cancer center, and I would be happy to see her sooner, if needed.    2. Bone health:  Given Jo Morrison's age, history of breast cancer, she is at risk for bone demineralization. H  She was given education on specific food and activities to promote bone health.  3. Cancer screening:  Due to Jo Morrison's history and her age, she should receive screening for skin cancers, colon cancer, and gynecologic cancers. She was encouraged to follow-up with her PCP for appropriate cancer screenings.   4. Health maintenance  and wellness promotion: Jo Morrison was encouraged to consume 5-7 servings of fruits and vegetables per day. She was also encouraged to engage in moderate to vigorous exercise for 30 minutes per day most days of the week. She was instructed to limit her alcohol consumption and continue to abstain from tobacco use.    Dispo:  -Return to cancer center in one year for LTS follow up -Mammogram due next available   A total of (30) minutes of face-to-face time was spent with this patient with greater than 50% of that time in counseling and care-coordination.   Gardenia Phlegm, [place] [phone]   Note: PRIMARY CARE PROVIDER Hoyt Koch, [place] [phone]

## 2017-12-05 ENCOUNTER — Encounter: Payer: Self-pay | Admitting: Internal Medicine

## 2017-12-05 NOTE — Assessment & Plan Note (Addendum)
Taking hctz 25 mg daily and will have her increase exercise again as most readings at home are within goal and weekly average is at goal. Previously when she was more active all readings were in goal. Reminded about dash diet.

## 2017-12-18 ENCOUNTER — Encounter: Payer: Self-pay | Admitting: Internal Medicine

## 2017-12-24 ENCOUNTER — Encounter: Payer: Self-pay | Admitting: Internal Medicine

## 2017-12-31 ENCOUNTER — Ambulatory Visit
Admission: RE | Admit: 2017-12-31 | Discharge: 2017-12-31 | Disposition: A | Discharge status: Home / Self Care | Payer: PPO | Source: Ambulatory Visit | Attending: Adult Health | Admitting: Adult Health

## 2017-12-31 DIAGNOSIS — Z170 Estrogen receptor positive status [ER+]: Principal | ICD-10-CM

## 2017-12-31 DIAGNOSIS — C50511 Malignant neoplasm of lower-outer quadrant of right female breast: Secondary | ICD-10-CM

## 2017-12-31 DIAGNOSIS — R928 Other abnormal and inconclusive findings on diagnostic imaging of breast: Secondary | ICD-10-CM | POA: Diagnosis not present

## 2018-01-14 DIAGNOSIS — H2589 Other age-related cataract: Secondary | ICD-10-CM | POA: Diagnosis not present

## 2018-01-14 DIAGNOSIS — H35363 Drusen (degenerative) of macula, bilateral: Secondary | ICD-10-CM | POA: Diagnosis not present

## 2018-01-14 DIAGNOSIS — H401431 Capsular glaucoma with pseudoexfoliation of lens, bilateral, mild stage: Secondary | ICD-10-CM | POA: Diagnosis not present

## 2018-01-14 DIAGNOSIS — H26491 Other secondary cataract, right eye: Secondary | ICD-10-CM | POA: Diagnosis not present

## 2018-01-20 DIAGNOSIS — R05 Cough: Secondary | ICD-10-CM | POA: Diagnosis not present

## 2018-01-27 ENCOUNTER — Encounter: Payer: Self-pay | Admitting: Internal Medicine

## 2018-02-02 DIAGNOSIS — F3341 Major depressive disorder, recurrent, in partial remission: Secondary | ICD-10-CM | POA: Diagnosis not present

## 2018-02-17 ENCOUNTER — Other Ambulatory Visit: Payer: Self-pay | Admitting: Internal Medicine

## 2018-02-26 ENCOUNTER — Ambulatory Visit (AMBULATORY_SURGERY_CENTER): Payer: Self-pay | Admitting: *Deleted

## 2018-02-26 ENCOUNTER — Other Ambulatory Visit: Payer: Self-pay

## 2018-02-26 VITALS — Ht 63.0 in | Wt 129.2 lb

## 2018-02-26 DIAGNOSIS — Z860100 Personal history of colon polyps, unspecified: Secondary | ICD-10-CM

## 2018-02-26 NOTE — Progress Notes (Signed)
No egg or soy allergy known to patient  No issues with past sedation with any surgeries  or procedures, no intubation problems  No diet pills per patient No home 02 use per patient  No blood thinners per patient  Pt denies issues with constipation  No A fib or A flutter  EMMI video sent to pt's e mail pt declined   

## 2018-02-27 ENCOUNTER — Encounter: Payer: Self-pay | Admitting: Internal Medicine

## 2018-03-12 ENCOUNTER — Encounter: Payer: Self-pay | Admitting: Internal Medicine

## 2018-03-12 ENCOUNTER — Other Ambulatory Visit: Payer: Self-pay

## 2018-03-12 ENCOUNTER — Ambulatory Visit (AMBULATORY_SURGERY_CENTER): Payer: PPO | Admitting: Internal Medicine

## 2018-03-12 VITALS — BP 145/55 | HR 82 | Temp 98.6°F | Resp 16 | Ht 63.0 in | Wt 129.2 lb

## 2018-03-12 DIAGNOSIS — D120 Benign neoplasm of cecum: Secondary | ICD-10-CM | POA: Diagnosis not present

## 2018-03-12 DIAGNOSIS — D124 Benign neoplasm of descending colon: Secondary | ICD-10-CM

## 2018-03-12 DIAGNOSIS — Z860100 Personal history of colon polyps, unspecified: Secondary | ICD-10-CM

## 2018-03-12 DIAGNOSIS — I10 Essential (primary) hypertension: Secondary | ICD-10-CM | POA: Diagnosis not present

## 2018-03-12 DIAGNOSIS — Z860101 Personal history of adenomatous and serrated colon polyps: Secondary | ICD-10-CM

## 2018-03-12 DIAGNOSIS — J45909 Unspecified asthma, uncomplicated: Secondary | ICD-10-CM | POA: Diagnosis not present

## 2018-03-12 MED ORDER — SODIUM CHLORIDE 0.9 % IV SOLN: 500.0000 mL | Freq: Once | INTRAVENOUS | Status: DC

## 2018-03-12 NOTE — Progress Notes (Signed)
Called to room to assist during endoscopic procedure.  Patient ID and intended procedure confirmed with present staff. Received instructions for my participation in the procedure from the performing physician.  

## 2018-03-12 NOTE — Patient Instructions (Addendum)
   I found and removed 3 polyps that look benign.  I will let you know pathology results and when or if to have another routine colonoscopy by mail and/or My Chart.  I appreciate the opportunity to care for you. Gatha Mayer, MD, [facility]  [place] Handout provided   YOU HAD AN ENDOSCOPIC PROCEDURE TODAY AT [facility]:   Refer to the procedure report that was given to you for any specific questions about what was found during the examination.  If the procedure report does not answer your questions, please call your gastroenterologist to clarify.  If you requested that your care partner not be given the details of your procedure findings, then the procedure report has been included in a sealed envelope for you to review at your convenience later.  YOU SHOULD EXPECT: Some feelings of bloating in the abdomen. Passage of more gas than usual.  Walking can help get rid of the air that was put into your GI tract during the procedure and reduce the bloating. If you had a lower endoscopy (such as a colonoscopy or flexible sigmoidoscopy) you may notice spotting of blood in your stool or on the toilet paper. If you underwent a bowel prep for your procedure, you may not have a normal bowel movement for a few days.  Please Note:  You might notice some irritation and congestion in your nose or some drainage.  This is from the oxygen used during your procedure.  There is no need for concern and it should clear up in a day or so.  SYMPTOMS TO REPORT IMMEDIATELY:   Following lower endoscopy (colonoscopy or flexible sigmoidoscopy):  Excessive amounts of blood in the stool  Significant tenderness or worsening of abdominal pains  Swelling of the abdomen that is new, acute  Fever of 100F or higher   For urgent or emergent issues, a gastroenterologist can be reached at any hour by calling [phone].   DIET:  We do recommend a small meal at first, but then you may proceed to your  regular diet.  Drink plenty of fluids but you should avoid alcoholic beverages for 24 hours.  ACTIVITY:  You should plan to take it easy for the rest of today and you should NOT DRIVE or use heavy machinery until tomorrow (because of the sedation medicines used during the test).    FOLLOW UP: Our staff will call the number listed on your records the next business day following your procedure to check on you and address any questions or concerns that you may have regarding the information given to you following your procedure. If we do not reach you, we will leave a message.  However, if you are feeling well and you are not experiencing any problems, there is no need to return our call.  We will assume that you have returned to your regular daily activities without incident.  If any biopsies were taken you will be contacted by phone or by letter within the next 1-3 weeks.  Please call us at [phone] if you have not heard about the biopsies in 3 weeks.    SIGNATURES/CONFIDENTIALITY: You and/or your care partner have signed paperwork which will be entered into your electronic medical record.  These signatures attest to the fact that that the information above on your After Visit Summary has been reviewed and is understood.  Full responsibility of the confidentiality of this discharge information lies with you and/or your care-partner.

## 2018-03-12 NOTE — Op Note (Signed)
[facility] Patient Name: Jo Morrison Procedure Date: 03/12/2018 11:26 AM MRN: [id number] Endoscopist: Gatha Mayer , MD Age: 72 Referring MD:  Date of Birth: 1946-02-12 Gender: Female Account #: [id number] Procedure:                Colonoscopy Indications:              Surveillance: Personal history of adenomatous                            polyps on last colonoscopy > 3 years ago Medicines:                Propofol per Anesthesia, Monitored Anesthesia Care Procedure:                Pre-Anesthesia Assessment:                           - Prior to the procedure, a History and Physical                            was performed, and patient medications and                            allergies were reviewed. The patient's tolerance of                            previous anesthesia was also reviewed. The risks                            and benefits of the procedure and the sedation                            options and risks were discussed with the patient.                            All questions were answered, and informed consent                            was obtained. Prior Anticoagulants: The patient has                            taken no previous anticoagulant or antiplatelet                            agents. ASA Grade Assessment: II - A patient with                            mild systemic disease. After reviewing the risks                            and benefits, the patient was deemed in                            satisfactory condition to undergo the procedure.  After obtaining informed consent, the colonoscope                            was passed under direct vision. Throughout the                            procedure, the patient's blood pressure, pulse, and                            oxygen saturations were monitored continuously. The                            Model PCF-[id number] scope was introduced   through the anus and advanced to the the cecum,                            identified by appendiceal orifice and ileocecal                            valve. The colonoscopy was performed without                            difficulty. The patient tolerated the procedure                            well. The quality of the bowel preparation was                            excellent. The ileocecal valve, appendiceal                            orifice, and rectum were photographed. The bowel                            preparation used was Miralax. Scope In: 11:37:26 AM Scope Out: 11:47:21 AM Scope Withdrawal Time: 0 hours 7 minutes 23 seconds  Total Procedure Duration: 0 hours 9 minutes 55 seconds  Findings:                 The perianal examination was normal.                           The digital rectal exam findings include decreased                            sphincter tone.                           A 6 mm polyp was found in the descending colon. The                            polyp was sessile. The polyp was removed with a                            cold snare. Resection and retrieval were  complete.                            Verification of patient identification for the                            specimen was done. Estimated blood loss was minimal.                           Two sessile polyps were found in the cecum. The                            polyps were 1 mm in size. These polyps were removed                            with a cold biopsy forceps. Resection and retrieval                            were complete. Verification of patient                            identification for the specimen was done. Estimated                            blood loss was minimal.                           A few diverticula were found in the sigmoid colon.                           The exam was otherwise without abnormality on                            direct and retroflexion views. Complications:             No immediate complications. Estimated Blood Loss:     Estimated blood loss was minimal. Impression:               - Decreased sphincter tone found on digital rectal                            exam.                           - One 6 mm polyp in the descending colon, removed                            with a cold snare. Resected and retrieved.                           - Two 1 mm polyps in the cecum, removed with a cold                            biopsy forceps. Resected and retrieved.                           -  Diverticulosis in the sigmoid colon.                           - The examination was otherwise normal on direct                            and retroflexion views.                           - Personal history of colonic polyps. Adenomas 2014                            and polyps before [facility]) Recommendation:           - Patient has a contact number available for                            emergencies. The signs and symptoms of potential                            delayed complications were discussed with the                            patient. Return to normal activities tomorrow.                            Written discharge instructions were provided to the                            patient.                           - Resume previous diet.                           - Continue present medications.                           - Repeat colonoscopy is recommended for                            surveillance. The colonoscopy date Morrison be                            determined after pathology results from today's                            exam become available for review. Gatha Mayer, MD 03/12/2018 11:57:59 AM This report has been signed electronically.

## 2018-03-12 NOTE — Progress Notes (Signed)
Report to PACU, RN, vss, BBS= Clear.  

## 2018-03-16 ENCOUNTER — Telehealth: Payer: Self-pay | Admitting: *Deleted

## 2018-03-16 NOTE — Telephone Encounter (Signed)
  Follow up Call-  Call back number 03/12/2018  Post procedure Call Back phone  # [phone]  Permission to leave phone message Yes  Some recent data might be hidden     Patient questions:  Do you have a fever, pain , or abdominal swelling? No. Pain Score  0 *  Have you tolerated food without any problems? Yes.    Have you been able to return to your normal activities? Yes.    Do you have any questions about your discharge instructions: Diet   No. Medications  No. Follow up visit  No.  Do you have questions or concerns about your Care? No.  Actions: * If pain score is 4 or above: No action needed, pain <4.

## 2018-03-18 DIAGNOSIS — H6123 Impacted cerumen, bilateral: Secondary | ICD-10-CM | POA: Diagnosis not present

## 2018-03-18 DIAGNOSIS — R05 Cough: Secondary | ICD-10-CM | POA: Diagnosis not present

## 2018-03-22 ENCOUNTER — Encounter: Payer: Self-pay | Admitting: Internal Medicine

## 2018-03-22 NOTE — Progress Notes (Signed)
1 adenoma and 2 mucosal polyps Recall 2024 My Chart letter

## 2018-07-14 DIAGNOSIS — H401431 Capsular glaucoma with pseudoexfoliation of lens, bilateral, mild stage: Secondary | ICD-10-CM | POA: Diagnosis not present

## 2018-07-14 DIAGNOSIS — H2589 Other age-related cataract: Secondary | ICD-10-CM | POA: Diagnosis not present

## 2018-07-20 DIAGNOSIS — J3081 Allergic rhinitis due to animal (cat) (dog) hair and dander: Secondary | ICD-10-CM | POA: Diagnosis not present

## 2018-07-20 DIAGNOSIS — R05 Cough: Secondary | ICD-10-CM | POA: Diagnosis not present

## 2018-07-20 DIAGNOSIS — J3089 Other allergic rhinitis: Secondary | ICD-10-CM | POA: Diagnosis not present

## 2018-09-07 ENCOUNTER — Ambulatory Visit (INDEPENDENT_AMBULATORY_CARE_PROVIDER_SITE_OTHER): Payer: PPO | Admitting: Internal Medicine

## 2018-09-07 ENCOUNTER — Other Ambulatory Visit (INDEPENDENT_AMBULATORY_CARE_PROVIDER_SITE_OTHER): Payer: PPO

## 2018-09-07 VITALS — BP 134/70 | HR 88 | Temp 98.5°F | Ht 63.0 in | Wt 126.0 lb

## 2018-09-07 DIAGNOSIS — Z23 Encounter for immunization: Secondary | ICD-10-CM

## 2018-09-07 DIAGNOSIS — I10 Essential (primary) hypertension: Secondary | ICD-10-CM

## 2018-09-07 DIAGNOSIS — J4520 Mild intermittent asthma, uncomplicated: Secondary | ICD-10-CM

## 2018-09-07 DIAGNOSIS — F329 Major depressive disorder, single episode, unspecified: Secondary | ICD-10-CM

## 2018-09-07 DIAGNOSIS — F32A Depression, unspecified: Secondary | ICD-10-CM

## 2018-09-07 LAB — COMPREHENSIVE METABOLIC PANEL
ALT: 37 U/L — ABNORMAL HIGH (ref 0–35)
AST: 26 U/L (ref 0–37)
Albumin: 4.2 g/dL (ref 3.5–5.2)
Alkaline Phosphatase: 72 U/L (ref 39–117)
BUN: 11 mg/dL (ref 6–23)
CO2: 35 mEq/L — ABNORMAL HIGH (ref 19–32)
Calcium: 9.2 mg/dL (ref 8.4–10.5)
Chloride: 99 mEq/L (ref 96–112)
Creatinine, Ser: 0.71 mg/dL (ref 0.40–1.20)
GFR: 85.86 mL/min (ref >60.00)
Glucose, Bld: 96 mg/dL (ref 70–99)
Potassium: 3.4 mEq/L — ABNORMAL LOW (ref 3.5–5.1)
Sodium: 141 mEq/L (ref 135–145)
Total Bilirubin: 0.6 mg/dL (ref 0.2–1.2)
Total Protein: 7.2 g/dL (ref 6.0–8.3)

## 2018-09-07 LAB — LIPID PANEL
Cholesterol: 181 mg/dL (ref 0–200)
HDL: 71.7 mg/dL (ref >39.00)
LDL Cholesterol: 91 mg/dL (ref 0–99)
NonHDL: 109.32
Total CHOL/HDL Ratio: 3
Triglycerides: 90 mg/dL (ref 0.0–149.0)
VLDL: 18 mg/dL (ref 0.0–40.0)

## 2018-09-07 LAB — CBC
HCT: 42.9 % (ref 36.0–46.0)
Hemoglobin: 14.3 g/dL (ref 12.0–15.0)
MCHC: 33.4 g/dL (ref 30.0–36.0)
MCV: 92.8 fl (ref 78.0–100.0)
Platelets: 275 10*3/uL (ref 150.0–400.0)
RBC: 4.62 Mil/uL (ref 3.87–5.11)
RDW: 13.1 % (ref 11.5–15.5)
WBC: 10.6 10*3/uL — ABNORMAL HIGH (ref 4.0–10.5)

## 2018-09-07 NOTE — Progress Notes (Signed)
   Subjective:    Patient ID: Jo Morrison, female    DOB: 1945-12-24, 72 y.o.   MRN: [id number]  HPI The patient is a 72 YO female coming in for follow up of blood pressure (taking hctz and BP at goal, denies side effects, denies chest pains or headaches), asthma (doing well, taking allergy medication now as this is her hard time of year, no flare currently, has pulmicort if needed and albuterol, takes singulair and flonase) and her mood (still taking venlafaxine and doing well, denies SI/HI, denies flare, would like to continue since stable).   Review of Systems  Constitutional: Negative.   HENT: Negative.   Eyes: Negative.   Respiratory: Negative for cough, chest tightness and shortness of breath.   Cardiovascular: Negative for chest pain, palpitations and leg swelling.  Gastrointestinal: Negative for abdominal distention, abdominal pain, constipation, diarrhea, nausea and vomiting.  Musculoskeletal: Negative.   Skin: Negative.   Neurological: Negative.   Psychiatric/Behavioral: Negative.       Objective:   Physical Exam  Constitutional: She is oriented to person, place, and time. She appears well-developed and well-nourished.  HENT:  Head: Normocephalic and atraumatic.  Eyes: EOM are normal.  Neck: Normal range of motion.  Cardiovascular: Normal rate and regular rhythm.  Pulmonary/Chest: Effort normal and breath sounds normal. No respiratory distress. She has no wheezes. She has no rales.  Abdominal: Soft. Bowel sounds are normal. She exhibits no distension. There is no tenderness. There is no rebound.  Musculoskeletal: She exhibits no edema.  Neurological: She is alert and oriented to person, place, and time. Coordination normal.  Skin: Skin is warm and dry.  Psychiatric: She has a normal mood and affect.   Vitals:   09/07/18 0940  BP: 134/70  Pulse: 88  Temp: 98.5 F (36.9 C)  TempSrc: Oral  SpO2: 96%  Weight: 126 lb (57.2 kg)  Height: 5\' 3"  (1.6 m)      Assessment &  Plan:  Flu shot given at visit

## 2018-09-09 ENCOUNTER — Encounter: Payer: Self-pay | Admitting: Internal Medicine

## 2018-09-09 NOTE — Assessment & Plan Note (Signed)
BP at goal on hctz daily, checking CMP and adjust as needed.

## 2018-09-09 NOTE — Assessment & Plan Note (Signed)
Taking effexor and doing well, wants to continue currently and good control.

## 2018-09-09 NOTE — Assessment & Plan Note (Signed)
No flare currently, pulmicort and albuterol prn and singulair and flonase daily. Well controlled.

## 2018-09-15 ENCOUNTER — Other Ambulatory Visit: Payer: Self-pay | Admitting: Internal Medicine

## 2018-10-01 DIAGNOSIS — M720 Palmar fascial fibromatosis [Dupuytren]: Secondary | ICD-10-CM | POA: Diagnosis not present

## 2018-10-13 ENCOUNTER — Ambulatory Visit: Payer: PPO

## 2018-10-13 NOTE — Progress Notes (Deleted)
Subjective:   Jo Morrison is a 72 y.o. female who presents for Medicare Annual (Subsequent) preventive examination.  Review of Systems:  No ROS.  Medicare Wellness Visit. Additional risk factors are reflected in the social history.    Sleep patterns: {SX; SLEEP PATTERNS:18802::"feels rested on waking","does not get up to void","gets up *** times nightly to void","sleeps *** hours nightly"}.    Home Safety/Smoke Alarms: Feels safe in home. Smoke alarms in place.  Living environment; residence and Firearm Safety: {Rehab home environment / accessibility:30080::"no firearms","firearms stored safely"}. Seat Belt Safety/Bike Helmet: Wears seat belt.     Objective:     Vitals: There were no vitals taken for this visit.  There is no height or weight on file to calculate BMI.  Advanced Directives 10/08/2017 11/05/2016 11/06/2015 10/28/2014  Does Patient Have a Medical Advance Directive? Yes No No Yes  Type of Paramedic of [place];Living will - - -  Copy of [place] in Chart? No - copy requested - - No - copy requested    Tobacco Social History   Tobacco Use  Smoking Status Never Smoker  Smokeless Tobacco Never Used     Counseling given: Not Answered  Past Medical History:  Diagnosis Date  . Allergy   . Anxiety   . Asthma   . Breast cancer ([place]) 2012   left  . Breast cancer, right ([place]) 1996   right  . Cataract    cataracts removed  . Depressive disorder   . DVT (deep venous thrombosis) ([place])   . Glaucoma    not on eye drops at this time.  . Hypertension   . Olecranon bursitis   . Osteoarthritis   . Other disorders of bone and cartilage(733.99)   . Seasonal allergies   . Unspecified pruritic disorder    Past Surgical History:  Procedure Laterality Date  . APPENDECTOMY  1958  . BREAST LUMPECTOMY Right 1996   Dr. Corena Herter in [place]  . BREAST LUMPECTOMY Left 2011   Dr. Pleas Patricia  . cataract Bilateral   .  COLONOSCOPY  2011-last  . DUPUYTREN CONTRACTURE RELEASE    . POLYPECTOMY    . ROTATOR CUFF REPAIR  2007  . TONSILLECTOMY  1967   Family History  Problem Relation Age of Onset  . Stroke Father 9  . Heart disease Brother   . Colon cancer Maternal Aunt 54  . Colon polyps Neg Hx   . Esophageal cancer Neg Hx   . Rectal cancer Neg Hx   . Stomach cancer Neg Hx    Social History   Socioeconomic History  . Marital status: Married    Spouse name: Not on file  . Number of children: 0  . Years of education: Not on file  . Highest education level: Not on file  Occupational History  . Not on file  Social Needs  . Financial resource strain: Not hard at all  . Food insecurity:    Worry: Never true    Inability: Never true  . Transportation needs:    Medical: No    Non-medical: No  Tobacco Use  . Smoking status: Never Smoker  . Smokeless tobacco: Never Used  Substance and Sexual Activity  . Alcohol use: Yes    Alcohol/week: 10.0 standard drinks    Types: 10 Glasses of Sama Arauz per week    Comment: 1-2 glasses Valdemar Mcclenahan daily   . Drug use: No  . Sexual activity: Yes  Lifestyle  .  Physical activity:    Days per week: Not on file    Minutes per session: Not on file  . Stress: Not at all  Relationships  . Social connections:    Talks on phone: More than three times a week    Gets together: More than three times a week    Attends religious service: Not on file    Active member of club or organization: Yes    Attends meetings of clubs or organizations: More than 4 times per year    Relationship status: Married  Other Topics Concern  . Not on file  Social History Narrative   ** Merged History Encounter **       Two cups of coffee daily    Outpatient Encounter Medications as of 10/13/2018  Medication Sig  . Albuterol Sulfate (PROAIR RESPICLICK) 175 (90 BASE) MCG/ACT AEPB Inhale 2 puffs into the lungs every 4 (four) hours as needed (SOB).  . budesonide (PULMICORT) 180 MCG/ACT  inhaler Inhale 2 puffs into the lungs 2 (two) times daily.  . clonazePAM (KLONOPIN) 0.5 MG tablet Take 0.5 mg by mouth at bedtime as needed.  . fluticasone (FLONASE) 50 MCG/ACT nasal spray Place into the nose.  . gabapentin (NEURONTIN) 300 MG capsule 300 mg 3 (three) times daily.   . hydrochlorothiazide (HYDRODIURIL) 25 MG tablet TAKE 1 TABLET(25 MG) BY MOUTH DAILY  . montelukast (SINGULAIR) 10 MG tablet TAKE 1 TABLET(10 MG) BY MOUTH AT BEDTIME  . traZODone (DESYREL) 150 MG tablet Take 150 mg by mouth at bedtime.  Marland Kitchen venlafaxine XR (EFFEXOR-XR) 150 MG 24 hr capsule Take one tablet by mouth once daily   No facility-administered encounter medications on file as of 10/13/2018.     Activities of Daily Living No flowsheet data found.  Patient Care Team: Hoyt Koch, MD as PCP - General (Internal Medicine) Nicholas Lose, MD as Consulting Physician (Hematology and Oncology) Gatha Mayer, MD as Consulting Physician (Gastroenterology)    Assessment:   This is a routine wellness examination for Jo Morrison. Physical assessment deferred to PCP.   Exercise Activities and Dietary recommendations   Diet (meal preparation, eat out, water intake, caffeinated beverages, dairy products, fruits and vegetables): {Desc; diets:16563}   Goals   None     Fall Risk Fall Risk  10/08/2017 07/08/2017 02/07/2016 10/28/2014 06/14/2014  Falls in the past year? No No Yes No No  Number falls in past yr: - - 1 - -  Injury with Fall? - - No - -    Depression Screen PHQ 2/9 Scores 10/08/2017 07/08/2017 02/07/2016  PHQ - 2 Score 0 0 0  PHQ- 9 Score 0 - -     Cognitive Function        Immunization History  Administered Date(s) Administered  . Influenza, High Dose Seasonal PF 08/09/2016, 09/09/2017, 09/07/2018  . Influenza,inj,Quad PF,6+ Mos 08/10/2015  . Influenza-Unspecified 08/25/2010, 09/15/2012  . Pneumococcal Conjugate-13 12/13/2014  . Pneumococcal Polysaccharide-23 06/16/2013  . Tdap  11/25/1998, 08/10/2015  . Zoster 05/27/2012  . Zoster Recombinat (Shingrix) 06/22/2017, 09/10/2017   Screening Tests Health Maintenance  Topic Date Due  . Hepatitis C Screening  08/16/2026 (Originally 12-Aug-1946)  . MAMMOGRAM  01/01/2020  . COLONOSCOPY  03/13/2023  . TETANUS/TDAP  08/09/2025  . INFLUENZA VACCINE  Completed  . DEXA SCAN  Completed  . PNA vac Low Risk Adult  Completed      Plan:     I have personally reviewed and noted the following in the patient's  chart:   . Medical and social history . Use of alcohol, tobacco or illicit drugs  . Current medications and supplements . Functional ability and status . Nutritional status . Physical activity . Advanced directives . List of other physicians . Vitals . Screenings to include cognitive, depression, and falls . Referrals and appointments  In addition, I have reviewed and discussed with patient certain preventive protocols, quality metrics, and best practice recommendations. A written personalized care plan for preventive services as well as general preventive health recommendations were provided to patient.     Michiel Cowboy, RN  10/13/2018

## 2018-11-10 ENCOUNTER — Other Ambulatory Visit: Payer: Self-pay | Admitting: Adult Health

## 2018-11-10 DIAGNOSIS — Z1231 Encounter for screening mammogram for malignant neoplasm of breast: Secondary | ICD-10-CM

## 2018-11-10 DIAGNOSIS — R251 Tremor, unspecified: Secondary | ICD-10-CM | POA: Diagnosis not present

## 2018-11-10 DIAGNOSIS — R05 Cough: Secondary | ICD-10-CM | POA: Diagnosis not present

## 2018-11-11 ENCOUNTER — Other Ambulatory Visit: Payer: Self-pay

## 2018-11-11 DIAGNOSIS — M79642 Pain in left hand: Secondary | ICD-10-CM | POA: Diagnosis not present

## 2018-11-11 DIAGNOSIS — M720 Palmar fascial fibromatosis [Dupuytren]: Secondary | ICD-10-CM | POA: Diagnosis not present

## 2018-11-11 DIAGNOSIS — G8918 Other acute postprocedural pain: Secondary | ICD-10-CM | POA: Diagnosis not present

## 2018-11-12 DIAGNOSIS — M720 Palmar fascial fibromatosis [Dupuytren]: Secondary | ICD-10-CM | POA: Diagnosis not present

## 2018-11-14 ENCOUNTER — Other Ambulatory Visit: Payer: Self-pay | Admitting: Internal Medicine

## 2018-11-20 DIAGNOSIS — M720 Palmar fascial fibromatosis [Dupuytren]: Secondary | ICD-10-CM | POA: Diagnosis not present

## 2018-11-25 HISTORY — PX: HAND SURGERY: SHX662

## 2018-11-26 ENCOUNTER — Telehealth: Payer: Self-pay | Admitting: Adult Health

## 2018-11-26 NOTE — Telephone Encounter (Signed)
Called patient to confirm appt that was changed to 1/24.

## 2018-11-27 DIAGNOSIS — M720 Palmar fascial fibromatosis [Dupuytren]: Secondary | ICD-10-CM | POA: Diagnosis not present

## 2018-12-03 DIAGNOSIS — M720 Palmar fascial fibromatosis [Dupuytren]: Secondary | ICD-10-CM | POA: Diagnosis not present

## 2018-12-04 ENCOUNTER — Encounter: Payer: PPO | Admitting: Adult Health

## 2018-12-09 DIAGNOSIS — D225 Melanocytic nevi of trunk: Secondary | ICD-10-CM | POA: Diagnosis not present

## 2018-12-09 DIAGNOSIS — L819 Disorder of pigmentation, unspecified: Secondary | ICD-10-CM | POA: Diagnosis not present

## 2018-12-09 DIAGNOSIS — D1801 Hemangioma of skin and subcutaneous tissue: Secondary | ICD-10-CM | POA: Diagnosis not present

## 2018-12-09 DIAGNOSIS — L710 Perioral dermatitis: Secondary | ICD-10-CM | POA: Diagnosis not present

## 2018-12-09 DIAGNOSIS — L821 Other seborrheic keratosis: Secondary | ICD-10-CM | POA: Diagnosis not present

## 2018-12-10 DIAGNOSIS — M25642 Stiffness of left hand, not elsewhere classified: Secondary | ICD-10-CM | POA: Diagnosis not present

## 2018-12-14 ENCOUNTER — Other Ambulatory Visit: Payer: Self-pay | Admitting: Internal Medicine

## 2018-12-16 ENCOUNTER — Encounter: Payer: Self-pay | Admitting: Adult Health

## 2018-12-18 ENCOUNTER — Telehealth: Payer: Self-pay | Admitting: Adult Health

## 2018-12-18 ENCOUNTER — Inpatient Hospital Stay: Payer: PPO | Attending: Adult Health | Admitting: Adult Health

## 2018-12-18 ENCOUNTER — Encounter: Payer: Self-pay | Admitting: Adult Health

## 2018-12-18 VITALS — BP 160/75 | HR 85 | Temp 98.5°F | Resp 17 | Ht 63.0 in | Wt 125.0 lb

## 2018-12-18 DIAGNOSIS — Z923 Personal history of irradiation: Secondary | ICD-10-CM | POA: Diagnosis not present

## 2018-12-18 DIAGNOSIS — Z86718 Personal history of other venous thrombosis and embolism: Secondary | ICD-10-CM

## 2018-12-18 DIAGNOSIS — Z853 Personal history of malignant neoplasm of breast: Secondary | ICD-10-CM | POA: Diagnosis not present

## 2018-12-18 DIAGNOSIS — C50511 Malignant neoplasm of lower-outer quadrant of right female breast: Secondary | ICD-10-CM

## 2018-12-18 DIAGNOSIS — Z9221 Personal history of antineoplastic chemotherapy: Secondary | ICD-10-CM | POA: Diagnosis not present

## 2018-12-18 DIAGNOSIS — M25642 Stiffness of left hand, not elsewhere classified: Secondary | ICD-10-CM | POA: Diagnosis not present

## 2018-12-18 DIAGNOSIS — Z170 Estrogen receptor positive status [ER+]: Secondary | ICD-10-CM

## 2018-12-18 NOTE — Progress Notes (Signed)
CLINIC:  Survivorship   REASON FOR VISIT:  Routine follow-up for history of breast cancer.   BRIEF ONCOLOGIC HISTORY:    Breast cancer of lower-outer quadrant of right female breast ([place])   05/29/1995 Surgery    The right breast invasive ductal carcinoma [name] [name] status post right lumpectomy with axillary lymph node dissection and chemotherapy with 4 cycles of AC, adjuvant radiation and 5 years tamoxifen    08/07/1995 - 10/07/1995 Chemotherapy    Cytoxan based chemo X 3 months    11/06/1995 - 12/19/1995 Radiation Therapy    Adjuvant XRT    07/06/1996 - 09/05/2000 Anti-estrogen oral therapy    Tamoxifen 20 mg daily x 5 years    12/13/2009 Relapse/Recurrence    Left breast lumpectomy revealed DCIS solid and cribriform types with comedonecrosis 1.1 cm [name], [name]     01/22/2010 - 02/26/2010 Radiation Therapy    Adjuvant XRT    04/05/2010 - 11/06/2015 Anti-estrogen oral therapy    Tamoxifen 20 mg daily      INTERVAL HISTORY:  Ms. Pelle presents to the [facility] today for routine follow-up for her history of breast cancer.  Overall, she reports feeling quite well.  Sharrie Rothman has had a good year.  She notes some weight gain, but states that she got out of the habit of exercise.  She plans on improving this when the weather is good.  She still sees Primary Care annually.  She just had skin cancer screening at [facility].  She is up to date with colon cancer screening.  Cari had hand surgery mid December.     REVIEW OF SYSTEMS:  Review of Systems  Constitutional: Negative for appetite change, chills, fatigue, fever and unexpected weight change.  HENT:   Negative for hearing loss, lump/mass, sore throat, tinnitus, trouble swallowing and voice change.   Eyes: Negative for eye problems and icterus.  Respiratory: Negative for chest tightness, cough and shortness of breath.   Cardiovascular: Negative for chest pain, leg swelling and palpitations.    Gastrointestinal: Negative for abdominal distention and abdominal pain.  Endocrine: Negative for hot flashes.  Genitourinary: Negative for difficulty urinating.   Musculoskeletal: Negative for arthralgias.  Skin: Negative for itching and rash.  Neurological: Negative for dizziness, extremity weakness, headaches and numbness.  Hematological: Negative for adenopathy. Does not bruise/bleed easily.  Psychiatric/Behavioral: Negative for depression. The patient is not nervous/anxious.   Breast: Denies any new nodularity, masses, tenderness, nipple changes, or nipple discharge.       PAST MEDICAL/SURGICAL HISTORY:  Past Medical History:  Diagnosis Date  . Allergy   . Anxiety   . Asthma   . Breast cancer ([place]) 2012   left  . Breast cancer, right ([place]) 1996   right  . Cataract    cataracts removed  . Depressive disorder   . DVT (deep venous thrombosis) ([place])   . Glaucoma    not on eye drops at this time.  . Hypertension   . Olecranon bursitis   . Osteoarthritis   . Other disorders of bone and cartilage(733.99)   . Seasonal allergies   . Unspecified pruritic disorder    Past Surgical History:  Procedure Laterality Date  . APPENDECTOMY  1958  . BREAST LUMPECTOMY Right 1996   Dr. Corena Herter in [place]  . BREAST LUMPECTOMY Left 2011   Dr. Pleas Patricia  . cataract Bilateral   . COLONOSCOPY  2011-last  . DUPUYTREN CONTRACTURE RELEASE    .  POLYPECTOMY    . ROTATOR CUFF REPAIR  2007  . TONSILLECTOMY  1967     ALLERGIES:  Allergies  Allergen Reactions  . Latex Shortness Of Breath     CURRENT MEDICATIONS:  Outpatient Encounter Medications as of 12/18/2018  Medication Sig Note  . Albuterol Sulfate (PROAIR RESPICLICK) 790 (90 BASE) MCG/ACT AEPB Inhale 2 puffs into the lungs every 4 (four) hours as needed (SOB).   . budesonide (PULMICORT) 180 MCG/ACT inhaler Inhale 2 puffs into the lungs 2 (two) times daily.   . clonazePAM (KLONOPIN) 0.5 MG tablet Take 0.5 mg by mouth at  bedtime as needed.   . fluticasone (FLONASE) 50 MCG/ACT nasal spray Place into the nose.   . gabapentin (NEURONTIN) 100 MG capsule Take 100 mg by mouth 2 (two) times daily. Patient takes 100 mg in the AM and 200 mg at night   . hydrochlorothiazide (HYDRODIURIL) 25 MG tablet TAKE 1 TABLET(25 MG) BY MOUTH DAILY   . montelukast (SINGULAIR) 10 MG tablet TAKE 1 TABLET(10 MG) BY MOUTH AT BEDTIME   . traZODone (DESYREL) 150 MG tablet Take 150 mg by mouth at bedtime.   Marland Kitchen venlafaxine XR (EFFEXOR-XR) 150 MG 24 hr capsule Take one tablet by mouth once daily 12/13/2014: Received from: External Pharmacy   No facility-administered encounter medications on file as of 12/18/2018.      ONCOLOGIC FAMILY HISTORY:  Family History  Problem Relation Age of Onset  . Stroke Father 56  . Heart disease Brother   . Colon cancer Maternal Aunt 68  . Colon polyps Neg Hx   . Esophageal cancer Neg Hx   . Rectal cancer Neg Hx   . Stomach cancer Neg Hx     GENETIC COUNSELING/TESTING: Not indicated  SOCIAL HISTORY:  Carianna Lague is married and lives with her husband and son in [place].  Ms. Kasinger is currently retired..  She denies any current or history of tobacco, alcohol, or illicit drug use.  (reviewed 12/18/2018)   PHYSICAL EXAMINATION:  Vital Signs: Vitals:   12/18/18 1115  BP: (!) 160/75  Pulse: 85  Resp: 17  Temp: 98.5 F (36.9 C)  SpO2: 99%   Filed Weights   12/18/18 1115  Weight: 125 lb (56.7 kg)   General: Well-nourished, well-appearing female in no acute distress.  Unaccompanied today.   HEENT: Head is normocephalic.  Pupils equal and reactive to light. Conjunctivae clear without exudate.  Sclerae anicteric. Oral mucosa is pink, moist.  Oropharynx is pink without lesions or erythema.  Lymph: No cervical, supraclavicular, or infraclavicular lymphadenopathy noted on palpation.  Cardiovascular: Regular rate and rhythm.Marland Kitchen Respiratory: Clear to auscultation bilaterally. Chest  expansion symmetric; breathing non-labored.  Breast Exam:  -Left breast: No appreciable masses on palpation. No skin redness, thickening, or peau d'orange appearance; no nipple retraction or nipple discharge; mild distortion in symmetry at previous lumpectomy site well healed scar without erythema or nodularity.  -Right breast: No appreciable masses on palpation. No skin redness, thickening, or peau d'orange appearance; no nipple retraction or nipple discharge; mild distortion in symmetry at previous lumpectomy site well healed scar without erythema or nodularity. -Axilla: No axillary adenopathy bilaterally.  GI: Abdomen soft and round; non-tender, non-distended. Bowel sounds normoactive. No hepatosplenomegaly.   GU: Deferred.  Neuro: No focal deficits. Steady gait.  Psych: Mood and affect normal and appropriate for situation.  MSK: No focal spinal tenderness to palpation, full range of motion in bilateral upper extremities Extremities: No edema. Skin: Warm and dry.  LABORATORY DATA:  None for this visit   DIAGNOSTIC IMAGING:  Most recent mammogram:      ASSESSMENT AND PLAN:  Ms.. Schlotzhauer is a pleasant 73 y.o. female with history of  Right breast Stage IA invasive ductal carcinoma ER+/PR+/HER-2-, diagnosed in 05/1995 treated with lumpectomy, chemo, adjuvant radiation, and Tamoxifen x 5 years completing in 08/2000, followed by recurrence of left breast DCIS in 11/2009 treated with lumpectomy, adjuvant radiation, and Tamoxifen x 5 years completing therapy in 10/2015.  She [facility] for surveillance and routine follow-up.   1. History of breast cancer:  Ms. Imparato is currently clinically and radiographically without evidence of disease or recurrence of breast cancer. She has her annual mammogram scheduled for next week.  She will see [place] back in a year for surveillance--I requested one year and two months so that I can see her next year after her annual mammogram.  I  encouraged her to call me with any questions or concerns before her next visit at the cancer center, and I would be happy to see her sooner, if needed.    2. Bone health:  Given Ms. Beed's age, history of breast cancer, she is at risk for bone demineralization.  Since she is not on any medications that decrease bone density, she can follow routine DEXA screening recommendations, as per her primary care provider. She was given education on specific food and activities to promote bone health.  3. Cancer screening:  Due to Ms. Portela's history and her age, she should receive screening for skin cancers, colon cancer. She was encouraged to follow-up with her PCP for appropriate cancer screenings.   4. Health maintenance and wellness promotion: Ms. Sherpa was encouraged to consume 5-7 servings of fruits and vegetables per day. She was also encouraged to engage in moderate to vigorous exercise for 30 minutes per day most days of the week. She was instructed to limit her alcohol consumption and continue to abstain from tobacco use.    Dispo:  -Return to cancer center in one year and two months for LTS follow up -Mammogram in 12/2018  A total of (20) minutes of face-to-face time was spent with this patient with greater than 50% of that time in counseling and care-coordination.   Gardenia Phlegm, [place] [phone]   Note: PRIMARY CARE PROVIDER Hoyt Koch, [place] [phone]

## 2018-12-18 NOTE — Telephone Encounter (Signed)
Gave avs and calendar ° °

## 2018-12-22 DIAGNOSIS — M25642 Stiffness of left hand, not elsewhere classified: Secondary | ICD-10-CM | POA: Diagnosis not present

## 2019-01-05 ENCOUNTER — Ambulatory Visit
Admission: RE | Admit: 2019-01-05 | Discharge: 2019-01-05 | Disposition: A | Discharge status: Home / Self Care | Payer: PPO | Source: Ambulatory Visit | Attending: Adult Health | Admitting: Adult Health

## 2019-01-05 DIAGNOSIS — Z1231 Encounter for screening mammogram for malignant neoplasm of breast: Secondary | ICD-10-CM

## 2019-01-18 DIAGNOSIS — H35363 Drusen (degenerative) of macula, bilateral: Secondary | ICD-10-CM | POA: Diagnosis not present

## 2019-01-18 DIAGNOSIS — H40051 Ocular hypertension, right eye: Secondary | ICD-10-CM | POA: Diagnosis not present

## 2019-01-18 DIAGNOSIS — H2589 Other age-related cataract: Secondary | ICD-10-CM | POA: Diagnosis not present

## 2019-01-18 DIAGNOSIS — H401431 Capsular glaucoma with pseudoexfoliation of lens, bilateral, mild stage: Secondary | ICD-10-CM | POA: Diagnosis not present

## 2019-01-27 ENCOUNTER — Ambulatory Visit: Payer: PPO | Admitting: Physician Assistant

## 2019-01-27 ENCOUNTER — Encounter: Payer: Self-pay | Admitting: Physician Assistant

## 2019-01-27 DIAGNOSIS — F411 Generalized anxiety disorder: Secondary | ICD-10-CM

## 2019-01-27 DIAGNOSIS — G4700 Insomnia, unspecified: Secondary | ICD-10-CM | POA: Diagnosis not present

## 2019-01-27 DIAGNOSIS — F331 Major depressive disorder, recurrent, moderate: Secondary | ICD-10-CM | POA: Diagnosis not present

## 2019-01-27 MED ORDER — CLONAZEPAM 0.5 MG PO TABS: 0.5000 mg | ORAL_TABLET | Freq: Every evening | ORAL | 0 refills | Status: DC | PRN

## 2019-01-27 MED ORDER — VENLAFAXINE HCL ER 150 MG PO CP24: 150.0000 mg | ORAL_CAPSULE | Freq: Every day | ORAL | 3 refills | Status: DC

## 2019-01-27 MED ORDER — TRAZODONE HCL 100 MG PO TABS: 100.0000 mg | ORAL_TABLET | Freq: Every evening | ORAL | 3 refills | Status: DC | PRN

## 2019-01-27 NOTE — Progress Notes (Signed)
[facility] Med Check  Patient ID: Jo Morrison,  MRN: [id number]  PCP: Hoyt Koch, MD  Date of Evaluation: 01/27/2019 Time spent:15 minutes  Chief Complaint:   HISTORY/CURRENT STATUS: HPI here for routine annual med check.  Patient states she is doing really well. Patient denies loss of interest in usual activities and is able to enjoy things.  Denies decreased energy or motivation.  Appetite has not changed.  No extreme sadness, tearfulness, or feelings of hopelessness.  Denies any changes in concentration, making decisions or remembering things.  Denies suicidal or homicidal thoughts.  Rarely has anxiety and does not need the Klonopin often at all.  In fact she still has a bottle of medication from 2017 that she has not used.  The only problem is she is not sleeping well.  1-2 nights a week she does not get a good nights rest and does not feel refreshed when she wakes up the next morning.  That even with taking the trazodone 150 mg nightly.  Individual Medical History/ Review of Systems: Changes? :Yes  Dupuytrens contracture surgery left hand since LOV  Allergies: Latex  Current Medications:  Current Outpatient Medications:  .  Albuterol Sulfate (PROAIR RESPICLICK) 607 (90 BASE) MCG/ACT AEPB, Inhale 2 puffs into the lungs every 4 (four) hours as needed (SOB)., Disp: 1 each, Rfl: 0 .  budesonide (PULMICORT) 180 MCG/ACT inhaler, Inhale 2 puffs into the lungs 2 (two) times daily., Disp: , Rfl:  .  clonazePAM (KLONOPIN) 0.5 MG tablet, Take 1 tablet (0.5 mg total) by mouth at bedtime as needed., Disp: 30 tablet, Rfl: 0 .  fluticasone (FLONASE) 50 MCG/ACT nasal spray, Place into the nose., Disp: , Rfl:  .  gabapentin (NEURONTIN) 100 MG capsule, Take 100 mg by mouth 2 (two) times daily. Patient takes 100 mg in the AM and 200 mg at night, Disp: , Rfl: 3 .  hydrochlorothiazide (HYDRODIURIL) 25 MG tablet, TAKE 1 TABLET(25 MG) BY MOUTH DAILY, Disp: 90 tablet, Rfl: 2 .  montelukast  (SINGULAIR) 10 MG tablet, TAKE 1 TABLET(10 MG) BY MOUTH AT BEDTIME, Disp: 90 tablet, Rfl: 2 .  traZODone (DESYREL) 150 MG tablet, Take 150 mg by mouth at bedtime., Disp: , Rfl:  .  venlafaxine XR (EFFEXOR-XR) 150 MG 24 hr capsule, Take 1 capsule (150 mg total) by mouth daily. Take one tablet by mouth once daily, Disp: 90 capsule, Rfl: 3 .  traZODone (DESYREL) 100 MG tablet, Take 1-2 tablets (100-200 mg total) by mouth at bedtime as needed for sleep., Disp: 180 tablet, Rfl: 3 Medication Side Effects: none  Family Medical/ Social History: Changes? No except that her adult stepson moved into his own place last summer.  That has been nice for her and her husband to have their "home back."  MENTAL HEALTH EXAM:  There were no vitals taken for this visit.There is no height or weight on file to calculate BMI.  General Appearance: Casual and Well Groomed  Eye Contact:  Good  Speech:  Clear and Coherent  Volume:  Normal  Mood:  Euthymic  Affect:  Appropriate  Thought Process:  Goal Directed  Orientation:  Full (Time, Place, and Person)  Thought Content: Logical   Suicidal Thoughts:  No  Homicidal Thoughts:  No  Memory:  WNL  Judgement:  Good  Insight:  Good  Psychomotor Activity:  Normal  Concentration:  Concentration: Good and Attention Span: Good  Recall:  Good  Fund of Knowledge: Good  Language: Good  Assets:  Desire  for Improvement  ADL's:  Intact  Cognition: WNL  Prognosis:  Good    DIAGNOSES:    ICD-10-CM   1. Major depressive disorder, recurrent episode, moderate (HCC) F33.1   2. Generalized anxiety disorder F41.1   3. Insomnia, unspecified type G47.00     Receiving Psychotherapy: No    RECOMMENDATIONS: Increase trazodone from 150 mg p.o. nightly to 200 mg p.o. nightly as needed. Continue Effexor X are 150 mg p.o. every morning. New prescription given for Klonopin 0.5 mg half to 1 daily as needed. Return in 1 year or sooner as needed.  Donnal Moat, PA-C   This  record has been created using [employer].  Chart creation errors have been sought, but may not always have been located and corrected. Such creation errors do not reflect on the standard of medical care.

## 2019-02-08 ENCOUNTER — Other Ambulatory Visit: Payer: Self-pay | Admitting: Physician Assistant

## 2019-02-08 DIAGNOSIS — H40051 Ocular hypertension, right eye: Secondary | ICD-10-CM | POA: Diagnosis not present

## 2019-02-08 DIAGNOSIS — H401431 Capsular glaucoma with pseudoexfoliation of lens, bilateral, mild stage: Secondary | ICD-10-CM | POA: Diagnosis not present

## 2019-03-01 ENCOUNTER — Ambulatory Visit: Payer: Self-pay

## 2019-03-01 ENCOUNTER — Encounter: Payer: Self-pay | Admitting: Internal Medicine

## 2019-03-01 ENCOUNTER — Ambulatory Visit (INDEPENDENT_AMBULATORY_CARE_PROVIDER_SITE_OTHER): Payer: PPO | Admitting: Internal Medicine

## 2019-03-01 DIAGNOSIS — M545 Low back pain, unspecified: Secondary | ICD-10-CM | POA: Insufficient documentation

## 2019-03-01 MED ORDER — METHOCARBAMOL 500 MG PO TABS: 500.0000 mg | ORAL_TABLET | Freq: Three times a day (TID) | ORAL | 0 refills | Status: DC | PRN

## 2019-03-01 NOTE — Progress Notes (Signed)
Virtual Visit via Video Note  I connected with Jo Morrison on 03/01/19 at  1:00 PM EDT by a video enabled telemedicine application and verified that I am speaking with the correct person using two identifiers.   I discussed the limitations of evaluation and management by telemedicine and the availability of in person appointments. The patient expressed understanding and agreed to proceed.  History of Present Illness: The patient is a 73 y.o.  female with visit for right sided low back pain. Started this morning and was 9/10 pain. She took aleve and the pain is still 6/10. She denies any falls or injuries recently. Has been doing some gardening but does not consider this outside usual. Denies numbness or tingling. The pain is right lower back and does not radiate anywhere. No urinary symptoms. No nausea or vomiting. Eating and drinking normally. No diarrhea or constipation. Overall it is stable. Has tried aleve  Observations/Objective: Appearance: normal, breathing appears normal, normal grooming, abdomen does not appear distended, pain location appears to be low right paraspinal/SI, throat not examined, mental status is A and O times 3  Assessment and Plan: See problem oriented charting  Follow Up Instructions: rx for robaxin and given list of symptoms to contact us back about  I discussed the assessment and treatment plan with the patient. The patient was provided an opportunity to ask questions and all were answered. The patient agreed with the plan and demonstrated an understanding of the instructions.   The patient was advised to call back or seek an in-person evaluation if the symptoms worsen or if the condition fails to improve as anticipated.  Hoyt Koch, MD

## 2019-03-01 NOTE — Telephone Encounter (Signed)
Notified that pt has virtual visit scheduled 03/01/2019 at 1300 with Dr Pricilla Holm.

## 2019-03-01 NOTE — Assessment & Plan Note (Signed)
Suspect muscular or SI inflammation. She has been doing more gardening lately which could be related. No red flags to suggest emergent need for labs or imaging. Advised to avoid straining and lifting. Given symptoms to monitor for and reasons to contact us back or seek care.

## 2019-03-01 NOTE — Telephone Encounter (Signed)
Attempted to contact pt regarding symptoms; left message on voicemail [phone]' also see pt my chart message dated 02/28/2019 at 1120; will route to office for final disposition.

## 2019-03-02 ENCOUNTER — Telehealth: Payer: Self-pay

## 2019-03-02 NOTE — Telephone Encounter (Signed)
PA started on CoverMyMeds KEY: [id number]  PA approved through till 02/29/2020

## 2019-03-22 DIAGNOSIS — H401431 Capsular glaucoma with pseudoexfoliation of lens, bilateral, mild stage: Secondary | ICD-10-CM | POA: Diagnosis not present

## 2019-05-10 DIAGNOSIS — H401431 Capsular glaucoma with pseudoexfoliation of lens, bilateral, mild stage: Secondary | ICD-10-CM | POA: Diagnosis not present

## 2019-05-10 DIAGNOSIS — H2000 Unspecified acute and subacute iridocyclitis: Secondary | ICD-10-CM | POA: Diagnosis not present

## 2019-05-17 ENCOUNTER — Encounter: Payer: Self-pay | Admitting: Internal Medicine

## 2019-05-17 ENCOUNTER — Ambulatory Visit (INDEPENDENT_AMBULATORY_CARE_PROVIDER_SITE_OTHER): Payer: PPO | Admitting: Internal Medicine

## 2019-05-17 DIAGNOSIS — R21 Rash and other nonspecific skin eruption: Secondary | ICD-10-CM | POA: Diagnosis not present

## 2019-05-17 MED ORDER — TRIAMCINOLONE ACETONIDE 0.1 % EX CREA: 1.0000 "application " | TOPICAL_CREAM | Freq: Two times a day (BID) | CUTANEOUS | 0 refills | Status: DC

## 2019-05-17 NOTE — Progress Notes (Signed)
Virtual Visit via Video Note  I connected with Jo Morrison on 05/17/19 at  2:00 PM EDT by a video enabled telemedicine application and verified that I am speaking with the correct person using two identifiers.  The patient and the provider were at separate locations throughout the entire encounter.   I discussed the limitations of evaluation and management by telemedicine and the availability of in person appointments. The patient expressed understanding and agreed to proceed.  History of Present Illness: The patient is a 73 y.o. female with visit for rash. Started on her face and hand and chest around 2 weeks ago. Thought she may have had poison ivy/oak as she was picking the lavender. Now she is having spots on her leg and stomach region. Those started about 2 days ago and she was outdoors but no exposures that she knows of. The ones on face and chest and fingers itch. Has been improving over the last week or two. Using neosporin on the rash on chest. Denies fevers or chills or cough or SOB. Overall it is improving on chest and face but stable on stomach and thighs. Has tried nothing on them.   Observations/Objective: Appearance: normal, breathing appears normal, casual grooming, abdomen with red rash on the RLQ appear distended, lacy rash on the cheeks, rash on the chest wall without bumps or blistering, throat normal, mental status is A and O times 3  Assessment and Plan: See problem oriented charting  Follow Up Instructions: rx triamcinolone  I discussed the assessment and treatment plan with the patient. The patient was provided an opportunity to ask questions and all were answered. The patient agreed with the plan and demonstrated an understanding of the instructions.   The patient was advised to call back or seek an in-person evaluation if the symptoms worsen or if the condition fails to improve as anticipated.  Hoyt Koch, MD

## 2019-05-17 NOTE — Assessment & Plan Note (Signed)
Rx triamcinolone ointment.  

## 2019-05-24 DIAGNOSIS — H2000 Unspecified acute and subacute iridocyclitis: Secondary | ICD-10-CM | POA: Diagnosis not present

## 2019-05-24 DIAGNOSIS — H401431 Capsular glaucoma with pseudoexfoliation of lens, bilateral, mild stage: Secondary | ICD-10-CM | POA: Diagnosis not present

## 2019-06-13 ENCOUNTER — Encounter: Payer: Self-pay | Admitting: Internal Medicine

## 2019-06-22 DIAGNOSIS — H401431 Capsular glaucoma with pseudoexfoliation of lens, bilateral, mild stage: Secondary | ICD-10-CM | POA: Diagnosis not present

## 2019-06-22 DIAGNOSIS — H2000 Unspecified acute and subacute iridocyclitis: Secondary | ICD-10-CM | POA: Diagnosis not present

## 2019-06-24 ENCOUNTER — Other Ambulatory Visit: Payer: Self-pay

## 2019-07-01 ENCOUNTER — Other Ambulatory Visit: Payer: Self-pay

## 2019-07-01 ENCOUNTER — Other Ambulatory Visit: Payer: Self-pay | Admitting: Internal Medicine

## 2019-07-01 DIAGNOSIS — Z20822 Contact with and (suspected) exposure to covid-19: Secondary | ICD-10-CM

## 2019-07-01 DIAGNOSIS — Z20828 Contact with and (suspected) exposure to other viral communicable diseases: Secondary | ICD-10-CM

## 2019-07-02 LAB — SPECIMEN STATUS REPORT

## 2019-07-02 LAB — NOVEL CORONAVIRUS, NAA: SARS-CoV-2, NAA: NOT DETECTED

## 2019-07-09 ENCOUNTER — Encounter: Payer: Self-pay | Admitting: Internal Medicine

## 2019-08-06 ENCOUNTER — Ambulatory Visit (INDEPENDENT_AMBULATORY_CARE_PROVIDER_SITE_OTHER): Payer: PPO | Admitting: Internal Medicine

## 2019-08-06 ENCOUNTER — Other Ambulatory Visit: Payer: Self-pay

## 2019-08-06 ENCOUNTER — Encounter: Payer: Self-pay | Admitting: Internal Medicine

## 2019-08-06 ENCOUNTER — Other Ambulatory Visit (INDEPENDENT_AMBULATORY_CARE_PROVIDER_SITE_OTHER): Payer: PPO

## 2019-08-06 VITALS — BP 142/96 | HR 104 | Temp 98.5°F | Ht 63.0 in | Wt 127.0 lb

## 2019-08-06 DIAGNOSIS — I10 Essential (primary) hypertension: Secondary | ICD-10-CM

## 2019-08-06 DIAGNOSIS — R399 Unspecified symptoms and signs involving the genitourinary system: Secondary | ICD-10-CM

## 2019-08-06 DIAGNOSIS — K529 Noninfective gastroenteritis and colitis, unspecified: Secondary | ICD-10-CM | POA: Diagnosis not present

## 2019-08-06 LAB — URINALYSIS, ROUTINE W REFLEX MICROSCOPIC
Ketones, ur: 15 — AB
Nitrite: POSITIVE — AB
Specific Gravity, Urine: >=1.03 — AB (ref 1.000–1.030)
Total Protein, Urine: >=300 — AB
Urine Glucose: NEGATIVE
Urobilinogen, UA: 1 (ref 0.0–1.0)
pH: 6 (ref 5.0–8.0)

## 2019-08-06 MED ORDER — CIPROFLOXACIN HCL 500 MG PO TABS: 500.0000 mg | ORAL_TABLET | Freq: Two times a day (BID) | ORAL | 0 refills | Status: AC

## 2019-08-06 NOTE — Progress Notes (Signed)
Subjective:    Patient ID: Jo Morrison, female    DOB: 12/21/1945, 73 y.o.   MRN: [id number]  HPI  Here with c/o 2 days onset dysuria, frequency and gross hematuria with lower abd and right flank and lower back pain, but Denies urinary symptoms such as urgency, or n/v, fever, chills.  Pt denies chest pain, increased sob or doe, wheezing, orthopnea, PND, increased LE swelling, palpitations, dizziness or syncope.  Denies worsening reflux, abd pain, dysphagia, n/v, or blood, but has chronic ongoing diarrhea with f/u appt GI soon.   Pt denies wt loss, night sweats, loss of appetite, or other constitutional symptoms Past Medical History:  Diagnosis Date  . Allergy   . Anxiety   . Asthma   . Breast cancer ([place]) 2012   left  . Breast cancer, right ([place]) 1996   right  . Cataract    cataracts removed  . Depressive disorder   . DVT (deep venous thrombosis) ([place])   . Glaucoma    not on eye drops at this time.  . Hypertension   . Olecranon bursitis   . Osteoarthritis   . Other disorders of bone and cartilage(733.99)   . Seasonal allergies   . Unspecified pruritic disorder    Past Surgical History:  Procedure Laterality Date  . APPENDECTOMY  1958  . BREAST LUMPECTOMY Right 1996   Dr. Corena Herter in [place]  . BREAST LUMPECTOMY Left 2011   Dr. Pleas Patricia  . cataract Bilateral   . COLONOSCOPY  2011-last  . DUPUYTREN CONTRACTURE RELEASE    . POLYPECTOMY    . ROTATOR CUFF REPAIR  2007  . TONSILLECTOMY  1967    reports that she has never smoked. She has never used smokeless tobacco. She reports current alcohol use of about 2.0 standard drinks of alcohol per week. She reports that she does not use drugs. family history includes Colon cancer (age of onset: 16) in her maternal aunt; Heart disease in her brother; Stroke (age of onset: 75) in her father. Allergies  Allergen Reactions  . Latex Shortness Of Breath   Current Outpatient Medications on File Prior to Visit  Medication Sig Dispense  Refill  . Albuterol Sulfate (PROAIR RESPICLICK) 123XX123 (90 BASE) MCG/ACT AEPB Inhale 2 puffs into the lungs every 4 (four) hours as needed (SOB). 1 each 0  . budesonide (PULMICORT) 180 MCG/ACT inhaler Inhale 2 puffs into the lungs 2 (two) times daily.    . clonazePAM (KLONOPIN) 0.5 MG tablet Take 1 tablet (0.5 mg total) by mouth at bedtime as needed. 30 tablet 0  . fluticasone (FLONASE) 50 MCG/ACT nasal spray Place into the nose.    . gabapentin (NEURONTIN) 100 MG capsule Take 100 mg by mouth 2 (two) times daily. Patient takes 100 mg in the AM and 200 mg at night  3  . hydrochlorothiazide (HYDRODIURIL) 25 MG tablet TAKE 1 TABLET(25 MG) BY MOUTH DAILY 90 tablet 2  . methocarbamol (ROBAXIN) 500 MG tablet Take 1 tablet (500 mg total) by mouth every 8 (eight) hours as needed for muscle spasms. 60 tablet 0  . montelukast (SINGULAIR) 10 MG tablet TAKE 1 TABLET(10 MG) BY MOUTH AT BEDTIME 90 tablet 2  . traZODone (DESYREL) 100 MG tablet Take 1-2 tablets (100-200 mg total) by mouth at bedtime as needed for sleep. 180 tablet 3  . traZODone (DESYREL) 150 MG tablet Take 150 mg by mouth at bedtime.    . triamcinolone cream (KENALOG) 0.1 % Apply 1 application topically  2 (two) times daily. 100 g 0  . venlafaxine XR (EFFEXOR-XR) 150 MG 24 hr capsule Take 1 capsule (150 mg total) by mouth daily. Take one tablet by mouth once daily 90 capsule 3   No current facility-administered medications on file prior to visit.    Review of Systems  Constitutional: Negative for other unusual diaphoresis or sweats HENT: Negative for ear discharge or swelling Eyes: Negative for other worsening visual disturbances Respiratory: Negative for stridor or other swelling  Gastrointestinal: Negative for worsening distension or other blood Genitourinary: Negative for retention or other urinary change Musculoskeletal: Negative for other MSK pain or swelling Skin: Negative for color change or other new lesions Neurological: Negative for  worsening tremors and other numbness  Psychiatric/Behavioral: Negative for worsening agitation or other fatigue All other system neg per pt    Objective:   Physical Exam BP (!) 142/96   Pulse (!) 104   Temp 98.5 F (36.9 C) (Oral)   Ht 5\' 3"  (1.6 m)   Wt 127 lb (57.6 kg)   SpO2 97%   BMI 22.50 kg/m  VS noted,  Constitutional: Pt appears in NAD HENT: Head: NCAT.  Right Ear: External ear normal.  Left Ear: External ear normal.  Eyes: . Pupils are equal, round, and reactive to light. Conjunctivae and EOM are normal Nose: without d/c or deformity Neck: Neck supple. Gross normal ROM Cardiovascular: Normal rate and regular rhythm.   Pulmonary/Chest: Effort normal and breath sounds without rales or wheezing.  Abd:  Soft, ND, + BS, no organomegaly, with low mid abd tender and right flank tender Neurological: Pt is alert. At baseline orientation, motor grossly intact Skin: Skin is warm. No rashes, other new lesions, no LE edema Psychiatric: Pt behavior is normal without agitation  No other exam findings Lab Results  Component Value Date   WBC 10.6 (H) 09/07/2018   HGB 14.3 09/07/2018   HCT 42.9 09/07/2018   PLT 275.0 09/07/2018   GLUCOSE 96 09/07/2018   CHOL 181 09/07/2018   TRIG 90.0 09/07/2018   HDL 71.70 09/07/2018   LDLCALC 91 09/07/2018   ALT 37 (H) 09/07/2018   AST 26 09/07/2018   NA 141 09/07/2018   K 3.4 (L) 09/07/2018   CL 99 09/07/2018   CREATININE 0.71 09/07/2018   BUN 11 09/07/2018   CO2 35 (H) 09/07/2018   TSH 2.920 06/09/2014          Assessment & Plan:

## 2019-08-07 ENCOUNTER — Encounter: Payer: Self-pay | Admitting: Internal Medicine

## 2019-08-07 DIAGNOSIS — K529 Noninfective gastroenteritis and colitis, unspecified: Secondary | ICD-10-CM | POA: Insufficient documentation

## 2019-08-07 DIAGNOSIS — R399 Unspecified symptoms and signs involving the genitourinary system: Secondary | ICD-10-CM | POA: Insufficient documentation

## 2019-08-07 NOTE — Assessment & Plan Note (Signed)
With high suspicion for UTI, and possible right pyelonephritis, though afeb and non toxic appearing, Mild to mod, for antibx course,  to f/u any worsening symptoms or concerns

## 2019-08-07 NOTE — Assessment & Plan Note (Signed)
Mild elevated today, possibly reactive, cont same tx, f/u BP at home and next visit

## 2019-08-07 NOTE — Assessment & Plan Note (Signed)
Exam o/w benign, has f/u appt with GI soon, cont same tx

## 2019-08-07 NOTE — Patient Instructions (Signed)
Please take all new medication as prescribed - the antibiotic  Your urine tests are pending  Please continue all other medications as before, and refills have been done if requested.  Please have the pharmacy call with any other refills you may need.  Please continue your efforts at being more active, low cholesterol diet, and weight control  Please keep your appointments with your specialists as you may have planned - GI

## 2019-08-09 LAB — URINE CULTURE
MICRO NUMBER:: 871622
SPECIMEN QUALITY:: ADEQUATE

## 2019-08-10 ENCOUNTER — Other Ambulatory Visit: Payer: Self-pay

## 2019-08-10 ENCOUNTER — Encounter: Payer: Self-pay | Admitting: Internal Medicine

## 2019-08-10 ENCOUNTER — Ambulatory Visit (INDEPENDENT_AMBULATORY_CARE_PROVIDER_SITE_OTHER): Payer: PPO | Admitting: Internal Medicine

## 2019-08-10 VITALS — BP 144/88 | HR 100 | Temp 98.2°F | Ht 63.0 in | Wt 129.0 lb

## 2019-08-10 DIAGNOSIS — K5902 Outlet dysfunction constipation: Secondary | ICD-10-CM | POA: Diagnosis not present

## 2019-08-10 DIAGNOSIS — N3946 Mixed incontinence: Secondary | ICD-10-CM | POA: Diagnosis not present

## 2019-08-10 DIAGNOSIS — R194 Change in bowel habit: Secondary | ICD-10-CM | POA: Diagnosis not present

## 2019-08-10 DIAGNOSIS — F1010 Alcohol abuse, uncomplicated: Secondary | ICD-10-CM | POA: Diagnosis not present

## 2019-08-10 NOTE — Progress Notes (Signed)
Jo Morrison 73 y.o. [birth year] [id number]  Assessment & Plan:   Encounter Diagnoses  Name Primary?   Change in bowel habits Yes   Mixed stress and urge urinary incontinence    Dyssynergic defecation suspected    Excessive drinking alcohol     Functional rectal exam and history of urinary incontinence suggest pelvic floor dysfunction today.  She may have dyssynergic defecation based upon response to functional rectal exam maneuvers.  I am referring her to pelvic floor physical therapy for further help.  Benefiber 1 tablespoon nightly  We discussed how alcohol should be limited to 1 glass of wine a day or 1 occluding the drink.  She understands and agrees to reduce consumption of alcohol which may be contributing to her bowel changes.  I will see her back as needed at this point pending response to physical therapy.  I appreciate the opportunity to care for this patient. CC: Hoyt Koch, MD     Subjective:   Chief Complaint: Diarrhea  HPI The patient is here, last seen a colonoscopy in 2019 with one adenoma removed at colonoscopy, who is complaining of episodic "diarrhea".  She really is describing defecation that comes with almost no warning though there is no incontinence.  Back in 2013 when I first met her she had had some episodes at night and this had occurred with incontinence.  She does say she is drinking 3 to 4 glasses of wine a day because of the COVID pandemic which is caused anxiety and stress, she is worried some about her husband with rheumatoid arthritis and trying to protect him so they have been out much.  She has cut back on the alcohol.  She eats yogurt with fruit in the mornings, but that is been a relatively stable thing.  Only 1 cup of coffee a day no artificial sweeteners.  She also has a fair amount of stress urinary incontinence and some urge urinary incontinence.  No childbirth history.  She is currently being treated for a UTI that she  thinks she may have gotten from episodes of diarrhea.  What she really is describing are these urgent defecation episodes some progressively looser stools but normal stools in between.  Since reducing alcohol last week she has had less of a problem.  It happens maybe once a week or so up to a couple of times a week.  No bleeding. Allergies  Allergen Reactions   Latex Shortness Of Breath   Current Meds  Medication Sig   Albuterol Sulfate (PROAIR RESPICLICK) 778 (90 BASE) MCG/ACT AEPB Inhale 2 puffs into the lungs every 4 (four) hours as needed (SOB).   budesonide (PULMICORT) 180 MCG/ACT inhaler Inhale 2 puffs into the lungs 2 (two) times daily.   ciprofloxacin (CIPRO) 500 MG tablet Take 1 tablet (500 mg total) by mouth 2 (two) times daily for 10 days.   clonazePAM (KLONOPIN) 0.5 MG tablet Take 1 tablet (0.5 mg total) by mouth at bedtime as needed.   fluticasone (FLONASE) 50 MCG/ACT nasal spray Place into the nose.   gabapentin (NEURONTIN) 100 MG capsule Take 100 mg by mouth at bedtime as needed. Patient takes 100 mg at night   hydrochlorothiazide (HYDRODIURIL) 25 MG tablet TAKE 1 TABLET(25 MG) BY MOUTH DAILY   methocarbamol (ROBAXIN) 500 MG tablet Take 1 tablet (500 mg total) by mouth every 8 (eight) hours as needed for muscle spasms.   montelukast (SINGULAIR) 10 MG tablet TAKE 1 TABLET(10 MG) BY MOUTH AT BEDTIME  traZODone (DESYREL) 100 MG tablet Take 1-2 tablets (100-200 mg total) by mouth at bedtime as needed for sleep.   traZODone (DESYREL) 150 MG tablet Take 150 mg by mouth at bedtime.   triamcinolone cream (KENALOG) 0.1 % Apply 1 application topically 2 (two) times daily.   venlafaxine XR (EFFEXOR-XR) 150 MG 24 hr capsule Take 1 capsule (150 mg total) by mouth daily. Take one tablet by mouth once daily   Past Medical History:  Diagnosis Date   Allergy    Anxiety    Asthma    Breast cancer ([place]) 2012   left   Breast cancer, right ([place]) 1996   right   Cataract      cataracts removed   Depressive disorder    DVT (deep venous thrombosis) ([facility])    Glaucoma    not on eye drops at this time.   Hypertension    Olecranon bursitis    Osteoarthritis    Other disorders of bone and cartilage(733.99)    Seasonal allergies    Unspecified pruritic disorder    Past Surgical History:  Procedure Laterality Date   APPENDECTOMY  1958   BREAST LUMPECTOMY Right 1996   Dr. Corena Herter in [place]   BREAST LUMPECTOMY Left 2011   Dr. Pleas Patricia   cataract Bilateral    COLONOSCOPY  2011-last   DUPUYTREN CONTRACTURE RELEASE     POLYPECTOMY     ROTATOR CUFF REPAIR  2007   [place]   Social History   Social History Narrative   Married no children   Wine 3 to 4 glasses a day   Never smoker   No drug use   family history includes Colon cancer (age of onset: 42) in her maternal aunt; Heart disease in her brother; Stroke (age of onset: 44) in her father.   Review of Systems As above  Objective:   Physical Exam  BP (!) 144/88 (BP Location: Left Arm, Patient Position: Sitting, Cuff Size: Normal)    Pulse 100    Temp 98.2 F (36.8 C) (Oral)    Ht '5\' 3"'$  (1.6 m)    Wt 129 lb (58.5 kg)    BMI 22.85 kg/m   Patti Martinique, CMA present.   Anoderm inspection revealed NL anoderm Anal wink was absent Digital exam revealed decreased resting tone and voluntary squeeze. Formed stool filling vault. No mass or rectocele present. Simulated defecation with valsalva revealed appropriate abdominal contraction but reduced descent descent.

## 2019-08-10 NOTE — Patient Instructions (Signed)
We are referring you for pelvic floor physical therapy. They will contact you about an appointment.   We are giving you a handout on benefiber to read and follow. One tablespoon daily.   Try and cut back on your wine consumption.    Follow up with Dr. Carlean Purl as needed.   I appreciate the opportunity to care for you. Silvano Rusk, MD, [facility]

## 2019-08-17 ENCOUNTER — Ambulatory Visit: Payer: PPO | Attending: Internal Medicine | Admitting: Physical Therapy

## 2019-08-17 ENCOUNTER — Encounter: Payer: Self-pay | Admitting: Physical Therapy

## 2019-08-17 ENCOUNTER — Other Ambulatory Visit: Payer: Self-pay

## 2019-08-17 DIAGNOSIS — M6281 Muscle weakness (generalized): Secondary | ICD-10-CM | POA: Insufficient documentation

## 2019-08-17 DIAGNOSIS — N3946 Mixed incontinence: Secondary | ICD-10-CM | POA: Diagnosis not present

## 2019-08-17 DIAGNOSIS — R278 Other lack of coordination: Secondary | ICD-10-CM | POA: Insufficient documentation

## 2019-08-17 DIAGNOSIS — R159 Full incontinence of feces: Secondary | ICD-10-CM | POA: Diagnosis not present

## 2019-08-17 NOTE — Therapy (Signed)
[facility] [address], [place], [place], [address] Phone: [phone]   Fax:  [phone]  Physical Therapy Evaluation  Patient Details  Name: Jo Morrison MRN: [id number] Date of Birth: 17-Apr-1946 Referring Provider (PT): Dr. Silvano Rusk   Encounter Date: 08/17/2019  PT End of Session - 08/17/19 1318    Visit Number  1    Date for PT Re-Evaluation  10/12/19    Authorization Type  [employer]    PT Start Time  1110   PT late from previous eval   PT Stop Time  1145    PT Time Calculation (min)  35 min    Activity Tolerance  Patient tolerated treatment well;No increased pain    Behavior During Therapy  WFL for tasks assessed/performed       Past Medical History:  Diagnosis Date  . Allergy   . Anxiety   . Asthma   . Breast cancer ([place]) 2012   left  . Breast cancer, right ([place]) 1996   right  . Cataract    cataracts removed  . Depressive disorder   . DVT (deep venous thrombosis) ([place])   . Glaucoma    not on eye drops at this time.  . Hypertension   . Olecranon bursitis   . Osteoarthritis   . Other disorders of bone and cartilage(733.99)   . Seasonal allergies   . Unspecified pruritic disorder     Past Surgical History:  Procedure Laterality Date  . APPENDECTOMY  1958  . BREAST LUMPECTOMY Right 1996   Dr. Corena Herter in [place]  . BREAST LUMPECTOMY Left 2011   Dr. Pleas Patricia  . cataract Bilateral   . COLONOSCOPY  2011-last  . DUPUYTREN CONTRACTURE RELEASE    . POLYPECTOMY    . ROTATOR CUFF REPAIR  2007  . TONSILLECTOMY  1967    There were no vitals filed for this visit.   Subjective Assessment - 08/17/19 1114    Subjective  Patient has had problems with diarrhea and no warning of the diarrhea. If patient laughs too hard or drink alot of water does not make it to the bathroom in time. Patient wearing diapers. Patient wears 15 diapers per month.    Patient Stated Goals  control the urination and  diahrrhea    Currently in Pain?  No/denies         [facility] PT Assessment - 08/17/19 0001      Assessment   Medical Diagnosis  N39.46 Mixed stress and urge urinary incontinence    Referring Provider (PT)  Dr. Silvano Rusk    Onset Date/Surgical Date  --   2016   Prior Therapy  no      Precautions   Precautions  Other (comment)    Precaution Comments  breast cancer estrogen positive      Restrictions   Weight Bearing Restrictions  No      Balance Screen   Has the patient fallen in the past 6 months  No    Has the patient had a decrease in activity level because of a fear of falling?   No    Is the patient reluctant to leave their home because of a fear of falling?   No      Home Film/video editor residence      Prior Function   Level of Independence  Independent    Vocation  Retired    Leisure  gardening, walking  Cognition   Overall Cognitive Status  Within Functional Limits for tasks assessed      Posture/Postural Control   Posture/Postural Control  No significant limitations    Posture Comments  scoliosis      ROM / Strength   AROM / PROM / Strength  AROM;PROM;Strength      AROM   Overall AROM Comments  lumbar ROM limited by 25%      PROM   Right Hip External Rotation   45    Left Hip External Rotation   30      Strength   Overall Strength Comments  abdominal strength is 2/5 for lower abdominals    Right Hip External Rotation   4/5    Right Hip ABduction  4-/5    Left Hip External Rotation  4/5    Left Hip ABduction  4-/5      Palpation   Palpation comment  tightness in the diaphragm,                 Objective measurements completed on examination: See above findings.    Pelvic Floor Special Questions - 08/17/19 0001    Prior Pregnancies  No    Currently Sexually Active  No   if dryness is corrected then wants intercourse   Urinary Leakage  Yes    Pad use  15 diapers per month    Activities that cause leaking   With strong urge;Coughing;Laughing;Sneezing;Bending    Urinary urgency  Yes   urine   Urinary frequency  every hour, BM 1 per day but if diarrhea 3-4x per day    Fecal incontinence  Yes   when has diarrhea   Fluid intake  lots of water    Falling out feeling (prolapse)  No    Skin Integrity  Intact    Pelvic Floor Internal Exam  Patient confirms identification and approves PT to assess pelvic floor and muscles    Exam Type  Vaginal;Rectal    Sensation  vaginal canal is smaller, tightness anterior and posterior    Palpation  vaginal is 2/5    Strength  fair squeeze, definite lift   RECTAL              PT Education - 08/17/19 1314    Education Details  pelvic floor contraction in sidely, vaginal moisturizers    Person(s) Educated  Patient    Methods  Explanation;Demonstration;Handout    Comprehension  Verbalized understanding;Returned demonstration       PT Short Term Goals - 08/17/19 1327      PT SHORT TERM GOAL #1   Title  independent with intial HEP    Time  4    Period  Weeks    Status  New    Target Date  09/14/19      PT SHORT TERM GOAL #2   Title  urinary and fecal leakage decreased >/= 25% so she s down to 10 pads per month    Time  4    Period  Weeks    Status  New    Target Date  09/14/19      PT SHORT TERM GOAL #3   Title  understand correct toileting technique    Time  4    Period  Weeks    Status  New        PT Long Term Goals - 08/17/19 1328      PT LONG TERM GOAL #1   Title  independent  with advanced HEP    Time  8    Period  Weeks    Status  New    Target Date  10/12/19      PT LONG TERM GOAL #2   Title  urinary and fecal leakage decreased >/= 75% so patient is down to 1-2 pads per month    Time  8    Period  Weeks    Status  New    Target Date  10/12/19      PT LONG TERM GOAL #3   Title  able to feel the urge to urinate and urinate every 2 hours    Time  8    Period  Weeks    Status  New    Target Date  10/12/19      PT  LONG TERM GOAL #4   Title  able to attempt intercourse with her husband due to elongation of the pelvic floor muscles    Time  8    Period  Weeks    Status  New    Target Date  10/12/19             Plan - 08/17/19 1319    Clinical Impression Statement  Patient is a 73 year old female with mixed incontinence for the past 4 years. Patient is s/p breast cancer estrogen positive. Patient wears 15 diapers per month. Patient will leak urine with movement and strong urge. Patient has to urinate every hour. She has 1 bowel movement if Type 4 but if Type 7 will have 3-4 per day. Vaginal strength is 2/5 with stronger contraction anterior and posterior. Patient has been taking Psyllum and has not had loose stool. Patient is not sexually active due to vaginal dryness but would like to resume activity with her husband. Hip P/ROM on right is 45 degrees and left is 30 degrees. Abodminal strength is 2/5 and bilateral hip external rotation and abduction is 4/5 Tightness in the diaphragm. Patient will benefit from skilled therapy to reduce urinary leakage and fecal leakage by improving pelvic health and coordination.    Personal Factors and Comorbidities  Comorbidity 1;Sex;Age    Comorbidities  Breast cancer estrogen positive    Examination-Activity Limitations  Transfers;Squat;Continence;Toileting;Stand    Examination-Participation Restrictions  Community Activity;Interpersonal Relationship    Stability/Clinical Decision Making  Evolving/Moderate complexity    Clinical Decision Making  Low    Rehab Potential  Excellent    PT Frequency  1x / week    PT Duration  8 weeks    PT Treatment/Interventions  Biofeedback;Cryotherapy;Electrical Stimulation;Moist Heat;Therapeutic activities;Therapeutic exercise;Patient/family education;Neuromuscular re-education;Manual techniques;Passive range of motion;Dry needling;Joint Manipulations    PT Next Visit Plan  work on left hip mobility, diaphgram mobility, vaginal  health, soft tissue work to the vagina, education on vaginal dilators, hip stretchs, bladder irritants, abdominal massage    Consulted and Agree with Plan of Care  Patient       Patient will benefit from skilled therapeutic intervention in order to improve the following deficits and impairments:  Decreased coordination, Decreased range of motion, Increased fascial restricitons, Decreased endurance, Increased muscle spasms, Decreased activity tolerance, Impaired flexibility, Decreased mobility, Decreased strength  Visit Diagnosis: Muscle weakness (generalized) - Plan: PT plan of care cert/re-cert  Other lack of coordination - Plan: PT plan of care cert/re-cert  Mixed incontinence - Plan: PT plan of care cert/re-cert  Incontinence of feces, unspecified fecal incontinence type - Plan: PT plan of care cert/re-cert  Problem List Patient Active Problem List   Diagnosis Date Noted  . UTI symptoms 08/07/2019  . Chronic diarrhea 08/07/2019  . Rash 05/17/2019  . Low back pain 03/01/2019  . Routine general medical examination at a health care facility 08/16/2016  . Carpal tunnel syndrome, right 02/08/2016  . Ductal carcinoma in situ (DCIS) of left breast 10/28/2014  . Contracture of palmar fascia (Dupuytren's) 07/13/2014  . Hypertension   . Anxiety   . Depressive disorder   . Asthma   . Osteoarthritis   . Glaucoma   . Hx of adenomatous colonic polyps 11/01/2012  . Breast cancer of lower-outer quadrant of right female breast ([place]) 10/12/2012    Earlie Counts, PT 08/17/19 1:34 PM   [facility] [address] [place], [place], [address] Phone: [phone]   Fax:  [phone]  Name: Jo Morrison MRN: [id number] Date of Birth: [birth year]

## 2019-08-17 NOTE — Patient Instructions (Addendum)
Slow Contraction: Gravity Eliminated (Side-Lying)    Lie on  side, hips and knees slightly bent. Slowly squeeze anus for __5_ seconds. Rest for _10__ seconds. Repeat _10__ times. Do _2__ times a day.   Copyright  VHI. All rights reserved.  Moisturizers . They are used in the vagina to hydrate the mucous membrane that make up the vaginal canal. . Designed to keep a more normal acid balance (ph) . Once placed in the vagina, it will last between two to three days.  . Use 2-3 times per week at bedtime  . Ingredients to avoid is glycerin and fragrance, can increase chance of infection . Should not be used just before sex due to causing irritation . Most are gels administered either in a tampon-shaped applicator or as a vaginal suppository. They are non-hormonal.   Types of Moisturizers-placed up in the vaginal canal  . Vitamin E vaginal suppositories- Whole foods, Amazon . Moist Again . Coconut oil- can break down condoms . Julva- (Do no use if on Tamoxifen) amazon . Yes moisturizer- amazon . NeuEve Silk , NeuEve Silver for menopausal or over 65 (if have severe vaginal atrophy or cancer treatments use NeuEve Silk for  1 month than move to [employer]eps[employer])- [employer], MapleFlower.dk . Olive and Bee intimate cream- www.oliveandbee.com.au . Mae vaginal [place] . Aloe   Creams to use externally on the Vulva area  Albertson's (good for for cancer patients that had radiation to the area)- [place] or [employer].FlyingBasics.com.br  V-magic cream - amazon  Julva-amazon  Vital "V Wild Yam salve ( help moisturize and help with thinning vulvar area, does have [place] by Irwin Brakeman labial moisturizer ([place],   Coconut or olive oil  aloe   Things to avoid in the vaginal area . Do not use things to irritate the vulvar area . No lotions just specialized creams for the vulva area- Neogyn, V-magic, No  soaps; can use Aveeno or Calendula cleanser if needed. Must be gentle . No deodorants . No douches . Good to sleep without underwear to let the vaginal area to air out . No scrubbing: spread the lips to let warm water rinse over labias and pat dry  [facility] [address], [place], [place] [address] Phone # [phone] Fax [phone]

## 2019-08-18 DIAGNOSIS — H2000 Unspecified acute and subacute iridocyclitis: Secondary | ICD-10-CM | POA: Diagnosis not present

## 2019-08-18 DIAGNOSIS — H401431 Capsular glaucoma with pseudoexfoliation of lens, bilateral, mild stage: Secondary | ICD-10-CM | POA: Diagnosis not present

## 2019-08-22 NOTE — Progress Notes (Signed)
[facility] Note  08/23/2019     CHIEF COMPLAINT Patient presents for Retina Evaluation   HISTORY OF PRESENT ILLNESS: Jo Morrison is a 73 y.o. female who presents to the clinic today for:   HPI    Retina Evaluation    In right eye.  This started 4 months ago.  Duration of 4 months.  Associated Symptoms Photophobia and Pain.  Negative for Flashes, Blind Spot, Scalp Tenderness, Fever, Floaters, Glare, Jaw Claudication, Weight Loss, Distortion, Redness, Trauma, Shoulder/Hip pain and Fatigue.  Context:  distance vision, mid-range vision and near vision.  Treatments tried include eye drops.  Response to treatment was mild improvement.  I, the attending physician,  performed the HPI with the patient and updated documentation appropriately.          Comments    Patient states for the past 4 months or so, has had light sensitivity and fluctuating vision OD following laser procedure with Dr. Kathlen Mody to lower IOP OD.Has some eye pain OD if moving eye or touching eye. Has glaucoma OU, but laser was successful in lowering IOP OS. IOP was 31OD on 09.23.20 at Dr. Kathleen Argue office. Had come down to 19 on 09.25.20. Using inveltys and cosopt tid OD. Has had LASIK OU in [place] about 23 years ago. Also, s/p CE with IOL OU with Dr. Lucita Ferrara 2-3 years ago.       Last edited by Bernarda Caffey, MD on 08/23/2019  1:11 PM. (History)    pt was referred here by Dr. Kathlen Mody for uveitis, pt states she is using preservative free Cosopt and inveltys TID as of Friday, prior to that she was using them BID, pt states she feels like her eyes might be doing better since increasing the drops, pt had cataract sx with Dr. Lucita Ferrara, pt states she has moved here from [place], she states she has a hx of lasik (in [place]) and SLT with Dr. Kathlen Mody, pt states the SLT procedure was about 6 months ago, she states ever since then she has been on drops for inflammation, she states Dr. Kathlen Mody used PF to  begin with and then put her on the pressure drops  Referring physician: Hortencia Pilar, MD [place],  [place] [address]  HISTORICAL INFORMATION:   Selected notes from the MEDICAL RECORD NUMBER Referred by Dr. Quentin Ore for concern of uveitis OD LEE: 09.23.20 Read Drivers) [BCVA: OD: 20/60-- OS: 20/30] Ocular Hx-pseudoexfloiation glaucoma (s/p SLT OU), drusen OU, DES, pseudo OU (2016), lasik (1996) PMH-HTN, cancer, depression   CURRENT MEDICATIONS: Current Outpatient Medications (Ophthalmic Drugs)  Medication Sig  . dorzolamide-timolol (COSOPT) 22.3-6.8 MG/ML ophthalmic solution Place 1 drop into the right eye 3 (three) times daily.  . Bromfenac Sodium (PROLENSA) 0.07 % SOLN Place 1 drop into the right eye 4 (four) times daily.   No current facility-administered medications for this visit.  (Ophthalmic Drugs)   Current Outpatient Medications (Other)  Medication Sig  . Albuterol Sulfate (PROAIR RESPICLICK) 123XX123 (90 BASE) MCG/ACT AEPB Inhale 2 puffs into the lungs every 4 (four) hours as needed (SOB).  . budesonide (PULMICORT) 180 MCG/ACT inhaler Inhale 2 puffs into the lungs 2 (two) times daily.  . clonazePAM (KLONOPIN) 0.5 MG tablet Take 1 tablet (0.5 mg total) by mouth at bedtime as needed.  . fluticasone (FLONASE) 50 MCG/ACT nasal spray Place into the nose.  . gabapentin (NEURONTIN) 100 MG capsule Take 100 mg by mouth at bedtime as needed. Patient  takes 100 mg at night  . hydrochlorothiazide (HYDRODIURIL) 25 MG tablet TAKE 1 TABLET(25 MG) BY MOUTH DAILY  . methocarbamol (ROBAXIN) 500 MG tablet Take 1 tablet (500 mg total) by mouth every 8 (eight) hours as needed for muscle spasms.  . montelukast (SINGULAIR) 10 MG tablet TAKE 1 TABLET(10 MG) BY MOUTH AT BEDTIME  . traZODone (DESYREL) 100 MG tablet Take 1-2 tablets (100-200 mg total) by mouth at bedtime as needed for sleep.  . traZODone (DESYREL) 150 MG tablet Take 150 mg by mouth at bedtime.  . triamcinolone  cream (KENALOG) 0.1 % Apply 1 application topically 2 (two) times daily.  Marland Kitchen venlafaxine XR (EFFEXOR-XR) 150 MG 24 hr capsule Take 1 capsule (150 mg total) by mouth daily. Take one tablet by mouth once daily   No current facility-administered medications for this visit.  (Other)      REVIEW OF SYSTEMS: ROS    Positive for: Cardiovascular, Eyes, Respiratory   Negative for: Constitutional, Gastrointestinal, Neurological, Skin, Genitourinary, Musculoskeletal, HENT, Endocrine, Psychiatric, Allergic/Imm, Heme/Lymph   Last edited by Roselee Nova D, COT on 08/23/2019  9:47 AM. (History)       ALLERGIES Allergies  Allergen Reactions  . Latex Shortness Of Breath    PAST MEDICAL HISTORY Past Medical History:  Diagnosis Date  . Allergy   . Anxiety   . Asthma   . Breast cancer ([place]) 2012   left  . Breast cancer, right ([place]) 1996   right  . Cataract    cataracts removed  . Depressive disorder   . DVT (deep venous thrombosis) ([place])   . Glaucoma    not on eye drops at this time.  . Hypertension   . Olecranon bursitis   . Osteoarthritis   . Other disorders of bone and cartilage(733.99)   . Seasonal allergies   . Unspecified pruritic disorder    Past Surgical History:  Procedure Laterality Date  . APPENDECTOMY  1958  . BREAST LUMPECTOMY Right 1996   Dr. Corena Herter in [place]  . BREAST LUMPECTOMY Left 2011   Dr. Pleas Patricia  . cataract Bilateral   . CATARACT EXTRACTION Bilateral    Dr. Lucita Ferrara  . COLONOSCOPY  2011-last  . DUPUYTREN CONTRACTURE RELEASE    . LASIK Bilateral 2007  . POLYPECTOMY    . ROTATOR CUFF REPAIR  2007  . TONSILLECTOMY  1967    FAMILY HISTORY Family History  Problem Relation Age of Onset  . Stroke Father 25  . Heart disease Brother   . Colon cancer Maternal Aunt 45  . Macular degeneration Mother   . Colon polyps Neg Hx   . Esophageal cancer Neg Hx   . Rectal cancer Neg Hx   . Stomach cancer Neg Hx     SOCIAL HISTORY Social History    Tobacco Use  . Smoking status: Never Smoker  . Smokeless tobacco: Never Used  Substance Use Topics  . Alcohol use: Yes    Alcohol/week: 25.0 standard drinks    Types: 25 Glasses of wine per week  . Drug use: No         OPHTHALMIC EXAM:  Base Eye Exam    Visual Acuity (Snellen - Linear)      Right Left   Dist cc 20/100 +2 20/30 -2   Dist ph cc NI NI   Correction: Glasses       Tonometry (Tonopen, 10:00 AM)      Right Left   Pressure 21 17  Pupils      Dark Light Shape React APD   Right 4 4 Round None None   Left 3 2 Round Brisk None       Visual Fields (Counting fingers)      Left Right    Full Full       Extraocular Movement      Right Left    Full, Ortho Full, Ortho       Neuro/Psych    Oriented x3: Yes   Mood/Affect: Normal       Dilation    Both eyes: 1.0% Mydriacyl, 2.5% Phenylephrine @ 10:01 AM        Slit Lamp and Fundus Exam    Slit Lamp Exam      Right Left   Lids/Lashes Dermatochalasis - upper lid, Meibomian gland dysfunction Dermatochalasis - upper lid, Meibomian gland dysfunction   Conjunctiva/Sclera White and quiet White and quiet   Cornea Mild Arcus, well healed lasik flap, Well healed temporal cataract wounds Mild Arcus, well healed lasik flap, Well healed temporal cataract wounds, 2+ Punctate epithelial erosions   Anterior Chamber Deep and quiet, no cell or flare Deep and quiet, no cell or flare   Iris Round and moderately dilated to 78mm Round and moderately dilated to 5.41mm   Lens PC IOL in good position PC IOL in good position   Vitreous Vitreous syneresis, no vit cells Vitreous syneresis       Fundus Exam      Right Left   Disc Pink and Sharp, temporal PPP Pink and Sharp, temporal PPA   C/D Ratio 0.6 0.6   Macula Flat, Blunted foveal reflex, +central Cystic changes Flat, good foveal reflex, mild Retinal pigment epithelial mottling, No heme or edema   Vessels Vascular attenuation, Tortuous, no sheathing or vasculitis  Vascular attenuation, Tortuous, no sheathing or vasculitis   Periphery Attached, pigmented CR scar at 0400 equator, reticular degeneration Attached, reticular degeneration        Refraction    Wearing Rx      Sphere Cylinder Axis   Right -1.75 +1.00 007   Left -2.50 +1.00 157       Manifest Refraction      Sphere Cylinder Axis Dist VA   Right -1.75 +1.00 008 20/40-2   Left -2.50 +1.00 156 20/20-2          IMAGING AND PROCEDURES  Imaging and Procedures for @TODAY @  OCT, Retina - OU - Both Eyes       Right Eye Quality was good. Central Foveal Thickness: 337. Progression has no prior data. Findings include abnormal foveal contour, intraretinal fluid, no SRF, cystoid macular edema.   Left Eye Quality was good. Central Foveal Thickness: 244. Progression has no prior data. Findings include normal foveal contour, no IRF, no SRF.   Notes *Images captured and stored on drive  Diagnosis / Impression:  OD: central cystic changes / CME OS: NFP, no IRF/SRF  Clinical management:  See below  Abbreviations: NFP - Normal foveal profile. CME - cystoid macular edema. PED - pigment epithelial detachment. IRF - intraretinal fluid. SRF - subretinal fluid. EZ - ellipsoid zone. ERM - epiretinal membrane. ORA - outer retinal atrophy. ORT - outer retinal tubulation. SRHM - subretinal hyper-reflective material        Fluorescein Angiography Optos (Transit OD)       Right Eye   Progression has no prior data. Early phase findings include staining. Mid/Late phase findings include staining, leakage (Perifoveal petaloid hyper  florescent leakage, hyperfluorescence of disc).   Left Eye   Progression has no prior data. Early phase findings include normal observations. Mid/Late phase findings include normal observations.   Notes Images stored on drive;   Impression: OD: CME; Perifoveal petaloid hyper florescent leakage, hyperfluorescence of disc; CR atrophy at 0400 equator OS: normal  study                  ASSESSMENT/PLAN:    ICD-10-CM   1. CME (cystoid macular edema), right  H35.351   2. Retinal edema  H35.81 OCT, Retina - OU - Both Eyes  3. Pseudoexfoliation (PXF) glaucoma, unspecified glaucoma stage  H40.1490   4. Steroid responder, right eye  H40.041   5. Essential hypertension  I10   6. Hypertensive retinopathy of both eyes  H35.033   7. Pseudophakia of both eyes  Z96.1   8. Hx of LASIK  Z98.890     1,2. 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 History of pseudoexfoliation glaucoma and steroid response OD  - under the expert management of Dr. Kathlen Mody  - s/p SLT x2  - tmax 32 OD while on Pred-Forte  - IOP 21 -- didn't take Cosopt this morning  - continue PF Cosopt TID OD  5,6. Hypertensive retinopathy OU  - discussed importance of tight BP control  - monitor  7. Pseudophakia OU  - s/p CE/IOL (Dr. Astrid Drafts)  - IOLs in good position  - monitor  8. History of Lasik  - 23 years ago in [place] Ordered this visit:  Meds ordered this encounter  Medications  . Bromfenac Sodium (PROLENSA) 0.07 % SOLN    Sig: Place 1 drop into the right eye 4 (four) times daily.    Dispense:  6 mL    Refill:  3       Return for f/u 3-4 weeks CME OD.  There are no Patient Instructions on file for this visit.   Explained the diagnoses, plan, and follow up with the patient and they expressed understanding.  Patient expressed understanding of the importance of proper follow up care.   This  document serves as a record of services personally performed by Gardiner Sleeper, MD, PhD. It was created on their behalf by Ernest Mallick, OA, an ophthalmic assistant. The creation of this record is the provider's dictation and/or activities during the visit.    Electronically signed by: Ernest Mallick, OA  09.27.2020 1:22 PM    Gardiner Sleeper, M.D., Ph.D. Diseases & Surgery of the Retina and Vitreous [facility] [address]  I have reviewed the above documentation for accuracy and completeness, and I agree with the above. Gardiner Sleeper, M.D., Ph.D. 08/23/19 1:22 PM     Abbreviations: M myopia (nearsighted); A astigmatism; H hyperopia (farsighted); P presbyopia; Mrx spectacle prescription;  CTL contact lenses; OD right eye; OS left eye; OU both eyes  XT exotropia; ET esotropia; PEK punctate epithelial keratitis; PEE punctate epithelial erosions; DES dry eye syndrome;  MGD meibomian gland dysfunction; ATs artificial tears; PFAT's preservative free artificial tears; [place] nuclear sclerotic cataract; PSC posterior subcapsular cataract; ERM epi-retinal membrane; PVD posterior vitreous detachment; RD retinal detachment; DM diabetes mellitus; DR diabetic retinopathy; NPDR non-proliferative diabetic retinopathy; PDR proliferative diabetic retinopathy; CSME clinically significant macular edema; DME diabetic macular edema; dbh dot blot hemorrhages; CWS cotton wool spot; POAG primary open angle glaucoma; C/D cup-to-disc ratio; HVF humphrey visual field; GVF goldmann visual field; OCT optical coherence tomography; IOP intraocular pressure; BRVO Branch retinal vein occlusion; CRVO central retinal vein occlusion; CRAO central retinal artery occlusion; BRAO branch retinal artery occlusion; RT retinal tear; SB scleral buckle; PPV pars plana vitrectomy; VH Vitreous hemorrhage; PRP panretinal laser photocoagulation; IVK intravitreal kenalog; VMT vitreomacular traction; MH Macular hole;  NVD neovascularization  of the disc; NVE neovascularization elsewhere; AREDS age related eye disease study; ARMD age related macular degeneration; POAG primary open angle glaucoma; EBMD epithelial/anterior basement membrane dystrophy; ACIOL anterior chamber intraocular lens; IOL intraocular lens; PCIOL posterior chamber intraocular lens; Phaco/IOL phacoemulsification with intraocular lens placement; [place] photorefractive keratectomy; LASIK laser assisted in situ keratomileusis; HTN hypertension; DM diabetes mellitus; COPD chronic obstructive pulmonary disease

## 2019-08-23 ENCOUNTER — Encounter (INDEPENDENT_AMBULATORY_CARE_PROVIDER_SITE_OTHER): Payer: Self-pay | Admitting: Ophthalmology

## 2019-08-23 ENCOUNTER — Encounter: Payer: Self-pay | Admitting: Internal Medicine

## 2019-08-23 ENCOUNTER — Other Ambulatory Visit: Payer: Self-pay

## 2019-08-23 ENCOUNTER — Ambulatory Visit (INDEPENDENT_AMBULATORY_CARE_PROVIDER_SITE_OTHER): Payer: PPO | Admitting: Ophthalmology

## 2019-08-23 DIAGNOSIS — H401490 Capsular glaucoma with pseudoexfoliation of lens, unspecified eye, stage unspecified: Secondary | ICD-10-CM | POA: Diagnosis not present

## 2019-08-23 DIAGNOSIS — I10 Essential (primary) hypertension: Secondary | ICD-10-CM

## 2019-08-23 DIAGNOSIS — H35033 Hypertensive retinopathy, bilateral: Secondary | ICD-10-CM | POA: Diagnosis not present

## 2019-08-23 DIAGNOSIS — H35351 Cystoid macular degeneration, right eye: Secondary | ICD-10-CM | POA: Diagnosis not present

## 2019-08-23 DIAGNOSIS — H3581 Retinal edema: Secondary | ICD-10-CM | POA: Diagnosis not present

## 2019-08-23 DIAGNOSIS — H40041 Steroid responder, right eye: Secondary | ICD-10-CM | POA: Diagnosis not present

## 2019-08-23 DIAGNOSIS — Z961 Presence of intraocular lens: Secondary | ICD-10-CM | POA: Diagnosis not present

## 2019-08-23 DIAGNOSIS — Z9889 Other specified postprocedural states: Secondary | ICD-10-CM

## 2019-08-23 MED ORDER — PROLENSA 0.07 % OP SOLN: 1.0000 [drp] | Freq: Four times a day (QID) | OPHTHALMIC | 3 refills | Status: DC

## 2019-08-24 MED ORDER — HYDROCHLOROTHIAZIDE 25 MG PO TABS: ORAL_TABLET | ORAL | 0 refills | Status: DC

## 2019-08-26 ENCOUNTER — Ambulatory Visit: Payer: PPO | Attending: Internal Medicine | Admitting: Physical Therapy

## 2019-08-26 ENCOUNTER — Other Ambulatory Visit: Payer: Self-pay

## 2019-08-26 ENCOUNTER — Encounter: Payer: Self-pay | Admitting: Physical Therapy

## 2019-08-26 DIAGNOSIS — R159 Full incontinence of feces: Secondary | ICD-10-CM | POA: Diagnosis not present

## 2019-08-26 DIAGNOSIS — R278 Other lack of coordination: Secondary | ICD-10-CM

## 2019-08-26 DIAGNOSIS — N3946 Mixed incontinence: Secondary | ICD-10-CM | POA: Diagnosis not present

## 2019-08-26 DIAGNOSIS — M6281 Muscle weakness (generalized): Secondary | ICD-10-CM | POA: Insufficient documentation

## 2019-08-26 NOTE — Patient Instructions (Addendum)
Certain foods and liquids will decrease the pH making the urine more acidic.  Urinary urgency increases when the urine has a low pH.  Most common irritants: alcohol, carbonated beverages and caffinated beverages.  Foods to avoid: apple juice, apples, ascorbic acid, canteloupes, chili, citrus fruits, coffee, cranberries, grapes, guava, peaches, pepper, pineapple, plums, strawberries, tea, tomatoes, and vinegar.  Drinking plenty of water may help to increase the pH and dilute out any of the effects of specific irritants.  Foods that are NOT irritating to the bladder include: Pears, papayas, sun-brewed teas, watermelons, non-citrus herbal teas, apricots, kava and low-acid instant drinks (Postum)      [email or web address]  Access Code: [email or web address]  URL: [email or web address]  Date: 08/26/2019  Prepared by: Earlie Counts   Exercises Supine Single Knee to Chest - 2 reps - 1 sets - 15 sec hold - 1x daily - 7x weekly Supine Figure 4 Piriformis Stretch - 3 reps - 1 sets - 15 sec hold - 1x daily - 7x weekly Prone Hip Extension with Bent Knee - 10 reps - 1 sets - 1x daily - 7x weekly Half Kneeling Hip Flexor Stretch - 2 reps - 1 sets - 15 sec hold - 1x daily - 7x weekly [facility] [address] [place], [place] [address] Phone # [phone] Fax [phone]

## 2019-08-26 NOTE — Therapy (Signed)
[facility] [address], [place], [place], [address] Phone: [phone]   Fax:  [phone]  Physical Therapy Treatment  Patient Details  Name: Jo Morrison MRN: [id number] Date of Birth: 07/22/1946 Referring Provider (PT): Dr. Silvano Rusk   Encounter Date: 08/26/2019  PT End of Session - 08/26/19 1102    Visit Number  2    Date for PT Re-Evaluation  10/12/19    Authorization Type  [employer]    PT Start Time  1100    PT Stop Time  1140    PT Time Calculation (min)  40 min    Activity Tolerance  Patient tolerated treatment well;No increased pain    Behavior During Therapy  WFL for tasks assessed/performed       Past Medical History:  Diagnosis Date  . Allergy   . Anxiety   . Asthma   . Breast cancer ([place]) 2012   left  . Breast cancer, right ([place]) 1996   right  . Cataract    cataracts removed  . Depressive disorder   . DVT (deep venous thrombosis) ([place])   . Glaucoma    not on eye drops at this time.  . Hypertension   . Olecranon bursitis   . Osteoarthritis   . Other disorders of bone and cartilage(733.99)   . Seasonal allergies   . Unspecified pruritic disorder     Past Surgical History:  Procedure Laterality Date  . APPENDECTOMY  1958  . BREAST LUMPECTOMY Right 1996   Dr. Corena Herter in [place]  . BREAST LUMPECTOMY Left 2011   Dr. Pleas Patricia  . cataract Bilateral   . CATARACT EXTRACTION Bilateral    Dr. Lucita Ferrara  . COLONOSCOPY  2011-last  . DUPUYTREN CONTRACTURE RELEASE    . LASIK Bilateral 2007  . POLYPECTOMY    . ROTATOR CUFF REPAIR  2007  . TONSILLECTOMY  1967    There were no vitals filed for this visit.  Subjective Assessment - 08/26/19 1103    Subjective  I have not discussed with my doctor about the lubricants. I have not had an issue with urinary leakage.    Patient Stated Goals  control the urination and diahrrhea    Currently in Pain?  No/denies                     Pelvic Floor Special Questions - 08/26/19 0001    Pelvic Floor Internal Exam  Patient confirms identification and approves PT to assess pelvic floor and muscles    Exam Type  Vaginal        [facility] Adult PT Treatment/Exercise - 08/26/19 0001      Self-Care   Self-Care  Other Self-Care Comments    Other Self-Care Comments   instructed patient on ordering a dilator to expand the dilator      Lumbar Exercises: Stretches   Single Knee to Chest Stretch  Left;2 reps;20 seconds    Hip Flexor Stretch  Left;3 reps;20 seconds   1/2 knee   Piriformis Stretch  Left;3 reps;20 seconds   supine     Knee/Hip Exercises: Prone   Hip Extension  Left;1 set;10 reps    Hip Extension Limitations  with knee bent      Manual Therapy   Manual Therapy  Joint mobilization;Internal Pelvic Floor    Joint Mobilization  using a mulligan belt to  mobilize the left hip for distraction, lateral distraction, and inferior glide grade 3; anterior glide  to left hip grade 3    Internal Pelvic Floor  perineal body, sides of introitus, 2.5 inches upward working on a band that is circular to open up the vagina             PT Education - 08/26/19 1144    Education Details  Access Code: [email or web address]; instruction on purchasing a dilator    Person(s) Educated  Patient    Methods  Explanation;Demonstration;Verbal cues;Handout    Comprehension  Returned demonstration;Verbalized understanding       PT Short Term Goals - 08/17/19 1327      PT SHORT TERM GOAL #1   Title  independent with intial HEP    Time  4    Period  Weeks    Status  New    Target Date  09/14/19      PT SHORT TERM GOAL #2   Title  urinary and fecal leakage decreased >/= 25% so she s down to 10 pads per month    Time  4    Period  Weeks    Status  New    Target Date  09/14/19      PT SHORT TERM GOAL #3   Title  understand correct toileting technique    Time  4    Period  Weeks    Status  New        PT  Long Term Goals - 08/17/19 1328      PT LONG TERM GOAL #1   Title  independent with advanced HEP    Time  8    Period  Weeks    Status  New    Target Date  10/12/19      PT LONG TERM GOAL #2   Title  urinary and fecal leakage decreased >/= 75% so patient is down to 1-2 pads per month    Time  8    Period  Weeks    Status  New    Target Date  10/12/19      PT LONG TERM GOAL #3   Title  able to feel the urge to urinate and urinate every 2 hours    Time  8    Period  Weeks    Status  New    Target Date  10/12/19      PT LONG TERM GOAL #4   Title  able to attempt intercourse with her husband due to elongation of the pelvic floor muscles    Time  8    Period  Weeks    Status  New    Target Date  10/12/19            Plan - 08/26/19 1106    Clinical Impression Statement  Patient was educated on pruchasing dilators to expand the vaginal canal. Patient has a band that is 2.5 inches into the vaginal canal making the diameter smaller. Patient reports no urinary or fecal leakage since last visit. Patient has not started the moisturizers yet but will look into it. Patient will benefit from skilled therapy to reduce urnary and fecal leakage by improving pelvic health and coordination.    Personal Factors and Comorbidities  Comorbidity 1;Sex;Age    Comorbidities  Breast cancer estrogen positive    Examination-Activity Limitations  Transfers;Squat;Continence;Toileting;Stand    Examination-Participation Restrictions  Community Activity;Interpersonal Relationship    Stability/Clinical Decision Making  Evolving/Moderate complexity    Rehab Potential  Excellent    PT Frequency  1x / week  PT Duration  8 weeks    PT Treatment/Interventions  Biofeedback;Cryotherapy;Electrical Stimulation;Moist Heat;Therapeutic activities;Therapeutic exercise;Patient/family education;Neuromuscular re-education;Manual techniques;Passive range of motion;Dry needling;Joint Manipulations    PT Next Visit Plan   work on left hip mobility, diaphgram mobility,  soft tissue work to the vagina, education on vaginal dilators, hip stretchs,  abdominal massage; toileting technique    PT Home Exercise Plan  Access Code: [email or web address] and Agree with Plan of Care  Patient       Patient will benefit from skilled therapeutic intervention in order to improve the following deficits and impairments:  Decreased coordination, Decreased range of motion, Increased fascial restricitons, Decreased endurance, Increased muscle spasms, Decreased activity tolerance, Impaired flexibility, Decreased mobility, Decreased strength  Visit Diagnosis: Muscle weakness (generalized)  Other lack of coordination  Mixed incontinence  Incontinence of feces, unspecified fecal incontinence type     Problem List Patient Active Problem List   Diagnosis Date Noted  . UTI symptoms 08/07/2019  . Chronic diarrhea 08/07/2019  . Rash 05/17/2019  . Low back pain 03/01/2019  . Routine general medical examination at a health care facility 08/16/2016  . Carpal tunnel syndrome, right 02/08/2016  . Ductal carcinoma in situ (DCIS) of left breast 10/28/2014  . Contracture of palmar fascia (Dupuytren's) 07/13/2014  . Hypertension   . Anxiety   . Depressive disorder   . Asthma   . Osteoarthritis   . Glaucoma   . Hx of adenomatous colonic polyps 11/01/2012  . Breast cancer of lower-outer quadrant of right female breast ([place]) 10/12/2012    Earlie Counts, PT 08/26/19 12:42 PM   [facility] [address] [place], [place], [address] Phone: [phone]   Fax:  [phone]  Name: Jo Morrison MRN: [id number] Date of Birth: 03/11/1946

## 2019-08-31 ENCOUNTER — Encounter: Payer: Self-pay | Admitting: Physical Therapy

## 2019-08-31 ENCOUNTER — Ambulatory Visit: Payer: PPO | Admitting: Physical Therapy

## 2019-08-31 ENCOUNTER — Other Ambulatory Visit: Payer: Self-pay

## 2019-08-31 DIAGNOSIS — R159 Full incontinence of feces: Secondary | ICD-10-CM

## 2019-08-31 DIAGNOSIS — M6281 Muscle weakness (generalized): Secondary | ICD-10-CM | POA: Diagnosis not present

## 2019-08-31 DIAGNOSIS — R278 Other lack of coordination: Secondary | ICD-10-CM

## 2019-08-31 DIAGNOSIS — N3946 Mixed incontinence: Secondary | ICD-10-CM

## 2019-08-31 NOTE — Therapy (Signed)
[facility] [address], [place], [place], [address] Phone: [phone]   Fax:  [phone]  Physical Therapy Treatment  Patient Details  Name: Jo Morrison MRN: [id number] Date of Birth: 1946/01/31 Referring Provider (PT): Dr. Silvano Rusk   Encounter Date: 08/31/2019  PT End of Session - 08/31/19 0935    Visit Number  3    Date for PT Re-Evaluation  10/12/19    Authorization Type  [employer]    PT Start Time  0930    PT Stop Time  1010    PT Time Calculation (min)  40 min    Activity Tolerance  Patient tolerated treatment well;No increased pain    Behavior During Therapy  WFL for tasks assessed/performed       Past Medical History:  Diagnosis Date  . Allergy   . Anxiety   . Asthma   . Breast cancer ([place]) 2012   left  . Breast cancer, right ([place]) 1996   right  . Cataract    cataracts removed  . Depressive disorder   . DVT (deep venous thrombosis) ([place])   . Glaucoma    not on eye drops at this time.  . Hypertension   . Olecranon bursitis   . Osteoarthritis   . Other disorders of bone and cartilage(733.99)   . Seasonal allergies   . Unspecified pruritic disorder     Past Surgical History:  Procedure Laterality Date  . APPENDECTOMY  1958  . BREAST LUMPECTOMY Right 1996   Dr. Corena Herter in [place]  . BREAST LUMPECTOMY Left 2011   Dr. Pleas Patricia  . cataract Bilateral   . CATARACT EXTRACTION Bilateral    Dr. Lucita Ferrara  . COLONOSCOPY  2011-last  . DUPUYTREN CONTRACTURE RELEASE    . LASIK Bilateral 2007  . POLYPECTOMY    . ROTATOR CUFF REPAIR  2007  . TONSILLECTOMY  1967    There were no vitals filed for this visit.  Subjective Assessment - 08/31/19 0936    Subjective  Working on hip mobility. I have not ordered the dilator yet but I have gotten the creams.    Patient Stated Goals  control the urination and diahrrhea    Currently in Pain?  No/denies         [facility] PT Assessment -  08/31/19 0001      PROM   Right Hip External Rotation   55    Left Hip Flexion  115    Left Hip External Rotation   52      Right Hip   Right Hip Flexion  120                Pelvic Floor Special Questions - 08/31/19 0001    Pelvic Floor Internal Exam  Patient confirms identification and approves PT to assess pelvic floor and muscles    Exam Type  Vaginal        [facility] Adult PT Treatment/Exercise - 08/31/19 0001      Therapeutic Activites    Therapeutic Activities  Other Therapeutic Activities    Other Therapeutic Activities  ocrrect toileting technique with breathing, knees above hips, and relax the pelvic floor      Neuro Re-ed    Neuro Re-ed Details   bulging of the pelvic floor, diaphragmatic breathing      Manual Therapy   Manual Therapy  Joint mobilization;Internal Pelvic Floor    Joint Mobilization  using a mulligan belt to  mobilize the  left hip for distraction, lateral distraction, and inferior glide grade 3; anterior glide to left hip grade 3    Internal Pelvic Floor  perineal body, sides of introitus, 2.5 inches upward working on a band that is circular to open up the vagina             PT Education - 08/31/19 1013    Education Details  toildting technique; bulging of the pelvic floor    Person(s) Educated  Patient    Methods  Explanation;Demonstration;Verbal cues;Handout    Comprehension  Returned demonstration;Verbalized understanding       PT Short Term Goals - 08/31/19 1017      PT SHORT TERM GOAL #1   Title  independent with intial HEP    Time  4    Period  Weeks    Status  On-going    Target Date  09/14/19      PT SHORT TERM GOAL #2   Title  urinary and fecal leakage decreased >/= 25% so she s down to 10 pads per month    Time  4    Period  Weeks    Status  On-going      PT SHORT TERM GOAL #3   Title  understand correct toileting technique    Time  4    Period  Weeks    Status  Achieved        PT Long Term Goals -  08/17/19 1328      PT LONG TERM GOAL #1   Title  independent with advanced HEP    Time  8    Period  Weeks    Status  New    Target Date  10/12/19      PT LONG TERM GOAL #2   Title  urinary and fecal leakage decreased >/= 75% so patient is down to 1-2 pads per month    Time  8    Period  Weeks    Status  New    Target Date  10/12/19      PT LONG TERM GOAL #3   Title  able to feel the urge to urinate and urinate every 2 hours    Time  8    Period  Weeks    Status  New    Target Date  10/12/19      PT LONG TERM GOAL #4   Title  able to attempt intercourse with her husband due to elongation of the pelvic floor muscles    Time  8    Period  Weeks    Status  New    Target Date  10/12/19            Plan - 08/31/19 0935    Clinical Impression Statement  Patient has not ordered the dilators yet. Patient has learned toileting technique. Patient has some difficulty with diaphragmatic breathing. Patient has increased in bilateral hip ER. Patient has improved hip flexion so she is able to sit on the commode with knees above hips to not strain. Patient has oredered her creams to work on vaginal moisture. Patient will benefti from skilled therapy to improve fecal and urinary leakage.    Personal Factors and Comorbidities  Comorbidity 1;Sex;Age    Comorbidities  Breast cancer estrogen positive    Examination-Activity Limitations  Transfers;Squat;Continence;Toileting;Stand    Examination-Participation Restrictions  Community Activity;Interpersonal Relationship    Stability/Clinical Decision Making  Evolving/Moderate complexity    Rehab Potential  Excellent  PT Frequency  1x / week    PT Duration  8 weeks    PT Treatment/Interventions  Biofeedback;Cryotherapy;Electrical Stimulation;Moist Heat;Therapeutic activities;Therapeutic exercise;Patient/family education;Neuromuscular re-education;Manual techniques;Passive range of motion;Dry needling;Joint Manipulations    PT Next Visit Plan   work on left hip mobility, diaphgram mobility,  soft tissue work to the vagina,  abdominal massage;    PT Home Exercise Plan  Access Code: [email or web address] and Agree with Plan of Care  Patient       Patient will benefit from skilled therapeutic intervention in order to improve the following deficits and impairments:  Decreased coordination, Decreased range of motion, Increased fascial restricitons, Decreased endurance, Increased muscle spasms, Decreased activity tolerance, Impaired flexibility, Decreased mobility, Decreased strength  Visit Diagnosis: Muscle weakness (generalized)  Other lack of coordination  Mixed incontinence  Incontinence of feces, unspecified fecal incontinence type     Problem List Patient Active Problem List   Diagnosis Date Noted  . UTI symptoms 08/07/2019  . Chronic diarrhea 08/07/2019  . Rash 05/17/2019  . Low back pain 03/01/2019  . Routine general medical examination at a health care facility 08/16/2016  . Carpal tunnel syndrome, right 02/08/2016  . Ductal carcinoma in situ (DCIS) of left breast 10/28/2014  . Contracture of palmar fascia (Dupuytren's) 07/13/2014  . Hypertension   . Anxiety   . Depressive disorder   . Asthma   . Osteoarthritis   . Glaucoma   . Hx of adenomatous colonic polyps 11/01/2012  . Breast cancer of lower-outer quadrant of right female breast ([place]) 10/12/2012    Earlie Counts, PT 08/31/19 10:19 AM   [facility] [address], [place], [place], [address] Phone: [phone]   Fax:  [phone]  Name: Jo Morrison MRN: [id number] Date of Birth: 09-Jun-1946

## 2019-08-31 NOTE — Patient Instructions (Addendum)
Toileting Techniques for Bowel Movements (Defecation) Using your belly (abdomen) and pelvic floor muscles to have a bowel movement is usually instinctive.  Sometimes people can have problems with these muscles and have to relearn proper defecation (emptying) techniques.  If you have weakness in your muscles, organs that are falling out, decreased sensation in your pelvis, or ignore your urge to go, you may find yourself straining to have a bowel movement.  You are straining if you are: . holding your breath or taking in a huge gulp of air and holding it  . keeping your lips and jaw tensed and closed tightly . turning red in the face because of excessive pushing or forcing . developing or worsening your  hemorrhoids . getting faint while pushing . not emptying completely and have to defecate many times a day  If you are straining, you are actually making it harder for yourself to have a bowel movement.  Many people find they are pulling up with the pelvic floor muscles and closing off instead of opening the anus. Due to lack pelvic floor relaxation and coordination the abdominal muscles, one has to work harder to push the feces out.  Many people have never been taught how to defecate efficiently and effectively.  Notice what happens to your body when you are having a bowel movement.  While you are sitting on the toilet pay attention to the following areas: . Jaw and mouth position . Angle of your hips   . Whether your feet touch the ground or not . Arm placement  . Spine position . Waist . Belly tension . Anus (opening of the anal canal)  An Evacuation/Defecation Plan   Here are the 4 basic points:  1. Lean forward enough for your elbows to rest on your knees 2. Support your feet on the floor or use a low stool if your feet don't touch the floor  3. Push out your belly as if you have swallowed a beach ball-you should feel a widening of your waist 4. Open and relax your pelvic floor muscles,  rather than tightening around the anus       The following conditions my require modifications to your toileting posture:  . If you have had surgery in the past that limits your back, hip, pelvic, knee or ankle flexibility . Constipation   Your healthcare practitioner may make the following additional suggestions and adjustments:  1) Sit on the toilet  a) Make sure your feet are supported. b) Notice your hip angle and spine position-most people find it effective to lean forward or raise their knees, which can help the muscles around the anus to relax  c) When you lean forward, place your forearms on your thighs for support  2) Relax suggestions a) Breath deeply in through your nose and out slowly through your mouth as if you are smelling the flowers and blowing out the candles. b) To become aware of how to relax your muscles, contracting and releasing muscles can be helpful.  Pull your pelvic floor muscles in tightly by using the image of holding back gas, or closing around the anus (visualize making a circle smaller) and lifting the anus up and in.  Then release the muscles and your anus should drop down and feel open. Repeat 5 times ending with the feeling of relaxation. c) Keep your pelvic floor muscles relaxed; let your belly bulge out. d) The digestive tract starts at the mouth and ends at the anal opening, so   be sure to relax both ends of the tube.  Place your tongue on the roof of your mouth with your teeth separated.  This helps relax your mouth and will help to relax the anus at the same time.  3) Empty (defecation) a) Keep your pelvic floor and sphincter relaxed, then bulge your anal muscles.  Make the anal opening wide.  b) Stick your belly out as if you have swallowed a beach ball. c) Make your belly wall hard using your belly muscles while continuing to breathe. Doing this makes it easier to open your anus. d) Breath out and give a grunt (or try using other sounds such as  ahhhh, shhhhh, ohhhh or grrrrrrr).  4) Finish a) As you finish your bowel movement, pull the pelvic floor muscles up and in.  This will leave your anus in the proper place rather than remaining pushed out and down. If you leave your anus pushed out and down, it will start to feel as though that is normal and give you incorrect signals about needing to have a bowel movement.  Bear Down    Exhaling, bear down as if to have a bowel movement. Repeat _5__ times. Do __1_ times a day.  Copyright  VHI. All rights reserved.  [facility] [address] [place], [place] [address] Phone # [phone] Fax [phone]

## 2019-09-08 ENCOUNTER — Encounter: Payer: Self-pay | Admitting: Physical Therapy

## 2019-09-08 ENCOUNTER — Ambulatory Visit: Payer: PPO | Admitting: Physical Therapy

## 2019-09-08 ENCOUNTER — Other Ambulatory Visit: Payer: Self-pay

## 2019-09-08 DIAGNOSIS — N3946 Mixed incontinence: Secondary | ICD-10-CM

## 2019-09-08 DIAGNOSIS — R159 Full incontinence of feces: Secondary | ICD-10-CM

## 2019-09-08 DIAGNOSIS — R278 Other lack of coordination: Secondary | ICD-10-CM

## 2019-09-08 DIAGNOSIS — M6281 Muscle weakness (generalized): Secondary | ICD-10-CM | POA: Diagnosis not present

## 2019-09-08 NOTE — Therapy (Signed)
[facility] [address] [place], [place], [address] Phone: [phone]   Fax:  [phone]  Physical Therapy Treatment  Patient Details  Name: Jo Morrison MRN: [id number] Date of Birth: [birth year] Referring Provider (PT): Dr. Silvano Rusk   Encounter Date: 09/08/2019  PT End of Session - 09/08/19 1323    Visit Number  4    Date for PT Re-Evaluation  10/12/19    Authorization Type  [employer]    PT Start Time  [id number]    PT Stop Time  1225    PT Time Calculation (min)  40 min    Activity Tolerance  Patient tolerated treatment well;No increased pain    Behavior During Therapy  WFL for tasks assessed/performed       Past Medical History:  Diagnosis Date  . Allergy   . Anxiety   . Asthma   . Breast cancer ([place]) 2012   left  . Breast cancer, right ([place]) 1996   right  . Cataract    cataracts removed  . Depressive disorder   . DVT (deep venous thrombosis) ([place])   . Glaucoma    not on eye drops at this time.  . Hypertension   . Olecranon bursitis   . Osteoarthritis   . Other disorders of bone and cartilage(733.99)   . Seasonal allergies   . Unspecified pruritic disorder     Past Surgical History:  Procedure Laterality Date  . APPENDECTOMY  1958  . BREAST LUMPECTOMY Right 1996   Dr. Corena Herter in [place]  . BREAST LUMPECTOMY Left 2011   Dr. Pleas Patricia  . cataract Bilateral   . CATARACT EXTRACTION Bilateral    Dr. Lucita Ferrara  . COLONOSCOPY  2011-last  . DUPUYTREN CONTRACTURE RELEASE    . LASIK Bilateral 2007  . POLYPECTOMY    . ROTATOR CUFF REPAIR  2007  . TONSILLECTOMY  1967    There were no vitals filed for this visit.  Subjective Assessment - 09/08/19 1148    Subjective  I ordered the dilators. I have been using the creams on the vaginal area and they are helping. Urinary leakage is doing well. I was able to delay the the need to go to the bathroom to finish the dishes. Patient has had no  issues of diarrhea and made it to the bathroom when Ihad one small bout.    Patient Stated Goals  control the urination and diahrrhea    Currently in Pain?  No/denies                    Pelvic Floor Special Questions - 09/08/19 0001    Pelvic Floor Internal Exam  Patient confirms identification and approves PT to assess pelvic floor and muscles    Exam Type  Vaginal        [facility] Adult PT Treatment/Exercise - 09/08/19 0001      Exercises   Exercises  Other Exercises    Other Exercises   diaphragmatic breathing to bulge the abdomen and then bulge the pelvic floor for elongation of tissue      Manual Therapy   Manual Therapy  Myofascial release;Soft tissue mobilization;Internal Pelvic Floor    Manual therapy comments  mobilization of the diaphgram in sitting with trunk flexion and rotation    Soft tissue mobilization  circular massage to stimulate bowel movement and peristalic motion of the intestines, diaphragm    Myofascial Release  along th upper midline of the abdomen,  along the sides of the abdomen to assist in expanding the stomach to move the diaphgram and pelvic floor    Internal Pelvic Floor  perineal body, sides of introitus, 2.5 inches upward working on a band that is circular to open up the vagina             PT Education - 09/08/19 1323    Education Details  bulging of the pelvic floor    Person(s) Educated  Patient    Methods  Explanation;Demonstration;Verbal cues;Handout    Comprehension  Verbalized understanding;Returned demonstration       PT Short Term Goals - 09/08/19 1328      PT SHORT TERM GOAL #1   Title  independent with intial HEP    Time  4    Period  Weeks    Status  Achieved      PT SHORT TERM GOAL #2   Title  urinary and fecal leakage decreased >/= 25% so she s down to 10 pads per month    Time  4    Period  Weeks    Status  On-going        PT Long Term Goals - 08/17/19 1328      PT LONG TERM GOAL #1   Title   independent with advanced HEP    Time  8    Period  Weeks    Status  New    Target Date  10/12/19      PT LONG TERM GOAL #2   Title  urinary and fecal leakage decreased >/= 75% so patient is down to 1-2 pads per month    Time  8    Period  Weeks    Status  New    Target Date  10/12/19      PT LONG TERM GOAL #3   Title  able to feel the urge to urinate and urinate every 2 hours    Time  8    Period  Weeks    Status  New    Target Date  10/12/19      PT LONG TERM GOAL #4   Title  able to attempt intercourse with her husband due to elongation of the pelvic floor muscles    Time  8    Period  Weeks    Status  New    Target Date  10/12/19            Plan - 09/08/19 1324    Clinical Impression Statement  Patient has ordered the dilator. Patient is using the creams for vaginal dryness. Patient pelvic floor strength is 2/5. Patient was able to perform diaphragmatic breathing with greater ease after manual work. Patient has a very tight diaphgram. Patient was able to perform urge to void technique and delay the urge to urinate to finish the dishes but had minimal leakage. Patient only had 1 bout of diarrhea. Patient had good abdominal sounds with soft tissue work. Patient will benefit from skilled therapy to improve fecal and urinary leakage.    Comorbidities  Breast cancer estrogen positive    Examination-Activity Limitations  Transfers;Squat;Continence;Toileting;Stand    Examination-Participation Restrictions  Community Activity;Interpersonal Relationship    Stability/Clinical Decision Making  Evolving/Moderate complexity    Rehab Potential  Excellent    PT Frequency  1x / week    PT Duration  8 weeks    PT Treatment/Interventions  Biofeedback;Cryotherapy;Electrical Stimulation;Moist Heat;Therapeutic activities;Therapeutic exercise;Patient/family education;Neuromuscular re-education;Manual techniques;Passive range of motion;Dry needling;Joint Manipulations  PT Next Visit Plan   work on left hip mobility, diaphgram mobility,  soft tissue work to the vagina,  abdominal massage;    PT Home Exercise Plan  Access Code: [email or web address]    Recommended Other Services  MD signed initial eval    Consulted and Agree with Plan of Care  Patient       Patient will benefit from skilled therapeutic intervention in order to improve the following deficits and impairments:     Visit Diagnosis: Muscle weakness (generalized)  Other lack of coordination  Mixed incontinence  Incontinence of feces, unspecified fecal incontinence type     Problem List Patient Active Problem List   Diagnosis Date Noted  . UTI symptoms 08/07/2019  . Chronic diarrhea 08/07/2019  . Rash 05/17/2019  . Low back pain 03/01/2019  . Routine general medical examination at a health care facility 08/16/2016  . Carpal tunnel syndrome, right 02/08/2016  . Ductal carcinoma in situ (DCIS) of left breast 10/28/2014  . Contracture of palmar fascia (Dupuytren's) 07/13/2014  . Hypertension   . Anxiety   . Depressive disorder   . Asthma   . Osteoarthritis   . Glaucoma   . Hx of adenomatous colonic polyps 11/01/2012  . Breast cancer of lower-outer quadrant of right female breast ([place]) 10/12/2012    Earlie Counts, PT 09/08/19 1:29 PM   [facility] [address], [place], [place], [address] Phone: [phone]   Fax:  [phone]  Name: Jo Morrison MRN: [id number] Date of Birth: [birth year]

## 2019-09-08 NOTE — Patient Instructions (Addendum)
Certain foods and liquids will decrease the pH making the urine more acidic.  Urinary urgency increases when the urine has a low pH.  Most common irritants: alcohol, carbonated beverages and caffinated beverages.  Foods to avoid: apple juice, apples, ascorbic acid, canteloupes, chili, citrus fruits, coffee, cranberries, grapes, guava, peaches, pepper, pineapple, plums, strawberries, tea, tomatoes, and vinegar.  Drinking plenty of water may help to increase the pH and dilute out any of the effects of specific irritants.  Foods that are NOT irritating to the bladder include: Pears, papayas, sun-brewed teas, watermelons, non-citrus herbal teas, apricots, kava and low-acid instant drinks (Postum)  About Abdominal Massage  Abdominal massage, also called external colon massage, is a self-treatment circular massage technique that can reduce and eliminate gas and ease constipation. The colon naturally contracts in waves in a clockwise direction starting from inside the right hip, moving up toward the ribs, across the belly, and down inside the left hip.  When you perform circular abdominal massage, you help stimulate your colon's normal wave pattern of movement called peristalsis.  It is most beneficial when done after eating.  Positioning You can practice abdominal massage with oil while lying down, or in the shower with soap.  Some people find that it is just as effective to do the massage through clothing while sitting or standing.  How to Massage Start by placing your finger tips or knuckles on your right side, just inside your hip bone.  . Make small circular movements while you move upward toward your rib cage.   . Once you reach the bottom right side of your rib cage, take your circular movements across to the left side of the bottom of your rib cage.  . Next, move downward until you reach the inside of your left hip bone.  This is the path your feces travel in your colon. . Continue to perform  your abdominal massage in this pattern for 10 minutes each day.     You can apply as much pressure as is comfortable in your massage.  Start gently and build pressure as you continue to practice.  Notice any areas of pain as you massage; areas of slight pain may be relieved as you massage, but if you have areas of significant or intense pain, consult with your healthcare provider.  Other Considerations . General physical activity including bending and stretching can have a beneficial massage-like effect on the colon.  Deep breathing can also stimulate the colon because breathing deeply activates the same nervous system that supplies the colon.   . Abdominal massage should always be used in combination with a bowel-conscious diet that is high in the proper type of fiber for you, fluids (primarily water), and a regular exercise program.  [facility] [address], [place], [place] [address] Phone # [phone] Fax [phone]    Rodman Pickle Down   Inhale  bear down as if to have a bowel movement. Bulge the pelvic floor Repeat _5__ times. Do _2__ times a day.  Copyright  VHI. All rights reserved.

## 2019-09-11 ENCOUNTER — Other Ambulatory Visit: Payer: Self-pay | Admitting: Internal Medicine

## 2019-09-13 ENCOUNTER — Encounter: Payer: Self-pay | Admitting: Physical Therapy

## 2019-09-13 ENCOUNTER — Ambulatory Visit: Payer: PPO | Admitting: Physical Therapy

## 2019-09-13 ENCOUNTER — Other Ambulatory Visit: Payer: Self-pay

## 2019-09-13 DIAGNOSIS — M6281 Muscle weakness (generalized): Secondary | ICD-10-CM

## 2019-09-13 DIAGNOSIS — R278 Other lack of coordination: Secondary | ICD-10-CM

## 2019-09-13 DIAGNOSIS — R159 Full incontinence of feces: Secondary | ICD-10-CM

## 2019-09-13 DIAGNOSIS — N3946 Mixed incontinence: Secondary | ICD-10-CM

## 2019-09-13 NOTE — Therapy (Signed)
[facility] [address] [place], [place], [address] Phone: [phone]   Fax:  [phone]  Physical Therapy Treatment  Patient Details  Name: Jo Morrison MRN: [id number] Date of Birth: [birth year] Referring Provider (PT): Dr. Silvano Rusk   Encounter Date: 09/13/2019  PT End of Session - 09/13/19 1226    Visit Number  5    Date for PT Re-Evaluation  10/12/19    Authorization Type  [employer]    PT Start Time  1145    PT Stop Time  1225    PT Time Calculation (min)  40 min    Activity Tolerance  Patient tolerated treatment well;No increased pain    Behavior During Therapy  WFL for tasks assessed/performed       Past Medical History:  Diagnosis Date  . Allergy   . Anxiety   . Asthma   . Breast cancer ([place]) 2012   left  . Breast cancer, right ([place]) 1996   right  . Cataract    cataracts removed  . Depressive disorder   . DVT (deep venous thrombosis) ([place])   . Glaucoma    not on eye drops at this time.  . Hypertension   . Olecranon bursitis   . Osteoarthritis   . Other disorders of bone and cartilage(733.99)   . Seasonal allergies   . Unspecified pruritic disorder     Past Surgical History:  Procedure Laterality Date  . APPENDECTOMY  1958  . BREAST LUMPECTOMY Right 1996   Dr. Corena Herter in [place]  . BREAST LUMPECTOMY Left 2011   Dr. Pleas Patricia  . cataract Bilateral   . CATARACT EXTRACTION Bilateral    Dr. Lucita Ferrara  . COLONOSCOPY  2011-last  . DUPUYTREN CONTRACTURE RELEASE    . LASIK Bilateral 2007  . POLYPECTOMY    . ROTATOR CUFF REPAIR  2007  . TONSILLECTOMY  1967    There were no vitals filed for this visit.  Subjective Assessment - 09/13/19 1151    Subjective  I have the dilators. Bathrooming is doing alright. My hips are feeling loser.    Patient Stated Goals  control the urination and diahrrhea    Currently in Pain?  No/denies    Multiple Pain Sites  No                     Pelvic Floor Special Questions - 09/13/19 0001    Pelvic Floor Internal Exam  Patient confirms identification and approves PT to assess pelvic floor and muscles    Exam Type  Vaginal    Palpation  tightness in the top of the vaginal canal    Strength  fair squeeze, definite lift        [facility] Adult PT Treatment/Exercise - 09/13/19 0001      Self-Care   Self-Care  Other Self-Care Comments    Other Self-Care Comments   instruction on using the vaginal dilator, how to progress herself, gave her lubricant she is able to use, patient able to return demonstration, educated patient on how to work with foreplay with her husband due to hime having prostate cancer in the past and may have trouble with erections      Lumbar Exercises: Aerobic   Stationary Bike  5 minutes level 2 while assessing patient      Manual Therapy   Manual Therapy  Internal Pelvic Floor    Internal Pelvic Floor  perineal body, sides of introitus, 2.5 inches  upward working on a band that is circular to open up the vagina             PT Education - 09/13/19 1226    Education Details  education on using the vaginal dilator    Person(s) Educated  Patient    Methods  Explanation;Demonstration;Handout    Comprehension  Returned demonstration;Verbalized understanding       PT Short Term Goals - 09/13/19 1229      PT SHORT TERM GOAL #2   Title  urinary and fecal leakage decreased >/= 25% so she s down to 10 pads per month    Time  4    Period  Weeks    Status  Achieved    Target Date  09/14/19        PT Long Term Goals - 09/13/19 1229      PT LONG TERM GOAL #1   Title  independent with advanced HEP    Time  8    Period  Weeks    Status  On-going      PT LONG TERM GOAL #2   Title  urinary and fecal leakage decreased >/= 75% so patient is down to 1-2 pads per month    Time  8    Period  Weeks    Status  On-going      PT LONG TERM GOAL #3   Title  able to feel the urge  to urinate and urinate every 2 hours    Time  8    Period  Weeks    Status  On-going      PT LONG TERM GOAL #4   Title  able to attempt intercourse with her husband due to elongation of the pelvic floor muscles    Time  8    Period  Weeks    Status  On-going            Plan - 09/13/19 1148    Clinical Impression Statement  Patient is able to demonstrate how to use the dilator correctly and holding it in for 5 mintues and the correct postion to be in. Discussed with patient on how her and her husband can work on foreplay now to have the tissue relax. Patient has less tightness in the vaginal canal. Patient is not having issues with constipation and urinary leakage. Patient will benefit from skilled therapy to improve fecal and urinary leakage while expanding the vagina canal.    Personal Factors and Comorbidities  Comorbidity 1;Sex;Age    Comorbidities  Breast cancer estrogen positive    Examination-Activity Limitations  Transfers;Squat;Continence;Toileting;Stand    Examination-Participation Restrictions  Community Activity;Interpersonal Relationship    Stability/Clinical Decision Making  Evolving/Moderate complexity    Rehab Potential  Excellent    PT Frequency  1x / week    PT Duration  8 weeks    PT Treatment/Interventions  Biofeedback;Cryotherapy;Electrical Stimulation;Moist Heat;Therapeutic activities;Therapeutic exercise;Patient/family education;Neuromuscular re-education;Manual techniques;Passive range of motion;Dry needling;Joint Manipulations    PT Next Visit Plan  work on left hip mobility, diaphgram mobility,  soft tissue work to the vagina,  abdominal massage;    PT Home Exercise Plan  Access Code: [email or web address] and Agree with Plan of Care  Patient       Patient will benefit from skilled therapeutic intervention in order to improve the following deficits and impairments:  Decreased coordination, Decreased range of motion, Increased fascial restricitons, Decreased  endurance, Increased muscle spasms, Decreased activity tolerance, Impaired flexibility,  Decreased mobility, Decreased strength  Visit Diagnosis: Muscle weakness (generalized)  Other lack of coordination  Mixed incontinence  Incontinence of feces, unspecified fecal incontinence type     Problem List Patient Active Problem List   Diagnosis Date Noted  . UTI symptoms 08/07/2019  . Chronic diarrhea 08/07/2019  . Rash 05/17/2019  . Low back pain 03/01/2019  . Routine general medical examination at a health care facility 08/16/2016  . Carpal tunnel syndrome, right 02/08/2016  . Ductal carcinoma in situ (DCIS) of left breast 10/28/2014  . Contracture of palmar fascia (Dupuytren's) 07/13/2014  . Hypertension   . Anxiety   . Depressive disorder   . Asthma   . Osteoarthritis   . Glaucoma   . Hx of adenomatous colonic polyps 11/01/2012  . Breast cancer of lower-outer quadrant of right female breast [facility]) 10/12/2012    Earlie Counts, PT 09/13/19 12:31 PM   [facility] [address], [place], [place], [address] Phone: [phone]   Fax:  [phone]  Name: Taide Tafur MRN: [id number] Date of Birth: September 29, 1946

## 2019-09-13 NOTE — Patient Instructions (Signed)
PROTOCOL FOR DILATORS   1. Wash dilator with soap and water prior to insertion.    2. Lay on your back reclined. Knees are to be up and apart while on your bed or in the bathtub with warm water.   3. Lubricate the end of the dilator with a water-soluble lubricant.  4. Separate the labia.   5. Tense the pelvic floor muscles than relax; while relaxing, slide lubricated dilator into the vagina.    6. Tense muscles again while holding the dilator so it does not get pushed out; relax and slide it in a little further.   7. Try blowing out as if filling a balloon; this may relax the muscles and allow penetration.  Repeat blowing out to insert dilator further.  8. Keep dilator in for 10 minutes if tolerate, with the pelvic floor muscles relaxed to further stretch the canal.   9. Never force the dilator into the canal.  10. 1-2 times per day  [facility] [address], [place], [place] [address] Phone # [phone] Fax [phone]

## 2019-09-14 NOTE — Progress Notes (Signed)
[facility] Note  09/15/2019     CHIEF COMPLAINT Patient presents for Retina Follow Up   HISTORY OF PRESENT ILLNESS: Jo Morrison is a 73 y.o. female who presents to the clinic today for:   HPI    Retina Follow Up    Diagnosis: CME.  In right eye.  Severity is moderate.  Duration of 3 weeks.  Since onset it is gradually improving.  I, the attending physician,  performed the HPI with the patient and updated documentation appropriately.          Comments    Patient states vision the same OD. Pain and light sensitivity improved OD--much better per patient.Taking cosopt tid OD, inveltys and prolensa qid OD.       Last edited by Bernarda Caffey, MD on 09/15/2019  3:46 PM. (History)    pt states her light sensitivity has gotten better, pt states she has been using the drops as directed and not missed any doses  Referring physician: Hortencia Pilar, MD [place],  [place] [address]  HISTORICAL INFORMATION:   Selected notes from the MEDICAL RECORD NUMBER Referred by Dr. Quentin Ore for concern of uveitis OD LEE: 09.23.20 Read D[employer]) [BCVA: OD: 20/60-- OS: 20/30] Ocular Hx-pseudoexfloiation glaucoma (s/p SLT OU), drusen OU, DES, pseudo OU (2016), lasik (1996) PMH-HTN, cancer, depression   CURRENT MEDICATIONS: Current Outpatient Medications (Ophthalmic Drugs)  Medication Sig  . Bromfenac Sodium (PROLENSA) 0.07 % SOLN Place 1 drop into the right eye 4 (four) times daily.  . dorzolamide-timolol (COSOPT) 22.3-6.8 MG/ML ophthalmic solution Place 1 drop into the right eye 3 (three) times daily.  . prednisoLONE acetate (PRED FORTE) 1 % ophthalmic suspension Place 1 drop into the right eye as directed.   No current facility-administered medications for this visit.  (Ophthalmic Drugs)   Current Outpatient Medications (Other)  Medication Sig  . Albuterol Sulfate (PROAIR RESPICLICK) 123XX123 (90 BASE) MCG/ACT AEPB Inhale 2 puffs into the  lungs every 4 (four) hours as needed (SOB).  . budesonide (PULMICORT) 180 MCG/ACT inhaler Inhale 2 puffs into the lungs 2 (two) times daily.  . clonazePAM (KLONOPIN) 0.5 MG tablet Take 1 tablet (0.5 mg total) by mouth at bedtime as needed.  . fluticasone (FLONASE) 50 MCG/ACT nasal spray Place into the nose.  . gabapentin (NEURONTIN) 100 MG capsule Take 100 mg by mouth at bedtime as needed. Patient takes 100 mg at night  . hydrochlorothiazide (HYDRODIURIL) 25 MG tablet TAKE 1 TABLET(25 MG) BY MOUTH DAILY  . methocarbamol (ROBAXIN) 500 MG tablet Take 1 tablet (500 mg total) by mouth every 8 (eight) hours as needed for muscle spasms.  . montelukast (SINGULAIR) 10 MG tablet TAKE 1 TABLET(10 MG) BY MOUTH AT BEDTIME  . traZODone (DESYREL) 100 MG tablet Take 1-2 tablets (100-200 mg total) by mouth at bedtime as needed for sleep.  . traZODone (DESYREL) 150 MG tablet Take 150 mg by mouth at bedtime.  . triamcinolone cream (KENALOG) 0.1 % Apply 1 application topically 2 (two) times daily.  Marland Kitchen venlafaxine XR (EFFEXOR-XR) 150 MG 24 hr capsule Take 1 capsule (150 mg total) by mouth daily. Take one tablet by mouth once daily   No current facility-administered medications for this visit.  (Other)      REVIEW OF SYSTEMS: ROS    Positive for: Cardiovascular, Eyes, Respiratory   Negative for: Constitutional, Gastrointestinal, Neurological, Skin, Genitourinary, Musculoskeletal, HENT, Endocrine, Psychiatric, Allergic/Imm, Heme/Lymph   Last edited by Stephanie Coup,  Daryl D, COT on 09/15/2019  2:52 PM. (History)       ALLERGIES Allergies  Allergen Reactions  . Latex Shortness Of Breath    PAST MEDICAL HISTORY Past Medical History:  Diagnosis Date  . Allergy   . Anxiety   . Asthma   . Breast cancer ([place]) 2012   left  . Breast cancer, right ([place]) 1996   right  . Cataract    cataracts removed  . Depressive disorder   . DVT (deep venous thrombosis) ([place])   . Glaucoma    not on eye drops at this time.   . Hypertension   . Olecranon bursitis   . Osteoarthritis   . Other disorders of bone and cartilage(733.99)   . Seasonal allergies   . Unspecified pruritic disorder    Past Surgical History:  Procedure Laterality Date  . APPENDECTOMY  1958  . BREAST LUMPECTOMY Right 1996   Dr. Corena Herter in [place]  . BREAST LUMPECTOMY Left 2011   Dr. Pleas Patricia  . cataract Bilateral   . CATARACT EXTRACTION Bilateral    Dr. Lucita Ferrara  . COLONOSCOPY  2011-last  . DUPUYTREN CONTRACTURE RELEASE    . LASIK Bilateral 2007  . POLYPECTOMY    . ROTATOR CUFF REPAIR  2007  . TONSILLECTOMY  1967    FAMILY HISTORY Family History  Problem Relation Age of Onset  . Stroke Father 25  . Heart disease Brother   . Colon cancer Maternal Aunt 55  . Macular degeneration Mother   . Colon polyps Neg Hx   . Esophageal cancer Neg Hx   . Rectal cancer Neg Hx   . Stomach cancer Neg Hx     SOCIAL HISTORY Social History   Tobacco Use  . Smoking status: Never Smoker  . Smokeless tobacco: Never Used  Substance Use Topics  . Alcohol use: Yes    Alcohol/week: 25.0 standard drinks    Types: 25 Glasses of wine per week  . Drug use: No         OPHTHALMIC EXAM:  Base Eye Exam    Visual Acuity (Snellen - Linear)      Right Left   Dist cc 20/80 -2 20/40 -2   Dist ph cc NI 20/40   Correction: Glasses       Tonometry (Tonopen, 3:08 PM)      Right Left   Pressure 19 14       Pupils      Dark Light Shape React APD   Right 4 3.5 Round Minimal None   Left 3 2 Round Brisk None       Visual Fields (Counting fingers)      Left Right    Full Full       Extraocular Movement      Right Left    Full, Ortho Full, Ortho       Neuro/Psych    Oriented x3: Yes   Mood/Affect: Normal       Dilation    Both eyes: 1.0% Mydriacyl, 2.5% Phenylephrine @ 3:08 PM        Slit Lamp and Fundus Exam    Slit Lamp Exam      Right Left   Lids/Lashes Dermatochalasis - upper lid, Meibomian gland dysfunction  Dermatochalasis - upper lid, Meibomian gland dysfunction   Conjunctiva/Sclera White and quiet White and quiet   Cornea Mild Arcus, well healed lasik flap, Well healed temporal cataract wounds, 3+ Punctate epithelial erosions Mild Arcus, well healed  lasik flap, Well healed temporal cataract wounds, 3+ Punctate epithelial erosions   Anterior Chamber Deep and quiet, no cell or flare Deep and quiet, no cell or flare   Iris Round and moderately dilated to 50mm, mild pseudo exfoliative material on pupil margin Round and moderately dilated to 5.31mm   Lens PC IOL in good position PC IOL in good position, 1+ superior Posterior capsular opacification   Vitreous Vitreous syneresis, no vit cells Vitreous syneresis       Fundus Exam      Right Left   Disc Pink and Sharp, temporal PPP Pink and Sharp, temporal PPA   C/D Ratio 0.6 0.6   Macula Flat, Blunted foveal reflex, interval increase in IRF/SRF centrally Flat, good foveal reflex, mild Retinal pigment epithelial mottling, No heme or edema   Vessels Vascular attenuation, Tortuous, no sheathing or vasculitis Vascular attenuation, Tortuous, no sheathing or vasculitis   Periphery Attached, pigmented CR scar at 0400 equator, reticular degeneration Attached, reticular degeneration        Refraction    Wearing Rx      Sphere Cylinder Axis   Right -1.75 +1.00 007   Left -2.50 +1.00 157       Manifest Refraction      Sphere Cylinder Axis Dist VA   Right -1.25 +0.75 005 20/60-1   Left -2.50 +1.00 165 20/20          IMAGING AND PROCEDURES  Imaging and Procedures for @TODAY @  OCT, Retina - OU - Both Eyes       Right Eye Quality was good. Central Foveal Thickness: 496. Progression has worsened. Findings include abnormal foveal contour, intraretinal fluid, cystoid macular edema, subretinal fluid (Interval increase in IRF and interval development of SRF).   Left Eye Quality was good. Central Foveal Thickness: 245. Progression has been stable.  Findings include normal foveal contour, no IRF, no SRF.   Notes *Images captured and stored on drive  Diagnosis / Impression:  OD: central cystic changes / CME worse - Interval increase in IRF and interval development of SRF OS: NFP, no IRF/SRF  Clinical management:  See below  Abbreviations: NFP - Normal foveal profile. CME - cystoid macular edema. PED - pigment epithelial detachment. IRF - intraretinal fluid. SRF - subretinal fluid. EZ - ellipsoid zone. ERM - epiretinal membrane. ORA - outer retinal atrophy. ORT - outer retinal tubulation. SRHM - subretinal hyper-reflective material                 ASSESSMENT/PLAN:    ICD-10-CM   1. CME (cystoid macular edema), right  H35.351   2. Retinal edema  H35.81 OCT, Retina - OU - Both Eyes  3. Pseudoexfoliation (PXF) glaucoma, unspecified glaucoma stage  H40.1490   4. Steroid responder, right eye  H40.041   5. Essential hypertension  I10   6. Hypertensive retinopathy of both eyes  H35.033   7. Pseudophakia of both eyes  Z96.1   8. Hx of LASIK  Z98.890     1,2. CME OD  - history of recurrent iritis OD, complicated by steroid response w/ tMax 32 OD  - FA 9.28.20 shows petaloid hyperfluorescent leakage centrally + hyperfluorescence of disc consistent with post-op CME / [place]-Gass syndrome  - discussed findings, prognosis and treatment options  - on initial 9.28.20 visit, increased Inveltys to QID and started Prolensa QID OD  - today, BCVA improved to 20/80 and pt reports subjective improvement in visoin  - interestingly, OCT shows interval worsening of IRF,  SRF, and CME  - discussed possibility STK OD for increased CME on OCT  - pt wishes to hold off for now and f/u in 3 wks -- reasonable given improved BCVA  - discussed risk of worsening vision -- pt verbalized understanding  - cont Inveltys to QID OD  - cont Prolensa QID OD   - f/u 3-4 weeks -- if CME persists/worsens, will consider STK OD  3,4. History of pseudoexfoliation  glaucoma and steroid response OD  - under the expert management of Dr. Kathlen Mody  - s/p SLT x2  - tmax 32 OD while on Pred-Forte  - IOP 19 today  - continue PF Cosopt TID OD  5,6. Hypertensive retinopathy OU  - discussed importance of tight BP control  - monitor  7. Pseudophakia OU  - s/p CE/IOL (Dr. Astrid Drafts)  - IOLs in good position  - monitor  8. History of Lasik  - 23 years ago in [place] Ordered this visit:  No orders of the defined types were placed in this encounter.      Return for f/u 3-4 weeks, CME OD, DFE, OCT.  There are no Patient Instructions on file for this visit.   Explained the diagnoses, plan, and follow up with the patient and they expressed understanding.  Patient expressed understanding of the importance of proper follow up care.   This document serves as a record of services personally performed by Gardiner Sleeper, MD, PhD. It was created on their behalf by Ernest Mallick, OA, an ophthalmic assistant. The creation of this record is the provider's dictation and/or activities during the visit.    Electronically signed by: Ernest Mallick, OA 10.20.2020 8:31 PM    Gardiner Sleeper, M.D., Ph.D. Diseases & Surgery of the Retina and Vitreous [facility]  I have reviewed the above documentation for accuracy and completeness, and I agree with the above. Gardiner Sleeper, M.D., Ph.D. 09/15/19 8:31 PM    Abbreviations: M myopia (nearsighted); A astigmatism; H hyperopia (farsighted); P presbyopia; Mrx spectacle prescription;  CTL contact lenses; OD right eye; OS left eye; OU both eyes  XT exotropia; ET esotropia; PEK punctate epithelial keratitis; PEE punctate epithelial erosions; DES dry eye syndrome; MGD meibomian gland dysfunction; ATs artificial tears; PFAT's preservative free artificial tears; [place] nuclear sclerotic cataract; PSC posterior subcapsular cataract; ERM epi-retinal membrane; PVD posterior vitreous  detachment; RD retinal detachment; DM diabetes mellitus; DR diabetic retinopathy; NPDR non-proliferative diabetic retinopathy; PDR proliferative diabetic retinopathy; CSME clinically significant macular edema; DME diabetic macular edema; dbh dot blot hemorrhages; CWS cotton wool spot; POAG primary open angle glaucoma; C/D cup-to-disc ratio; HVF humphrey visual field; GVF goldmann visual field; OCT optical coherence tomography; IOP intraocular pressure; BRVO Branch retinal vein occlusion; CRVO central retinal vein occlusion; CRAO central retinal artery occlusion; BRAO branch retinal artery occlusion; RT retinal tear; SB scleral buckle; PPV pars plana vitrectomy; VH Vitreous hemorrhage; PRP panretinal laser photocoagulation; IVK intravitreal kenalog; VMT vitreomacular traction; MH Macular hole;  NVD neovascularization of the disc; NVE neovascularization elsewhere; AREDS age related eye disease study; ARMD age related macular degeneration; POAG primary open angle glaucoma; EBMD epithelial/anterior basement membrane dystrophy; ACIOL anterior chamber intraocular lens; IOL intraocular lens; PCIOL posterior chamber intraocular lens; Phaco/IOL phacoemulsification with intraocular lens placement; [place] photorefractive keratectomy; LASIK laser assisted in situ keratomileusis; HTN hypertension; DM diabetes mellitus; COPD chronic obstructive pulmonary disease

## 2019-09-15 ENCOUNTER — Ambulatory Visit (INDEPENDENT_AMBULATORY_CARE_PROVIDER_SITE_OTHER): Payer: PPO | Admitting: Ophthalmology

## 2019-09-15 ENCOUNTER — Encounter (INDEPENDENT_AMBULATORY_CARE_PROVIDER_SITE_OTHER): Payer: Self-pay | Admitting: Ophthalmology

## 2019-09-15 DIAGNOSIS — H35351 Cystoid macular degeneration, right eye: Secondary | ICD-10-CM

## 2019-09-15 DIAGNOSIS — H3581 Retinal edema: Secondary | ICD-10-CM

## 2019-09-15 DIAGNOSIS — I10 Essential (primary) hypertension: Secondary | ICD-10-CM

## 2019-09-15 DIAGNOSIS — H40041 Steroid responder, right eye: Secondary | ICD-10-CM | POA: Diagnosis not present

## 2019-09-15 DIAGNOSIS — Z961 Presence of intraocular lens: Secondary | ICD-10-CM | POA: Diagnosis not present

## 2019-09-15 DIAGNOSIS — H35033 Hypertensive retinopathy, bilateral: Secondary | ICD-10-CM | POA: Diagnosis not present

## 2019-09-15 DIAGNOSIS — Z9889 Other specified postprocedural states: Secondary | ICD-10-CM | POA: Diagnosis not present

## 2019-09-15 DIAGNOSIS — H401490 Capsular glaucoma with pseudoexfoliation of lens, unspecified eye, stage unspecified: Secondary | ICD-10-CM | POA: Diagnosis not present

## 2019-09-20 ENCOUNTER — Encounter: Payer: Self-pay | Admitting: Physical Therapy

## 2019-09-20 ENCOUNTER — Other Ambulatory Visit: Payer: Self-pay

## 2019-09-20 ENCOUNTER — Ambulatory Visit: Payer: PPO | Admitting: Physical Therapy

## 2019-09-20 DIAGNOSIS — N3946 Mixed incontinence: Secondary | ICD-10-CM

## 2019-09-20 DIAGNOSIS — M6281 Muscle weakness (generalized): Secondary | ICD-10-CM

## 2019-09-20 DIAGNOSIS — R159 Full incontinence of feces: Secondary | ICD-10-CM

## 2019-09-20 DIAGNOSIS — R278 Other lack of coordination: Secondary | ICD-10-CM

## 2019-09-20 NOTE — Therapy (Signed)
[facility] [address], [place], [place], [address] Phone: [phone]   Fax:  [phone]  Physical Therapy Treatment  Patient Details  Name: Jo Morrison MRN: [id number] Date of Birth: 01-13-1946 Referring Provider (PT): Dr. Silvano Rusk   Encounter Date: 09/20/2019  PT End of Session - 09/20/19 1159    Visit Number  7    Date for PT Re-Evaluation  10/12/19    Authorization Type  [employer]    PT Start Time  1145    PT Stop Time  1225    PT Time Calculation (min)  40 min    Activity Tolerance  Patient tolerated treatment well;No increased pain    Behavior During Therapy  WFL for tasks assessed/performed       Past Medical History:  Diagnosis Date  . Allergy   . Anxiety   . Asthma   . Breast cancer ([place]) 2012   left  . Breast cancer, right ([place]) 1996   right  . Cataract    cataracts removed  . Depressive disorder   . DVT (deep venous thrombosis) ([place])   . Glaucoma    not on eye drops at this time.  . Hypertension   . Olecranon bursitis   . Osteoarthritis   . Other disorders of bone and cartilage(733.99)   . Seasonal allergies   . Unspecified pruritic disorder     Past Surgical History:  Procedure Laterality Date  . APPENDECTOMY  1958  . BREAST LUMPECTOMY Right 1996   Dr. Corena Herter in [place]  . BREAST LUMPECTOMY Left 2011   Dr. Pleas Patricia  . cataract Bilateral   . CATARACT EXTRACTION Bilateral    Dr. Lucita Ferrara  . COLONOSCOPY  2011-last  . DUPUYTREN CONTRACTURE RELEASE    . LASIK Bilateral 2007  . POLYPECTOMY    . ROTATOR CUFF REPAIR  2007  . TONSILLECTOMY  1967    There were no vitals filed for this visit.  Subjective Assessment - 09/20/19 1150    Subjective  I am not having leakage. I am trimming perineals. I was pulling and fell into the fence with my hip. I am ready to move up to the next dilator today. I have not had diarrhea.    Patient Stated Goals  control the urination and  diahrrhea    Currently in Pain?  No/denies         [facility] PT Assessment - 09/20/19 0001      Assessment   Medical Diagnosis  N39.46 Mixed stress and urge urinary incontinence    Referring Provider (PT)  Dr. Silvano Rusk    Prior Therapy  no      Precautions   Precautions  Other (comment)    Precaution Comments  breast cancer estrogen positive      Restrictions   Weight Bearing Restrictions  No      [place] residence      Prior Function   Level of [place]  Retired      Associate Professor   Overall Cognitive Status  Within Functional Limits for tasks assessed      Posture/Postural Control   Posture/Postural Control  No significant limitations      PROM   Right Hip Flexion  120    Right Hip External Rotation   55    Left Hip Flexion  115    Left Hip External Rotation   52  Strength   Right Hip External Rotation   4+/5    Right Hip ABduction  4+/5    Left Hip External Rotation  4+/5    Left Hip ABduction  4+/5                Pelvic Floor Special Questions - 09/20/19 0001    Urinary Leakage  No    Fecal incontinence  No    Skin Integrity  Intact    Pelvic Floor Internal Exam  Patient confirms identification and approves PT to assess pelvic floor and muscles    Exam Type  Vaginal    Palpation  band 2 cm into the vaginal canal has opened up    Strength  fair squeeze, definite lift        [facility] Adult PT Treatment/Exercise - 09/20/19 0001      Self-Care   Self-Care  Other Self-Care Comments    Other Self-Care Comments   instruction on progression of the dilator, moving her hips around with dialtor in      Manual Therapy   Manual Therapy  Internal Pelvic Floor    Internal Pelvic Floor  perineal body, puborectalis; sides of introitus, 2.5 inches upward working on a band that is circular to open up the vagina             PT Education - 09/20/19 1226    Education Details  education  on progressing the sizes of the dilator and       PT Short Term Goals - 09/13/19 1229      PT SHORT TERM GOAL #2   Title  urinary and fecal leakage decreased >/= 25% so she s down to 10 pads per month    Time  4    Period  Weeks    Status  Achieved    Target Date  09/14/19        PT Long Term Goals - 09/20/19 1234      PT LONG TERM GOAL #1   Title  independent with advanced HEP    Time  8    Period  Weeks    Status  Achieved      PT LONG TERM GOAL #2   Title  urinary and fecal leakage decreased >/= 75% so patient is down to 1-2 pads per month    Time  8    Period  Weeks    Status  Achieved      PT LONG TERM GOAL #3   Title  able to feel the urge to urinate and urinate every 2 hours    Time  8    Period  Weeks    Status  Achieved      PT LONG TERM GOAL #4   Title  able to attempt intercourse with her husband due to elongation of the pelvic floor muscles    Time  8    Period  Weeks    Status  Achieved            Plan - 09/20/19 1228    Clinical Impression Statement  Patient has met her goals. Patient is not having urinary leakage or fecal incontinence. Patient pelvic floor strength is 3/5. Patient is now using dilators at home to expand the vaginal canal. Patient husband is helping her with the dilator. Patient is independent with her HEP.    Personal Factors and Comorbidities  Comorbidity 1;Sex;Age    Comorbidities  Breast cancer estrogen positive  Examination-Activity Limitations  Transfers;Squat;Continence;Toileting;Stand    Examination-Participation Restrictions  Community Activity;Interpersonal Relationship    Stability/Clinical Decision Making  Evolving/Moderate complexity    Rehab Potential  Excellent    PT Treatment/Interventions  Biofeedback;Cryotherapy;Electrical Stimulation;Moist Heat;Therapeutic activities;Therapeutic exercise;Patient/family education;Neuromuscular re-education;Manual techniques;Passive range of motion;Dry needling;Joint  Manipulations    PT Next Visit Plan  discharge to HEP    PT Home Exercise Plan  Access Code: [email or web address]    Consulted and Agree with Plan of Care  Patient       Patient will benefit from skilled therapeutic intervention in order to improve the following deficits and impairments:  Decreased coordination, Decreased range of motion, Increased fascial restricitons, Decreased endurance, Increased muscle spasms, Decreased activity tolerance, Impaired flexibility, Decreased mobility, Decreased strength  Visit Diagnosis: Muscle weakness (generalized)  Other lack of coordination  Mixed incontinence  Incontinence of feces, unspecified fecal incontinence type     Problem List Patient Active Problem List   Diagnosis Date Noted  . UTI symptoms 08/07/2019  . Chronic diarrhea 08/07/2019  . Rash 05/17/2019  . Low back pain 03/01/2019  . Routine general medical examination at a health care facility 08/16/2016  . Carpal tunnel syndrome, right 02/08/2016  . Ductal carcinoma in situ (DCIS) of left breast 10/28/2014  . Contracture of palmar fascia (Dupuytren's) 07/13/2014  . Hypertension   . Anxiety   . Depressive disorder   . Asthma   . Osteoarthritis   . Glaucoma   . Hx of adenomatous colonic polyps 11/01/2012  . Breast cancer of lower-outer quadrant of right female breast [facility]) 10/12/2012    Earlie Counts, PT 09/20/19 12:36 PM   [facility] [address] [place], [place], [address] Phone: [phone]   Fax:  [phone]  Name: Jo Morrison MRN: [id number] Date of Birth: [birth year]  PHYSICAL THERAPY DISCHARGE SUMMARY  Visits from Start of Care: 7  Current functional level related to goals / functional outcomes: See above.    Remaining deficits: See above.    Education / Equipment: HEP Plan: Patient agrees to discharge.  Patient goals were met. Patient is being discharged due to meeting the stated rehab goals. Thank  you for the referral. Earlie Counts, PT 09/20/19 12:37 PM   ?????

## 2019-09-24 ENCOUNTER — Encounter: Payer: Self-pay | Admitting: Internal Medicine

## 2019-09-24 ENCOUNTER — Other Ambulatory Visit: Payer: Self-pay

## 2019-09-24 ENCOUNTER — Ambulatory Visit (INDEPENDENT_AMBULATORY_CARE_PROVIDER_SITE_OTHER): Payer: PPO | Admitting: Internal Medicine

## 2019-09-24 ENCOUNTER — Other Ambulatory Visit (INDEPENDENT_AMBULATORY_CARE_PROVIDER_SITE_OTHER): Payer: PPO

## 2019-09-24 VITALS — BP 126/84 | HR 81 | Temp 98.5°F | Ht 63.0 in | Wt 125.0 lb

## 2019-09-24 DIAGNOSIS — J4520 Mild intermittent asthma, uncomplicated: Secondary | ICD-10-CM | POA: Diagnosis not present

## 2019-09-24 DIAGNOSIS — Z0000 Encounter for general adult medical examination without abnormal findings: Secondary | ICD-10-CM

## 2019-09-24 DIAGNOSIS — F419 Anxiety disorder, unspecified: Secondary | ICD-10-CM

## 2019-09-24 DIAGNOSIS — I10 Essential (primary) hypertension: Secondary | ICD-10-CM

## 2019-09-24 LAB — COMPREHENSIVE METABOLIC PANEL
ALT: 34 U/L (ref 0–35)
AST: 29 U/L (ref 0–37)
Albumin: 4.3 g/dL (ref 3.5–5.2)
Alkaline Phosphatase: 74 U/L (ref 39–117)
BUN: 13 mg/dL (ref 6–23)
CO2: 33 mEq/L — ABNORMAL HIGH (ref 19–32)
Calcium: 9.4 mg/dL (ref 8.4–10.5)
Chloride: 98 mEq/L (ref 96–112)
Creatinine, Ser: 0.77 mg/dL (ref 0.40–1.20)
GFR: 73.35 mL/min (ref >60.00)
Glucose, Bld: 107 mg/dL — ABNORMAL HIGH (ref 70–99)
Potassium: 3.2 mEq/L — ABNORMAL LOW (ref 3.5–5.1)
Sodium: 141 mEq/L (ref 135–145)
Total Bilirubin: 0.8 mg/dL (ref 0.2–1.2)
Total Protein: 7.4 g/dL (ref 6.0–8.3)

## 2019-09-24 LAB — CBC
HCT: 43.3 % (ref 36.0–46.0)
Hemoglobin: 14.3 g/dL (ref 12.0–15.0)
MCHC: 33.1 g/dL (ref 30.0–36.0)
MCV: 93.5 fl (ref 78.0–100.0)
Platelets: 252 10*3/uL (ref 150.0–400.0)
RBC: 4.63 Mil/uL (ref 3.87–5.11)
RDW: 13.3 % (ref 11.5–15.5)
WBC: 8.1 10*3/uL (ref 4.0–10.5)

## 2019-09-24 LAB — LIPID PANEL
Cholesterol: 179 mg/dL (ref 0–200)
HDL: 61.9 mg/dL (ref >39.00)
LDL Cholesterol: 96 mg/dL (ref 0–99)
NonHDL: 116.85
Total CHOL/HDL Ratio: 3
Triglycerides: 102 mg/dL (ref 0.0–149.0)
VLDL: 20.4 mg/dL (ref 0.0–40.0)

## 2019-09-24 NOTE — Assessment & Plan Note (Signed)
BP at goal on hctz. Checking CMP and adjust as needed.  

## 2019-09-24 NOTE — Assessment & Plan Note (Signed)
Stable without flare since last year.

## 2019-09-24 NOTE — Patient Instructions (Signed)
Health Maintenance, Female Adopting a healthy lifestyle and getting preventive care are important in promoting health and wellness. Ask your health care provider about:  The right schedule for you to have regular tests and exams.  Things you can do on your own to prevent diseases and keep yourself healthy. What should I know about diet, weight, and exercise? Eat a healthy diet   Eat a diet that includes plenty of vegetables, fruits, low-fat dairy products, and lean protein.  Do not eat a lot of foods that are high in solid fats, added sugars, or sodium. Maintain a healthy weight Body mass index (BMI) is used to identify weight problems. It estimates body fat based on height and weight. Your health care provider can help determine your BMI and help you achieve or maintain a healthy weight. Get regular exercise Get regular exercise. This is one of the most important things you can do for your health. Most adults should:  Exercise for at least 150 minutes each week. The exercise should increase your heart rate and make you sweat (moderate-intensity exercise).  Do strengthening exercises at least twice a week. This is in addition to the moderate-intensity exercise.  Spend less time sitting. Even light physical activity can be beneficial. Watch cholesterol and blood lipids Have your blood tested for lipids and cholesterol at 73 years of age, then have this test every 5 years. Have your cholesterol levels checked more often if:  Your lipid or cholesterol levels are high.  You are older than 73 years of age.  You are at high risk for heart disease. What should I know about cancer screening? Depending on your health history and family history, you may need to have cancer screening at various ages. This may include screening for:  Breast cancer.  Cervical cancer.  Colorectal cancer.  Skin cancer.  Lung cancer. What should I know about heart disease, diabetes, and high blood  pressure? Blood pressure and heart disease  High blood pressure causes heart disease and increases the risk of stroke. This is more likely to develop in people who have high blood pressure readings, are of African descent, or are overweight.  Have your blood pressure checked: ? Every 3-5 years if you are 18-39 years of age. ? Every year if you are 40 years old or older. Diabetes Have regular diabetes screenings. This checks your fasting blood sugar level. Have the screening done:  Once every three years after age 40 if you are at a normal weight and have a low risk for diabetes.  More often and at a younger age if you are overweight or have a high risk for diabetes. What should I know about preventing infection? Hepatitis B If you have a higher risk for hepatitis B, you should be screened for this virus. Talk with your health care provider to find out if you are at risk for hepatitis B infection. Hepatitis C Testing is recommended for:  Everyone born from 1945 through 1965.  Anyone with known risk factors for hepatitis C. Sexually transmitted infections (STIs)  Get screened for STIs, including gonorrhea and chlamydia, if: ? You are sexually active and are younger than 73 years of age. ? You are older than 73 years of age and your health care provider tells you that you are at risk for this type of infection. ? Your sexual activity has changed since you were last screened, and you are at increased risk for chlamydia or gonorrhea. Ask your health care provider if   you are at risk.  Ask your health care provider about whether you are at high risk for HIV. Your health care provider may recommend a prescription medicine to help prevent HIV infection. If you choose to take medicine to prevent HIV, you should first get tested for HIV. You should then be tested every 3 months for as long as you are taking the medicine. Pregnancy  If you are about to stop having your period (premenopausal) and  you may become pregnant, seek counseling before you get pregnant.  Take 400 to 800 micrograms (mcg) of folic acid every day if you become pregnant.  Ask for birth control (contraception) if you want to prevent pregnancy. Osteoporosis and menopause Osteoporosis is a disease in which the bones lose minerals and strength with aging. This can result in bone fractures. If you are 65 years old or older, or if you are at risk for osteoporosis and fractures, ask your health care provider if you should:  Be screened for bone loss.  Take a calcium or vitamin D supplement to lower your risk of fractures.  Be given hormone replacement therapy (HRT) to treat symptoms of menopause. Follow these instructions at home: Lifestyle  Do not use any products that contain nicotine or tobacco, such as cigarettes, e-cigarettes, and chewing tobacco. If you need help quitting, ask your health care provider.  Do not use street drugs.  Do not share needles.  Ask your health care provider for help if you need support or information about quitting drugs. Alcohol use  Do not drink alcohol if: ? Your health care provider tells you not to drink. ? You are pregnant, may be pregnant, or are planning to become pregnant.  If you drink alcohol: ? Limit how much you use to 0-1 drink a day. ? Limit intake if you are breastfeeding.  Be aware of how much alcohol is in your drink. In the [place] one drink equals one 12 oz bottle of beer (355 mL), one 5 oz glass of wine (148 mL), or one 1 oz glass of hard liquor (44 mL). General instructions  Schedule regular health, dental, and eye exams.  Stay current with your vaccines.  Tell your health care provider if: ? You often feel depressed. ? You have ever been abused or do not feel safe at home. Summary  Adopting a healthy lifestyle and getting preventive care are important in promoting health and wellness.  Follow your health care provider's instructions about healthy  diet, exercising, and getting tested or screened for diseases.  Follow your health care provider's instructions on monitoring your cholesterol and blood pressure. This information is not intended to replace advice given to you by your health care provider. Make sure you discuss any questions you have with your health care provider. Document Released: 05/27/2011 Document Revised: 11/04/2018 Document Reviewed: 11/04/2018 Elsevier Patient Education  2020 Elsevier Inc.  

## 2019-09-24 NOTE — Assessment & Plan Note (Signed)
Stable on effexor and clonazepam rare.

## 2019-09-24 NOTE — Progress Notes (Signed)
Subjective:   Patient ID: Jo Morrison, female    DOB: 12-08-1945, 73 y.o.   MRN: [id number]  HPI Here for medicare wellness, no new complaints. Please see A/P for status and treatment of chronic medical problems.   Diet: heart healthy Physical activity: sedentary Depression/mood screen: negative Hearing: intact to whispered voice Visual acuity: grossly normal, performs annual eye exam  ADLs: capable Fall risk: none Home safety: good Cognitive evaluation: intact to orientation, naming, recall and repetition EOL planning: adv directives discussed    Office Visit from 09/24/2019 in [facility]  PHQ-2 Total Score  0        Office Visit from 09/24/2019 in [facility]  PHQ-9 Total Score  0      I have personally reviewed and have noted 1. The patient's medical and social history - reviewed today no changes 2. Their use of alcohol, tobacco or illicit drugs 3. Their current medications and supplements 4. The patient's functional ability including ADL's, fall risks, home safety risks and hearing or visual impairment. 5. Diet and physical activities 6. Evidence for depression or mood disorders 7. Care team reviewed and updated  Patient Care Team: Hoyt Koch, MD as PCP - General (Internal Medicine) Nicholas Lose, MD as Consulting Physician (Hematology and Oncology) Gatha Mayer, MD as Consulting Physician (Gastroenterology) Past Medical History:  Diagnosis Date  . Allergy   . Anxiety   . Asthma   . Breast cancer ([place]) 2012   left  . Breast cancer, right ([place]) 1996   right  . Cataract    cataracts removed  . Depressive disorder   . DVT (deep venous thrombosis) ([place])   . Glaucoma    not on eye drops at this time.  . Hypertension   . Olecranon bursitis   . Osteoarthritis   . Other disorders of bone and cartilage(733.99)   . Seasonal allergies   . Unspecified pruritic disorder    Past Surgical History:   Procedure Laterality Date  . APPENDECTOMY  1958  . BREAST LUMPECTOMY Right 1996   Dr. Corena Herter in [place]  . BREAST LUMPECTOMY Left 2011   Dr. Pleas Patricia  . cataract Bilateral   . CATARACT EXTRACTION Bilateral    Dr. Lucita Ferrara  . COLONOSCOPY  2011-last  . DUPUYTREN CONTRACTURE RELEASE    . LASIK Bilateral 2007  . POLYPECTOMY    . ROTATOR CUFF REPAIR  2007  . TONSILLECTOMY  1967   Family History  Problem Relation Age of Onset  . Stroke Father 98  . Heart disease Brother   . Colon cancer Maternal Aunt 17  . Macular degeneration Mother   . Colon polyps Neg Hx   . Esophageal cancer Neg Hx   . Rectal cancer Neg Hx   . Stomach cancer Neg Hx     Review of Systems  Constitutional: Negative.   HENT: Negative.   Eyes: Negative.   Respiratory: Negative for cough, chest tightness and shortness of breath.   Cardiovascular: Negative for chest pain, palpitations and leg swelling.  Gastrointestinal: Negative for abdominal distention, abdominal pain, constipation, diarrhea, nausea and vomiting.  Musculoskeletal: Negative.   Skin: Negative.   Neurological: Negative.   Psychiatric/Behavioral: Negative.     Objective:  Physical Exam Constitutional:      Appearance: She is well-developed.  HENT:     Head: Normocephalic and atraumatic.  Neck:     Musculoskeletal: Normal range of motion.  Cardiovascular:  Rate and Rhythm: Normal rate and regular rhythm.  Pulmonary:     Effort: Pulmonary effort is normal. No respiratory distress.     Breath sounds: Normal breath sounds. No wheezing or rales.  Abdominal:     General: Bowel sounds are normal. There is no distension.     Palpations: Abdomen is soft.     Tenderness: There is no abdominal tenderness. There is no rebound.  Skin:    General: Skin is warm and dry.  Neurological:     Mental Status: She is alert and oriented to person, place, and time.     Coordination: Coordination normal.     Vitals:   09/24/19 1021  BP:  126/84  Pulse: 81  Temp: 98.5 F (36.9 C)  TempSrc: Oral  SpO2: 99%  Weight: 125 lb (56.7 kg)  Height: 5\' 3"  (1.6 m)    Assessment & Plan:

## 2019-09-24 NOTE — Assessment & Plan Note (Signed)
Flu shot up to date. Pneumonia complete. Shingrix counseled. Tetanus due 2026. Colonoscopy due 2024. Mammogram due 2021, pap smear aged out and dexa up to date due 2022. Counseled about sun safety and mole surveillance. Counseled about the dangers of distracted driving. Given 10 year screening recommendations.

## 2019-10-05 NOTE — Progress Notes (Signed)
[facility] Note  10/06/2019     CHIEF COMPLAINT Patient presents for Retina Follow Up   HISTORY OF PRESENT ILLNESS: Jo Morrison is a 73 y.o. female who presents to the clinic today for:   HPI    Retina Follow Up    Patient presents with  Other.  In right eye.  This started 3 weeks ago.  Severity is moderate.  I, the attending physician,  performed the HPI with the patient and updated documentation appropriately.          Comments    Patient here for 3 weeks retina follow up for CME OD. Patient states vision started noticing a decrease in vision in OD on Saturday (4 days ago). Vision blurry no eye pain. Noticed floaters that is new. OS is fine.        Last edited by Bernarda Caffey, MD on 10/06/2019  2:06 PM. (History)    pt states    Referring physician: Hortencia Pilar, MD [place],  [place] [id number]  HISTORICAL INFORMATION:   Selected notes from the MEDICAL RECORD NUMBER Referred by Dr. Quentin Ore for concern of uveitis OD LEE: 09.23.20 Read Drivers) [BCVA: OD: 20/60-- OS: 20/30] Ocular Hx-pseudoexfloiation glaucoma (s/p SLT OU), drusen OU, DES, pseudo OU (2016), lasik (1996) PMH-HTN, cancer, depression   CURRENT MEDICATIONS: Current Outpatient Medications (Ophthalmic Drugs)  Medication Sig  . Bromfenac Sodium (PROLENSA) 0.07 % SOLN Place 1 drop into the right eye 4 (four) times daily.  . dorzolamide-timolol (COSOPT) 22.3-6.8 MG/ML ophthalmic solution Place 1 drop into the right eye 3 (three) times daily.  . Loteprednol Etabonate (INVELTYS) 1 % SUSP Place 1 drop into the right eye 4 (four) times daily.  . prednisoLONE acetate (PRED FORTE) 1 % ophthalmic suspension Place 1 drop into the right eye as directed.   No current facility-administered medications for this visit.  (Ophthalmic Drugs)   Current Outpatient Medications (Other)  Medication Sig  . Albuterol Sulfate (PROAIR RESPICLICK) 123XX123 (90 BASE) MCG/ACT  AEPB Inhale 2 puffs into the lungs every 4 (four) hours as needed (SOB).  . budesonide (PULMICORT) 180 MCG/ACT inhaler Inhale 2 puffs into the lungs 2 (two) times daily.  . clonazePAM (KLONOPIN) 0.5 MG tablet Take 1 tablet (0.5 mg total) by mouth at bedtime as needed.  . fluticasone (FLONASE) 50 MCG/ACT nasal spray Place into the nose.  . gabapentin (NEURONTIN) 100 MG capsule Take 100 mg by mouth at bedtime as needed. Patient takes 100 mg at night  . hydrochlorothiazide (HYDRODIURIL) 25 MG tablet TAKE 1 TABLET(25 MG) BY MOUTH DAILY  . methocarbamol (ROBAXIN) 500 MG tablet Take 1 tablet (500 mg total) by mouth every 8 (eight) hours as needed for muscle spasms.  . montelukast (SINGULAIR) 10 MG tablet TAKE 1 TABLET(10 MG) BY MOUTH AT BEDTIME  . traZODone (DESYREL) 100 MG tablet Take 1-2 tablets (100-200 mg total) by mouth at bedtime as needed for sleep.  . traZODone (DESYREL) 150 MG tablet Take 150 mg by mouth at bedtime.  . triamcinolone cream (KENALOG) 0.1 % Apply 1 application topically 2 (two) times daily.  Marland Kitchen venlafaxine XR (EFFEXOR-XR) 150 MG 24 hr capsule Take 1 capsule (150 mg total) by mouth daily. Take one tablet by mouth once daily   No current facility-administered medications for this visit.  (Other)      REVIEW OF SYSTEMS:    ALLERGIES Allergies  Allergen Reactions  . Latex Shortness Of Breath  PAST MEDICAL HISTORY Past Medical History:  Diagnosis Date  . Allergy   . Anxiety   . Asthma   . Breast cancer ([place]) 2012   left  . Breast cancer, right ([place]) 1996   right  . Cataract    cataracts removed  . Depressive disorder   . DVT (deep venous thrombosis) ([place])   . Glaucoma    not on eye drops at this time.  . Hypertension   . Olecranon bursitis   . Osteoarthritis   . Other disorders of bone and cartilage(733.99)   . Seasonal allergies   . Unspecified pruritic disorder    Past Surgical History:  Procedure Laterality Date  . APPENDECTOMY  1958  . BREAST  LUMPECTOMY Right 1996   Dr. Corena Herter in [place]  . BREAST LUMPECTOMY Left 2011   Dr. Pleas Patricia  . cataract Bilateral   . CATARACT EXTRACTION Bilateral    Dr. Lucita Ferrara  . COLONOSCOPY  2011-last  . DUPUYTREN CONTRACTURE RELEASE    . LASIK Bilateral 2007  . POLYPECTOMY    . ROTATOR CUFF REPAIR  2007  . TONSILLECTOMY  1967    FAMILY HISTORY Family History  Problem Relation Age of Onset  . Stroke Father 13  . Heart disease Brother   . Colon cancer Maternal Aunt 11  . Macular degeneration Mother   . Colon polyps Neg Hx   . Esophageal cancer Neg Hx   . Rectal cancer Neg Hx   . Stomach cancer Neg Hx     SOCIAL HISTORY Social History   Tobacco Use  . Smoking status: Never Smoker  . Smokeless tobacco: Never Used  Substance Use Topics  . Alcohol use: Yes    Alcohol/week: 25.0 standard drinks    Types: 25 Glasses of wine per week  . Drug use: No         OPHTHALMIC EXAM:  Base Eye Exam    Visual Acuity (Snellen - Linear)      Right Left   Dist cc 20/80 -2 20/50   Dist ph cc NI NI   Correction: Glasses  Pt used sunglasses.       Tonometry (Tonopen, 1:24 PM)      Right Left   Pressure 19 16       Pupils      Dark Light Shape React APD   Right 4 3.5 Round Minimal None   Left 3 2 Round Brisk None       Visual Fields (Counting fingers)      Left Right    Full Full       Extraocular Movement      Right Left    Full, Ortho Full, Ortho       Neuro/Psych    Oriented x3: Yes   Mood/Affect: Normal       Dilation    Both eyes: 1.0% Mydriacyl, 2.5% Phenylephrine @ 1:24 PM        Slit Lamp and Fundus Exam    Slit Lamp Exam      Right Left   Lids/Lashes Dermatochalasis - upper lid, Meibomian gland dysfunction Dermatochalasis - upper lid, Meibomian gland dysfunction   Conjunctiva/Sclera White and quiet White and quiet   Cornea Mild Arcus, well healed lasik flap, Well healed temporal cataract wounds, 2-3+ Punctate epithelial erosions, Descemet's  folds Mild Arcus, well healed lasik flap, Well healed temporal cataract wounds, 3+ Punctate epithelial erosions   Anterior Chamber Deep and quiet, no cell or flare Deep and  quiet, no cell or flare   Iris Round and moderately dilated to 5mm, mild pseudo exfoliative material on pupil margin Round and moderately dilated to 5.69mm   Lens PC IOL in good position PC IOL in good position, 1+ superior Posterior capsular opacification   Vitreous Vitreous syneresis, no vit cells Vitreous syneresis       Fundus Exam      Right Left   Disc Pink and Sharp, temporal PPP Pink and Sharp, temporal PPA   C/D Ratio 0.6 0.6   Macula Flat, Blunted foveal reflex, interval improvement in IRF/SRF centrally, no heme Flat, good foveal reflex, mild Retinal pigment epithelial mottling, No heme or edema   Vessels Vascular attenuation, Tortuous, no sheathing or vasculitis Vascular attenuation, Tortuous, no sheathing or vasculitis   Periphery Attached, pigmented CR scar at 0400 equator, reticular degeneration Attached, reticular degeneration        Refraction    Wearing Rx      Sphere Cylinder Axis   Right -1.75 +1.00 007   Left -2.50 +1.00 157          IMAGING AND PROCEDURES  Imaging and Procedures for @TODAY @  OCT, Retina - OU - Both Eyes       Right Eye Quality was good. Central Foveal Thickness: 285. Progression has improved. Findings include intraretinal fluid, cystoid macular edema, normal foveal contour, no SRF (Interval improvement in IRF/SRF, now just mild cystic change temporal fovea ).   Left Eye Quality was good. Central Foveal Thickness: 246. Progression has been stable. Findings include normal foveal contour, no IRF, no SRF.   Notes *Images captured and stored on drive  Diagnosis / Impression:  OD: Interval improvement in IRF/SRF, now just mild cystic change temporal fovea  OS: NFP, no IRF/SRF  Clinical management:  See below  Abbreviations: NFP - Normal foveal profile. CME - cystoid  macular edema. PED - pigment epithelial detachment. IRF - intraretinal fluid. SRF - subretinal fluid. EZ - ellipsoid zone. ERM - epiretinal membrane. ORA - outer retinal atrophy. ORT - outer retinal tubulation. SRHM - subretinal hyper-reflective material                 ASSESSMENT/PLAN:    ICD-10-CM   1. CME (cystoid macular edema), right  H35.351   2. Retinal edema  H35.81 OCT, Retina - OU - Both Eyes  3. Pseudoexfoliation (PXF) glaucoma, unspecified glaucoma stage  H40.1490   4. Steroid responder, right eye  H40.041   5. Essential hypertension  I10   6. Hypertensive retinopathy of both eyes  H35.033   7. Pseudophakia of both eyes  Z96.1   8. Hx of LASIK  Z98.890   9. Dry eyes  H04.123     1,2. 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 History of pseudoexfoliation glaucoma and steroid response OD  - under the expert management of Dr. Kathlen Mody  - s/p SLT x2  - tmax 32 OD while on Pred-Forte  - IOP 19 today  - continue PF Cosopt TID OD  5,6. Hypertensive retinopathy OU  - discussed importance of tight BP control  - monitor  7. Pseudophakia OU  - s/p CE/IOL (Dr. Astrid Drafts)  - IOLs in good position  - monitor  8. History of  Lasik  - 23 years ago in [place]  9. Dry eyes OU - recommend artificial tears and lubricating ointment as needed    Ophthalmic Meds Ordered this visit:  Meds ordered this encounter  Medications  . Loteprednol Etabonate (INVELTYS) 1 % SUSP    Sig: Place 1 drop into the right eye 4 (four) times daily.    Dispense:  2.8 mL    Refill:  2       Return for 3-4 wks - f/u CME OD.  There are no Patient Instructions on file for this visit.   Explained the diagnoses, plan, and follow up with the patient and they expressed understanding.  Patient expressed understanding of the importance of proper follow up care.   This document serves as a record of services personally performed by Gardiner Sleeper, MD, PhD. It was created on their behalf by Roselee Nova, COMT. The creation of this record is the provider's dictation and/or activities during the visit.  Electronically signed by: Roselee Nova, COMT 10/10/19 8:23 PM   Gardiner Sleeper, M.D., Ph.D. Diseases & Surgery of the Retina and Vitreous [facility] 10/06/2019   I have reviewed the above documentation for accuracy and completeness, and I agree with the above. Gardiner Sleeper, M.D., Ph.D. 10/10/19 8:23 PM   Abbreviations: M myopia (nearsighted); A astigmatism; H hyperopia (farsighted); P presbyopia; Mrx spectacle prescription;  CTL contact lenses; OD right eye; OS left eye; OU both eyes  XT exotropia; ET esotropia; PEK punctate epithelial keratitis; PEE punctate epithelial erosions; DES dry eye syndrome; MGD meibomian gland dysfunction; ATs artificial tears; PFAT's preservative free artificial tears; [place] nuclear sclerotic cataract; PSC posterior subcapsular cataract; ERM epi-retinal membrane; PVD posterior vitreous detachment; RD retinal detachment; DM diabetes mellitus; DR diabetic retinopathy; NPDR non-proliferative diabetic retinopathy; PDR proliferative diabetic retinopathy; CSME clinically significant macular  edema; DME diabetic macular edema; dbh dot blot hemorrhages; CWS cotton wool spot; POAG primary open angle glaucoma; C/D cup-to-disc ratio; HVF humphrey visual field; GVF goldmann visual field; OCT optical coherence tomography; IOP intraocular pressure; BRVO Branch retinal vein occlusion; CRVO central retinal vein occlusion; CRAO central retinal artery occlusion; BRAO branch retinal artery occlusion; RT retinal tear; SB scleral buckle; PPV pars plana vitrectomy; VH Vitreous hemorrhage; PRP panretinal laser photocoagulation; IVK intravitreal kenalog; VMT vitreomacular traction; MH Macular hole;  NVD neovascularization of the disc; NVE neovascularization elsewhere; AREDS age related eye disease study; ARMD age related macular degeneration; POAG primary open angle glaucoma; EBMD epithelial/anterior basement membrane dystrophy; ACIOL anterior chamber intraocular lens; IOL intraocular lens; PCIOL posterior chamber intraocular lens; Phaco/IOL phacoemulsification with intraocular lens placement; [place] photorefractive keratectomy; LASIK laser assisted in situ keratomileusis; HTN hypertension; DM diabetes mellitus; COPD chronic obstructive pulmonary disease

## 2019-10-06 ENCOUNTER — Ambulatory Visit (INDEPENDENT_AMBULATORY_CARE_PROVIDER_SITE_OTHER): Payer: PPO | Admitting: Ophthalmology

## 2019-10-06 ENCOUNTER — Encounter (INDEPENDENT_AMBULATORY_CARE_PROVIDER_SITE_OTHER): Payer: Self-pay | Admitting: Ophthalmology

## 2019-10-06 ENCOUNTER — Other Ambulatory Visit: Payer: Self-pay

## 2019-10-06 DIAGNOSIS — H3581 Retinal edema: Secondary | ICD-10-CM

## 2019-10-06 DIAGNOSIS — Z9889 Other specified postprocedural states: Secondary | ICD-10-CM

## 2019-10-06 DIAGNOSIS — H401490 Capsular glaucoma with pseudoexfoliation of lens, unspecified eye, stage unspecified: Secondary | ICD-10-CM

## 2019-10-06 DIAGNOSIS — H35351 Cystoid macular degeneration, right eye: Secondary | ICD-10-CM | POA: Diagnosis not present

## 2019-10-06 DIAGNOSIS — Z961 Presence of intraocular lens: Secondary | ICD-10-CM | POA: Diagnosis not present

## 2019-10-06 DIAGNOSIS — H35033 Hypertensive retinopathy, bilateral: Secondary | ICD-10-CM | POA: Diagnosis not present

## 2019-10-06 DIAGNOSIS — H40041 Steroid responder, right eye: Secondary | ICD-10-CM | POA: Diagnosis not present

## 2019-10-06 DIAGNOSIS — H04123 Dry eye syndrome of bilateral lacrimal glands: Secondary | ICD-10-CM

## 2019-10-06 DIAGNOSIS — I10 Essential (primary) hypertension: Secondary | ICD-10-CM

## 2019-10-06 MED ORDER — INVELTYS 1 % OP SUSP: 1.0000 [drp] | Freq: Four times a day (QID) | OPHTHALMIC | 2 refills | Status: DC

## 2019-10-10 ENCOUNTER — Encounter: Payer: Self-pay | Admitting: Internal Medicine

## 2019-10-11 MED ORDER — PANTOPRAZOLE SODIUM 40 MG PO TBEC: 40.0000 mg | DELAYED_RELEASE_TABLET | Freq: Every day | ORAL | 0 refills | Status: DC

## 2019-10-11 NOTE — Telephone Encounter (Signed)
Ok to add protonix 40 qd

## 2019-10-25 ENCOUNTER — Encounter (INDEPENDENT_AMBULATORY_CARE_PROVIDER_SITE_OTHER): Payer: Self-pay | Admitting: Ophthalmology

## 2019-11-01 ENCOUNTER — Other Ambulatory Visit: Payer: Self-pay

## 2019-11-01 DIAGNOSIS — M720 Palmar fascial fibromatosis [Dupuytren]: Secondary | ICD-10-CM | POA: Diagnosis not present

## 2019-11-01 NOTE — Progress Notes (Signed)
[facility] Note  11/03/2019     CHIEF COMPLAINT Patient presents for Retina Follow Up   HISTORY OF PRESENT ILLNESS: Jo Morrison is a 73 y.o. female who presents to the clinic today for:   HPI    Retina Follow Up    In right eye.  This started 3 months ago.  Since onset it is gradually improving.  I, the attending physician,  performed the HPI with the patient and updated documentation appropriately.          Comments    F/U CME OD. Patient states her vision is "much better", denies new visual onsets       Last edited by Bernarda Caffey, MD on 11/03/2019  1:44 PM. (History)    pt states she is not sure if her vision is better, but she states she is not bothered by lights anymore, pt states she missed a day of Inveltys due to insurance purposes, but she is now using it and Prolensa QID OD, she states she noticed a "haze" in her vision this weekend   Referring physician: Hortencia Pilar, MD [place],  [place] [address]  HISTORICAL INFORMATION:   Selected notes from the [facility] Referred by Dr. Quentin Ore for concern of uveitis OD LEE: 09.23.20 Read Drivers) [BCVA: OD: 20/60-- OS: 20/30] Ocular Hx-pseudoexfloiation glaucoma (s/p SLT OU), drusen OU, DES, pseudo OU (2016), lasik (1996) PMH-HTN, cancer, depression   CURRENT MEDICATIONS: Current Outpatient Medications (Ophthalmic Drugs)  Medication Sig  . Bromfenac Sodium (PROLENSA) 0.07 % SOLN Place 1 drop into the right eye 4 (four) times daily.  . dorzolamide-timolol (COSOPT) 22.3-6.8 MG/ML ophthalmic solution Place 1 drop into the right eye 3 (three) times daily.  . Loteprednol Etabonate (INVELTYS) 1 % SUSP Place 1 drop into the right eye 4 (four) times daily.  . prednisoLONE acetate (PRED FORTE) 1 % ophthalmic suspension Place 1 drop into the right eye as directed.   No current facility-administered medications for this visit.  (Ophthalmic Drugs)   Current  Outpatient Medications (Other)  Medication Sig  . Albuterol Sulfate (PROAIR RESPICLICK) [id number] (90 BASE) MCG/ACT AEPB Inhale 2 puffs into the lungs every 4 (four) hours as needed (SOB).  . budesonide (PULMICORT) 180 MCG/ACT inhaler Inhale 2 puffs into the lungs 2 (two) times daily.  . clonazePAM (KLONOPIN) 0.5 MG tablet Take 1 tablet (0.5 mg total) by mouth at bedtime as needed.  . fluticasone (FLONASE) 50 MCG/ACT nasal spray Place into the nose.  . gabapentin (NEURONTIN) 100 MG capsule Take 100 mg by mouth at bedtime as needed. Patient takes 100 mg at night  . hydrochlorothiazide (HYDRODIURIL) 25 MG tablet TAKE 1 TABLET(25 MG) BY MOUTH DAILY  . methocarbamol (ROBAXIN) 500 MG tablet Take 1 tablet (500 mg total) by mouth every 8 (eight) hours as needed for muscle spasms.  . montelukast (SINGULAIR) 10 MG tablet TAKE 1 TABLET(10 MG) BY MOUTH AT BEDTIME  . pantoprazole (PROTONIX) 40 MG tablet Take 1 tablet (40 mg total) by mouth daily.  . traZODone (DESYREL) 100 MG tablet Take 1-2 tablets (100-200 mg total) by mouth at bedtime as needed for sleep.  . traZODone (DESYREL) 150 MG tablet Take 150 mg by mouth at bedtime.  . triamcinolone cream (KENALOG) 0.1 % Apply 1 application topically 2 (two) times daily.  Marland Kitchen venlafaxine XR (EFFEXOR-XR) 150 MG 24 hr capsule Take 1 capsule (150 mg total) by mouth daily. Take one tablet by  mouth once daily   No current facility-administered medications for this visit.  (Other)      REVIEW OF SYSTEMS: ROS    Positive for: Eyes   Negative for: Constitutional, Gastrointestinal, Neurological, Skin, Genitourinary, Musculoskeletal, HENT, Endocrine, Cardiovascular, Respiratory, Psychiatric, Allergic/Imm, Heme/Lymph   Last edited by Zenovia Jordan, LPN on [id number]  D34-534 PM. (History)       ALLERGIES Allergies  Allergen Reactions  . Latex Shortness Of Breath    PAST MEDICAL HISTORY Past Medical History:  Diagnosis Date  . Allergy   . Anxiety   . Asthma   .  Breast cancer ([place]) 2012   left  . Breast cancer, right ([place]) 1996   right  . Cataract    cataracts removed  . Depressive disorder   . DVT (deep venous thrombosis) ([place])   . Glaucoma    not on eye drops at this time.  . Hypertension   . Olecranon bursitis   . Osteoarthritis   . Other disorders of bone and cartilage(733.99)   . Seasonal allergies   . Unspecified pruritic disorder    Past Surgical History:  Procedure Laterality Date  . APPENDECTOMY  1958  . BREAST LUMPECTOMY Right 1996   Dr. Corena Herter in [place]  . BREAST LUMPECTOMY Left 2011   Dr. Pleas Patricia  . cataract Bilateral   . CATARACT EXTRACTION Bilateral    Dr. Lucita Ferrara  . COLONOSCOPY  2011-last  . DUPUYTREN CONTRACTURE RELEASE    . LASIK Bilateral 2007  . POLYPECTOMY    . ROTATOR CUFF REPAIR  2007  . TONSILLECTOMY  1967    FAMILY HISTORY Family History  Problem Relation Age of Onset  . Stroke Father 73  . Heart disease Brother   . Colon cancer Maternal Aunt 64  . Macular degeneration Mother   . Colon polyps Neg Hx   . Esophageal cancer Neg Hx   . Rectal cancer Neg Hx   . Stomach cancer Neg Hx     SOCIAL HISTORY Social History   Tobacco Use  . Smoking status: Never Smoker  . Smokeless tobacco: Never Used  Substance Use Topics  . Alcohol use: Yes    Alcohol/week: 25.0 standard drinks    Types: 25 Glasses of wine per week  . Drug use: No         OPHTHALMIC EXAM:  Base Eye Exam    Visual Acuity (Snellen - Linear)      Right Left   Dist cc 20/150 20/25   Dist ph cc NI NI   Correction: Glasses  glasses are very dark       Tonometry (Tonopen, 1:24 PM)      Right Left   Pressure 12 12       Pupils      Dark Light Shape React APD   Right 3 2 Round Brisk None   Left 3 2 Round Brisk None       Visual Fields (Counting fingers)      Left Right    Full Full       Extraocular Movement      Right Left    Full, Ortho Full, Ortho       Neuro/Psych    Oriented x3: Yes    Mood/Affect: Normal       Dilation    Both eyes: 1.0% Mydriacyl, 2.5% Phenylephrine @ 1:20 PM        Slit Lamp and Fundus Exam    Slit Lamp Exam  Right Left   Lids/Lashes Dermatochalasis - upper lid, Meibomian gland dysfunction Dermatochalasis - upper lid, Meibomian gland dysfunction   Conjunctiva/Sclera White and quiet White and quiet   Cornea Mild Arcus, well healed lasik flap, Well healed temporal cataract wounds, 2-3+ Punctate epithelial erosions, 2-3+ Descemet's folds, diffuse corneal haze and microcystic edema Mild Arcus, well healed lasik flap, Well healed temporal cataract wounds, 3+ Punctate epithelial erosions   Anterior Chamber Deep and quiet, no cell or flare Deep and quiet, no cell or flare   Iris Round and moderately dilated to 29mm, mild pseudo exfoliative material on pupil margin Round and moderately dilated to 5.31mm   Lens PC IOL in good position PC IOL in good position, 1+ superior Posterior capsular opacification   Vitreous Vitreous syneresis, no vit cells Vitreous syneresis       Fundus Exam      Right Left   Disc Pink and Sharp, temporal PPP Pink and Sharp, temporal PPA   C/D Ratio 0.6 0.6   Macula Flat, Blunted foveal reflex, stable improvement in IRF/SRF centrally, no heme Flat, good foveal reflex, mild Retinal pigment epithelial mottling, No heme or edema   Vessels Vascular attenuation, Tortuous, no sheathing or vasculitis Vascular attenuation, Tortuous, no sheathing or vasculitis   Periphery Attached, pigmented CR scar at 0400 equator, reticular degeneration Attached, reticular degeneration          IMAGING AND PROCEDURES  Imaging and Procedures for @TODAY @  OCT, Retina - OU - Both Eyes       Right Eye Quality was poor. Findings include intraretinal fluid, cystoid macular edema, normal foveal contour, no SRF (No view centrally to distinguish central IRF).   Left Eye Quality was good. Central Foveal Thickness: 245. Progression has been stable.  Findings include normal foveal contour, no IRF, no SRF.   Notes *Images captured and stored on drive  Diagnosis / Impression:  OD: No view centrally to distinguish chnages central IRF -- grossly stable and not significantly worse OS: NFP, no IRF/SRF  Clinical management:  See below  Abbreviations: NFP - Normal foveal profile. CME - cystoid macular edema. PED - pigment epithelial detachment. IRF - intraretinal fluid. SRF - subretinal fluid. EZ - ellipsoid zone. ERM - epiretinal membrane. ORA - outer retinal atrophy. ORT - outer retinal tubulation. SRHM - subretinal hyper-reflective material                 ASSESSMENT/PLAN:    ICD-10-CM   1. CME (cystoid macular edema), right  H35.351   2. Retinal edema  H35.81 OCT, Retina - OU - Both Eyes  3. Pseudoexfoliation (PXF) glaucoma, unspecified glaucoma stage  H40.1490   4. Steroid responder, right eye  H40.041   5. Essential hypertension  I10   6. Hypertensive retinopathy of both eyes  H35.033   7. Pseudophakia of both eyes  Z96.1   8. Hx of LASIK  Z98.890   9. Dry eyes  H04.123   10. Corneal edema of right eye  H18.20     1,2. 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 Kathlen Mody for new onset corneal edema  - f/u 2 weeks -- if  CME persists/worsens, will consider STK OD   3,4. History of pseudoexfoliation glaucoma and steroid response OD  - under the expert management of Dr. Kathlen Mody  - s/p SLT x2  - tmax 32 OD while on Pred-Forte  - IOP 19 today  - continue PF Cosopt TID OD  5,6. Hypertensive retinopathy OU  - discussed importance of tight BP control  - monitor  7. Pseudophakia OU  - s/p CE/IOL (Dr. Astrid Drafts)  - IOLs in good position  - monitor  8. History of Lasik  - 23 years ago in [place]  9. Dry eyes OU - recommend artificial tears and lubricating ointment as needed  10. Corneal edema OD  - new onset 2-3+ MCE with Descemet folds  - will decrease inveltys and prolensa to BID OD and refer to Dr. Kathlen Mody for eval and management of corneal edema    Ophthalmic Meds Ordered this visit:  No orders of the defined types were placed in this encounter.      Return in about 2 weeks (around 11/17/2019) for f/u CME OD, DFE, OCT.  There are no Patient Instructions on file for this visit.   Explained the diagnoses, plan, and follow up with the patient and they expressed understanding.  Patient expressed understanding of the importance of proper follow up care.   This document serves as a record of services personally performed by Gardiner Sleeper, MD, PhD. It was created on their behalf by Roselee Nova, COMT. The creation of this record is the provider's dictation and/or activities during the visit.  Electronically signed by: Roselee Nova, COMT 11/03/19 2:09 PM   This document serves as a record of services personally performed by Gardiner Sleeper, MD, PhD. It was created on their behalf by Ernest Mallick, OA, an ophthalmic assistant. The creation of this record is the provider's dictation and/or activities during the visit.    Electronically signed by: Ernest Mallick, OA 12.09.2020 2:09 PM  Gardiner Sleeper, M.D., Ph.D. Diseases & Surgery of the Retina and [place] 11/03/2019   I have reviewed the above documentation for accuracy and completeness, and I agree with the above. Gardiner Sleeper, M.D., Ph.D. 11/03/19 2:09 PM   Abbreviations: M myopia (nearsighted); A astigmatism; H hyperopia (farsighted); P presbyopia; Mrx spectacle prescription;  CTL contact lenses; OD right eye; OS left eye; OU both eyes  XT exotropia; ET esotropia; PEK punctate epithelial keratitis; PEE punctate epithelial erosions; DES dry eye syndrome; MGD meibomian gland dysfunction; ATs artificial tears; PFAT's preservative free artificial tears; [place] nuclear sclerotic cataract; PSC posterior subcapsular cataract; ERM epi-retinal membrane; PVD posterior vitreous detachment; RD retinal detachment; DM diabetes mellitus; DR diabetic retinopathy; NPDR non-proliferative diabetic retinopathy; PDR proliferative diabetic retinopathy; CSME clinically significant macular edema; DME diabetic macular edema; dbh dot blot hemorrhages; CWS cotton wool spot; POAG primary open angle glaucoma; C/D cup-to-disc ratio; HVF humphrey visual field; GVF goldmann visual field; OCT optical coherence tomography; IOP intraocular pressure; BRVO Branch retinal vein occlusion; CRVO central retinal vein occlusion; CRAO central retinal  artery occlusion; BRAO branch retinal artery occlusion; RT retinal tear; SB scleral buckle; PPV pars plana vitrectomy; VH Vitreous hemorrhage; PRP panretinal laser photocoagulation; IVK intravitreal kenalog; VMT vitreomacular traction; MH Macular hole;  NVD neovascularization of the disc; NVE neovascularization elsewhere; AREDS age related eye disease study; ARMD age related macular degeneration; POAG primary open angle glaucoma; EBMD epithelial/anterior basement membrane dystrophy; ACIOL anterior chamber intraocular lens; IOL intraocular lens; PCIOL posterior chamber intraocular lens; Phaco/IOL phacoemulsification with intraocular lens placement; [place] photorefractive keratectomy; LASIK laser  assisted in situ keratomileusis; HTN hypertension; DM diabetes mellitus; COPD chronic obstructive pulmonary disease

## 2019-11-03 ENCOUNTER — Ambulatory Visit (INDEPENDENT_AMBULATORY_CARE_PROVIDER_SITE_OTHER): Payer: PPO | Admitting: Ophthalmology

## 2019-11-03 ENCOUNTER — Other Ambulatory Visit: Payer: Self-pay

## 2019-11-03 ENCOUNTER — Encounter (INDEPENDENT_AMBULATORY_CARE_PROVIDER_SITE_OTHER): Payer: Self-pay | Admitting: Ophthalmology

## 2019-11-03 DIAGNOSIS — Z961 Presence of intraocular lens: Secondary | ICD-10-CM | POA: Diagnosis not present

## 2019-11-03 DIAGNOSIS — H3581 Retinal edema: Secondary | ICD-10-CM

## 2019-11-03 DIAGNOSIS — H35033 Hypertensive retinopathy, bilateral: Secondary | ICD-10-CM

## 2019-11-03 DIAGNOSIS — H1820 Unspecified corneal edema: Secondary | ICD-10-CM

## 2019-11-03 DIAGNOSIS — Z9889 Other specified postprocedural states: Secondary | ICD-10-CM

## 2019-11-03 DIAGNOSIS — H401490 Capsular glaucoma with pseudoexfoliation of lens, unspecified eye, stage unspecified: Secondary | ICD-10-CM

## 2019-11-03 DIAGNOSIS — H40041 Steroid responder, right eye: Secondary | ICD-10-CM

## 2019-11-03 DIAGNOSIS — I10 Essential (primary) hypertension: Secondary | ICD-10-CM | POA: Diagnosis not present

## 2019-11-03 DIAGNOSIS — H35351 Cystoid macular degeneration, right eye: Secondary | ICD-10-CM

## 2019-11-03 DIAGNOSIS — H04123 Dry eye syndrome of bilateral lacrimal glands: Secondary | ICD-10-CM | POA: Diagnosis not present

## 2019-11-03 DIAGNOSIS — H401431 Capsular glaucoma with pseudoexfoliation of lens, bilateral, mild stage: Secondary | ICD-10-CM | POA: Diagnosis not present

## 2019-11-03 DIAGNOSIS — H2000 Unspecified acute and subacute iridocyclitis: Secondary | ICD-10-CM | POA: Diagnosis not present

## 2019-11-04 DIAGNOSIS — M25641 Stiffness of right hand, not elsewhere classified: Secondary | ICD-10-CM | POA: Diagnosis not present

## 2019-11-09 ENCOUNTER — Other Ambulatory Visit: Payer: Self-pay | Admitting: Internal Medicine

## 2019-11-10 ENCOUNTER — Other Ambulatory Visit: Payer: Self-pay | Admitting: Physician Assistant

## 2019-11-11 DIAGNOSIS — H18231 Secondary corneal edema, right eye: Secondary | ICD-10-CM | POA: Diagnosis not present

## 2019-11-11 DIAGNOSIS — H2000 Unspecified acute and subacute iridocyclitis: Secondary | ICD-10-CM | POA: Diagnosis not present

## 2019-11-11 DIAGNOSIS — H401431 Capsular glaucoma with pseudoexfoliation of lens, bilateral, mild stage: Secondary | ICD-10-CM | POA: Diagnosis not present

## 2019-11-11 DIAGNOSIS — H04123 Dry eye syndrome of bilateral lacrimal glands: Secondary | ICD-10-CM | POA: Diagnosis not present

## 2019-11-11 DIAGNOSIS — M25641 Stiffness of right hand, not elsewhere classified: Secondary | ICD-10-CM | POA: Diagnosis not present

## 2019-11-17 ENCOUNTER — Encounter (INDEPENDENT_AMBULATORY_CARE_PROVIDER_SITE_OTHER): Payer: PPO | Admitting: Ophthalmology

## 2019-11-22 DIAGNOSIS — M25641 Stiffness of right hand, not elsewhere classified: Secondary | ICD-10-CM | POA: Diagnosis not present

## 2019-11-27 ENCOUNTER — Encounter: Payer: Self-pay | Admitting: Internal Medicine

## 2019-11-29 ENCOUNTER — Other Ambulatory Visit (INDEPENDENT_AMBULATORY_CARE_PROVIDER_SITE_OTHER): Payer: PPO

## 2019-11-29 ENCOUNTER — Ambulatory Visit (INDEPENDENT_AMBULATORY_CARE_PROVIDER_SITE_OTHER)
Admission: RE | Admit: 2019-11-29 | Discharge: 2019-11-29 | Disposition: A | Discharge status: Home / Self Care | Payer: PPO | Source: Ambulatory Visit | Attending: Family | Admitting: Family

## 2019-11-29 ENCOUNTER — Other Ambulatory Visit: Payer: Self-pay | Admitting: Family

## 2019-11-29 ENCOUNTER — Ambulatory Visit (INDEPENDENT_AMBULATORY_CARE_PROVIDER_SITE_OTHER): Payer: PPO | Admitting: Family

## 2019-11-29 ENCOUNTER — Encounter: Payer: Self-pay | Admitting: Family

## 2019-11-29 ENCOUNTER — Other Ambulatory Visit: Payer: Self-pay

## 2019-11-29 VITALS — BP 126/84 | HR 100 | Temp 98.2°F | Ht 63.0 in | Wt 122.4 lb

## 2019-11-29 DIAGNOSIS — R109 Unspecified abdominal pain: Secondary | ICD-10-CM

## 2019-11-29 DIAGNOSIS — H2000 Unspecified acute and subacute iridocyclitis: Secondary | ICD-10-CM | POA: Diagnosis not present

## 2019-11-29 DIAGNOSIS — H18231 Secondary corneal edema, right eye: Secondary | ICD-10-CM | POA: Diagnosis not present

## 2019-11-29 DIAGNOSIS — R079 Chest pain, unspecified: Secondary | ICD-10-CM

## 2019-11-29 DIAGNOSIS — M47814 Spondylosis without myelopathy or radiculopathy, thoracic region: Secondary | ICD-10-CM | POA: Diagnosis not present

## 2019-11-29 DIAGNOSIS — H401431 Capsular glaucoma with pseudoexfoliation of lens, bilateral, mild stage: Secondary | ICD-10-CM | POA: Diagnosis not present

## 2019-11-29 LAB — CBC WITH DIFFERENTIAL/PLATELET
Basophils Absolute: 0 10*3/uL (ref 0.0–0.1)
Basophils Relative: 0.4 % (ref 0.0–3.0)
Eosinophils Absolute: 0.1 10*3/uL (ref 0.0–0.7)
Eosinophils Relative: 0.9 % (ref 0.0–5.0)
HCT: 43.7 % (ref 36.0–46.0)
Hemoglobin: 14.7 g/dL (ref 12.0–15.0)
Lymphocytes Relative: 15.9 % (ref 12.0–46.0)
Lymphs Abs: 1.8 10*3/uL (ref 0.7–4.0)
MCHC: 33.6 g/dL (ref 30.0–36.0)
MCV: 93.6 fl (ref 78.0–100.0)
Monocytes Absolute: 0.9 10*3/uL (ref 0.1–1.0)
Monocytes Relative: 8 % (ref 3.0–12.0)
Neutro Abs: 8.5 10*3/uL — ABNORMAL HIGH (ref 1.4–7.7)
Neutrophils Relative %: 74.8 % (ref 43.0–77.0)
Platelets: 289 10*3/uL (ref 150.0–400.0)
RBC: 4.67 Mil/uL (ref 3.87–5.11)
RDW: 14.6 % (ref 11.5–15.5)
WBC: 11.3 10*3/uL — ABNORMAL HIGH (ref 4.0–10.5)

## 2019-11-29 LAB — COMPREHENSIVE METABOLIC PANEL
ALT: 26 U/L (ref 0–35)
AST: 22 U/L (ref 0–37)
Albumin: 4.3 g/dL (ref 3.5–5.2)
Alkaline Phosphatase: 78 U/L (ref 39–117)
BUN: 13 mg/dL (ref 6–23)
CO2: 30 mEq/L (ref 19–32)
Calcium: 9.6 mg/dL (ref 8.4–10.5)
Chloride: 95 mEq/L — ABNORMAL LOW (ref 96–112)
Creatinine, Ser: 0.74 mg/dL (ref 0.40–1.20)
GFR: 76.75 mL/min (ref >60.00)
Glucose, Bld: 98 mg/dL (ref 70–99)
Potassium: 3.3 mEq/L — ABNORMAL LOW (ref 3.5–5.1)
Sodium: 138 mEq/L (ref 135–145)
Total Bilirubin: 0.9 mg/dL (ref 0.2–1.2)
Total Protein: 7.5 g/dL (ref 6.0–8.3)

## 2019-11-29 LAB — AMYLASE: Amylase: 38 U/L (ref 27–131)

## 2019-11-29 LAB — LIPASE: Lipase: 38 U/L (ref 11.0–59.0)

## 2019-11-29 NOTE — Progress Notes (Signed)
Jo Morrison is a 74 y.o. female with the following history as recorded in EpicCare:  Patient Active Problem List   Diagnosis Date Noted  . Chronic diarrhea 08/07/2019  . Low back pain 03/01/2019  . Routine general medical examination at a health care facility 08/16/2016  . Carpal tunnel syndrome, right 02/08/2016  . Ductal carcinoma in situ (DCIS) of left breast 10/28/2014  . Contracture of palmar fascia (Dupuytren's) 07/13/2014  . Hypertension   . Anxiety   . Asthma   . Osteoarthritis   . Glaucoma   . Hx of adenomatous colonic polyps 11/01/2012  . Breast cancer of lower-outer quadrant of right female breast ([place]) 10/12/2012    Current Outpatient Medications  Medication Sig Dispense Refill  . Albuterol Sulfate (PROAIR RESPICLICK) 623 (90 BASE) MCG/ACT AEPB Inhale 2 puffs into the lungs every 4 (four) hours as needed (SOB). 1 each 0  . Bromfenac Sodium (PROLENSA) 0.07 % SOLN Place 1 drop into the right eye 4 (four) times daily. 6 mL 3  . budesonide (PULMICORT) 180 MCG/ACT inhaler Inhale 2 puffs into the lungs 2 (two) times daily.    . clonazePAM (KLONOPIN) 0.5 MG tablet Take 1 tablet (0.5 mg total) by mouth at bedtime as needed. 30 tablet 0  . dorzolamide-timolol (COSOPT) 22.3-6.8 MG/ML ophthalmic solution Place 1 drop into the right eye 3 (three) times daily.    . fluorometholone (FML) 0.1 % ophthalmic suspension fluorometholone 0.1 % eye drops,suspension    . fluticasone (FLONASE) 50 MCG/ACT nasal spray Place into the nose.    . gabapentin (NEURONTIN) 100 MG capsule Take 100 mg by mouth at bedtime as needed. Patient takes 100 mg at night  3  . hydrochlorothiazide (HYDRODIURIL) 25 MG tablet TAKE 1 TABLET(25 MG) BY MOUTH DAILY 90 tablet 3  . Loteprednol Etabonate (INVELTYS) 1 % SUSP Place 1 drop into the right eye 4 (four) times daily. 2.8 mL 2  . methocarbamol (ROBAXIN) 500 MG tablet Take 1 tablet (500 mg total) by mouth every 8 (eight) hours as needed for muscle spasms. 60 tablet  0  . montelukast (SINGULAIR) 10 MG tablet TAKE 1 TABLET(10 MG) BY MOUTH AT BEDTIME 90 tablet 0  . pantoprazole (PROTONIX) 40 MG tablet Take 1 tablet (40 mg total) by mouth daily. 90 tablet 0  . REFRESH OPTIVE ADVANCED 0.5-1-0.5 % SOLN SMARTSIG:1 Drop(s) In Eye(s) As Needed    . traZODone (DESYREL) 100 MG tablet Take 1-2 tablets (100-200 mg total) by mouth at bedtime as needed for sleep. 180 tablet 3  . traZODone (DESYREL) 150 MG tablet Take 150 mg by mouth at bedtime.    . triamcinolone cream (KENALOG) 0.1 % Apply 1 application topically 2 (two) times daily. 100 g 0  . valACYclovir (VALTREX) 1000 MG tablet Take 1,000 mg by mouth 3 (three) times daily.    Marland Kitchen venlafaxine XR (EFFEXOR-XR) 150 MG 24 hr capsule TAKE 1 CAPSULE BY MOUTH EVERY MORNING 90 capsule 3  . FLUZONE HIGH-DOSE QUADRIVALENT 0.7 ML SUSY     . Influenza Vac High-Dose Quad (FLUZONE HIGH-DOSE QUADRIVALENT) 0.7 ML SUSY Fluzone High-Dose Quad 2020-21 (PF) 240 mcg/0.7 mL IM syringe  U UTD PER RPH     No current facility-administered medications for this visit.    Allergies: Latex  Past Medical History:  Diagnosis Date  . Allergy   . Anxiety   . Asthma   . Breast cancer ([facility]) 2012   left  . Breast cancer, right ([facility]) 1996   right  .  Cataract    cataracts removed  . Depressive disorder   . DVT (deep venous thrombosis) ([place])   . Glaucoma    not on eye drops at this time.  . Hypertension   . Olecranon bursitis   . Osteoarthritis   . Other disorders of bone and cartilage(733.99)   . Seasonal allergies   . Unspecified pruritic disorder     Past Surgical History:  Procedure Laterality Date  . APPENDECTOMY  1958  . BREAST LUMPECTOMY Right 1996   Dr. Corena Herter in [place]  . BREAST LUMPECTOMY Left 2011   Dr. Pleas Patricia  . cataract Bilateral   . CATARACT EXTRACTION Bilateral    Dr. Lucita Ferrara  . COLONOSCOPY  2011-last  . DUPUYTREN CONTRACTURE RELEASE    . LASIK Bilateral 2007  . POLYPECTOMY    . ROTATOR CUFF REPAIR   2007  . TONSILLECTOMY  1967    Family History  Problem Relation Age of Onset  . Stroke Father 72  . Heart disease Brother   . Colon cancer Maternal Aunt 63  . Macular degeneration Mother   . Colon polyps Neg Hx   . Esophageal cancer Neg Hx   . Rectal cancer Neg Hx   . Stomach cancer Neg Hx     Social History   Tobacco Use  . Smoking status: Never Smoker  . Smokeless tobacco: Never Used  Substance Use Topics  . Alcohol use: Yes    Alcohol/week: 25.0 standard drinks    Types: 25 Glasses of wine per week    Subjective:  Patient started with onset of reflux symptoms in September 2020; feels it was related to using Cipro; complaining of burning sensation in chest and under breast; symptoms are not related to food- food does not make it better or worse; describes as constant pain in chest; pain wakes her up at night; no relief with Pepcid or Protonix;  Is down 7 pounds since September- not actively trying; does still have her gallbladder;      Objective:  Vitals:   11/29/19 1433  BP: 126/84  Pulse: 100  Temp: 98.2 F (36.8 C)  TempSrc: Oral  SpO2: 99%  Weight: 122 lb 6.4 oz (55.5 kg)  Height: _0  (1.6 m)    General: Well developed, well nourished, in no acute distress  Skin : Warm and dry.  Head: Normocephalic and atraumatic  Eyes: Sclera and conjunctiva clear; pupils round and reactive to light; extraocular movements intact  Ears: External normal; canals clear; tympanic membranes normal  Oropharynx: Pink, supple. No suspicious lesions  Neck: Supple without thyromegaly, adenopathy  Lungs: Respirations unlabored; clear to auscultation bilaterally without wheeze, rales, rhonchi  CVS exam: normal rate and regular rhythm.  Abdomen: Soft; nontender; nondistended; normoactive bowel sounds; no masses or hepatosplenomegaly  Musculoskeletal: No deformities; no active joint inflammation  Neurologic: Alert and oriented; speech intact; face symmetrical; moves all extremities well;  CNII-XII intact without focal deficit   Assessment:  1. Abdominal pain, unspecified abdominal location   2. Chest pain, unspecified type     Plan:  Symptoms are not consistent with GERD and patient is not getting any relief from Protonix; okay to hold this medication; will update labs today and CXR/ thoracic X-ray; will most likely need abdominal imaging; if no source of symptoms found, to consider GI evaluation for endoscopy. [name] to be determined.   No follow-ups on file.  Orders Placed This Encounter  Procedures  . DG Chest 2 View  Standing Status:   Future    Number of Occurrences:   1    Standing Expiration Date:   01/26/2021    Order Specific Question:   Reason for Exam (SYMPTOM  OR DIAGNOSIS REQUIRED)    Answer:   chest pain    Order Specific Question:   Preferred imaging location?    Answer:   [facility]    Order Specific Question:   Radiology Contrast Protocol - do NOT remove file path    Answer:   [email or web address]  . CBC w/Diff  . Comp Met (CMET)  . Amylase  . Lipase  . CBC w/Diff    Standing Status:   Future    Number of Occurrences:   1    Standing Expiration Date:   11/28/2020  . Comp Met (CMET)    Standing Status:   Future    Number of Occurrences:   1    Standing Expiration Date:   11/28/2020  . Amylase    Standing Status:   Future    Number of Occurrences:   1    Standing Expiration Date:   11/28/2020  . Lipase    Standing Status:   Future    Number of Occurrences:   1    Standing Expiration Date:   11/28/2020    Requested Prescriptions    No prescriptions requested or ordered in this encounter

## 2019-11-30 ENCOUNTER — Other Ambulatory Visit: Payer: Self-pay | Admitting: Family

## 2019-11-30 DIAGNOSIS — R634 Abnormal weight loss: Secondary | ICD-10-CM

## 2019-11-30 DIAGNOSIS — R109 Unspecified abdominal pain: Secondary | ICD-10-CM

## 2019-12-01 DIAGNOSIS — M25641 Stiffness of right hand, not elsewhere classified: Secondary | ICD-10-CM | POA: Diagnosis not present

## 2019-12-05 ENCOUNTER — Ambulatory Visit: Payer: PPO

## 2019-12-05 ENCOUNTER — Ambulatory Visit: Payer: Medicare Other | Attending: Internal Medicine

## 2019-12-05 DIAGNOSIS — Z23 Encounter for immunization: Secondary | ICD-10-CM | POA: Insufficient documentation

## 2019-12-05 NOTE — Progress Notes (Signed)
   Covid-19 Vaccination Clinic  Name:  Jo Morrison    MRN: [id number] DOB: 27-Nov-1945  12/05/2019  Jo Morrison was observed post Covid-19 immunization for 15 minutes without incidence. She was provided with Vaccine Information Sheet and instruction to access the V-Safe system.   Jo Morrison was instructed to call 911 with any severe reactions post vaccine: Marland Kitchen Difficulty breathing  . Swelling of your face and throat  . A fast heartbeat  . A bad rash all over your body  . Dizziness and weakness    Immunizations Administered    Name Date Dose VIS Date Route   Pfizer COVID-19 Vaccine 12/05/2019  1:33 PM 0.3 mL 11/05/2019 Intramuscular   Manufacturer: [employer], [employer]   Lot: [id number]   [place]: [id number]

## 2019-12-06 ENCOUNTER — Other Ambulatory Visit: Payer: Self-pay | Admitting: Hematology and Oncology

## 2019-12-06 ENCOUNTER — Other Ambulatory Visit: Payer: Self-pay | Admitting: Internal Medicine

## 2019-12-06 DIAGNOSIS — Z1231 Encounter for screening mammogram for malignant neoplasm of breast: Secondary | ICD-10-CM

## 2019-12-09 ENCOUNTER — Ambulatory Visit
Admission: RE | Admit: 2019-12-09 | Discharge: 2019-12-09 | Disposition: A | Discharge status: Home / Self Care | Payer: Medicare Other | Source: Ambulatory Visit | Attending: Family | Admitting: Family

## 2019-12-09 DIAGNOSIS — R634 Abnormal weight loss: Secondary | ICD-10-CM

## 2019-12-09 DIAGNOSIS — K760 Fatty (change of) liver, not elsewhere classified: Secondary | ICD-10-CM | POA: Diagnosis not present

## 2019-12-09 DIAGNOSIS — R109 Unspecified abdominal pain: Secondary | ICD-10-CM

## 2019-12-09 DIAGNOSIS — M25641 Stiffness of right hand, not elsewhere classified: Secondary | ICD-10-CM | POA: Diagnosis not present

## 2019-12-09 MED ORDER — IOPAMIDOL (ISOVUE-300) INJECTION 61%
100.0000 mL | Freq: Once | INTRAVENOUS | Status: AC | PRN
  Administered 2019-12-09: 15:00:00 100 mL via INTRAVENOUS

## 2019-12-10 ENCOUNTER — Other Ambulatory Visit: Payer: Self-pay | Admitting: Family

## 2019-12-10 DIAGNOSIS — Z9889 Other specified postprocedural states: Secondary | ICD-10-CM | POA: Diagnosis not present

## 2019-12-10 DIAGNOSIS — Z961 Presence of intraocular lens: Secondary | ICD-10-CM | POA: Diagnosis not present

## 2019-12-10 DIAGNOSIS — H2011 Chronic iridocyclitis, right eye: Secondary | ICD-10-CM | POA: Diagnosis not present

## 2019-12-10 DIAGNOSIS — K219 Gastro-esophageal reflux disease without esophagitis: Secondary | ICD-10-CM

## 2019-12-10 DIAGNOSIS — H401414 Capsular glaucoma with pseudoexfoliation of lens, right eye, indeterminate stage: Secondary | ICD-10-CM | POA: Diagnosis not present

## 2019-12-10 DIAGNOSIS — R634 Abnormal weight loss: Secondary | ICD-10-CM

## 2019-12-10 DIAGNOSIS — H18231 Secondary corneal edema, right eye: Secondary | ICD-10-CM | POA: Diagnosis not present

## 2019-12-13 ENCOUNTER — Encounter: Payer: Self-pay | Admitting: Internal Medicine

## 2019-12-14 ENCOUNTER — Encounter: Payer: Self-pay | Admitting: Internal Medicine

## 2019-12-14 ENCOUNTER — Telehealth: Payer: Self-pay

## 2019-12-14 ENCOUNTER — Telehealth: Payer: Medicare Other | Admitting: Physician Assistant

## 2019-12-14 ENCOUNTER — Encounter: Payer: Self-pay | Admitting: Family Medicine

## 2019-12-14 ENCOUNTER — Other Ambulatory Visit: Payer: Self-pay

## 2019-12-14 ENCOUNTER — Ambulatory Visit: Payer: Medicare Other | Admitting: Family

## 2019-12-14 ENCOUNTER — Ambulatory Visit (INDEPENDENT_AMBULATORY_CARE_PROVIDER_SITE_OTHER): Payer: PPO | Admitting: Family Medicine

## 2019-12-14 VITALS — BP 132/94 | HR 81 | Ht 63.0 in | Wt 123.2 lb

## 2019-12-14 DIAGNOSIS — M546 Pain in thoracic spine: Secondary | ICD-10-CM

## 2019-12-14 DIAGNOSIS — R079 Chest pain, unspecified: Secondary | ICD-10-CM | POA: Diagnosis not present

## 2019-12-14 DIAGNOSIS — M549 Dorsalgia, unspecified: Secondary | ICD-10-CM

## 2019-12-14 NOTE — Progress Notes (Signed)
Subjective:    I'm seeing this patient as a consultation for:  Dr. Sharlet Salina. Note will be routed back to referring provider/PCP.  CC: Mid-upper back pain  I, Molly Weber, LAT, ATC, am serving as scribe for Dr. Lynne Leader.  HPI: Pt is a 74 y/o female presenting w/ c/o mid-upper back pain x approximately 3 months. She was initially seen for indigestion and chest pain w/ swallowing and was seen for acid reflux.  She notes the pain started suddenly about September 2020.  However, none of those medications helped w/ her symptoms.  She rates her pain at a 7-8/10 and describes her pain as aching.  She notes that taking 2 Aleve will take the edge off so she can sleep  Radiating pain: No Pain into arms or legs: No Numbness/tingling: In R hand that is unrelated Aggravating factors:  Eating and pressure to her spine ie when sitting w/ her back against a chair Treatments tried: Naprosyn '500mg'$  bid; Flexeril 10 mg q 8 hrs that were prescribed during her Evisit however she has not started them yet as she wanted to make sure they were okay.  She denies pain with deep inspiration or with exertion.  Pain is worse with swallowing and with pressure at her thoracic spine.  She denies any pain with arm motion.  No pain with lifting.  Past medical history, Surgical history, Family history, Social history, Allergies, and medications have been entered into the medical record, reviewed.   Review of Systems: No new headache, visual changes, nausea, vomiting, diarrhea, constipation, dizziness, abdominal pain, skin rash, fevers, chills, night sweats, weight loss, swollen lymph nodes, body aches, joint swelling, muscle aches, chest pain, shortness of breath, mood changes, visual or auditory hallucinations.   Objective:    Vitals:   12/14/19 1258  BP: (!) 132/94  Pulse: 81  SpO2: 99%   General: Well Developed, well nourished, and in no acute distress.  Neuro/Psych: Alert and oriented x3, extra-ocular muscles  intact, able to move all 4 extremities, sensation grossly intact. Skin: Warm and dry, no rashes noted.  Respiratory: Not using accessory muscles, speaking in full sentences, trachea midline.  Clear to auscultation bilaterally Cardiovascular: Pulses palpable, no extremity edema.  No murmurs rubs or gallops Abdomen: Does not appear distended.  Normal active bowel sounds soft and nontender. MSK:  T-spine: Curvature at lower thoracic spine and lumbar spine. Otherwise normal-appearing. Mildly tender palpation mid thoracic midline and left thoracic paraspinal musculature. Normal upper extremity motion. Normal thoracic motion.   Lab and Radiology Results  .EXAM: CT ABDOMEN AND PELVIS WITH CONTRAST  TECHNIQUE: Multidetector CT imaging of the abdomen and pelvis was performed using the standard protocol following bolus administration of intravenous contrast.  CONTRAST:  174m ISOVUE-300 IOPAMIDOL (ISOVUE-300) INJECTION 61%, additional oral enteric contrast  COMPARISON:  None.  FINDINGS: Lower chest: No acute abnormality.  Hepatobiliary: No solid liver abnormality is seen. Hepatic steatosis. Contracted gallbladder. No gallstones, gallbladder wall thickening, or biliary dilatation.  Pancreas: Unremarkable. No pancreatic ductal dilatation or surrounding inflammatory changes.  Spleen: Normal in size without significant abnormality.  Adrenals/Urinary Tract: Adrenal glands are unremarkable. Kidneys are normal, without renal calculi, solid lesion, or hydronephrosis. Bladder is unremarkable.  Stomach/Bowel: Stomach is within normal limits. Appendix is surgically absent. No evidence of bowel wall thickening, distention, or inflammatory changes.  Vascular/Lymphatic: Aortic atherosclerosis. No enlarged abdominal or pelvic lymph nodes.  Reproductive: Partially calcified uterine fibroids.  Other: No abdominal wall hernia or abnormality. No  abdominopelvic ascites.  Musculoskeletal: No acute or significant osseous findings. Levoscoliosis of the thoracolumbar spine.  IMPRESSION: 1. No CT findings of the abdomen or pelvis to explain pain or weight loss. 2. Hepatic steatosis. 3. Uterine fibroids. 4. Aortic Atherosclerosis (ICD10-I70.0).   Electronically Signed   By: Eddie Candle M.D.   On: 12/09/2019 15:38  EXAM: THORACIC SPINE - 3 VIEWS  COMPARISON:  None.  FINDINGS: There is an S-shaped curvature of the thoracolumbar spine. Mild-to-moderate multilevel degenerative changes are noted throughout the visualized thoracic spine. There is no obvious displaced fracture or dislocation. Degenerative changes are noted of the lower cervical spine.  IMPRESSION: Thoracolumbar scoliosis with multilevel degenerative changes. No obvious displaced fracture or dislocation of the thoracic spine.   Electronically Signed   By: Constance Holster M.D.   On: 11/29/2019 19:58  EXAM: CHEST - 2 VIEW  COMPARISON:  December 03, 2017.  FINDINGS: The heart size and mediastinal contours are within normal limits. Both lungs are clear. The visualized skeletal structures are unremarkable. Aortic calcifications are noted. There is an S-shaped curvature of the thoracolumbar spine.  IMPRESSION: No active cardiopulmonary disease.   Electronically Signed   By: Constance Holster M.D.   On: 11/29/2019 19:57  I, Lynne Leader, personally (independently) visualized and performed the interpretation of the images attached in this note.   Recent Results (from the past 2160 hour(s))  Comprehensive metabolic panel     Status: Abnormal   Collection Time: 09/24/19 11:06 AM  Result Value Ref Range   Sodium 141 135 - 145 mEq/L   Potassium 3.2 (L) 3.5 - 5.1 mEq/L   Chloride 98 96 - 112 mEq/L   CO2 33 (H) 19 - 32 mEq/L   Glucose, Bld 107 (H) 70 - 99 mg/dL   BUN 13 6 - 23 mg/dL   Creatinine, Ser 0.77 0.40 - 1.20 mg/dL   Total  Bilirubin 0.8 0.2 - 1.2 mg/dL   Alkaline Phosphatase 74 39 - 117 U/L   AST 29 0 - 37 U/L   ALT 34 0 - 35 U/L   Total Protein 7.4 6.0 - 8.3 g/dL   Albumin 4.3 3.5 - 5.2 g/dL   Calcium 9.4 8.4 - 10.5 mg/dL   GFR 73.35 >60.00 mL/min  Lipid panel     Status: None   Collection Time: 09/24/19 11:06 AM  Result Value Ref Range   Cholesterol 179 0 - 200 mg/dL    Comment: ATP III Classification       Desirable:  < 200 mg/dL               Borderline High:  200 - 239 mg/dL          High:  > = 240 mg/dL   Triglycerides 102.0 0.0 - 149.0 mg/dL    Comment: Normal:  <150 mg/dLBorderline High:  150 - 199 mg/dL   HDL 61.90 >39.00 mg/dL   VLDL 20.4 0.0 - 40.0 mg/dL   LDL Cholesterol 96 0 - 99 mg/dL   Total CHOL/HDL Ratio 3     Comment:                Men          Women1/2 Average Risk     3.4          3.3Average Risk          5.0          4.42X Average Risk          9.6  7.13X Average Risk          15.0          11.0                       NonHDL 116.85     Comment: NOTE:  Non-HDL goal should be 30 mg/dL higher than patient's LDL goal (i.e. LDL goal of < 70 mg/dL, would have non-HDL goal of < 100 mg/dL)  CBC     Status: None   Collection Time: 09/24/19 11:06 AM  Result Value Ref Range   WBC 8.1 4.0 - 10.5 K/uL   RBC 4.63 3.87 - 5.11 Mil/uL   Platelets 252.0 150.0 - 400.0 K/uL   Hemoglobin 14.3 12.0 - 15.0 g/dL   HCT 43.3 36.0 - 46.0 %   MCV 93.5 78.0 - 100.0 fl   MCHC 33.1 30.0 - 36.0 g/dL   RDW 13.3 11.5 - 15.5 %  Lipase     Status: None   Collection Time: 11/29/19  3:14 PM  Result Value Ref Range   Lipase 38.0 11.0 - 59.0 U/L  Amylase     Status: None   Collection Time: 11/29/19  3:14 PM  Result Value Ref Range   Amylase 38 27 - 131 U/L  Comp Met (CMET)     Status: Abnormal   Collection Time: 11/29/19  3:14 PM  Result Value Ref Range   Sodium 138 135 - 145 mEq/L   Potassium 3.3 (L) 3.5 - 5.1 mEq/L   Chloride 95 (L) 96 - 112 mEq/L   CO2 30 19 - 32 mEq/L   Glucose, Bld 98 70 - 99  mg/dL   BUN 13 6 - 23 mg/dL   Creatinine, Ser 0.74 0.40 - 1.20 mg/dL   Total Bilirubin 0.9 0.2 - 1.2 mg/dL   Alkaline Phosphatase 78 39 - 117 U/L   AST 22 0 - 37 U/L   ALT 26 0 - 35 U/L   Total Protein 7.5 6.0 - 8.3 g/dL   Albumin 4.3 3.5 - 5.2 g/dL   GFR 76.75 >60.00 mL/min   Calcium 9.6 8.4 - 10.5 mg/dL  CBC w/Diff     Status: Abnormal   Collection Time: 11/29/19  3:14 PM  Result Value Ref Range   WBC 11.3 (H) 4.0 - 10.5 K/uL   RBC 4.67 3.87 - 5.11 Mil/uL   Hemoglobin 14.7 12.0 - 15.0 g/dL   HCT 43.7 36.0 - 46.0 %   MCV 93.6 78.0 - 100.0 fl   MCHC 33.6 30.0 - 36.0 g/dL   RDW 14.6 11.5 - 15.5 %   Platelets 289.0 150.0 - 400.0 K/uL   Neutrophils Relative % 74.8 43.0 - 77.0 %   Lymphocytes Relative 15.9 12.0 - 46.0 %   Monocytes Relative 8.0 3.0 - 12.0 %   Eosinophils Relative 0.9 0.0 - 5.0 %   Basophils Relative 0.4 0.0 - 3.0 %   Neutro Abs 8.5 (H) 1.4 - 7.7 K/uL   Lymphs Abs 1.8 0.7 - 4.0 K/uL   Monocytes Absolute 0.9 0.1 - 1.0 K/uL   Eosinophils Absolute 0.1 0.0 - 0.7 K/uL   Basophils Absolute 0.0 0.0 - 0.1 K/uL     Impression and Recommendations:    Assessment and Plan: 74 y.o. female with thoracic back pain and chest pain.  Occurring since September 2020 without obvious injury.  Pain in chest occurs with swallow and pain in thoracic spine occurs with pressure at her thoracic spine.  She notes often the thoracic spine pain and the chest pain occur together.  She has had work-up so far including laboratory assessment as above as well as chest x-ray, T-spine x-ray, and CT scan abdomen and pelvis with contrast.  Although she does have degenerative changes in her spine there is no obvious cause with the imaging available today to explain her pain.  At this time because the pain remains uncertain..  Differential includes radiographically occult thoracic compression fracture, aortic aneurysm not seen well on chest x-ray, esophageal or stomach gastritis ulcer erosions etc. or  cardiogenic cause.  Plan to proceed with chest CT scan without contrast.  This will evaluate for compression fracture at the thoracic spine as well as lung parenchymal issue, or aortic aneurysm.  Additionally patient has in a referral scheduled for mid February with gastroenterology likely will proceed with EGD which should also be helpful.  We will follow-up after CT scan and gastroenterology referral.  We will proceed with further work-up sooner if needed.  Case and situation discussed with patient who expresses understanding and agreement.   Orders Placed This Encounter  Procedures  . CT CHEST WO CONTRAST    Standing Status:   Future    Standing Expiration Date:   02/10/2021    Order Specific Question:   ** REASON FOR EXAM (FREE TEXT)    Answer:   4 months back and chest pain. Eval poss compression fx vs aortic ansurysm vs espohageal issue    Order Specific Question:   Preferred imaging location?    Answer:   [id number]    Order Specific Question:   Radiology Contrast Protocol - do NOT remove file path    Answer:   [email or web address]   No orders of the defined types were placed in this encounter.   Discussed warning signs or symptoms. Please see discharge instructions. Patient expresses understanding.   The above documentation has been reviewed and is accurate and complete Lynne Leader

## 2019-12-14 NOTE — Telephone Encounter (Signed)
Spoke with patient and info given. She has an appointment to see Dr. Georgina Snell today. She has also been told to follow up with Dr. Sharlet Salina regarding this chronic matter.

## 2019-12-14 NOTE — Telephone Encounter (Signed)
Documentation provided.

## 2019-12-14 NOTE — Progress Notes (Signed)
We are sorry that you are not feeling well.  Here is how we plan to help!  Based on what you have shared with me it looks like you mostly have  back pain.  However, we cannot be [id number] certain since we have not been able to evaluate you in person. If you develop chest pain that does not go away, or shortness of breath you need to be seen in the Emergency department right away.   back pain is defined as musculoskeletal pain that can resolve with conservative treatment.  I see that you have also had loss of appetite, weight loss and fatigue. These symptoms usually are from another cause than musculoskeletal back pain. Since you also have a history of cancer, you need to schedule and in person visit with your provider as soon as possible.   I have prescribed Naprosyn 500 mg take one by mouth twice a day non-steroid anti-inflammatory (NSAID) as well as Flexeril 10 mg every eight hours as needed which is a muscle relaxer    If these do not help then please be seen IN PERSON at your primary care, urgent care or emergency department right away.   Some patients experience stomach irritation or in increased heartburn with anti-inflammatory drugs.  Please keep in mind that muscle relaxer's can cause fatigue and should not be taken while at work or driving.  Back pain is very common.  The pain often gets better over time.  The cause of back pain is usually not dangerous.  Most people can learn to manage their back pain on their own.  Home Care  Stay active.  Start with short walks on flat ground if you can.  Try to walk farther each day.  Do not sit, drive or stand in one place for more than 30 minutes.  Do not stay in bed.  Do not avoid exercise or work.  Activity can help your back heal faster.  Be careful when you bend or lift an object.  Bend at your knees, keep the object close to you, and do not twist.  Sleep on a firm mattress.  Lie on your side, and bend your knees.  If you lie on your  back, put a pillow under your knees.  Only take medicines as told by your doctor.  Put ice on the injured area.  Put ice in a plastic bag  Place a towel between your skin and the bag  Leave the ice on for 15-20 minutes, 3-4 times a day for the first 2-3 days. 210 After that, you can switch between ice and heat packs.  Ask your doctor about back exercises or massage.  Avoid feeling anxious or stressed.  Find good ways to deal with stress, such as exercise.  Get Help Right Way If:  Your pain does not go away with rest or medicine.  Your pain does not go away in 1 week.  You have new problems.  You do not feel well.  The pain spreads into your legs.  You cannot control when you poop (bowel movement) or pee (urinate)  You feel sick to your stomach (nauseous) or throw up (vomit)  You have belly (abdominal) pain.  You feel like you may pass out (faint).  If you develop a fever.  Make Sure you:  Understand these instructions.  Will watch your condition  Will get help right away if you are not doing well or get worse.  Your e-visit answers were reviewed by a  board certified advanced clinical practitioner to complete your personal care plan.  Depending on the condition, your plan could have included both over the counter or prescription medications.  If there is a problem please reply  once you have received a response from your provider.  Your safety is important to [place].  If you have drug allergies check your prescription carefully.    You can use MyChart to ask questions about today's visit, request a non-urgent call back, or ask for a work or school excuse for 24 hours related to this e-Visit. If it has been greater than 24 hours you will need to follow up with your provider, or enter a new e-Visit to address those concerns.  You will get an e-mail in the next two days asking about your experience.  I hope that your e-visit has been valuable and will speed your  recovery. Thank you for using e-visits.

## 2019-12-14 NOTE — Telephone Encounter (Signed)
Please ask her to come see our sports medicine provider just to make sure we are not missing a MSK source of the pain. Her X-rays did show scoliosis and I am wondering if this could somehow be contributing. I am going to call in Gabapentin for her to try at night to see if it helps.  She might also want to see if Dr. Carlean Purl has a cancellation list and she can get on this to be seen sooner.   She could also schedule to talk to Dr. Sharlet Salina ( virtual or in person) since this has been going on for 6+ months and they have not discussed.

## 2019-12-14 NOTE — Patient Instructions (Addendum)
Thank you for coming in today. I agree with gastroenterology evaluation.  Plan for CT scan of the chest.  I will get the results to you ASAP.   Assuming everything looks normal we will likely try some PT and possibly get a Tspine MRI.   Keep me updated.

## 2019-12-16 NOTE — Addendum Note (Signed)
Addended by: Douglass Rivers T on: 12/16/2019 02:01 PM   Modules accepted: Orders

## 2019-12-19 ENCOUNTER — Encounter: Payer: Self-pay | Admitting: Internal Medicine

## 2019-12-21 ENCOUNTER — Encounter: Payer: Self-pay | Admitting: Family Medicine

## 2019-12-26 ENCOUNTER — Ambulatory Visit: Payer: PPO | Attending: Internal Medicine

## 2019-12-26 DIAGNOSIS — Z23 Encounter for immunization: Secondary | ICD-10-CM | POA: Insufficient documentation

## 2019-12-26 NOTE — Progress Notes (Signed)
   Covid-19 Vaccination Clinic  Name:  Jo Morrison    MRN: [id number] DOB: 20-Nov-1946  12/26/2019  [name] was observed post Covid-19 immunization for 15 minutes without incidence. She was provided with Vaccine Information Sheet and instruction to access the V-Safe system.   [name] was instructed to call 911 with any severe reactions post vaccine: Marland Kitchen Difficulty breathing  . Swelling of your face and throat  . A fast heartbeat  . A bad rash all over your body  . Dizziness and weakness    Immunizations Administered    Name Date Dose VIS Date Route   Pfizer COVID-19 Vaccine 12/26/2019 11:15 AM 0.3 mL 11/05/2019 Intramuscular   Manufacturer: [place]   Lot: [id number]   [place]: [id number]

## 2019-12-29 ENCOUNTER — Other Ambulatory Visit: Payer: Self-pay | Admitting: Internal Medicine

## 2019-12-29 NOTE — Telephone Encounter (Signed)
Please refill as per office routine med refill policy (all routine meds refilled for 3 mo or monthly per pt preference up to one year from last visit, then month to month grace period for 3 mo, then further med refills will have to be denied)  

## 2019-12-30 ENCOUNTER — Ambulatory Visit
Admission: RE | Admit: 2019-12-30 | Discharge: 2019-12-30 | Disposition: A | Discharge status: Home / Self Care | Payer: PPO | Source: Ambulatory Visit | Attending: Family Medicine | Admitting: Family Medicine

## 2019-12-30 DIAGNOSIS — R079 Chest pain, unspecified: Secondary | ICD-10-CM

## 2019-12-30 DIAGNOSIS — R0602 Shortness of breath: Secondary | ICD-10-CM | POA: Diagnosis not present

## 2019-12-30 NOTE — Progress Notes (Signed)
CT scan of chest showed no severe lung or heart problems.There is lots of arthritis and degenerative changes in the spine.Also the joint where the collarbone meets the chest on the right side looks quite arthritic. Recheck either soon or following gastroenterology visit on February 16.

## 2020-01-03 ENCOUNTER — Other Ambulatory Visit: Payer: PPO

## 2020-01-11 ENCOUNTER — Encounter: Payer: Self-pay | Admitting: Internal Medicine

## 2020-01-11 ENCOUNTER — Ambulatory Visit (INDEPENDENT_AMBULATORY_CARE_PROVIDER_SITE_OTHER): Payer: PPO | Admitting: Internal Medicine

## 2020-01-11 VITALS — BP 122/72 | HR 72 | Temp 97.8°F | Ht 63.0 in | Wt 121.0 lb

## 2020-01-11 DIAGNOSIS — R1310 Dysphagia, unspecified: Secondary | ICD-10-CM | POA: Diagnosis not present

## 2020-01-11 NOTE — Patient Instructions (Signed)
  You have been scheduled for an endoscopy. Please follow written instructions given to you at your visit today. If you use inhalers (even only as needed), please bring them with you on the day of your procedure.   I appreciate the opportunity to care for you. Carl Gessner, MD, FACG 

## 2020-01-11 NOTE — Progress Notes (Signed)
Jo Morrison 74 y.o. 08-30-1946 [id number]  Assessment & Plan:   Encounter Diagnosis  Name Primary?  . Odynophagia Yes   Cause of this is not clear.  She is lost a little bit of weight.  Reflux esophagitis infectious esophagitis seem to be most likely, dysmotility is possible.  Cancer does not typically present this way but needs to be excluded.  We will schedule for an upper GI endoscopy on February 19.  She has an ophthalmology appointment late that morning which is important as well because she is having some significant eye issues so I think she should be able to make both of these appointments but if she is delayed getting to [place] we will accommodate her.  I have instructed her to call if she is running behind schedule.  I appreciate the opportunity to care for this patient. I will send a copy to Jo Mourning, NP  Subjective:   Chief Complaint: painful swallowing  HPI 74 year old white woman here with several month history of painful swallowing.  She will have pain that radiates into the back as well.  Interscapular.  CT of the chest and CT of the abdomen and pelvis in February and January respectively have been unrevealing.  She does have some scoliosis issues and some arthritis issues.  She is lost about 5 pounds over all she thinks though none recently, see weights below.  She is not had problems like this before.  She was on pantoprazole but stopped it because it did not help.  I had seen her in September with fecal incontinence and bowel habit issues and she had a very successful therapy with Jo Morrison of pelvic floor physical therapy and that is better.  She said she was having the symptoms at the time but thought it was "just heartburn" and did not mention it.  Wt Readings from Last 3 Encounters:  01/11/20 121 lb (54.9 kg)  12/14/19 123 lb 3.2 oz (55.9 kg)  11/29/19 122 lb 6.4 oz (55.5 kg)    Allergies  Allergen Reactions  . Latex Shortness Of Breath   Current Meds    Medication Sig  . Albuterol Sulfate (PROAIR RESPICLICK) [id number] (90 BASE) MCG/ACT AEPB Inhale 2 puffs into the lungs every 4 (four) hours as needed (SOB).  . Bromfenac Sodium (PROLENSA) 0.07 % SOLN Place 1 drop into the right eye 4 (four) times daily.  . budesonide (PULMICORT) 180 MCG/ACT inhaler Inhale 2 puffs into the lungs 2 (two) times daily.  . clonazePAM (KLONOPIN) 0.5 MG tablet Take 1 tablet (0.5 mg total) by mouth at bedtime as needed.  . dorzolamide-timolol (COSOPT) 22.3-6.8 MG/ML ophthalmic solution Place 1 drop into the right eye 3 (three) times daily.  . fluorometholone (FML) 0.1 % ophthalmic suspension fluorometholone 0.1 % eye drops,suspension  . fluticasone (FLONASE) 50 MCG/ACT nasal spray Place into the nose.  Marland Kitchen FLUZONE HIGH-DOSE QUADRIVALENT 0.7 ML SUSY   . gabapentin (NEURONTIN) 100 MG capsule Take 100 mg by mouth at bedtime as needed. Patient takes 100 mg at night  . hydrochlorothiazide (HYDRODIURIL) 25 MG tablet TAKE 1 TABLET(25 MG) BY MOUTH DAILY  . Influenza Vac High-Dose Quad (FLUZONE HIGH-DOSE QUADRIVALENT) 0.7 ML SUSY Fluzone High-Dose Quad 2020-21 (PF) 240 mcg/0.7 mL IM syringe  U UTD PER RPH  . methocarbamol (ROBAXIN) 500 MG tablet Take 1 tablet (500 mg total) by mouth every 8 (eight) hours as needed for muscle spasms.  . montelukast (SINGULAIR) 10 MG tablet TAKE 1 TABLET(10 MG) BY MOUTH AT  BEDTIME  . REFRESH OPTIVE ADVANCED 0.5-1-0.5 % SOLN SMARTSIG:1 Drop(s) In Eye(s) As Needed  . traZODone (DESYREL) 100 MG tablet Take 1-2 tablets (100-200 mg total) by mouth at bedtime as needed for sleep.  . traZODone (DESYREL) 150 MG tablet Take 150 mg by mouth at bedtime.  . triamcinolone cream (KENALOG) 0.1 % Apply 1 application topically 2 (two) times daily.  . valACYclovir (VALTREX) 1000 MG tablet Take 1,000 mg by mouth 3 (three) times daily.  Marland Kitchen venlafaxine XR (EFFEXOR-XR) 150 MG 24 hr capsule TAKE 1 CAPSULE BY MOUTH EVERY MORNING  . [DISCONTINUED] pantoprazole (PROTONIX) 40 MG  tablet TAKE 1 TABLET(40 MG) BY MOUTH DAILY   Past Medical History:  Diagnosis Date  . Allergy   . Anxiety   . Asthma   . Breast cancer ([place]) 2012   left  . Breast cancer, right ([place]) 1996   right  . Cataract    cataracts removed  . Chronic uveitis of right eye   . Corneal edema of right eye   . Cystoid macular edema of right eye   . Depressive disorder   . DVT (deep venous thrombosis) ([place])   . Glaucoma    not on eye drops at this time.  . Hypertension   . Olecranon bursitis   . Osteoarthritis   . Other disorders of bone and cartilage(733.99)   . Pseudoexfoliation (PXF) glaucoma of both eyes   . Seasonal allergies   . Unspecified pruritic disorder    Past Surgical History:  Procedure Laterality Date  . APPENDECTOMY  1958  . BREAST LUMPECTOMY Right 1996   Dr. Corena Morrison in [place]  . BREAST LUMPECTOMY Left 2011   Dr. Pleas Morrison  . cataract Bilateral   . CATARACT EXTRACTION Bilateral    Dr. Lucita Morrison  . COLONOSCOPY  2011-last  . DUPUYTREN CONTRACTURE RELEASE    . HAND SURGERY Right   . LASIK Bilateral 2007  . POLYPECTOMY    . ROTATOR CUFF REPAIR  2007  . TONSILLECTOMY  10   Social History   Social History Narrative   Married no children   Wine 3 to 4 glasses a day   Never smoker   No drug use   family history includes Colon cancer (age of onset: 59) in her maternal aunt; Heart disease in her brother; Macular degeneration in her mother; Stroke (age of onset: 48) in her father.   Review of Systems As above  Objective:   Physical Exam BP 122/72   Pulse 72   Temp 97.8 F (36.6 C)   Ht 5\' 3"  (1.6 m)   Wt 121 lb (54.9 kg)   BMI 21.43 kg/m  Lungs cta Cor NL abd soft and NT

## 2020-01-12 ENCOUNTER — Other Ambulatory Visit: Payer: Self-pay

## 2020-01-12 ENCOUNTER — Encounter: Payer: Self-pay | Admitting: Internal Medicine

## 2020-01-12 ENCOUNTER — Ambulatory Visit
Admission: RE | Admit: 2020-01-12 | Discharge: 2020-01-12 | Disposition: A | Discharge status: Home / Self Care | Payer: PPO | Source: Ambulatory Visit | Attending: Hematology and Oncology | Admitting: Hematology and Oncology

## 2020-01-12 DIAGNOSIS — Z1231 Encounter for screening mammogram for malignant neoplasm of breast: Secondary | ICD-10-CM | POA: Diagnosis not present

## 2020-01-14 ENCOUNTER — Other Ambulatory Visit: Payer: Self-pay

## 2020-01-14 ENCOUNTER — Ambulatory Visit (AMBULATORY_SURGERY_CENTER): Payer: PPO | Admitting: Internal Medicine

## 2020-01-14 ENCOUNTER — Other Ambulatory Visit: Payer: Self-pay | Admitting: Physician Assistant

## 2020-01-14 ENCOUNTER — Encounter: Payer: Self-pay | Admitting: Internal Medicine

## 2020-01-14 VITALS — BP 183/86 | HR 89 | Temp 97.5°F | Resp 20 | Ht 63.0 in | Wt 121.0 lb

## 2020-01-14 DIAGNOSIS — I10 Essential (primary) hypertension: Secondary | ICD-10-CM | POA: Diagnosis not present

## 2020-01-14 DIAGNOSIS — C159 Malignant neoplasm of esophagus, unspecified: Secondary | ICD-10-CM | POA: Diagnosis not present

## 2020-01-14 DIAGNOSIS — K228 Other specified diseases of esophagus: Secondary | ICD-10-CM | POA: Diagnosis not present

## 2020-01-14 DIAGNOSIS — R1310 Dysphagia, unspecified: Secondary | ICD-10-CM | POA: Diagnosis not present

## 2020-01-14 DIAGNOSIS — K2289 Other specified disease of esophagus: Secondary | ICD-10-CM

## 2020-01-14 DIAGNOSIS — C158 Malignant neoplasm of overlapping sites of esophagus: Secondary | ICD-10-CM | POA: Diagnosis not present

## 2020-01-14 MED ORDER — OXYCODONE HCL 5 MG PO CAPS: 5.0000 mg | ORAL_CAPSULE | ORAL | 0 refills | Status: DC | PRN

## 2020-01-14 MED ORDER — SODIUM CHLORIDE 0.9 % IV SOLN: 500.0000 mL | Freq: Once | INTRAVENOUS | Status: DC

## 2020-01-14 NOTE — Op Note (Addendum)
[facility] Patient Name: Jo Morrison Procedure Date: 01/14/2020 2:29 PM MRN: [id number] Endoscopist: Gatha Mayer , MD Age: 74 Referring MD:  Date of Birth: November 23, 1946 Gender: Female Account #: [id number] Procedure:                Upper GI endoscopy Indications:              Odynophagia Medicines:                Propofol per Anesthesia, Monitored Anesthesia Care Procedure:                Pre-Anesthesia Assessment:                           - Prior to the procedure, a History and Physical                            was performed, and patient medications and                            allergies were reviewed. The patient's tolerance of                            previous anesthesia was also reviewed. The risks                            and benefits of the procedure and the sedation                            options and risks were discussed with the patient.                            All questions were answered, and informed consent                            was obtained. Prior Anticoagulants: The patient has                            taken no previous anticoagulant or antiplatelet                            agents. ASA Grade Assessment: II - A patient with                            mild systemic disease. After reviewing the risks                            and benefits, the patient was deemed in                            satisfactory condition to undergo the procedure.                           After obtaining informed consent, the endoscope was  passed under direct vision. Throughout the                            procedure, the patient's blood pressure, pulse, and                            oxygen saturations were monitored continuously. The                            Endoscope was introduced through the mouth, and                            advanced to the second part of duodenum. The upper                            GI endoscopy was  accomplished without difficulty.                            The patient tolerated the procedure well. Scope In: Scope Out: Findings:                 A large, ulcerating mass was found in the proximal                            esophagus and in the mid esophagus, 26 to 31 cm                            from the incisors. The mass was partially                            obstructing and partially circumferential                            (involving two thirds of the lumen circumference).                            Biopsies were taken with a cold forceps for                            histology. Verification of patient identification                            for the specimen was done. Estimated blood loss was                            minimal.                           The exam was otherwise without abnormality.                           The cardia and gastric fundus were normal on  retroflexion. Complications:            No immediate complications. Estimated Blood Loss:     Estimated blood loss was minimal. Impression:               - Partially obstructing, likely malignant                            esophageal tumor was found in the proximal                            esophagus and in the mid esophagus. Biopsied.                           - The examination was otherwise normal. Recommendation:           - Patient has a contact number available for                            emergencies. The signs and symptoms of potential                            delayed complications were discussed with the                            patient. Return to normal activities tomorrow.                            Written discharge instructions were provided to the                            patient.                           - Soft diet.                           - Continue present medications.                           - Await pathology results.                           - Once  we know path results will determine next                            steps.                           CT chest w/o contrast negative earlier this month                            and CT and/pelvis with contrast negative in Jan 2021                           - Oxycodone 5 mg # 30 for pain Gatha Mayer, MD 01/14/2020 2:59:43 PM This report has been signed electronically.

## 2020-01-14 NOTE — Patient Instructions (Addendum)
SOFT DIET RECOMMENDED CONTINUE PRESENT MEDICATIONS AWAIT PATHOLOGY RESULTS  There was a mass with ulceration seen in the esophagus.  I suspect this is some type of cancer, unfortunately.  Biopsies were taken - the results will take until Tuesday - possibly Monday.  While this is not good news knowing about a problem is important so that we can get you the help you will need.  Once I know the pathology will know the next steps to take.   Oxycodone prescription sent to pharmacy.   I appreciate the opportunity to care for you. Gatha Mayer, MD, FACG   YOU HAD AN ENDOSCOPIC PROCEDURE TODAY AT [facility]:   Refer to the procedure report that was given to you for any specific questions about what was found during the examination.  If the procedure report does not answer your questions, please call your gastroenterologist to clarify.  If you requested that your care partner not be given the details of your procedure findings, then the procedure report has been included in a sealed envelope for you to review at your convenience later.  YOU SHOULD EXPECT: Some feelings of bloating in the abdomen. Passage of more gas than usual.  Walking can help get rid of the air that was put into your GI tract during the procedure and reduce the bloating. If you had a lower endoscopy (such as a colonoscopy or flexible sigmoidoscopy) you may notice spotting of blood in your stool or on the toilet paper. If you underwent a bowel prep for your procedure, you may not have a normal bowel movement for a few days.  Please Note:  You might notice some irritation and congestion in your nose or some drainage.  This is from the oxygen used during your procedure.  There is no need for concern and it should clear up in a day or so.  SYMPTOMS TO REPORT IMMEDIATELY:    Following upper endoscopy (EGD)  Vomiting of blood or coffee ground material  New chest pain or pain under the shoulder  blades  Painful or persistently difficult swallowing  New shortness of breath  Fever of 100F or higher  Black, tarry-looking stools  For urgent or emergent issues, a gastroenterologist can be reached at any hour by calling [phone].   DIET:  We do recommend a small meal at first, but then you may proceed to your regular diet.  Drink plenty of fluids but you should avoid alcoholic beverages for 24 hours.  ACTIVITY:  You should plan to take it easy for the rest of today and you should NOT DRIVE or use heavy machinery until tomorrow (because of the sedation medicines used during the test).    FOLLOW UP: Our staff will call the number listed on your records 48-72 hours following your procedure to check on you and address any questions or concerns that you may have regarding the information given to you following your procedure. If we do not reach you, we will leave a message.  We will attempt to reach you two times.  During this call, we will ask if you have developed any symptoms of COVID 19. If you develop any symptoms (ie: fever, flu-like symptoms, shortness of breath, cough etc.) before then, please call [phone].  If you test positive for Covid 19 in the 2 weeks post procedure, please call and report this information to [place].    If any biopsies were taken you will be contacted by phone or  by letter within the next 1-3 weeks.  Please call us at [phone] if you have not heard about the biopsies in 3 weeks.    SIGNATURES/CONFIDENTIALITY: You and/or your care partner have signed paperwork which will be entered into your electronic medical record.  These signatures attest to the fact that that the information above on your After Visit Summary has been reviewed and is understood.  Full responsibility of the confidentiality of this discharge information lies with you and/or your care-partner.

## 2020-01-14 NOTE — Progress Notes (Signed)
Called to room to assist during endoscopic procedure.  Patient ID and intended procedure confirmed with present staff. Received instructions for my participation in the procedure from the performing physician.  

## 2020-01-14 NOTE — Progress Notes (Signed)
Temp by YF, vitals by DT

## 2020-01-14 NOTE — Progress Notes (Signed)
Report given to PACU, vss 

## 2020-01-17 ENCOUNTER — Telehealth: Payer: Self-pay | Admitting: *Deleted

## 2020-01-17 ENCOUNTER — Telehealth: Payer: Self-pay | Admitting: Internal Medicine

## 2020-01-17 DIAGNOSIS — C159 Malignant neoplasm of esophagus, unspecified: Secondary | ICD-10-CM

## 2020-01-17 DIAGNOSIS — H209 Unspecified iridocyclitis: Secondary | ICD-10-CM | POA: Diagnosis not present

## 2020-01-17 DIAGNOSIS — H18231 Secondary corneal edema, right eye: Secondary | ICD-10-CM | POA: Diagnosis not present

## 2020-01-17 DIAGNOSIS — H35351 Cystoid macular degeneration, right eye: Secondary | ICD-10-CM | POA: Diagnosis not present

## 2020-01-17 NOTE — Telephone Encounter (Signed)
  Follow up Call-  Call back number 01/14/2020 03/12/2018  Post procedure Call Back phone  # [phone]  Permission to leave phone message Yes Yes  Some recent data might be hidden     Patient questions:  Do you have a fever, pain , or abdominal swelling? No. Pain Score  0 *  Have you tolerated food without any problems? Yes.    Have you been able to return to your normal activities? Yes.    Do you have any questions about your discharge instructions: Diet   No. Medications  No. Follow up visit  No.  Do you have questions or concerns about your Care? No.  Actions: * If pain score is 4 or above: No action needed, pain <4.   1. Have you developed a fever since your procedure? no  2.   Have you had an respiratory symptoms (SOB or cough) since your procedure? no  3.   Have you tested positive for COVID 19 since your procedure no  4.   Have you had any family members/close contacts diagnosed with the COVID 19 since your procedure?  no   If yes to any of these questions please route to Joylene John, RN and Alphonsa Gin, Therapist, sports.

## 2020-01-17 NOTE — Telephone Encounter (Signed)
I had phone call from pathology today - she has squamous cell carcinoma of the esophagus.  I called her and gave her results.  Plan:  1) refer to medical oncology Dr. Benay Spice or [facility] 2) Refer to radiation oncology Dr.Moody  Patient is aware I am looking into other testing she may need for staging and will; get back to her.  Please let me know who she gets appointments with and when as I need to communicate with those physicians in advance of appointment

## 2020-01-17 NOTE — Telephone Encounter (Signed)
Referrals have been made and waiting for appt information to pass on to Dr Carlean Purl.

## 2020-01-18 ENCOUNTER — Other Ambulatory Visit: Payer: Self-pay | Admitting: Radiation Oncology

## 2020-01-18 ENCOUNTER — Telehealth: Payer: Self-pay | Admitting: Internal Medicine

## 2020-01-18 ENCOUNTER — Telehealth: Payer: Self-pay | Admitting: Oncology

## 2020-01-18 DIAGNOSIS — C155 Malignant neoplasm of lower third of esophagus: Secondary | ICD-10-CM

## 2020-01-18 MED ORDER — OXYCODONE HCL 5 MG PO TABS: 5.0000 mg | ORAL_TABLET | ORAL | 0 refills | Status: AC | PRN

## 2020-01-18 NOTE — Progress Notes (Signed)
GI Location of Tumor / Histology: Squamous cell carcinoma of esophagus  Jo Morrison presented with several month history of painful swallowing.  She has pain that radiates into the back and chest.  She thought it was acid reflux and heartburn, September 2020.  PET 01/25/20:  Upper Endoscopy 01/14/2020  CT Chest 12/30/2019: No acute cardiopulmonary disease.  Stable fatty infiltration of the liver.  New sclerosis within the medial head of the right clavicle is likely degenerative given progressive joint space narrowing at the right sternoclavicular joint. If bony metastases are suspected or are clinically important to document, bone scan could be considered.  CT abd/pelvis 12/09/2019: No CT findings of the abdomen or pelvis to explain pain or weight loss.  Biopsies of Esophagus 01/14/2020   Past/Anticipated interventions by GI, is any: Dr. Carlean Purl 01/11/2020 -Patient has odynophagia, cause of this is not clear.  She has lost a little bit of weight.  Reflux esophagitis infectious esophagitis seem to be most likely, dysmotility is possible.  Cancer does not typically present this way but needs to be excluded.  We will schedule for an upper GI endoscopy on February 19.  Past/Anticipated interventions by surgeon, if any:   Past/Anticipated interventions by medical oncology, if any:  Dr. Benay Spice 01/20/2020 2pm  Weight changes, if any: Lost about 5 pounds.  Bowel/Bladder complaints, if any: No  Nausea / Vomiting, if any: No  Pain issues, if any:  Difficult to swallow, takes a little more force to get things down.  It is just as difficult with solids and liquids.  She has started eating softer foods.       SAFETY ISSUES:  Prior radiation? Breast Cancer x2, radiation done both times-6.5 first time, 4 weeks 2nd time-Done in [place] at [facility]-1996, 2011  Pacemaker/ICD? No  Possible current pregnancy? Postmenopausal  Is the patient on methotrexate? No  Current  Complaints/Details:

## 2020-01-18 NOTE — Progress Notes (Signed)
Spoke with patient over the phone introducing myself as GI Nurse Navigator.  She was given my direct phone number.  I also informed her I scheduled her for PET scan (1st available) on 01/26/2020 at 1:00 pm at [facility], to arrive at 12:30, NPO for 6 hours prior, sips of water only.  Confirmed her appointment with Dr. Benay Spice on 01/20/2020 at 2:00 pm, suggested arriving at least 15-20 minutes prior for registration process.  Also she was told to bring her husband to that appointment.  The patient verbalized an understanding of the information given and was able to repeat it back to me.

## 2020-01-18 NOTE — Telephone Encounter (Signed)
Please see note, thank you Sir.

## 2020-01-18 NOTE — Telephone Encounter (Signed)
Received a new pt referral from Dr. Carlean Purl for Squamous cell carcinoma of esophagus. Jo Morrison has been cld and scheduled to see Dr. Benay Spice on 2/25 at 2pm. Pt aware to arrive 15 minutes early.

## 2020-01-18 NOTE — Telephone Encounter (Signed)
Dr Carlean Purl the pt is scheduled to see Dr Benay Spice and Dr Lisbeth Renshaw.

## 2020-01-18 NOTE — Telephone Encounter (Signed)
Pharmacy called to request script to be changed from capsules to tablets on the Oxycodone

## 2020-01-18 NOTE — Telephone Encounter (Signed)
Medication changed to tablet

## 2020-01-19 ENCOUNTER — Telehealth: Payer: Self-pay | Admitting: *Deleted

## 2020-01-19 ENCOUNTER — Encounter: Payer: Self-pay | Admitting: Radiation Oncology

## 2020-01-19 ENCOUNTER — Ambulatory Visit
Admission: RE | Admit: 2020-01-19 | Discharge: 2020-01-19 | Disposition: A | Discharge status: Home / Self Care | Payer: PPO | Source: Ambulatory Visit | Attending: Radiation Oncology | Admitting: Radiation Oncology

## 2020-01-19 ENCOUNTER — Other Ambulatory Visit: Payer: Self-pay

## 2020-01-19 VITALS — Ht 63.0 in | Wt 121.0 lb

## 2020-01-19 DIAGNOSIS — D0512 Intraductal carcinoma in situ of left breast: Secondary | ICD-10-CM

## 2020-01-19 DIAGNOSIS — C158 Malignant neoplasm of overlapping sites of esophagus: Secondary | ICD-10-CM

## 2020-01-19 DIAGNOSIS — Z170 Estrogen receptor positive status [ER+]: Secondary | ICD-10-CM

## 2020-01-19 DIAGNOSIS — C50511 Malignant neoplasm of lower-outer quadrant of right female breast: Secondary | ICD-10-CM

## 2020-01-19 DIAGNOSIS — Z853 Personal history of malignant neoplasm of breast: Secondary | ICD-10-CM | POA: Diagnosis not present

## 2020-01-19 DIAGNOSIS — Z86000 Personal history of in-situ neoplasm of breast: Secondary | ICD-10-CM | POA: Diagnosis not present

## 2020-01-19 DIAGNOSIS — Z923 Personal history of irradiation: Secondary | ICD-10-CM | POA: Diagnosis not present

## 2020-01-19 NOTE — Telephone Encounter (Signed)
Returned patient's phone call, lvm for a return call 

## 2020-01-19 NOTE — Progress Notes (Signed)
Radiation Oncology         ([phone]) [phone] ________________________________  Name: Jo Morrison        MRN: [id number]  Date of Service: 01/19/2020 DOB: 1946-05-18  [id number], Real Cons, MD  Gatha Mayer, MD     REFERRING PHYSICIAN: Gatha Mayer, MD   DIAGNOSIS: The encounter diagnosis was Ductal carcinoma in situ (DCIS) of left breast.   HISTORY OF PRESENT ILLNESS: Jo Morrison is a 74 y.o. female seen at the request of Dr. 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 She has a history of right invasive ER positive ductal breast cancer treated with lumpectomy, adjuvant chemotherapy and adjuvant radiotherapy and antiestrogen therapy in 1996, and a left ER positive DCIS that was treated with lumpectomy, radiotherapy, and antiestogen therapyShe noted symptoms of heartburn back in the early fall of 2020, she had started a PPI but this did not help her symptoms. She had some symptoms of odynophagia, a few pounds of unintentional weigh tlossand a pain radiating into her back. A CT abdomen and pelvis with contrast on 12/09/19 did not show evidence of abnormal findings to explain her symptoms. A CT chest without contrast did not show acute cardiovascular changes. She did have a new sclerosis at the medial head of the right clavicle felt to be degenerative in nature. She continued to have symptoms and proceeded with EGD on 01/14/20 which revealed a large ulcerating mass at the proximal esophagus and extending to the mid esophagus. The lesion was partially obstructing and partially circumferential involving 2/3 of the lumen circumference. The remainder of the examination was negative for anomaly. A biopsy showed an invasive moderately differentiated squamous cell carcinoma. She is seen today on Mychart to discuss next steps for treatment of her cancer.   PREVIOUS RADIATION THERAPY: Yes   1996: The right breast was treated over 6 1/2 weeks at [facility] in [facility], details are otherwise unknown.   01/22/10-02/26/10: The left breast was treated over 4 weeks at [facility] in [facility], details are otherwise unknown.     PAST MEDICAL HISTORY:  Past Medical History:  Diagnosis Date  . Allergy   . Anxiety   . Asthma   . Breast cancer ([place]) 2012   left  . Breast cancer, right ([place]) 1996   right  . Cataract    cataracts removed  . Chronic uveitis of right eye   . Corneal edema of right eye   . Cystoid macular edema of right eye   . Depressive disorder   . DVT (deep venous thrombosis) ([place])   . Glaucoma    not on eye drops at this time.  . Hypertension   . Hypertensive retinopathy of both eyes   . Olecranon bursitis   . Osteoarthritis   . Other disorders of bone and cartilage(733.99)   . Pseudoexfoliation (PXF) glaucoma of both eyes   . Pseudophakia, both eyes   . Seasonal allergies   . Unspecified pruritic disorder        PAST SURGICAL HISTORY: Past Surgical History:  Procedure Laterality Date  . APPENDECTOMY  1958  . BREAST LUMPECTOMY Right 1996   Dr. Corena Herter in [place]  . BREAST LUMPECTOMY Left 2011   Dr. Pleas Patricia  . cataract Bilateral   . CATARACT EXTRACTION Bilateral    Dr. Lucita Ferrara  . COLONOSCOPY  2011-last  . DUPUYTREN CONTRACTURE RELEASE    . HAND SURGERY Right   .  LASIK Bilateral 2007  . POLYPECTOMY    . ROTATOR CUFF REPAIR  2007  . TONSILLECTOMY  1967     FAMILY HISTORY:  Family History  Problem Relation Age of Onset  . Stroke Father 97  . Heart disease Brother   . Colon cancer Maternal Aunt 54  . Macular degeneration Mother   . Colon polyps Neg Hx   . Esophageal cancer Neg Hx   . Rectal cancer Neg Hx   . Stomach cancer Neg Hx      SOCIAL HISTORY:  reports that she has never smoked. She has never used smokeless tobacco. She reports current alcohol use of about 25.0 standard drinks of alcohol per week. She reports that she does not use drugs. The patient  is married and lives in [place]. She previously lived in [place], [place]. She is retired but worked as a Garment/textile technologist.    ALLERGIES: Latex   MEDICATIONS:  Current Outpatient Medications  Medication Sig Dispense Refill  . Albuterol Sulfate (PROAIR RESPICLICK) [id number] (90 BASE) MCG/ACT AEPB Inhale 2 puffs into the lungs every 4 (four) hours as needed (SOB). 1 each 0  . Bromfenac Sodium (PROLENSA) 0.07 % SOLN Place 1 drop into the right eye 4 (four) times daily. 6 mL 3  . budesonide (PULMICORT) 180 MCG/ACT inhaler Inhale 2 puffs into the lungs 2 (two) times daily.    . clonazePAM (KLONOPIN) 0.5 MG tablet Take 1 tablet (0.5 mg total) by mouth at bedtime as needed. 30 tablet 0  . dorzolamide-timolol (COSOPT) 22.3-6.8 MG/ML ophthalmic solution Place 1 drop into the right eye 3 (three) times daily.    . fluorometholone (FML) 0.1 % ophthalmic suspension fluorometholone 0.1 % eye drops,suspension    . fluticasone (FLONASE) 50 MCG/ACT nasal spray Place into the nose.    Marland Kitchen FLUZONE HIGH-DOSE QUADRIVALENT 0.7 ML SUSY     . gabapentin (NEURONTIN) 100 MG capsule Take 100 mg by mouth at bedtime as needed. Patient takes 100 mg at night  3  . hydrochlorothiazide (HYDRODIURIL) 25 MG tablet TAKE 1 TABLET(25 MG) BY MOUTH DAILY 90 tablet 3  . methocarbamol (ROBAXIN) 500 MG tablet Take 1 tablet (500 mg total) by mouth every 8 (eight) hours as needed for muscle spasms. 60 tablet 0  . montelukast (SINGULAIR) 10 MG tablet TAKE 1 TABLET(10 MG) BY MOUTH AT BEDTIME 90 tablet 0  . oxyCODONE (OXY IR/ROXICODONE) 5 MG immediate release tablet Take 1 tablet (5 mg total) by mouth every 4 (four) hours as needed for up to 7 days for severe pain. 30 tablet 0  . REFRESH OPTIVE ADVANCED 0.5-1-0.5 % SOLN SMARTSIG:1 Drop(s) In Eye(s) As Needed    . traZODone (DESYREL) 100 MG tablet TAKE 1 TO 2 TABLETS(100 TO 200 MG) BY MOUTH AT BEDTIME AS NEEDED FOR SLEEP 180 tablet 0  . traZODone (DESYREL) 150 MG tablet Take 150 mg by mouth at  bedtime.    . triamcinolone cream (KENALOG) 0.1 % Apply 1 application topically 2 (two) times daily. 100 g 0  . valACYclovir (VALTREX) 1000 MG tablet Take 1,000 mg by mouth 3 (three) times daily.    Marland Kitchen venlafaxine XR (EFFEXOR-XR) 150 MG 24 hr capsule TAKE 1 CAPSULE BY MOUTH EVERY MORNING 90 capsule 3   No current facility-administered medications for this encounter.     REVIEW OF SYSTEMS: On review of systems, the patient reports that she is doing okay. She has had progressive dysphagia, and is able to take her medications, but is eating soft foods  and liquids now due to the dysphagia. She has had some upper back discomfort as well in the past few months. She denies any chest pain, shortness of breath, cough, fevers, chills, night sweats, unintended weight changes. She denies any bowel or bladder disturbances, and denies abdominal pain, nausea or vomiting. She denies any other musculoskeletal or joint aches or pains. A complete review of systems is obtained and is otherwise negative.     PHYSICAL EXAM:  Unable to assess vitals due to encounter type.  In general this is a well appearing caucasian female in no acute distress. She's alert and oriented x4 and appropriate throughout the examination. Cardiopulmonary assessment is negative for acute distress and she exhibits normal effort.     ECOG = 1  0 - Asymptomatic (Fully active, able to carry on all predisease activities without restriction)  1 - Symptomatic but completely ambulatory (Restricted in physically strenuous activity but ambulatory and able to carry out work of a light or sedentary nature. For example, light housework, office work)  2 - Symptomatic, <50% in bed during the day (Ambulatory and capable of all self care but unable to carry out any work activities. Up and about more than 50% of waking hours)  3 - Symptomatic, >50% in bed, but not bedbound (Capable of only limited self-care, confined to bed or chair 50% or more of  waking hours)  4 - Bedbound (Completely disabled. Cannot carry on any self-care. Totally confined to bed or chair)  5 - Death   Eustace Pen MM, Creech RH, Tormey DC, et al. [phone]). "Toxicity and response criteria of the [facility] Group". [place] Oncol. 5 (6): 649-55    LABORATORY DATA:  Lab Results  Component Value Date   WBC 11.3 (H) 11/29/2019   HGB 14.7 11/29/2019   HCT 43.7 11/29/2019   MCV 93.6 11/29/2019   PLT 289.0 11/29/2019   Lab Results  Component Value Date   NA 138 11/29/2019   K 3.3 (L) 11/29/2019   CL 95 (L) 11/29/2019   CO2 30 11/29/2019   Lab Results  Component Value Date   ALT 26 11/29/2019   AST 22 11/29/2019   ALKPHOS 78 11/29/2019   BILITOT 0.9 11/29/2019      RADIOGRAPHY: CT CHEST WO CONTRAST  Result Date: 12/30/2019 CLINICAL DATA:  Chest pain and shortness of breath for 4 months, back pain, history of bilateral breast cancer EXAM: CT CHEST WITHOUT CONTRAST TECHNIQUE: Multidetector CT imaging of the chest was performed following the standard protocol without IV contrast. COMPARISON:  11/28/2014 FINDINGS: Cardiovascular: Unenhanced imaging of the heart and great vessels demonstrates mild atherosclerosis of the aortic arch. No pericardial effusion. Mediastinum/Nodes: No enlarged mediastinal or axillary lymph nodes. Thyroid gland, trachea, and esophagus demonstrate no significant findings. Lungs/Pleura: No acute airspace disease, effusion, or pneumothorax. No pulmonary nodules or masses. Central airways are widely patent. Upper Abdomen: Stable fatty infiltration of the liver. Otherwise no acute process. Musculoskeletal: Marked rotatory scoliosis of the thoracolumbar spine is stable. There is new sclerosis within the medial head of the right clavicle, which is likely degenerative given progressive joint space narrowing at the right sternoclavicular joint. If bony metastases are suspected or are clinically important to document, bone scan could be  considered. Reconstructed images demonstrate no additional findings. IMPRESSION: 1. No acute cardiopulmonary disease. 2. Stable fatty infiltration of the liver. 3. New sclerosis within the medial head of the right clavicle is likely degenerative given progressive joint space narrowing at the  right sternoclavicular joint. If bony metastases are suspected or are clinically important to document, bone scan could be considered. 4. Aortic Atherosclerosis (ICD10-I70.0). Electronically Signed   By: Randa Ngo M.D.   On: 12/30/2019 11:36   MM 3D SCREEN BREAST BILATERAL  Result Date: 01/12/2020 CLINICAL DATA:  Screening. EXAM: DIGITAL SCREENING BILATERAL MAMMOGRAM WITH TOMO AND CAD COMPARISON:  Previous exam(s). ACR Breast Density Category b: There are scattered areas of fibroglandular density. FINDINGS: There are no findings suspicious for malignancy. Images were processed with CAD. IMPRESSION: No mammographic evidence of malignancy. A result letter of this screening mammogram will be mailed directly to the patient. RECOMMENDATION: Screening mammogram in one year. (Code:SM-B-01Y) BI-RADS CATEGORY  1: Negative. Electronically Signed   By: Dorise Bullion III M.D   On: 01/12/2020 16:37       IMPRESSION/PLAN: 1. Squamous Cell Carcinoma of the proximal and mid esophagus. Dr. Lisbeth Renshaw discusses the pathology findings and reviews the nature of esophageal cancer. Dr. Lisbeth Renshaw discusses the rationale for proceeding with PET imaging for staging. If she does not have any regional adenopathy, she would benefit from endoscopic ultrasound (EUS) to determine if she would be a surgical candidate. If she has confirmed disease in the lymphatics she would benefit from chemoRT, and possible surgery at a later time. We will follow up with the next steps after her PET scan next week and coordinate with GI if needed. We did outline the course of chemoRT. We discussed the risks, benefits, short, and long term effects of radiotherapy, and  the patient is interested in proceeding depending on if indicated. Dr. Lisbeth Renshaw discusses the delivery and logistics of radiotherapy and anticipates a course of 5 1/2  weeks of radiotherapy. We will reconvene next week to make plans for her next steps toward treatment, but I've reserved time for simulation next week if we proceed with chemoRT. 2. Remote history of bilateral breast cancers. She will continue to follow up with Dr. Lindi Adie in medical oncology as well.   This encounter was provided by telemedicine platform MyChart.  The patient has provided two factor identification and has given verbal consent for this type of encounter and has been advised to only accept a meeting of this type in a secure network environment. The time spent during this encounter was 60 minutes including preparation, discussion, and coordination of the patient's care. The attendants for this meeting include Blenda Nicely, RN, Dr. Lisbeth Renshaw, Hayden Pedro  and Rae Roam.  During the encounter,  Blenda Nicely, RN, Dr. Lisbeth Renshaw, and Hayden Pedro were located at [facility] Radiation Oncology Department.  Jo Morrison was located at home.    The above documentation reflects my direct findings during this shared patient visit. Please see the separate note by Dr. Lisbeth Renshaw on this date for the remainder of the patient's plan of care.    Carola Rhine, PAC

## 2020-01-19 NOTE — Telephone Encounter (Signed)
Called patient to inform of Pet Scan for 01-24-20 - arrival time- 2:30 pm @ [facility], patient to be NPO- 6 hrs. prior to test, lvm for a return call

## 2020-01-20 ENCOUNTER — Inpatient Hospital Stay: Payer: PPO | Attending: Oncology | Admitting: Oncology

## 2020-01-20 ENCOUNTER — Telehealth: Payer: Self-pay | Admitting: Oncology

## 2020-01-20 ENCOUNTER — Telehealth: Payer: Self-pay | Admitting: *Deleted

## 2020-01-20 ENCOUNTER — Encounter: Payer: Self-pay | Admitting: *Deleted

## 2020-01-20 ENCOUNTER — Other Ambulatory Visit: Payer: Self-pay

## 2020-01-20 VITALS — BP 145/89 | HR 109 | Temp 98.5°F | Resp 18 | Ht 63.0 in | Wt 119.6 lb

## 2020-01-20 DIAGNOSIS — I10 Essential (primary) hypertension: Secondary | ICD-10-CM | POA: Diagnosis not present

## 2020-01-20 DIAGNOSIS — C50511 Malignant neoplasm of lower-outer quadrant of right female breast: Secondary | ICD-10-CM

## 2020-01-20 DIAGNOSIS — Z9221 Personal history of antineoplastic chemotherapy: Secondary | ICD-10-CM

## 2020-01-20 DIAGNOSIS — Z923 Personal history of irradiation: Secondary | ICD-10-CM

## 2020-01-20 DIAGNOSIS — Z170 Estrogen receptor positive status [ER+]: Secondary | ICD-10-CM

## 2020-01-20 DIAGNOSIS — Z853 Personal history of malignant neoplasm of breast: Secondary | ICD-10-CM | POA: Diagnosis not present

## 2020-01-20 DIAGNOSIS — C159 Malignant neoplasm of esophagus, unspecified: Secondary | ICD-10-CM

## 2020-01-20 DIAGNOSIS — J449 Chronic obstructive pulmonary disease, unspecified: Secondary | ICD-10-CM | POA: Diagnosis not present

## 2020-01-20 DIAGNOSIS — F329 Major depressive disorder, single episode, unspecified: Secondary | ICD-10-CM | POA: Diagnosis not present

## 2020-01-20 DIAGNOSIS — Z86000 Personal history of in-situ neoplasm of breast: Secondary | ICD-10-CM

## 2020-01-20 DIAGNOSIS — C154 Malignant neoplasm of middle third of esophagus: Secondary | ICD-10-CM | POA: Diagnosis not present

## 2020-01-20 MED ORDER — ONDANSETRON HCL 8 MG PO TABS: 8.0000 mg | ORAL_TABLET | Freq: Three times a day (TID) | ORAL | 1 refills | Status: DC | PRN

## 2020-01-20 MED ORDER — PROCHLORPERAZINE MALEATE 10 MG PO TABS: 10.0000 mg | ORAL_TABLET | Freq: Four times a day (QID) | ORAL | 1 refills | Status: DC | PRN

## 2020-01-20 NOTE — Telephone Encounter (Signed)
Scheduled appt per 2/25 los - pt aware of appts added - no need for print out - pt mychart active

## 2020-01-20 NOTE — Research (Signed)
[employer] 2018-01 Blood Sample collection to Evaluate Biomarkers in Subjects with Untreated Solid Tumors; Dr. Sherrill referred patient for the above study.  Met with patient and her husband for 5 minutes in exam room to provide her with the information for this study.  Informed patient that participation is voluntary. Informed patient briefly of the purpose and possible risks and benefits of the study. Explained the study collects 5 tubes of blood prior to any cancer treatment. The study also asks demographic and health information including smoking and alcohol history. Informed patient that participants receive a $50 [employer] gift card after they donate blood and information. Gave patient a copy of the hippa embedded consent form along with a clinical trials informational brochure and my business card. Patient states that she will call research nurse next week to let us know if she is interested in participating.  Informed patient if she is interested, then we would need to collect the research blood prior to starting chemotherapy and radiation which is planned to start 01/31/20.  Thanked patient for her time and willingness to consider this study. Encouraged patient to call or email research nurse if any questions. She verbalized understanding.  Cameo Windham, BSN, RN Clinical Research Nurse 01/20/2020 3:40 PM   

## 2020-01-20 NOTE — Telephone Encounter (Signed)
PATIENT TO HAVE THIS APPT. ON 01-25-20 @ 3 PM WITH DR. [id number] - ADDRESS - [place].

## 2020-01-20 NOTE — Progress Notes (Signed)
[facility] Patient Consult   Requesting MD: Armanii Urbanik 74 y.o.  06/10/1946    Reason for Consult: Esophagus cancer   HPI:  Ms. Hemrick reports the onset of dysphagia and upper anterior chest/back discomfort beginning in September 2020.  She tried over-the-counter Pepcid and this did not help.  She reports she was prescribed a prescription antacid by her primary provider and this also did not help.  She was evaluated for fecal incontinence by and this improved with pelvic physical therapy. She was referred to Dr. Carlean Purl and was taken to an upper endoscopy on 01/14/2020.  An ulcerated mass was found in the proximal esophagus and midesophagus at 26-31 cm from the incisors.  The mass was partially obstructing.  Biopsies were obtained.  The pathology revealed invasive moderately differentiated squamous cell carcinoma.  She underwent a CT evaluation including a CT of the abdomen and pelvis on 12/08/2018 that was unremarkable.  A CT of the chest on 12/30/2019 found the esophagus to appear unremarkable.  Sclerosis was noted in the medial head of the right clavicle felt to be degenerative.  She saw Dr. Lisbeth Renshaw yesterday and has been referred for a staging PET scan.   Past Medical History:  Diagnosis Date  . Allergy   . Anxiety   . Asthma   . Breast cancer ([place]) 2012   left, DCIS with comedonecrosis, 1.1 cm, [name], [name], lumpectomy/adjuvant radiation and 5 years of tamoxifen  . Breast cancer, right ([place]) 1996   right, [name], [name], right lumpectomy and axillary dissection, 4 cycles of AC, adjuvant radiation, and 5 years of tamoxifen  . Cataract    cataracts removed  . Chronic uveitis of right eye   . Corneal edema of right eye   . Cystoid macular edema of right eye   . Depressive disorder   . DVT (deep venous thrombosis) ([facility] related  1990s  . Glaucoma    not on eye drops at this time.  . Hypertension   . Hypertensive  retinopathy of both eyes   . Olecranon bursitis   . Osteoarthritis   . Other disorders of bone and cartilage(733.99)   . Pseudoexfoliation (PXF) glaucoma of both eyes   . Pseudophakia, both eyes   . Seasonal allergies   . Unspecified pruritic disorder     .  G0, P0  Past Surgical History:  Procedure Laterality Date  . APPENDECTOMY  1958  . BREAST LUMPECTOMY Right 1996   Dr. Corena Herter in [place]  . BREAST LUMPECTOMY Left 2011   Dr. Pleas Patricia  . cataract Bilateral   . CATARACT EXTRACTION Bilateral    Dr. Lucita Ferrara  . COLONOSCOPY  2011-last  . DUPUYTREN CONTRACTURE RELEASE    . HAND SURGERY Right 2020  . LASIK Bilateral 2007  . POLYPECTOMY    . ROTATOR CUFF REPAIR  2007  . TONSILLECTOMY  1967    Medications: Reviewed  Allergies:  Allergies  Allergen Reactions  . Latex Shortness Of Breath    Family history: A maternal aunt had gastric cancer.  Social History:   Lives with her husband in [place].  She is retired from a Air cabin crew job.  She does not use cigarettes.  She drinks 1 to 2 glasses of wine per day.  No transfusion history.  No risk factor for HIV or hepatitis.  ROS:   Positives include: 6-7 pound weight loss, dysphagia, odynophagia, discomfort at the upper anterior chest and back, right  eye visual loss  A complete ROS was otherwise negative.  Physical Exam:  Blood pressure (!) 145/89, pulse (!) 109, temperature 98.5 F (36.9 C), temperature source Temporal, resp. rate 18, height _0  (1.6 m), weight 119 lb 9.6 oz (54.3 kg), SpO2 99 %.  HEENT: Neck without mass Lungs: Clear bilaterally Cardiac: Regular rate and rhythm Abdomen: No hepatosplenomegaly, no mass, nontender Breast: Status post bilateral lumpectomy.  No evidence for local tumor recurrence.  No mass in either breast. Vascular: No leg edema Lymph nodes: No cervical, supraclavicular, axillary, or inguinal nodes Neurologic: Alert and oriented, the motor exam appears intact in the  upper and lower extremities bilaterally Skin: No rash Musculoskeletal: No spine tenderness   LAB:  CBC  Lab Results  Component Value Date   WBC 11.3 (H) 11/29/2019   HGB 14.7 11/29/2019   HCT 43.7 11/29/2019   MCV 93.6 11/29/2019   PLT 289.0 11/29/2019   NEUTROABS 8.5 (H) 11/29/2019        CMP  Lab Results  Component Value Date   NA 138 11/29/2019   K 3.3 (L) 11/29/2019   CL 95 (L) 11/29/2019   CO2 30 11/29/2019   GLUCOSE 98 11/29/2019   BUN 13 11/29/2019   CREATININE 0.74 11/29/2019   CALCIUM 9.6 11/29/2019   PROT 7.5 11/29/2019   ALBUMIN 4.3 11/29/2019   AST 22 11/29/2019   ALT 26 11/29/2019   ALKPHOS 78 11/29/2019   BILITOT 0.9 11/29/2019   GFRNONAA 89 06/09/2014   GFRAA 103 06/09/2014      Imaging: As per HPI   Assessment/Plan:   1. Squamous cell carcinoma of the upper/middle third of the esophagus  Upper endoscopy 01/14/2020-ulcerated mass in the proximal/mid esophagus at 26-31 cm from the incisors, biopsy confirmed invasive moderately differentiated squamous cell carcinoma  CT abdomen/pelvis 12/09/2019-uterine fibroids  CT chest 12/30/2019-normal esophagus, new sclerosis of the medial head of the right clavicle likely degenerative 2. Right breast cancer July 1996, [name], [name], right lumpectomy and axillary dissection followed by adjuvant AC chemotherapy-4 cycles, 5 years of tamoxifen, 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, macular edema, and corneal edema  7.  Asthma   Disposition:   Ms. Lowden has been diagnosed with squamous cell carcinoma of the upper to mid esophagus.  There is no clinical or CT evidence of metastatic disease.  She appears to be a candidate for combined modality therapy.  I recommend weekly Taxol/carboplatin and concurrent  radiation.  She is scheduled to see Dr. Servando Snare to consider an esophagectomy.  She does not have typical risk factors for squamous cell carcinoma.  This may be a secondary malignancy related to breast radiation.  She is scheduled for a staging PET scan on 01/24/2020.  She will attend a chemotherapy teaching class that day.  The plan is to initiate concurrent chemotherapy and radiation on 01/31/2020.  I reviewed potential toxicities of the Taxol/carboplatin regimen with Ms. Flanary and her husband.  She understands the potential for nausea/vomiting, mucositis, alopecia, and hematologic toxicity.  We discussed the allergic reaction, bone pain, and neuropathy associated with Taxol.  We discussed the allergic reaction seen with carboplatin.  She agrees to proceed.  She will obtain records regarding her treatment with chemotherapy in the 1990s.  I will present  her case at the GI tumor conference.  We will discuss the indication for a staging EUS prior to beginning treatment.  This will be based on the PET scan result as it is possible she will be a candidate for surgery alone if she has an early stage tumor.  Betsy Coder, MD  01/20/2020, 5:42 PM

## 2020-01-20 NOTE — Progress Notes (Signed)
START ON PATHWAY REGIMEN - Gastroesophageal ? ? ?  Administer weekly during RT: ?    Paclitaxel  ?    Carboplatin  ? ?**Always confirm dose/schedule in your pharmacy ordering system** ? ?Patient Characteristics: ?Esophageal & GE Junction, Squamous Cell, Preoperative or Nonsurgical Candidate (Clinical Staging), cT2 or Higher or cN+, Surgical Candidate (Up to cT4a) - Preoperative Therapy ?Histology: Squamous Cell ?Disease Classification: Esophageal ?Therapeutic Status: Preoperative or Nonsurgical Candidate (Clinical Staging) ?AJCC M Category: Staged < 8th Ed. ?AJCC 8 Stage Grouping: Staged < 8th Ed. ?AJCC Grade: Staged < 8th Ed. ?AJCC Location: Staged < 8th Ed. ?AJCC T Category: Staged < 8th Ed. ?AJCC N Category: Staged < 8th Ed. ?Intent of Therapy: ?Curative Intent, Discussed with Patient ?

## 2020-01-21 ENCOUNTER — Other Ambulatory Visit: Payer: Self-pay | Admitting: *Deleted

## 2020-01-21 ENCOUNTER — Encounter: Payer: Self-pay | Admitting: General Practice

## 2020-01-21 DIAGNOSIS — Z006 Encounter for examination for normal comparison and control in clinical research program: Secondary | ICD-10-CM

## 2020-01-21 MED ORDER — DEXAMETHASONE 2 MG PO TABS: ORAL_TABLET | ORAL | 0 refills | Status: DC

## 2020-01-21 NOTE — Progress Notes (Signed)
[facility] Psychosocial Distress Screening Clinical Social Work  Clinical Social Work was referred by distress screening protocol.  The patient scored a 6 on the Psychosocial Distress Thermometer which indicates moderate distress. Clinical Social Worker contacted patient by phone to assess for distress and other psychosocial needs.   Got diagnosis of esophageal cancer after significant time of investigation by various medical professionals.  Now that she has a clear diagnosis and expected treatment plan (concurrent radiation and chemotherapy), she is anxious to get treatment started.  Has pending PET scan, aware results may alter treatment plan.  Has told some family members, waiting to let friends know of her cancer diagnosis until she is ready to do so.  Currently rates distress at "5", feels this will decrease once she starts treatment and has told friends of her new situation.  Good support from husband, will have more support from friends once she lets them know. Concern about food related to pain on eating, has been given strategies to address nutrition (slowed eating, smaller bites, Mylanta, pain medications).  Scheduled to meet w dietitian during infusion mid March. She will call w any questions.concerns as they arise.   Advised about [employer] and resources available, emailed calendar.    ONCBCN DISTRESS SCREENING 01/19/2020  Screening Type Initial Screening  Distress experienced in past week (1-10) 6  Practical problem type   Emotional problem type   Information Concerns Type Lack of info about treatment;Lack of info about diagnosis  Physical Problem type   Physician notified of physical symptoms   Referral to clinical psychology   Referral to clinical social work   Referral to dietition   Referral to financial advocate   Referral to support programs   Referral to palliative care   Other Contact via phone    Clinical Social Worker follow up needed: No.  If yes, follow up plan:  Jo Morrison, [place], [place] Social Worker Phone:  [phone] Cell:  [phone]

## 2020-01-21 NOTE — Progress Notes (Signed)
Error

## 2020-01-24 ENCOUNTER — Inpatient Hospital Stay: Payer: PPO | Admitting: *Deleted

## 2020-01-24 ENCOUNTER — Other Ambulatory Visit: Payer: Self-pay

## 2020-01-24 ENCOUNTER — Inpatient Hospital Stay: Payer: PPO

## 2020-01-24 ENCOUNTER — Inpatient Hospital Stay: Payer: PPO | Attending: Oncology

## 2020-01-24 ENCOUNTER — Encounter: Payer: Self-pay | Admitting: *Deleted

## 2020-01-24 ENCOUNTER — Encounter (HOSPITAL_COMMUNITY)
Admission: RE | Admit: 2020-01-24 | Discharge: 2020-01-24 | Disposition: A | Discharge status: Home / Self Care | Payer: PPO | Source: Ambulatory Visit | Attending: Radiation Oncology | Admitting: Radiation Oncology

## 2020-01-24 DIAGNOSIS — C159 Malignant neoplasm of esophagus, unspecified: Secondary | ICD-10-CM

## 2020-01-24 DIAGNOSIS — C155 Malignant neoplasm of lower third of esophagus: Secondary | ICD-10-CM | POA: Diagnosis not present

## 2020-01-24 DIAGNOSIS — K760 Fatty (change of) liver, not elsewhere classified: Secondary | ICD-10-CM | POA: Diagnosis not present

## 2020-01-24 DIAGNOSIS — D259 Leiomyoma of uterus, unspecified: Secondary | ICD-10-CM | POA: Insufficient documentation

## 2020-01-24 DIAGNOSIS — Z006 Encounter for examination for normal comparison and control in clinical research program: Secondary | ICD-10-CM

## 2020-01-24 DIAGNOSIS — Z5111 Encounter for antineoplastic chemotherapy: Secondary | ICD-10-CM | POA: Insufficient documentation

## 2020-01-24 DIAGNOSIS — Z853 Personal history of malignant neoplasm of breast: Secondary | ICD-10-CM | POA: Insufficient documentation

## 2020-01-24 DIAGNOSIS — Z170 Estrogen receptor positive status [ER+]: Secondary | ICD-10-CM | POA: Diagnosis not present

## 2020-01-24 DIAGNOSIS — F329 Major depressive disorder, single episode, unspecified: Secondary | ICD-10-CM | POA: Diagnosis not present

## 2020-01-24 DIAGNOSIS — C771 Secondary and unspecified malignant neoplasm of intrathoracic lymph nodes: Secondary | ICD-10-CM | POA: Insufficient documentation

## 2020-01-24 DIAGNOSIS — C772 Secondary and unspecified malignant neoplasm of intra-abdominal lymph nodes: Secondary | ICD-10-CM | POA: Insufficient documentation

## 2020-01-24 DIAGNOSIS — Z923 Personal history of irradiation: Secondary | ICD-10-CM | POA: Diagnosis not present

## 2020-01-24 DIAGNOSIS — J45909 Unspecified asthma, uncomplicated: Secondary | ICD-10-CM | POA: Diagnosis not present

## 2020-01-24 DIAGNOSIS — R1310 Dysphagia, unspecified: Secondary | ICD-10-CM | POA: Diagnosis not present

## 2020-01-24 DIAGNOSIS — I10 Essential (primary) hypertension: Secondary | ICD-10-CM | POA: Insufficient documentation

## 2020-01-24 DIAGNOSIS — C154 Malignant neoplasm of middle third of esophagus: Secondary | ICD-10-CM | POA: Diagnosis not present

## 2020-01-24 DIAGNOSIS — C50511 Malignant neoplasm of lower-outer quadrant of right female breast: Secondary | ICD-10-CM

## 2020-01-24 LAB — CMP (CANCER CENTER ONLY)
ALT: 17 U/L (ref 0–44)
AST: 23 U/L (ref 15–41)
Albumin: 3.6 g/dL (ref 3.5–5.0)
Alkaline Phosphatase: 73 U/L (ref 38–126)
Anion gap: 9 (ref 5–15)
BUN: 15 mg/dL (ref 8–23)
CO2: 32 mmol/L (ref 22–32)
Calcium: 8.9 mg/dL (ref 8.9–10.3)
Chloride: 100 mmol/L (ref 98–111)
Creatinine: 0.72 mg/dL (ref 0.44–1.00)
GFR, Est AFR Am: >60 mL/min (ref >60)
GFR, Estimated: >60 mL/min (ref >60)
Glucose, Bld: 102 mg/dL — ABNORMAL HIGH (ref 70–99)
Potassium: 3.5 mmol/L (ref 3.5–5.1)
Sodium: 141 mmol/L (ref 135–145)
Total Bilirubin: 0.7 mg/dL (ref 0.3–1.2)
Total Protein: 7 g/dL (ref 6.5–8.1)

## 2020-01-24 LAB — RESEARCH LABS

## 2020-01-24 LAB — GLUCOSE, CAPILLARY: Glucose-Capillary: 108 mg/dL — ABNORMAL HIGH (ref 70–99)

## 2020-01-24 MED ORDER — FLUDEOXYGLUCOSE F - 18 (FDG) INJECTION
6.6400 | Freq: Once | INTRAVENOUS | Status: AC | PRN
  Administered 2020-01-24: 6.64 via INTRAVENOUS

## 2020-01-24 NOTE — Progress Notes (Signed)
Patient brought her and husband's Advanced Directive to office. Copies were made with original returned to front desk for pick up. Sent both to be scanned.

## 2020-01-24 NOTE — Research (Signed)
EXACT SCIENCES 2018-01 STUDY; BLOOD SAMPLE COLLECTION TO EVALUATE BIOMARKERS INSUBJECTS WITH UNTREATED SOLID TUMORS.  Met with patient alone in exam room along with another research nurse, Jo Morrison, for 20 minutes. Patient stated she read the consent form at home and has agreed to participate. She came in early before her scheduled lab appointment to sign consent. I providedher with the consent/hippa form for protocolversion 3.0, IRB approved10/29/19. Reviewed Hippa embedded consent formin it's entirety with patient. Explained study procedures, including blood drawand data collection. Discussedalternatives, potential risks and benefits. Patient denied anyquestionsand wants to enroll on this study. She signed and dated the consentand hippa sections of theform. Copy of signed consent form given to patient  for her records. Proceeded to ask patient study questions about her aboutfamily cancer history andsmoking/tobacco history. Patient states she currently drinks alcohol and started at age 21, but had one period of 15 years where she did not drink.  This equates to 37 years total. History worksheet completed per patient interview.  Research Blood collected per study protocol by peripheral blood draw.  Blood sample kit # [id number] was used for this blood collection. Patient tolerated well without any adverse effects.  $50 [employer] gift card given to patient after blood collection.  Eligibility double checked by Jo Morrison, research RN and she confirms patient meets all inclusion and none of the exclusion criteria to be enrolled in the [id number] study.  Dr. Sherrill agrees patient meets eligibility criteria.  Patient enrolled and assigned study ID# [id number] Thankedpatient very much for her time and participation in this study. Jo Morrison, BSN, RN Clinical Research Nurse 01/24/2020 2:30 PM  

## 2020-01-25 ENCOUNTER — Telehealth: Payer: Self-pay | Admitting: Radiation Oncology

## 2020-01-25 ENCOUNTER — Institutional Professional Consult (permissible substitution): Payer: PPO | Admitting: Cardiothoracic Surgery

## 2020-01-25 VITALS — BP 126/76 | HR 106 | Temp 97.5°F | Resp 20 | Ht 63.0 in | Wt 119.0 lb

## 2020-01-25 DIAGNOSIS — C154 Malignant neoplasm of middle third of esophagus: Secondary | ICD-10-CM | POA: Diagnosis not present

## 2020-01-25 NOTE — Progress Notes (Signed)
[address]       ,[place] [address]             [phone]                    Jo Morrison [place] Medical Record #[id number] Date of Birth: 06-07-1946  Referring: Hoyt Koch, * Primary Care: Hoyt Koch, MD Primary Cardiologist: No primary care provider on file.  Chief Complaint:    Chief Complaint  Patient presents with  . Esophageal Cancer    Surgical eval, PET Scan 01/24/20, Chest CT 12/30/19, Upper Endo 01/14/20, PFT's 11/27/17    History of Present Illness:    Jo Morrison 74 y.o. female is seen in the office  today for evaluation of squamous cell carcinoma of the esophagus.  The patient notes that in September 202020 she began having some new onset of heartburn and some pain with eating.  Over-the-counter Pepcid and prescription gastric acid suppression did not help symptoms.  She noted increasing pain with eating sometimes anteriorly and sometimes radiating to the back.  Because of persistent symptoms she was referred to GI.  Dr. Carlean Purl did  upper endoscopy on 01/14/2020.  An ulcerated mass was found in the proximal esophagus and midesophagus at 26-31 cm from the incisors.  The mass was partially obstructing.  Biopsies were obtained.  The pathology revealed invasive moderately differentiated squamous cell carcinoma.  She is a "never" smoker.  Does drink 1 or 2 glasses of wine a day, never user of hard liquors.  Previous history is significant for breast cancer in the right breast in 1996 treated with lumpectomy radiation and chemotherapy and subsequent treatment for breast cancer left lumpectomy and radiation 2011.  Both of these malignancies were cared for in [place].  Current Activity/ Functional Status:  Patient is independent with mobility/ambulation, transfers, ADL's, IADL's.   Zubrod Score: At the time of surgery this patient's most appropriate activity status/level should be described as: _0     0    Normal activity, no  symptoms _1     1    Restricted in physical strenuous activity but ambulatory, able to do out light work _2     2    Ambulatory and capable of self care, unable to do work activities, up and about               >50 % of waking hours                              _3     3    Only limited self care, in bed greater than 50% of waking hours _4     4    Completely disabled, no self care, confined to bed or chair _5     5    Moribund   Past Medical History:  Diagnosis Date  . Allergy   . Anxiety   . Asthma   . Breast cancer ([place]) 2012   left  . Breast cancer, right ([place]) 1996   right  . Cataract    cataracts removed  . Chronic uveitis of right eye   . Corneal edema of right eye   . Cystoid macular edema of right eye   . Depressive disorder   . DVT (deep venous thrombosis) ([place])   . Glaucoma    not on eye drops at this time.  . Hypertension   . Hypertensive  retinopathy of both eyes   . Olecranon bursitis   . Osteoarthritis   . Other disorders of bone and cartilage(733.99)   . Pseudoexfoliation (PXF) glaucoma of both eyes   . Pseudophakia, both eyes   . Seasonal allergies   . Unspecified pruritic disorder     Past Surgical History:  Procedure Laterality Date  . APPENDECTOMY  1958  . BREAST LUMPECTOMY Right 1996   Dr. Corena Herter in [place]  . BREAST LUMPECTOMY Left 2011   Dr. Pleas Patricia  . cataract Bilateral   . CATARACT EXTRACTION Bilateral    Dr. Lucita Ferrara  . COLONOSCOPY  2011-last  . DUPUYTREN CONTRACTURE RELEASE    . HAND SURGERY Right 2020  . LASIK Bilateral 2007  . POLYPECTOMY    . ROTATOR CUFF REPAIR  2007  . TONSILLECTOMY  1967    Family History  Problem Relation Age of Onset  . Stroke Father 4  . Heart disease Brother   . Colon cancer Maternal Aunt 33  . Macular degeneration Mother   . Colon polyps Neg Hx   . Esophageal cancer Neg Hx   . Rectal cancer Neg Hx   . Stomach cancer Neg Hx      Social History   Tobacco Use  Smoking Status Never Smoker    Smokeless Tobacco Never Used    Social History   Substance and Sexual Activity  Alcohol Use Yes  . Alcohol/week: 25.0 standard drinks  . Types: 25 Glasses of wine per week     Allergies  Allergen Reactions  . Latex Shortness Of Breath    Current Outpatient Medications  Medication Sig Dispense Refill  . Albuterol Sulfate (PROAIR RESPICLICK) 979 (90 BASE) MCG/ACT AEPB Inhale 2 puffs into the lungs every 4 (four) hours as needed (SOB). 1 each 0  . Bromfenac Sodium (PROLENSA) 0.07 % SOLN Place 1 drop into the right eye 4 (four) times daily. 6 mL 3  . budesonide (PULMICORT) 180 MCG/ACT inhaler Inhale 2 puffs into the lungs 2 (two) times daily.    . clonazePAM (KLONOPIN) 0.5 MG tablet Take 1 tablet (0.5 mg total) by mouth at bedtime as needed. 30 tablet 0  . dexamethasone (DECADRON) 2 MG tablet Take #5 tablets (58m) by mouth at bedtime night before first chemo and at 6 am day of first chemo 10 tablet 0  . dorzolamide-timolol (COSOPT) 22.3-6.8 MG/ML ophthalmic solution Place 1 drop into the right eye 3 (three) times daily.    . fluorometholone (FML) 0.1 % ophthalmic suspension fluorometholone 0.1 % eye drops,suspension    . fluticasone (FLONASE) 50 MCG/ACT nasal spray Place into the nose.    .Marland KitchenFLUZONE HIGH-DOSE QUADRIVALENT 0.7 ML SUSY     . gabapentin (NEURONTIN) 100 MG capsule Take 100 mg by mouth at bedtime as needed. Patient takes 100 mg at night  3  . hydrochlorothiazide (HYDRODIURIL) 25 MG tablet TAKE 1 TABLET(25 MG) BY MOUTH DAILY 90 tablet 3  . methocarbamol (ROBAXIN) 500 MG tablet Take 1 tablet (500 mg total) by mouth every 8 (eight) hours as needed for muscle spasms. 60 tablet 0  . montelukast (SINGULAIR) 10 MG tablet TAKE 1 TABLET(10 MG) BY MOUTH AT BEDTIME 90 tablet 0  . ondansetron (ZOFRAN) 8 MG tablet Take 1 tablet (8 mg total) by mouth every 8 (eight) hours as needed for nausea or vomiting. 30 tablet 1  . prochlorperazine (COMPAZINE) 10 MG tablet Take 1 tablet (10 mg total)  by mouth every 6 (six) hours as needed.  30 tablet 1  . REFRESH OPTIVE ADVANCED 0.5-1-0.5 % SOLN SMARTSIG:1 Drop(s) In Eye(s) As Needed    . traZODone (DESYREL) 100 MG tablet TAKE 1 TO 2 TABLETS(100 TO 200 MG) BY MOUTH AT BEDTIME AS NEEDED FOR SLEEP 180 tablet 0  . traZODone (DESYREL) 150 MG tablet Take 150 mg by mouth at bedtime.    . triamcinolone cream (KENALOG) 0.1 % Apply 1 application topically 2 (two) times daily. 100 g 0  . valACYclovir (VALTREX) 1000 MG tablet Take 1,000 mg by mouth 3 (three) times daily.    Marland Kitchen venlafaxine XR (EFFEXOR-XR) 150 MG 24 hr capsule TAKE 1 CAPSULE BY MOUTH EVERY MORNING 90 capsule 3  . oxyCODONE (OXY IR/ROXICODONE) 5 MG immediate release tablet Take 1 tablet (5 mg total) by mouth every 4 (four) hours as needed for up to 7 days for severe pain. (Patient not taking: Reported on 01/25/2020) 30 tablet 0   No current facility-administered medications for this visit.      Review of Systems:     Cardiac Review of Systems: [Y] = yes  or   [ N ] = no   Chest Pain [  y  ]  Resting SOB [  n ] Exertional SOB  [n  ]  Orthopnea [ n ]   Pedal Edema [ n  ]    Palpitations [n  ] Syncope  [ n ]   Presyncope [  n ]   General Review of Systems: [Y] = yes [  ]=no Constitional: recent weight change Blue.Reese  ];  Wt loss over the last 3 months [ 129-119   ] anorexia [  ]; fatigue [  ]; nausea [  ]; night sweats [  ]; fever [  ]; or chills [  ];           Eye : blurred vision [  ]; diplopia [   ]; vision changes [  ];  Amaurosis fugax[  ]; Resp: cough [  ];  wheezing[  ];  hemoptysis[  ]; shortness of breath[  ]; paroxysmal nocturnal dyspnea[  ]; dyspnea on exertion[  ]; or orthopnea[  ];  GI:  gallstones[  ], vomiting[  ];  dysphagia[y ]; melena[  ];  hematochezia [  ]; heartburn[ y ];   Hx of  Colonoscopy[  ]; GU: kidney stones [  ]; hematuria[  ];   dysuria [  ];  nocturia[  ];  history of     obstruction [  ]; urinary frequency [  ]             Skin: rash, swelling[  ];, hair loss[  ];   peripheral edema[  ];  or itching[  ]; Musculosketetal: myalgias[  ];  joint swelling[  ];  joint erythema[  ];  joint pain[  ];  back pain[  ]; recent hand surgery   Heme/Lymph: bruising[  ];  bleeding[  ];  anemia[  ];  Neuro: TIA[  ];  headaches[  ];  stroke[  ];  vertigo[  ];  seizures[  ];   paresthesias[  ];  difficulty walking[  ];  Psych:depression[y  ]; anxiety[ y ];  Endocrine: diabetes[  ];  thyroid dysfunction[  ];  Immunizations: Flu up to date Blue.Reese  ]; Pneumococcal up to date [ y ];  Other:  Wt Readings from Last 3 Encounters:  01/25/20 119 lb (54 kg)  01/20/20 119 lb 9.6 oz (54.3 kg)  01/19/20 121  lb (54.9 kg)      PHYSICAL EXAMINATION: BP 126/76 (BP Location: Left Arm, Patient Position: Sitting, Cuff Size: Normal)   Pulse (!) 106   Temp (!) 97.5 F (36.4 C) (Temporal)   Resp 20   Ht _0  (1.6 m)   Wt 119 lb (54 kg)   SpO2 96% Comment: RA  BMI 21.08 kg/m  General appearance: alert, cooperative and no distress Head: Normocephalic, without obvious abnormality, atraumatic Neck: no adenopathy, no carotid bruit, no JVD, supple, symmetrical, trachea midline and thyroid not enlarged, symmetric, no tenderness/mass/nodules Lymph nodes: Cervical, supraclavicular, and axillary nodes normal. Resp: clear to auscultation bilaterally Back: symmetric, no curvature. ROM normal. No CVA tenderness. Cardio: regular rate and rhythm, S1, S2 normal, no murmur, click, rub or gallop GI: soft, non-tender; bowel sounds normal; no masses,  no organomegaly Extremities: extremities normal, atraumatic, no cyanosis or edema and Homans sign is negative, no sign of DVT Neurologic: Grossly normal  Diagnostic Studies & Laboratory data:     Recent Radiology Findings:   CT CHEST WO CONTRAST  Result Date: 12/30/2019 CLINICAL DATA:  Chest pain and shortness of breath for 4 months, back pain, history of bilateral breast cancer EXAM: CT CHEST WITHOUT CONTRAST TECHNIQUE: Multidetector CT imaging of  the chest was performed following the standard protocol without IV contrast. COMPARISON:  11/28/2014 FINDINGS: Cardiovascular: Unenhanced imaging of the heart and great vessels demonstrates mild atherosclerosis of the aortic arch. No pericardial effusion. Mediastinum/Nodes: No enlarged mediastinal or axillary lymph nodes. Thyroid gland, trachea, and esophagus demonstrate no significant findings. Lungs/Pleura: No acute airspace disease, effusion, or pneumothorax. No pulmonary nodules or masses. Central airways are widely patent. Upper Abdomen: Stable fatty infiltration of the liver. Otherwise no acute process. Musculoskeletal: Marked rotatory scoliosis of the thoracolumbar spine is stable. There is new sclerosis within the medial head of the right clavicle, which is likely degenerative given progressive joint space narrowing at the right sternoclavicular joint. If bony metastases are suspected or are clinically important to document, bone scan could be considered. Reconstructed images demonstrate no additional findings. IMPRESSION: 1. No acute cardiopulmonary disease. 2. Stable fatty infiltration of the liver. 3. New sclerosis within the medial head of the right clavicle is likely degenerative given progressive joint space narrowing at the right sternoclavicular joint. If bony metastases are suspected or are clinically important to document, bone scan could be considered. 4. Aortic Atherosclerosis (ICD10-I70.0). Electronically Signed   By: Randa Ngo M.D.   On: 12/30/2019 11:36   NM PET Image Initial (PI) Skull Base To Thigh  Result Date: 01/24/2020 CLINICAL DATA:  Initial treatment strategy for squamous cell carcinoma lower third of the esophagus. EXAM: NUCLEAR MEDICINE PET SKULL BASE TO THIGH TECHNIQUE: 6.6 mCi F-18 FDG was injected intravenously. Full-ring PET imaging was performed from the skull base to thigh after the radiotracer. CT data was obtained and used for attenuation correction and anatomic  localization. Fasting blood glucose: 108 mg/dl COMPARISON:  None. FINDINGS: Mediastinal blood pool activity: SUV max Liver activity: SUV max NA NECK: Incidental CT findings: none CHEST: Long segment of intense circumferential metabolic activity involving the distal esophagus. The hypermetabolic lesion measures approximately 4 cm and positioned 6 cm from GE junction. Lesion extends to the level of the carina. There is thickening of the esophagus associated with the intense metabolic activity (SUV max equal 19.4). Hypermetabolic lymph node LEFT adjacent to the esophageal lesion just anterior to the descending thoracic aorta. This node is difficult to see on noncontrast CT  but is intense with SUV max equal 8.5. Hypermetabolic lymph node in the higher RIGHT paratracheal nodal station with SUV max equal 5.1 (image 40). Third lymph node anterior to the esophagus just above the carina with SUV max equal 4.2. No suspicious pulmonary nodules. Incidental CT findings: none ABDOMEN/PELVIS: Low-attenuation liver suggests mild hepatic steatosis. No abnormal metabolic activity liver. There is a hypermetabolic lymph node in the gastrohepatic ligament measuring 10 mm with SUV max equal 3.3 No distal retroperitoneal adenopathy or pelvic adenopathy. Incidental CT findings: Lobular uterus calcifications no abnormal metabolic activity. SKELETON: No focal hypermetabolic activity to suggest skeletal metastasis. Incidental CT findings: Scoliosis noted IMPRESSION: 1. Long segment of hypermetabolic thickening of the distal esophagus consistent with primary esophageal carcinoma as described above. 2. Four hypermetabolic metastatic lymph nodes. Three lymph nodes in the mediastinum and one in the upper abdomen gastrohepatic ligament. 3. No evidence of liver metastasis.  Hepatic steatosis noted. Electronically Signed   By: Suzy Bouchard M.D.   On: 01/24/2020 16:42   MM 3D SCREEN BREAST BILATERAL  Result Date: 01/12/2020 CLINICAL DATA:   Screening. EXAM: DIGITAL SCREENING BILATERAL MAMMOGRAM WITH TOMO AND CAD COMPARISON:  Previous exam(s). ACR Breast Density Category b: There are scattered areas of fibroglandular density. FINDINGS: There are no findings suspicious for malignancy. Images were processed with CAD. IMPRESSION: No mammographic evidence of malignancy. A result letter of this screening mammogram will be mailed directly to the patient. RECOMMENDATION: Screening mammogram in one year. (Code:[id number]-01Y) BI-RADS CATEGORY  1: Negative. Electronically Signed   By: Dorise Bullion III M.D   On: 01/12/2020 16:37     I have independently reviewed the above radiology studies  and reviewed the findings with the patient. -My review the CT scan of the chest shows definite abnormal esophagus with significant thickening of the midportion of the esophagus-this is been referred back to radiology for review  Recent Lab Findings: Lab Results  Component Value Date   WBC 11.3 (H) 11/29/2019   HGB 14.7 11/29/2019   HCT 43.7 11/29/2019   PLT 289.0 11/29/2019   GLUCOSE 102 (H) 01/24/2020   CHOL 179 09/24/2019   TRIG 102.0 09/24/2019   HDL 61.90 09/24/2019   LDLCALC 96 09/24/2019   ALT 17 01/24/2020   AST 23 01/24/2020   NA 141 01/24/2020   K 3.5 01/24/2020   CL 100 01/24/2020   CREATININE 0.72 01/24/2020   BUN 15 01/24/2020   CO2 32 01/24/2020   TSH 2.920 06/09/2014   - A large, ulcerating mass was found in the proximal esophagus and in the mid esophagus, 26 to 31 cm from the incisors. The mass was partially obstructing and partially circumferential (involving two thirds of the lumen circumference). Biopsies were taken with a cold forceps for histology. Verification of patient identification for the specimen was done. Estimated blood loss was minimal.   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 on PET scan but unable to  completely stage But is obvious not early stage.  The patient's overall medical condition would allow for consideration of esophagectomy following treatment with radiation and chemo. To fully stage a esophageal ultrasound and possible lymph node biopsies would be preferable.  I discussed with the patient and her husband the place for surgical resection of the esophagus in the setting of her advanced disease but without evidence of distant metastasis.  Printed material concerning esophagectomy was given to the  patient. -I will plan to see her back in 4-5 weeks to evaluate how her progress with treatment is going.  If indicated to her and her husband if we proceed with surgical resection would be 6 to 8 weeks after the completion of radiation.  Cancer Staging Primary squamous cell carcinoma of overlapping sites of esophagus [facility]) Staging form: Esophagus - Squamous Cell Carcinoma, AJCC 8th Edition - Clinical stage from 01/25/2020: Stage Unknown (cTX, cN2, cM0, G2, L: Upper) - Signed by Grace Isaac, MD on 01/25/2020  2/Right breast cancer July 1996, [name], [name], right lumpectomy and axillary dissection followed by adjuvant AC chemotherapy-4 cycles, 5 years of tamoxifen, and breast radiation 3/Left breast DCIS January 2011 -1.1 cm, [name], [name], left breast lumpectomy followed by adjuvant radiation and 5 years of tamoxifen   4/history of hypertension   5/history of asthma  Grace Isaac MD      .[address] [place] [address] Office [phone]     01/25/2020 1:01 PM

## 2020-01-25 NOTE — Telephone Encounter (Signed)
I called and spoke with Jo Morrison and let her know the PET imaging results and Dr. Ida Rogue recommendation for chemoRT given the nodal involvement. She is going to see Dr. Servando Snare today which I think will be helpful to determine if surgery after chemoRT is feasible. She is already scheduled for simulation tomorrow and we will proceed with chemoRT on 01/31/20.

## 2020-01-26 ENCOUNTER — Ambulatory Visit (HOSPITAL_COMMUNITY): Payer: PPO

## 2020-01-26 ENCOUNTER — Other Ambulatory Visit: Payer: Self-pay

## 2020-01-26 ENCOUNTER — Ambulatory Visit
Admission: RE | Admit: 2020-01-26 | Discharge: 2020-01-26 | Disposition: A | Discharge status: Home / Self Care | Payer: PPO | Source: Ambulatory Visit | Attending: Radiation Oncology | Admitting: Radiation Oncology

## 2020-01-26 ENCOUNTER — Telehealth: Payer: Self-pay | Admitting: Oncology

## 2020-01-26 ENCOUNTER — Telehealth: Payer: Self-pay

## 2020-01-26 DIAGNOSIS — Z510 Encounter for antineoplastic radiation therapy: Secondary | ICD-10-CM | POA: Diagnosis not present

## 2020-01-26 DIAGNOSIS — C158 Malignant neoplasm of overlapping sites of esophagus: Secondary | ICD-10-CM | POA: Diagnosis not present

## 2020-01-26 DIAGNOSIS — C159 Malignant neoplasm of esophagus, unspecified: Secondary | ICD-10-CM | POA: Insufficient documentation

## 2020-01-26 DIAGNOSIS — D0512 Intraductal carcinoma in situ of left breast: Secondary | ICD-10-CM

## 2020-01-26 DIAGNOSIS — C50511 Malignant neoplasm of lower-outer quadrant of right female breast: Secondary | ICD-10-CM

## 2020-01-26 NOTE — Telephone Encounter (Signed)
EUS scheduled, pt instructed and medications reviewed.  Patient instructions mailed to home.  Patient to call with any questions or concerns. Covid testing also advised.  Information also sent to the pt My Chart per pt request

## 2020-01-26 NOTE — Telephone Encounter (Signed)
-----   Message from Milus Banister, MD sent at 01/26/2020  5:18 AM EST ----- Regarding: RE: EUS request Thanks.  I can add this to 3/18 schedule.   Oshua Mcconaha, Please add to my 3/18 schedule or with Gabe if he has sooner availability.  Dx; esophageal cancer staging.  Upper EUS.  Thanks  Wynetta Fines  ----- Message ----- From: Gatha Mayer, MD Sent: 01/25/2020   3:47 PM EST To: Milus Banister, MD, Kyung Rudd, MD, # Subject: EUS request                                    Ed,  I am ccing Oretha Caprice and Gabe Mansouraty to see about the EUS.  Glendell Docker ----- Message ----- From: Grace Isaac, MD Sent: 01/25/2020   1:13 PM EST To: Kyung Rudd, MD, Hayden Pedro, PA-C, #  Please see this recent information about our joint patient. Glendell Docker can you arrange EUS poss node bx for complete staging?  Lilia Argue  Gerhardt MD [facility] [address] [place]  [place] [address]        [phone] office        [phone] beeper

## 2020-01-26 NOTE — Telephone Encounter (Signed)
Scheduled appt per 3/3 sch message - pt is aware of appt date and time   

## 2020-01-26 NOTE — Progress Notes (Signed)
When I consented the patient I reviewed discussion from conference this morning and that Dr. Benay Spice plans to follow up with Dr. Servando Snare. She has not had genetic testing which was brought up given her prior breast history. She is interested and a referral will be made to genetics clinic.

## 2020-01-27 ENCOUNTER — Ambulatory Visit (INDEPENDENT_AMBULATORY_CARE_PROVIDER_SITE_OTHER): Payer: PPO | Admitting: Physician Assistant

## 2020-01-27 ENCOUNTER — Encounter: Payer: Self-pay | Admitting: Cardiothoracic Surgery

## 2020-01-27 ENCOUNTER — Telehealth: Payer: Self-pay | Admitting: Radiation Oncology

## 2020-01-27 ENCOUNTER — Encounter: Payer: Self-pay | Admitting: Physician Assistant

## 2020-01-27 ENCOUNTER — Other Ambulatory Visit: Payer: Self-pay

## 2020-01-27 DIAGNOSIS — F3341 Major depressive disorder, recurrent, in partial remission: Secondary | ICD-10-CM | POA: Diagnosis not present

## 2020-01-27 DIAGNOSIS — F411 Generalized anxiety disorder: Secondary | ICD-10-CM

## 2020-01-27 DIAGNOSIS — G4700 Insomnia, unspecified: Secondary | ICD-10-CM

## 2020-01-27 MED ORDER — TRAZODONE HCL 100 MG PO TABS: ORAL_TABLET | ORAL | 3 refills | Status: DC

## 2020-01-27 MED ORDER — B COMPLEX VITAMINS PO CAPS: 1.0000 | ORAL_CAPSULE | Freq: Every day | ORAL | 11 refills | Status: DC

## 2020-01-27 MED ORDER — MULTIPLE VITAMINS/WOMENS PO TABS: 1.0000 | ORAL_TABLET | Freq: Every day | ORAL | 11 refills | Status: DC

## 2020-01-27 MED ORDER — VITAMIN D 50 MCG (2000 UT) PO CAPS: 2000.0000 [IU] | ORAL_CAPSULE | Freq: Every day | ORAL | 11 refills | Status: DC

## 2020-01-27 MED ORDER — VENLAFAXINE HCL ER 150 MG PO CP24: 150.0000 mg | ORAL_CAPSULE | Freq: Every morning | ORAL | 3 refills | Status: DC

## 2020-01-27 MED ORDER — CLONAZEPAM 0.5 MG PO TABS: 0.5000 mg | ORAL_TABLET | Freq: Every evening | ORAL | 0 refills | Status: DC | PRN

## 2020-01-27 NOTE — Telephone Encounter (Signed)
I called the patient to let her know that I had spoken with Dr. Lisbeth Renshaw and Dr. Benay Spice. The timing of her EUS on 3/18 would be almost 2 weeks into her 5 1/2 week regimen of chemoRT, and we're concerned that a sample at that time may not be a reliable representation of her disease status. She was offered options to hold treatment until 3/15, but she states that she would really like to proceed on 3/8. I told her I would leave her schedule as is, and that I would check with GI to see if EUS could be any sooner than 3/15. If it can't be done any sooner, she is contemplating cancelling the procedure.

## 2020-01-27 NOTE — Progress Notes (Signed)
[facility] Med Check  Patient ID: Jo Morrison,  MRN: [id number]  PCP: Hoyt Koch, MD  Date of Evaluation: 01/27/2020 Time spent:20 minutes  Chief Complaint:  Chief Complaint    Anxiety; Depression; Insomnia      HISTORY/CURRENT STATUS: HPI For annual med check.  Was dx with esophageal cancer 2 weeks ago.  Starting chemo and radiation on the 8th.  "I was doing fine up until then.  And I feel like everything will be fine from here on out.  It is just something I have to go through."  The malignancy was found on endoscopy.  She had been having pain in her back and mid chest for quite a while and had lost about 10 pounds.  Even with everything that is going on, she feels that her medications are working well.  No acute symptoms of depression.  She is able to enjoy things.  Energy and motivation are good for her.  Not crying easily.  No SI/HI.  Anxiety is not often a problem for her.  She has the Klonopin on hand if needed but rarely uses it.  She has not needed a prescription since 01/2019, and states she did not use all 30 of those.  She sleeps well for the most part.  She did increase the dose of the trazodone recently from 100 mg to 150 mg which has been beneficial.  Denies dizziness, syncope, seizures, numbness, tingling, tremor, tics, unsteady gait, slurred speech, confusion. Denies muscle or joint pain, stiffness, or dystonia.  Individual Medical History/ Review of Systems: Changes? :Yes Diagnosed with esophageal cancer 2 weeks ago.  Past medications for mental health diagnoses include: Effexor XR, trazodone, Klonopin, Sonata  Allergies: Latex  Current Medications:  Current Outpatient Medications:  .  Albuterol Sulfate (PROAIR RESPICLICK) 123XX123 (90 BASE) MCG/ACT AEPB, Inhale 2 puffs into the lungs every 4 (four) hours as needed (SOB)., Disp: 1 each, Rfl: 0 .  Bromfenac Sodium (PROLENSA) 0.07 % SOLN, Place 1 drop into the right eye 4 (four) times daily., Disp: 6 mL,  Rfl: 3 .  budesonide (PULMICORT) 180 MCG/ACT inhaler, Inhale 2 puffs into the lungs 2 (two) times daily., Disp: , Rfl:  .  clonazePAM (KLONOPIN) 0.5 MG tablet, Take 1 tablet (0.5 mg total) by mouth at bedtime as needed., Disp: 30 tablet, Rfl: 0 .  dexamethasone (DECADRON) 2 MG tablet, Take #5 tablets (10mg ) by mouth at bedtime night before first chemo and at 6 am day of first chemo, Disp: 10 tablet, Rfl: 0 .  dorzolamide-timolol (COSOPT) 22.3-6.8 MG/ML ophthalmic solution, Place 1 drop into the right eye 3 (three) times daily., Disp: , Rfl:  .  fluorometholone (FML) 0.1 % ophthalmic suspension, fluorometholone 0.1 % eye drops,suspension, Disp: , Rfl:  .  fluticasone (FLONASE) 50 MCG/ACT nasal spray, Place into the nose., Disp: , Rfl:  .  FLUZONE HIGH-DOSE QUADRIVALENT 0.7 ML SUSY, , Disp: , Rfl:  .  gabapentin (NEURONTIN) 100 MG capsule, Take 100 mg by mouth at bedtime as needed. Patient takes 100 mg at night, Disp: , Rfl: 3 .  hydrochlorothiazide (HYDRODIURIL) 25 MG tablet, TAKE 1 TABLET(25 MG) BY MOUTH DAILY, Disp: 90 tablet, Rfl: 3 .  montelukast (SINGULAIR) 10 MG tablet, TAKE 1 TABLET(10 MG) BY MOUTH AT BEDTIME, Disp: 90 tablet, Rfl: 0 .  ondansetron (ZOFRAN) 8 MG tablet, Take 1 tablet (8 mg total) by mouth every 8 (eight) hours as needed for nausea or vomiting., Disp: 30 tablet, Rfl: 1 .  prochlorperazine (COMPAZINE)  10 MG tablet, Take 1 tablet (10 mg total) by mouth every 6 (six) hours as needed., Disp: 30 tablet, Rfl: 1 .  REFRESH OPTIVE ADVANCED 0.5-1-0.5 % SOLN, SMARTSIG:1 Drop(s) In Eye(s) As Needed, Disp: , Rfl:  .  traZODone (DESYREL) 100 MG tablet, TAKE 1 TO 2 TABLETS(100 TO 200 MG) BY MOUTH AT BEDTIME AS NEEDED FOR SLEEP, Disp: 180 tablet, Rfl: 3 .  triamcinolone cream (KENALOG) 0.1 %, Apply 1 application topically 2 (two) times daily., Disp: 100 g, Rfl: 0 .  valACYclovir (VALTREX) 1000 MG tablet, Take 1,000 mg by mouth 3 (three) times daily., Disp: , Rfl:  .  venlafaxine XR  (EFFEXOR-XR) 150 MG 24 hr capsule, Take 1 capsule (150 mg total) by mouth every morning., Disp: 90 capsule, Rfl: 3 .  b complex vitamins capsule, Take 1 capsule by mouth daily., Disp: 30 capsule, Rfl: 11 .  Cholecalciferol (VITAMIN D) 50 MCG (2000 UT) CAPS, Take 1 capsule (2,000 Units total) by mouth daily., Disp: 30 capsule, Rfl: 11 .  methocarbamol (ROBAXIN) 500 MG tablet, Take 1 tablet (500 mg total) by mouth every 8 (eight) hours as needed for muscle spasms. (Patient not taking: Reported on 01/27/2020), Disp: 60 tablet, Rfl: 0 .  Multiple Vitamins-Minerals (MULTIPLE VITAMINS/WOMENS) tablet, Take 1 tablet by mouth daily., Disp: 30 tablet, Rfl: 11 Medication Side Effects: none  Family Medical/ Social History: Changes? No  MENTAL HEALTH EXAM:  There were no vitals taken for this visit.There is no height or weight on file to calculate BMI.  General Appearance: Casual, Neat and Well Groomed  Eye Contact:  Good  Speech:  Clear and Coherent and Normal Rate  Volume:  Normal  Mood:  Euthymic  Affect:  Appropriate  Thought Process:  Goal Directed and Descriptions of Associations: Intact  Orientation:  Full (Time, Place, and Person)  Thought Content: Logical   Suicidal Thoughts:  No  Homicidal Thoughts:  No  Memory:  WNL  Judgement:  Good  Insight:  Good  Psychomotor Activity:  Normal  Concentration:  Concentration: Good  Recall:  Good  Fund of Knowledge: Good  Language: Good  Assets:  Desire for Improvement  ADL's:  Intact  Cognition: WNL  Prognosis:  Good    DIAGNOSES:    ICD-10-CM   1. Recurrent major depressive disorder, in partial remission ([place])  F33.41   2. Generalized anxiety disorder  F41.1   3. Insomnia, unspecified type  G47.00     Receiving Psychotherapy: No    RECOMMENDATIONS:  I am sorry to hear about the recent diagnosis and I wish her well throughout the treatment. PDMP was reviewed. I spent 20 minutes with her. Continue Effexor XR 150 mg 1 every  morning. Continue trazodone 100 mg, 1-2 nightly as needed sleep. Continue gabapentin 100 mg, 1 p.o. nightly (she takes for cough) Continue Klonopin 0.5 mg 1 daily as needed. Start a multivitamin, B complex, and vitamin D.  These are good for her mental and physical health and I think they will be beneficial during the chemotherapy and radiation. We usually only see each other annually.  This time, I think we should see each other sooner just to make sure she is okay after she finishes the chemo and radiation.  If needed, she can call anytime and we can increase the Effexor XR if needed, or even start Ambien or another sleep aid if needed. Return in 3 months.  Donnal Moat, PA-C

## 2020-01-28 ENCOUNTER — Other Ambulatory Visit: Payer: Self-pay | Admitting: *Deleted

## 2020-01-28 DIAGNOSIS — C159 Malignant neoplasm of esophagus, unspecified: Secondary | ICD-10-CM

## 2020-01-30 ENCOUNTER — Other Ambulatory Visit: Payer: Self-pay | Admitting: Oncology

## 2020-01-31 ENCOUNTER — Ambulatory Visit
Admission: RE | Admit: 2020-01-31 | Discharge: 2020-01-31 | Disposition: A | Discharge status: Home / Self Care | Payer: PPO | Source: Ambulatory Visit | Attending: Radiation Oncology | Admitting: Radiation Oncology

## 2020-01-31 ENCOUNTER — Inpatient Hospital Stay: Payer: PPO | Admitting: Nurse Practitioner

## 2020-01-31 ENCOUNTER — Other Ambulatory Visit: Payer: Self-pay

## 2020-01-31 ENCOUNTER — Encounter: Payer: Self-pay | Admitting: Nurse Practitioner

## 2020-01-31 ENCOUNTER — Inpatient Hospital Stay: Payer: PPO

## 2020-01-31 VITALS — BP 166/97 | HR 98 | Temp 97.8°F | Resp 17 | Ht 63.0 in | Wt 120.1 lb

## 2020-01-31 DIAGNOSIS — C159 Malignant neoplasm of esophagus, unspecified: Secondary | ICD-10-CM

## 2020-01-31 DIAGNOSIS — Z5111 Encounter for antineoplastic chemotherapy: Secondary | ICD-10-CM | POA: Diagnosis not present

## 2020-01-31 DIAGNOSIS — Z510 Encounter for antineoplastic radiation therapy: Secondary | ICD-10-CM | POA: Diagnosis not present

## 2020-01-31 DIAGNOSIS — C158 Malignant neoplasm of overlapping sites of esophagus: Secondary | ICD-10-CM | POA: Diagnosis not present

## 2020-01-31 LAB — CMP (CANCER CENTER ONLY)
ALT: 19 U/L (ref 0–44)
AST: 22 U/L (ref 15–41)
Albumin: 3.9 g/dL (ref 3.5–5.0)
Alkaline Phosphatase: 79 U/L (ref 38–126)
Anion gap: 9 (ref 5–15)
BUN: 13 mg/dL (ref 8–23)
CO2: 33 mmol/L — ABNORMAL HIGH (ref 22–32)
Calcium: 9.3 mg/dL (ref 8.9–10.3)
Chloride: 98 mmol/L (ref 98–111)
Creatinine: 0.75 mg/dL (ref 0.44–1.00)
GFR, Est AFR Am: >60 mL/min (ref >60)
GFR, Estimated: >60 mL/min (ref >60)
Glucose, Bld: 117 mg/dL — ABNORMAL HIGH (ref 70–99)
Potassium: 3.6 mmol/L (ref 3.5–5.1)
Sodium: 140 mmol/L (ref 135–145)
Total Bilirubin: 0.4 mg/dL (ref 0.3–1.2)
Total Protein: 7.4 g/dL (ref 6.5–8.1)

## 2020-01-31 LAB — CBC WITH DIFFERENTIAL (CANCER CENTER ONLY)
Abs Immature Granulocytes: 0.05 10*3/uL (ref 0.00–0.07)
Basophils Absolute: 0.1 10*3/uL (ref 0.0–0.1)
Basophils Relative: 1 %
Eosinophils Absolute: 0.2 10*3/uL (ref 0.0–0.5)
Eosinophils Relative: 2 %
HCT: 43 % (ref 36.0–46.0)
Hemoglobin: 14.5 g/dL (ref 12.0–15.0)
Immature Granulocytes: 1 %
Lymphocytes Relative: 12 %
Lymphs Abs: 1.3 10*3/uL (ref 0.7–4.0)
MCH: 32.4 pg (ref 26.0–34.0)
MCHC: 33.7 g/dL (ref 30.0–36.0)
MCV: 96 fL (ref 80.0–100.0)
Monocytes Absolute: 0.8 10*3/uL (ref 0.1–1.0)
Monocytes Relative: 8 %
Neutro Abs: 8.1 10*3/uL — ABNORMAL HIGH (ref 1.7–7.7)
Neutrophils Relative %: 76 %
Platelet Count: 321 10*3/uL (ref 150–400)
RBC: 4.48 MIL/uL (ref 3.87–5.11)
RDW: 13.5 % (ref 11.5–15.5)
WBC Count: 10.6 10*3/uL — ABNORMAL HIGH (ref 4.0–10.5)
nRBC: 0 % (ref 0.0–0.2)

## 2020-01-31 NOTE — Progress Notes (Addendum)
[facility] OFFICE PROGRESS NOTE   Diagnosis: Esophagus cancer  INTERVAL HISTORY:   Jo Morrison returns as scheduled.  She developed dysphagia over the past few days.  She is able to tolerate soft solids.  Some odynophagia as well.  No nausea or vomiting.  Bowels are moving.  Objective:  Vital signs in last 24 hours:  Blood pressure (!) 166/97, pulse 98, [name], temperature source Temporal, resp. rate 17, height _0  (1.6 m), weight 120 lb 1.6 oz (54.5 kg), SpO2 99 %.    HEENT: No thrush or ulcers. Lymphatics: No palpable cervical or supraclavicular lymph nodes. Resp: Lungs clear bilaterally. Cardio: Regular rate and rhythm. GI: Abdomen soft and nontender.  No hepatomegaly. Vascular: No leg edema. Neuro: Alert and oriented. Skin: No rash.   Lab Results:  Lab Results  Component Value Date   WBC 10.6 (H) 01/31/2020   HGB 14.5 01/31/2020   HCT 43.0 01/31/2020   MCV 96.0 01/31/2020   PLT 321 01/31/2020   NEUTROABS 8.1 (H) 01/31/2020    Imaging:  No results found.  Medications: I have reviewed the patient's current medications.  Assessment/Plan: 1. 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 Upper endoscopy 01/14/2020-ulcerated mass in the proximal/mid esophagus at 26-31 cm from the incisors, biopsy confirmed invasive moderately differentiated squamous cell carcinoma ? CT abdomen/pelvis 12/09/2019-uterine fibroids ? CT chest 12/30/2019-normal esophagus, new sclerosis of the medial head of the right clavicle likely degenerative ? PET scan 01/24/2020-long segment of hypermetabolic thickening of the distal esophagus.  For hypermetabolic metastatic lymph nodes, 3 in the mediastinum and 1 in the upper abdomen gastrohepatic ligament. ? Radiation 01/31/2020 ? Cycle 1 weekly Taxol/carboplatin 02/01/2020 2. Right breast cancer July 1996, [name], [name], [name], 5 years of tamoxifen, 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, macular edema, and corneal edema  7.  Asthma  Disposition: Jo Morrison appears to have locally advanced esophagus cancer.  Dr. Benay Spice reviewed the PET scan report and images with her at today's visit.  We discussed the indication for an EUS prior to proceeding with treatment.  She does not want to delay beginning treatment.  She will begin radiation today.  She will return on 02/01/2020 for cycle 1 weekly Taxol/carboplatin.  We again reviewed potential toxicities associated with the chemotherapy.  She agrees to proceed.  We reviewed the CBC and chemistry panel from today.  Labs are adequate to proceed as above.  She has developed mild dysphagia.  She will continue soft solids, [name], follow-up, cycle 2 Taxol/carboplatin on 02/08/2020.  She will contact the office in the interim with any problems.  Patient seen with Dr. Benay Spice.  Ned Card ANP/GNP-BC   01/31/2020  2:10 PM This was a shared visit with Ned Card.  Jo Morrison has developed increased dysphagia.  She would like to begin treatment today.  We discussed delaying treatment and proceeding with a staging EUS, [name], MD

## 2020-01-31 NOTE — Progress Notes (Signed)
Met with patient at registration to introduce myself as Arboriculturist and to offer available resources.  Discussed one-time $1000 Radio broadcast assistant to assist with personal expenses while going through treatment.  Gave her my card if interested in applying and for any additional financial questions or concerns.

## 2020-02-01 ENCOUNTER — Ambulatory Visit
Admission: RE | Admit: 2020-02-01 | Discharge: 2020-02-01 | Disposition: A | Discharge status: Home / Self Care | Payer: PPO | Source: Ambulatory Visit | Attending: Radiation Oncology | Admitting: Radiation Oncology

## 2020-02-01 ENCOUNTER — Telehealth: Payer: Self-pay | Admitting: Oncology

## 2020-02-01 ENCOUNTER — Inpatient Hospital Stay: Payer: PPO

## 2020-02-01 ENCOUNTER — Other Ambulatory Visit: Payer: Self-pay

## 2020-02-01 VITALS — BP 165/100 | HR 91 | Temp 98.6°F | Resp 18

## 2020-02-01 DIAGNOSIS — C158 Malignant neoplasm of overlapping sites of esophagus: Secondary | ICD-10-CM | POA: Diagnosis not present

## 2020-02-01 DIAGNOSIS — Z510 Encounter for antineoplastic radiation therapy: Secondary | ICD-10-CM | POA: Diagnosis not present

## 2020-02-01 DIAGNOSIS — Z5111 Encounter for antineoplastic chemotherapy: Secondary | ICD-10-CM | POA: Diagnosis not present

## 2020-02-01 MED ORDER — SODIUM CHLORIDE 0.9 % IV SOLN
Freq: Once | INTRAVENOUS | Status: AC
  Filled 2020-02-01: qty 250

## 2020-02-01 MED ORDER — SODIUM CHLORIDE 0.9 % IV SOLN
134.6000 mg | Freq: Once | INTRAVENOUS | Status: AC
  Administered 2020-02-01: 130 mg via INTRAVENOUS
  Filled 2020-02-01: qty 13

## 2020-02-01 MED ORDER — DIPHENHYDRAMINE HCL 50 MG/ML IJ SOLN
INTRAMUSCULAR | Status: AC
  Filled 2020-02-01: qty 1

## 2020-02-01 MED ORDER — FAMOTIDINE IN NACL 20-0.9 MG/50ML-% IV SOLN
INTRAVENOUS | Status: AC
  Filled 2020-02-01: qty 50

## 2020-02-01 MED ORDER — PALONOSETRON HCL INJECTION 0.25 MG/5ML
INTRAVENOUS | Status: AC
  Filled 2020-02-01: qty 5

## 2020-02-01 MED ORDER — SODIUM CHLORIDE 0.9 % IV SOLN
20.0000 mg | Freq: Once | INTRAVENOUS | Status: AC
  Administered 2020-02-01: 20 mg via INTRAVENOUS
  Filled 2020-02-01: qty 20

## 2020-02-01 MED ORDER — FAMOTIDINE IN NACL 20-0.9 MG/50ML-% IV SOLN
20.0000 mg | Freq: Once | INTRAVENOUS | Status: AC
  Administered 2020-02-01: 20 mg via INTRAVENOUS

## 2020-02-01 MED ORDER — PALONOSETRON HCL INJECTION 0.25 MG/5ML
0.2500 mg | Freq: Once | INTRAVENOUS | Status: AC
  Administered 2020-02-01: 0.25 mg via INTRAVENOUS

## 2020-02-01 MED ORDER — SODIUM CHLORIDE 0.9 % IV SOLN
50.0000 mg/m2 | Freq: Once | INTRAVENOUS | Status: AC
  Administered 2020-02-01: 78 mg via INTRAVENOUS
  Filled 2020-02-01: qty 13

## 2020-02-01 MED ORDER — DIPHENHYDRAMINE HCL 50 MG/ML IJ SOLN
25.0000 mg | Freq: Once | INTRAMUSCULAR | Status: AC
  Administered 2020-02-01: 25 mg via INTRAVENOUS

## 2020-02-01 NOTE — Patient Instructions (Signed)
[facility] Discharge Instructions for Patients Receiving Chemotherapy  Today you received the following chemotherapy agents: paclitaxel and carboplatin  To help prevent nausea and vomiting after your treatment, we encourage you to take your nausea medication as prescribed by your physician.    If you develop nausea and vomiting that is not controlled by your nausea medication, call the clinic.   BELOW ARE SYMPTOMS THAT SHOULD BE REPORTED IMMEDIATELY:  *FEVER GREATER THAN 100.5 F  *CHILLS WITH OR WITHOUT FEVER  NAUSEA AND VOMITING THAT IS NOT CONTROLLED WITH YOUR NAUSEA MEDICATION  *UNUSUAL SHORTNESS OF BREATH  *UNUSUAL BRUISING OR BLEEDING  TENDERNESS IN MOUTH AND THROAT WITH OR WITHOUT PRESENCE OF ULCERS  *URINARY PROBLEMS  *BOWEL PROBLEMS  UNUSUAL RASH Items with * indicate a potential emergency and should be followed up as soon as possible.  Feel free to call the clinic should you have any questions or concerns. The clinic phone number is [phone].  Please show the [place] at check-in to the Emergency Department and triage nurse.  Paclitaxel injection What is this medicine? PACLITAXEL (PAK li TAX el) is a chemotherapy drug. It targets fast dividing cells, like cancer cells, and causes these cells to die. This medicine is used to treat ovarian cancer, breast cancer, lung cancer, Kaposi's sarcoma, and other cancers. This medicine may be used for other purposes; ask your health care provider or pharmacist if you have questions. COMMON BRAND NAME(S): Onxol, Taxol What should I tell my health care provider before I take this medicine? They need to know if you have any of these conditions:  history of irregular heartbeat  liver disease  low blood counts, like low white cell, platelet, or red cell counts  lung or breathing disease, like asthma  tingling of the fingers or toes, or other nerve disorder  an unusual or allergic reaction to  paclitaxel, alcohol, polyoxyethylated castor oil, other chemotherapy, other medicines, foods, dyes, or preservatives  pregnant or trying to get pregnant  breast-feeding How should I use this medicine? This drug is given as an infusion into a vein. It is administered in a hospital or clinic by a specially trained health care professional. Talk to your pediatrician regarding the use of this medicine in children. Special care may be needed. Overdosage: If you think you have taken too much of this medicine contact a poison control center or emergency room at once. NOTE: This medicine is only for you. Do not share this medicine with others. What if I miss a dose? It is important not to miss your dose. Call your doctor or health care professional if you are unable to keep an appointment. What may interact with this medicine? Do not take this medicine with any of the following medications:  disulfiram  metronidazole This medicine may also interact with the following medications:  antiviral medicines for hepatitis, HIV or AIDS  certain antibiotics like erythromycin and clarithromycin  certain medicines for fungal infections like ketoconazole and itraconazole  certain medicines for seizures like carbamazepine, phenobarbital, phenytoin  gemfibrozil  nefazodone  rifampin  St. John's wort This list may not describe all possible interactions. Give your health care provider a list of all the medicines, herbs, non-prescription drugs, or dietary supplements you use. Also tell them if you smoke, drink alcohol, or use illegal drugs. Some items may interact with your medicine. What should I watch for while using this medicine? Your condition will be monitored carefully while you are receiving this medicine.  You will need important blood work done while you are taking this medicine. This medicine can cause serious allergic reactions. To reduce your risk you will need to take other medicine(s)  before treatment with this medicine. If you experience allergic reactions like skin rash, itching or hives, swelling of the face, lips, or tongue, tell your doctor or health care professional right away. In some cases, you may be given additional medicines to help with side effects. Follow all directions for their use. This drug may make you feel generally unwell. This is not uncommon, as chemotherapy can affect healthy cells as well as cancer cells. Report any side effects. Continue your course of treatment even though you feel ill unless your doctor tells you to stop. Call your doctor or health care professional for advice if you get a fever, chills or sore throat, or other symptoms of a cold or flu. Do not treat yourself. This drug decreases your body's ability to fight infections. Try to avoid being around people who are sick. This medicine may increase your risk to bruise or bleed. Call your doctor or health care professional if you notice any unusual bleeding. Be careful brushing and flossing your teeth or using a toothpick because you may get an infection or bleed more easily. If you have any dental work done, tell your dentist you are receiving this medicine. Avoid taking products that contain aspirin, acetaminophen, ibuprofen, naproxen, or ketoprofen unless instructed by your doctor. These medicines may hide a fever. Do not become pregnant while taking this medicine. Women should inform their doctor if they wish to become pregnant or think they might be pregnant. There is a potential for serious side effects to an unborn child. Talk to your health care professional or pharmacist for more information. Do not breast-feed an infant while taking this medicine. Men are advised not to father a child while receiving this medicine. This product may contain alcohol. Ask your pharmacist or healthcare provider if this medicine contains alcohol. Be sure to tell all healthcare providers you are taking this  medicine. Certain medicines, like metronidazole and disulfiram, can cause an unpleasant reaction when taken with alcohol. The reaction includes flushing, headache, nausea, vomiting, sweating, and increased thirst. The reaction can last from 30 minutes to several hours. What side effects may I notice from receiving this medicine? Side effects that you should report to your doctor or health care professional as soon as possible:  allergic reactions like skin rash, itching or hives, swelling of the face, lips, or tongue  breathing problems  changes in vision  fast, irregular heartbeat  high or low blood pressure  mouth sores  pain, tingling, numbness in the hands or feet  signs of decreased platelets or bleeding - bruising, pinpoint red spots on the skin, black, tarry stools, blood in the urine  signs of decreased red blood cells - unusually weak or tired, feeling faint or lightheaded, falls  signs of infection - fever or chills, cough, sore throat, pain or difficulty passing urine  signs and symptoms of liver injury like dark yellow or brown urine; general ill feeling or flu-like symptoms; light-colored stools; loss of appetite; nausea; right upper belly pain; unusually weak or tired; yellowing of the eyes or skin  swelling of the ankles, feet, hands  unusually slow heartbeat Side effects that usually do not require medical attention (report to your doctor or health care professional if they continue or are bothersome):  diarrhea  hair loss  loss of appetite  muscle or joint pain  nausea, vomiting  pain, redness, or irritation at site where injected  tiredness This list may not describe all possible side effects. Call your doctor for medical advice about side effects. You may report side effects to FDA at 1-800-FDA-1088. Where should I keep my medicine? This drug is given in a hospital or clinic and will not be stored at home. NOTE: This sheet is a summary. It may not  cover all possible information. If you have questions about this medicine, talk to your doctor, pharmacist, or health care provider.  2020 [employer]vier/Gold Standard (2017-07-15 13:14:55)  Carboplatin injection What is this medicine? CARBOPLATIN (KAR boe pla tin) is a chemotherapy drug. It targets fast dividing cells, like cancer cells, and causes these cells to die. This medicine is used to treat ovarian cancer and many other cancers. This medicine may be used for other purposes; ask your health care provider or pharmacist if you have questions. COMMON BRAND NAME(S): Paraplatin What should I tell my health care provider before I take this medicine? They need to know if you have any of these conditions:  blood disorders  hearing problems  kidney disease  recent or ongoing radiation therapy  an unusual or allergic reaction to carboplatin, cisplatin, other chemotherapy, other medicines, foods, dyes, or preservatives  pregnant or trying to get pregnant  breast-feeding How should I use this medicine? This drug is usually given as an infusion into a vein. It is administered in a hospital or clinic by a specially trained health care professional. Talk to your pediatrician regarding the use of this medicine in children. Special care may be needed. Overdosage: If you think you have taken too much of this medicine contact a poison control center or emergency room at once. NOTE: This medicine is only for you. Do not share this medicine with others. What if I miss a dose? It is important not to miss a dose. Call your doctor or health care professional if you are unable to keep an appointment. What may interact with this medicine?  medicines for seizures  medicines to increase blood counts like filgrastim, pegfilgrastim, sargramostim  some antibiotics like amikacin, gentamicin, neomycin, streptomycin, tobramycin  vaccines Talk to your doctor or health care professional before taking any of  these medicines:  acetaminophen  aspirin  ibuprofen  ketoprofen  naproxen This list may not describe all possible interactions. Give your health care provider a list of all the medicines, herbs, non-prescription drugs, or dietary supplements you use. Also tell them if you smoke, drink alcohol, or use illegal drugs. Some items may interact with your medicine. What should I watch for while using this medicine? Your condition will be monitored carefully while you are receiving this medicine. You will need important blood work done while you are taking this medicine. This drug may make you feel generally unwell. This is not uncommon, as chemotherapy can affect healthy cells as well as cancer cells. Report any side effects. Continue your course of treatment even though you feel ill unless your doctor tells you to stop. In some cases, you may be given additional medicines to help with side effects. Follow all directions for their use. Call your doctor or health care professional for advice if you get a fever, chills or sore throat, or other symptoms of a cold or flu. Do not treat yourself. This drug decreases your body's ability to fight infections. Try to avoid being around people who are sick. This medicine may increase your  risk to bruise or bleed. Call your doctor or health care professional if you notice any unusual bleeding. Be careful brushing and flossing your teeth or using a toothpick because you may get an infection or bleed more easily. If you have any dental work done, tell your dentist you are receiving this medicine. Avoid taking products that contain aspirin, acetaminophen, ibuprofen, naproxen, or ketoprofen unless instructed by your doctor. These medicines may hide a fever. Do not become pregnant while taking this medicine. Women should inform their doctor if they wish to become pregnant or think they might be pregnant. There is a potential for serious side effects to an unborn child.  Talk to your health care professional or pharmacist for more information. Do not breast-feed an infant while taking this medicine. What side effects may I notice from receiving this medicine? Side effects that you should report to your doctor or health care professional as soon as possible:  allergic reactions like skin rash, itching or hives, swelling of the face, lips, or tongue  signs of infection - fever or chills, cough, sore throat, pain or difficulty passing urine  signs of decreased platelets or bleeding - bruising, pinpoint red spots on the skin, black, tarry stools, nosebleeds  signs of decreased red blood cells - unusually weak or tired, fainting spells, lightheadedness  breathing problems  changes in hearing  changes in vision  chest pain  high blood pressure  low blood counts - This drug may decrease the number of white blood cells, red blood cells and platelets. You may be at increased risk for infections and bleeding.  nausea and vomiting  pain, swelling, redness or irritation at the injection site  pain, tingling, numbness in the hands or feet  problems with balance, talking, walking  trouble passing urine or change in the amount of urine Side effects that usually do not require medical attention (report to your doctor or health care professional if they continue or are bothersome):  hair loss  loss of appetite  metallic taste in the mouth or changes in taste This list may not describe all possible side effects. Call your doctor for medical advice about side effects. You may report side effects to FDA at 1-800-FDA-1088. Where should I keep my medicine? This drug is given in a hospital or clinic and will not be stored at home. NOTE: This sheet is a summary. It may not cover all possible information. If you have questions about this medicine, talk to your doctor, pharmacist, or health care provider.  2020 [employer]vier/Gold Standard (2008-02-16 14:38:05)

## 2020-02-01 NOTE — Telephone Encounter (Signed)
Scheduled appts per los. Called and left msg of changes to schedule. Mailed printout

## 2020-02-01 NOTE — Progress Notes (Signed)
Patient is requesting her lab appointments be scheduled the day before her chemo treatments.  I have sent scheduling a message to change her existing appointments to reflect this and coordinate with her [facility] preferably before she goes to [facility].

## 2020-02-01 NOTE — Progress Notes (Signed)
Per Dr. Benay Spice, ok to treat with HR 103.

## 2020-02-02 ENCOUNTER — Telehealth: Payer: Self-pay | Admitting: Oncology

## 2020-02-02 ENCOUNTER — Other Ambulatory Visit: Payer: Self-pay | Admitting: Internal Medicine

## 2020-02-02 ENCOUNTER — Telehealth: Payer: Self-pay | Admitting: *Deleted

## 2020-02-02 ENCOUNTER — Other Ambulatory Visit: Payer: Self-pay

## 2020-02-02 ENCOUNTER — Encounter: Payer: Self-pay | Admitting: Genetic Counselor

## 2020-02-02 ENCOUNTER — Inpatient Hospital Stay (HOSPITAL_BASED_OUTPATIENT_CLINIC_OR_DEPARTMENT_OTHER): Payer: PPO | Admitting: Genetic Counselor

## 2020-02-02 ENCOUNTER — Ambulatory Visit
Admission: RE | Admit: 2020-02-02 | Discharge: 2020-02-02 | Disposition: A | Discharge status: Home / Self Care | Payer: PPO | Source: Ambulatory Visit | Attending: Radiation Oncology | Admitting: Radiation Oncology

## 2020-02-02 ENCOUNTER — Other Ambulatory Visit: Payer: Self-pay | Admitting: Genetic Counselor

## 2020-02-02 ENCOUNTER — Inpatient Hospital Stay: Payer: PPO

## 2020-02-02 DIAGNOSIS — K635 Polyp of colon: Secondary | ICD-10-CM | POA: Diagnosis not present

## 2020-02-02 DIAGNOSIS — C50511 Malignant neoplasm of lower-outer quadrant of right female breast: Secondary | ICD-10-CM | POA: Diagnosis not present

## 2020-02-02 DIAGNOSIS — D0512 Intraductal carcinoma in situ of left breast: Secondary | ICD-10-CM | POA: Diagnosis not present

## 2020-02-02 DIAGNOSIS — Z170 Estrogen receptor positive status [ER+]: Secondary | ICD-10-CM

## 2020-02-02 DIAGNOSIS — Z800 Family history of malignant neoplasm of digestive organs: Secondary | ICD-10-CM | POA: Diagnosis not present

## 2020-02-02 DIAGNOSIS — C158 Malignant neoplasm of overlapping sites of esophagus: Secondary | ICD-10-CM | POA: Diagnosis not present

## 2020-02-02 DIAGNOSIS — Z853 Personal history of malignant neoplasm of breast: Secondary | ICD-10-CM | POA: Diagnosis not present

## 2020-02-02 DIAGNOSIS — Z510 Encounter for antineoplastic radiation therapy: Secondary | ICD-10-CM | POA: Diagnosis not present

## 2020-02-02 LAB — GENETIC SCREENING ORDER

## 2020-02-02 NOTE — Progress Notes (Signed)
REFERRING PROVIDER: Ladell Pier, MD [address] [place],  [place] [address]  PRIMARY PROVIDER:  Hoyt Koch, MD  PRIMARY REASON FOR VISIT:  1. Ductal carcinoma in situ (DCIS) of left breast   2. Family history of colon cancer   3. Polyp of colon, unspecified part of colon, unspecified type   4. Malignant neoplasm of lower-outer quadrant of right breast of female, estrogen receptor positive ([place])      HISTORY OF PRESENT ILLNESS:  I connected with  Ms. Rademaker on 02/02/2020 at 9 AM EDT by MyChart video conference and verified that I am speaking with the correct person using two identifiers.   Patient location: Home Provider location: [facility]  Ms. Spilde, a 74 y.o. female, was seen for a [place] cancer genetics consultation at the request of Dr. Benay Spice due to a personal and family history of cancer.  Ms. Loud presents to clinic today to discuss the possibility of a hereditary predisposition to cancer, genetic testing, and to further clarify her future cancer risks, as well as potential cancer risks for family members.   In 1996, at the age of 63, Ms. Reamer was diagnosed with invasive ductal carcinoma of the right breast. The treatment plan included chemotherapy, lumpectomy and radiation.  In 2011, at the age of 38, Ms. Engelken was diagnosed with DCIS of the left breast.  This was treated with lumpectomy and radiation.  More recently, MS. Armand was diagnosed with squamous cell carcinoma of the esophagus.      CANCER HISTORY:  Oncology History  Breast cancer of lower-outer quadrant of right female breast ([place])  05/29/1995 Surgery   The right breast invasive ductal carcinoma [name] [name] status post right lumpectomy with axillary lymph node dissection and chemotherapy with 4 cycles of AC, adjuvant radiation and 5 years tamoxifen   08/07/1995 - 10/07/1995 Chemotherapy   Cytoxan based chemo X 3 months   11/06/1995 - 12/19/1995 Radiation Therapy   Adjuvant XRT   07/06/1996 - 09/05/2000 Anti-estrogen oral therapy   Tamoxifen 20 mg daily x 5 years   12/13/2009 Relapse/Recurrence   Left breast lumpectomy revealed DCIS solid and cribriform types with comedonecrosis 1.1 cm [name], [name]    01/22/2010 - 02/26/2010 Radiation Therapy   Adjuvant XRT   04/05/2010 - 11/06/2015 Anti-estrogen oral therapy   Tamoxifen 20 mg daily   Primary squamous cell carcinoma of overlapping sites of esophagus ([place])  01/19/2020 Initial Diagnosis   Primary squamous cell carcinoma of overlapping sites of esophagus ([place])   01/25/2020 Cancer Staging   Staging form: Esophagus - Squamous Cell Carcinoma, AJCC 8th Edition - Clinical stage from 01/25/2020: Stage Unknown (cTX, cN2, cM0, G2, L: Upper) - Signed by Grace Isaac, MD on 01/25/2020   02/01/2020 -  Chemotherapy   The patient had palonosetron (ALOXI) injection 0.25 mg, 0.25 mg, Intravenous,  Once, 1 of 1 cycle Administration: 0.25 mg (02/01/2020) CARBOplatin (PARAPLATIN) 130 mg in sodium chloride 0.9 % 100 mL chemo infusion, 130 mg (100 % of original dose 134.6 mg), Intravenous,  Once, 1 of 1 cycle Dose modification:   (original dose 134.6 mg, Cycle 1) Administration: 130 mg (02/01/2020) PACLitaxel (TAXOL) 78 mg in sodium chloride 0.9 % 250 mL chemo infusion (</= 57m/m2), 50 mg/m2 = 78 mg, Intravenous,  Once, 1 of 1 cycle Administration: 78 mg (02/01/2020)  for chemotherapy treatment.       RISK FACTORS:  Menarche was at age 74  First live birth at  age N/A.  Ovaries intact: yes.  Hysterectomy: no.  Menopausal status: postmenopausal.  HRT use: 0 years. Colonoscopy: yes; normal. Mammogram within the last year: yes. Number of breast biopsies: 0. Up to date with pelvic exams: yes. Any excessive radiation exposure in the past: no  Past Medical History:  Diagnosis Date  . Allergy   . Anxiety   . Asthma   . Breast cancer ([place]) 2012   left  . Breast cancer, right ([place]) 1996   right  . Cataract     cataracts removed  . Chronic uveitis of right eye   . Colon polyps   . Corneal edema of right eye   . Cystoid macular edema of right eye   . Depressive disorder   . DVT (deep venous thrombosis) ([place])   . Family history of colon cancer   . Glaucoma    not on eye drops at this time.  . Hypertension   . Hypertensive retinopathy of both eyes   . Olecranon bursitis   . Osteoarthritis   . Other disorders of bone and cartilage(733.99)   . Pseudoexfoliation (PXF) glaucoma of both eyes   . Pseudophakia, both eyes   . Seasonal allergies   . Unspecified pruritic disorder     Past Surgical History:  Procedure Laterality Date  . APPENDECTOMY  1958  . BREAST LUMPECTOMY Right 1996   Dr. Corena Herter in [place]  . BREAST LUMPECTOMY Left 2011   Dr. Pleas Patricia  . cataract Bilateral   . CATARACT EXTRACTION Bilateral    Dr. Lucita Ferrara  . COLONOSCOPY  2011-last  . DUPUYTREN CONTRACTURE RELEASE    . HAND SURGERY Right 2020  . LASIK Bilateral 2007  . POLYPECTOMY    . ROTATOR CUFF REPAIR  2007  . TONSILLECTOMY  1967    Social History   Socioeconomic History  . Marital status: Married    Spouse name: Not on file  . Number of children: 0  . Years of education: Not on file  . Highest education level: Not on file  Occupational History  . Not on file  Tobacco Use  . Smoking status: Never Smoker  . Smokeless tobacco: Never Used  Substance and Sexual Activity  . Alcohol use: Yes    Alcohol/week: 14.0 standard drinks    Types: 14 Glasses of wine per week  . Drug use: No  . Sexual activity: Yes  Other Topics Concern  . Not on file  Social History Narrative   Married no children   Wine 3 to 4 glasses a day   Never smoker   No drug use   Social Determinants of Radio broadcast assistant Strain:   . Difficulty of Paying Living Expenses: Not on file  Food Insecurity:   . Worried About Charity fundraiser in the Last Year: Not on file  . Ran Out of Food in the Last Year: Not on file   Transportation Needs:   . Lack of Transportation (Medical): Not on file  . Lack of Transportation (Non-Medical): Not on file  Physical Activity:   . Days of Exercise per Week: Not on file  . Minutes of Exercise per Session: Not on file  Stress:   . Feeling of Stress : Not on file  Social Connections:   . Frequency of Communication with Friends and Family: Not on file  . Frequency of Social Gatherings with Friends and Family: Not on file  . Attends Religious Services: Not on file  .  Active Member of Clubs or Organizations: Not on file  . Attends Archivist Meetings: Not on file  . Marital Status: Not on file     FAMILY HISTORY:  We obtained a detailed, 4-generation family history.  Significant diagnoses are listed below: Family History  Problem Relation Age of Onset  . Stroke Father 23  . Heart disease Brother   . Colon cancer Maternal Aunt 55  . Macular degeneration Mother   . Cancer Maternal Grandmother        unknown type  . Colon polyps Neg Hx   . Esophageal cancer Neg Hx   . Rectal cancer Neg Hx   . Stomach cancer Neg Hx     The patient does not have children.  She has a brother and sister who are cancer free.  Both parents are deceased.  The patient's father died of a stroke at 2.  His family history is unknown   The patient's mother died from old age.  She had a brother and sister,  The sister had colon cancer.  Her parents are deceased.  The grandmother had some form of cancer.  Ms. Benda is unaware of previous family history of genetic testing for hereditary cancer risks. Patient's maternal ancestors are of [place] descent, and paternal ancestors are of [place] descent. There is no reported Ashkenazi Jewish ancestry. There is no known consanguinity.    GENETIC COUNSELING ASSESSMENT: Ms. Rumore is a 74 y.o. female with a personal and family history of cancer which is somewhat suggestive of a hereditary cancer syndrome and predisposition to cancer given her  bilateral breast cancer with her first cancer under age 19 and the limited family history on the paternal side of the family. We, therefore, discussed and recommended the following at today's visit.   DISCUSSION: We discussed that 5 - 10% of breast cancer is hereditary, with most cases associated with BRCA mutations.  There are other genes that can be associated with hereditary breast cancer syndromes.  These include ATM, CHEK2 or PALB2.  We discussed that testing is beneficial for several reasons including knowing how to follow individuals after completing their treatment, identifying whether potential treatment options such as PARP inhibitors would be beneficial, and understand if other family members could be at risk for cancer and allow them to undergo genetic testing.   We reviewed the characteristics, features and inheritance patterns of hereditary cancer syndromes. We also discussed genetic testing, including the appropriate family members to test, the process of testing, insurance coverage and turn-around-time for results. We discussed the implications of a negative, positive, carrier and/or variant of uncertain significant result. We recommended Ms. Hazard pursue genetic testing for the common hereditary cancer gene panel. The Common Hereditary Gene Panel offered by [facility] includes sequencing and/or deletion duplication testing of the following 48 genes: APC, ATM, AXIN2, BARD1, BMPR1A, BRCA1, BRCA2, BRIP1, CDH1, CDK4, CDKN2A (p14ARF), CDKN2A (p16INK4a), CHEK2, CTNNA1, DICER1, EPCAM (Deletion/duplication testing only), GREM1 (promoter region deletion/duplication testing only), KIT, MEN1, MLH1, MSH2, MSH3, MSH6, MUTYH, NBN, NF1, NHTL1, PALB2, PDGFRA, PMS2, POLD1, POLE, PTEN, RAD50, RAD51C, RAD51D, RNF43, SDHB, SDHC, SDHD, SMAD4, SMARCA4. STK11, TP53, TSC1, TSC2, and VHL.  The following genes were evaluated for sequence changes only: SDHA and HOXB13 c.251G>A variant only.   Based on Ms. Rihn's personal  and family history of cancer, she meets medical criteria for genetic testing. Despite that she meets criteria, she may still have an out of pocket cost. We discussed that if her out of pocket  cost for testing is over $100, the laboratory will call and confirm whether she wants to proceed with testing.  If the out of pocket cost of testing is less than $100 she will be billed by the genetic testing laboratory.   PLAN: After considering the risks, benefits, and limitations, Ms. Morro provided informed consent to pursue genetic testing and the blood sample was sent to [facility] for analysis of the common hereditary cancer panel. Results should be available within approximately 2-3 weeks' time, at which point they will be disclosed by telephone to Ms. Feazell, as will any additional recommendations warranted by these results. Ms. Pe will receive a summary of her genetic counseling visit and a copy of her results once available. This information will also be available in Epic.   Lastly, we encouraged Ms. Kidney to remain in contact with cancer genetics annually so that we can continuously update the family history and inform her of any changes in cancer genetics and testing that may be of benefit for this family.   Ms. Transue questions were answered to her satisfaction today. Our contact information was provided should additional questions or concerns arise. Thank you for the referral and allowing [place] to share in the care of your patient.   Jazzmyn P. Florene Glen, [place], [facility] Licensed, Insurance risk surveyor [email or web address] phone: [phone]  The patient was seen for a total of 30 minutes in face-to-face genetic counseling.  This patient was discussed with Drs. Magrinat, Lindi Adie and/or Burr Medico who agrees with the above.    _______________________________________________________________________ For Office Staff:  Number of people involved in session: 1 Was an Intern/ student involved  with case: no

## 2020-02-02 NOTE — Telephone Encounter (Signed)
R/s appt per 3/9 sch message - pt aware of appt date and time

## 2020-02-03 ENCOUNTER — Ambulatory Visit
Admission: RE | Admit: 2020-02-03 | Discharge: 2020-02-03 | Disposition: A | Discharge status: Home / Self Care | Payer: PPO | Source: Ambulatory Visit | Attending: Radiation Oncology | Admitting: Radiation Oncology

## 2020-02-03 ENCOUNTER — Other Ambulatory Visit: Payer: Self-pay

## 2020-02-03 DIAGNOSIS — C158 Malignant neoplasm of overlapping sites of esophagus: Secondary | ICD-10-CM | POA: Diagnosis not present

## 2020-02-03 DIAGNOSIS — Z510 Encounter for antineoplastic radiation therapy: Secondary | ICD-10-CM | POA: Diagnosis not present

## 2020-02-04 ENCOUNTER — Other Ambulatory Visit: Payer: Self-pay

## 2020-02-04 ENCOUNTER — Ambulatory Visit
Admission: RE | Admit: 2020-02-04 | Discharge: 2020-02-04 | Disposition: A | Discharge status: Home / Self Care | Payer: PPO | Source: Ambulatory Visit | Attending: Radiation Oncology | Admitting: Radiation Oncology

## 2020-02-04 DIAGNOSIS — Z510 Encounter for antineoplastic radiation therapy: Secondary | ICD-10-CM | POA: Diagnosis not present

## 2020-02-04 DIAGNOSIS — C158 Malignant neoplasm of overlapping sites of esophagus: Secondary | ICD-10-CM | POA: Diagnosis not present

## 2020-02-04 MED ORDER — SONAFINE EX EMUL
1.0000 "application " | Freq: Once | CUTANEOUS | Status: AC
  Administered 2020-02-04: 1 via TOPICAL

## 2020-02-04 NOTE — Progress Notes (Signed)
Pt here for patient teaching.  Pt given Radiation and You booklet, skin care instructions and Sonafine.  Reviewed areas of pertinence such as fatigue, hair loss, skin changes, throat changes, cough and shortness of breath . Pt able to give teach back of to pat skin, use unscented/gentle soap and drink plenty of water,apply Sonafine bid and avoid applying anything to skin within 4 hours of treatment. Pt verbalizes understanding of information given and will contact nursing with any questions or concerns.       Suliman Termini M. Anja Neuzil RN, BSN      

## 2020-02-06 ENCOUNTER — Other Ambulatory Visit: Payer: Self-pay | Admitting: Oncology

## 2020-02-07 ENCOUNTER — Inpatient Hospital Stay (HOSPITAL_BASED_OUTPATIENT_CLINIC_OR_DEPARTMENT_OTHER): Payer: PPO | Admitting: Oncology

## 2020-02-07 ENCOUNTER — Other Ambulatory Visit: Payer: Self-pay

## 2020-02-07 ENCOUNTER — Other Ambulatory Visit (HOSPITAL_COMMUNITY)
Admission: RE | Admit: 2020-02-07 | Discharge: 2020-02-07 | Disposition: A | Discharge status: Home / Self Care | Payer: PPO | Source: Ambulatory Visit | Attending: Gastroenterology | Admitting: Gastroenterology

## 2020-02-07 ENCOUNTER — Ambulatory Visit
Admission: RE | Admit: 2020-02-07 | Discharge: 2020-02-07 | Disposition: A | Discharge status: Home / Self Care | Payer: PPO | Source: Ambulatory Visit | Attending: Radiation Oncology | Admitting: Radiation Oncology

## 2020-02-07 ENCOUNTER — Inpatient Hospital Stay: Payer: PPO

## 2020-02-07 VITALS — BP 128/91 | HR 105 | Temp 98.5°F | Resp 17 | Ht 63.0 in | Wt 114.7 lb

## 2020-02-07 DIAGNOSIS — Z01812 Encounter for preprocedural laboratory examination: Secondary | ICD-10-CM | POA: Insufficient documentation

## 2020-02-07 DIAGNOSIS — C50511 Malignant neoplasm of lower-outer quadrant of right female breast: Secondary | ICD-10-CM | POA: Diagnosis not present

## 2020-02-07 DIAGNOSIS — C159 Malignant neoplasm of esophagus, unspecified: Secondary | ICD-10-CM | POA: Diagnosis not present

## 2020-02-07 DIAGNOSIS — Z510 Encounter for antineoplastic radiation therapy: Secondary | ICD-10-CM | POA: Diagnosis not present

## 2020-02-07 DIAGNOSIS — Z20822 Contact with and (suspected) exposure to covid-19: Secondary | ICD-10-CM | POA: Diagnosis not present

## 2020-02-07 DIAGNOSIS — Z170 Estrogen receptor positive status [ER+]: Secondary | ICD-10-CM | POA: Diagnosis not present

## 2020-02-07 DIAGNOSIS — Z5111 Encounter for antineoplastic chemotherapy: Secondary | ICD-10-CM | POA: Diagnosis not present

## 2020-02-07 DIAGNOSIS — C158 Malignant neoplasm of overlapping sites of esophagus: Secondary | ICD-10-CM | POA: Diagnosis not present

## 2020-02-07 LAB — CMP (CANCER CENTER ONLY)
ALT: 11 U/L (ref 0–44)
AST: 14 U/L — ABNORMAL LOW (ref 15–41)
Albumin: 3.3 g/dL — ABNORMAL LOW (ref 3.5–5.0)
Alkaline Phosphatase: 58 U/L (ref 38–126)
Anion gap: 8 (ref 5–15)
BUN: 21 mg/dL (ref 8–23)
CO2: 35 mmol/L — ABNORMAL HIGH (ref 22–32)
Calcium: 9.2 mg/dL (ref 8.9–10.3)
Chloride: 97 mmol/L — ABNORMAL LOW (ref 98–111)
Creatinine: 0.75 mg/dL (ref 0.44–1.00)
GFR, Est AFR Am: >60 mL/min (ref >60)
GFR, Estimated: >60 mL/min (ref >60)
Glucose, Bld: 128 mg/dL — ABNORMAL HIGH (ref 70–99)
Potassium: 3.4 mmol/L — ABNORMAL LOW (ref 3.5–5.1)
Sodium: 140 mmol/L (ref 135–145)
Total Bilirubin: 0.6 mg/dL (ref 0.3–1.2)
Total Protein: 6.4 g/dL — ABNORMAL LOW (ref 6.5–8.1)

## 2020-02-07 LAB — CBC WITH DIFFERENTIAL (CANCER CENTER ONLY)
Abs Immature Granulocytes: 0.09 10*3/uL — ABNORMAL HIGH (ref 0.00–0.07)
Basophils Absolute: 0 10*3/uL (ref 0.0–0.1)
Basophils Relative: 1 %
Eosinophils Absolute: 0.2 10*3/uL (ref 0.0–0.5)
Eosinophils Relative: 3 %
HCT: 40.3 % (ref 36.0–46.0)
Hemoglobin: 13.4 g/dL (ref 12.0–15.0)
Immature Granulocytes: 2 %
Lymphocytes Relative: 9 %
Lymphs Abs: 0.4 10*3/uL — ABNORMAL LOW (ref 0.7–4.0)
MCH: 32 pg (ref 26.0–34.0)
MCHC: 33.3 g/dL (ref 30.0–36.0)
MCV: 96.2 fL (ref 80.0–100.0)
Monocytes Absolute: 0.3 10*3/uL (ref 0.1–1.0)
Monocytes Relative: 5 %
Neutro Abs: 4.2 10*3/uL (ref 1.7–7.7)
Neutrophils Relative %: 80 %
Platelet Count: 266 10*3/uL (ref 150–400)
RBC: 4.19 MIL/uL (ref 3.87–5.11)
RDW: 12.5 % (ref 11.5–15.5)
WBC Count: 5.2 10*3/uL (ref 4.0–10.5)
nRBC: 0 % (ref 0.0–0.2)

## 2020-02-07 LAB — SARS CORONAVIRUS 2 (TAT 6-24 HRS): SARS Coronavirus 2: NEGATIVE

## 2020-02-07 NOTE — Progress Notes (Signed)
  Jo Morrison   Diagnosis: Esophagus cancer  INTERVAL HISTORY:   Jo Morrison begin weekly Taxol/carboplatin on 02/01/2020.  She started radiation on 01/31/2020.  She continues to have mild dysphagia.  No symptoms of allergic reaction.  She felt very weak and noted a systolic blood pressure of 86 late last week.  The blood pressure has since normalized.  She reports adequate intake of liquids.  Objective:  Vital signs in last 24 hours:  Blood pressure (!) 128/91, pulse (!) 105, temperature 98.5 F (36.9 C), temperature source Temporal, resp. rate 17, height _0  (1.6 m), weight 114 lb 11.2 oz (52 kg), SpO2 100 %. Resp: Distant breath sounds, clear bilaterally Cardio: Regular rate and rhythm GI: No hepatomegaly, no mass Vascular: No leg edema    Lab Results:  Lab Results  Component Value Date   WBC 5.2 02/07/2020   HGB 13.4 02/07/2020   HCT 40.3 02/07/2020   MCV 96.2 02/07/2020   PLT 266 02/07/2020   NEUTROABS 4.2 02/07/2020    CMP  Lab Results  Component Value Date   NA 140 02/07/2020   K 3.4 (L) 02/07/2020   CL 97 (L) 02/07/2020   CO2 35 (H) 02/07/2020   GLUCOSE 128 (H) 02/07/2020   BUN 21 02/07/2020   CREATININE 0.75 02/07/2020   CALCIUM 9.2 02/07/2020   PROT 6.4 (L) 02/07/2020   ALBUMIN 3.3 (L) 02/07/2020   AST 14 (L) 02/07/2020   ALT 11 02/07/2020   ALKPHOS 58 02/07/2020   BILITOT 0.6 02/07/2020   GFRNONAA >60 02/07/2020   GFRAA >60 02/07/2020     Medications: I have reviewed the patient's current medications.   Assessment/Plan: 1. 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 Upper endoscopy 01/14/2020-ulcerated mass in the proximal/mid esophagus at 26-31 cm from the incisors, biopsy confirmed invasive moderately differentiated squamous cell carcinoma ? CT abdomen/pelvis 12/09/2019-uterine fibroids ? CT chest 12/30/2019-normal esophagus, new sclerosis of the medial head of the right clavicle likely  degenerative ? PET scan 01/24/2020-long segment of hypermetabolic thickening of the distal esophagus.  For hypermetabolic metastatic lymph nodes, 3 in the mediastinum and 1 in the upper abdomen gastrohepatic ligament. ? Radiation 01/31/2020 ? Cycle 1 weekly Taxol/carboplatin 02/01/2020 2. Right breast cancer July 1996, [name], [name], right lumpectomy and axillary dissection followed by adjuvant Adriamycin/Taxol chemotherapy-4 cycles, 5 years of tamoxifen, 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, macular edema, and corneal edema  7.  Asthma    Disposition: Jo Morrison tolerated the first cycle of chemotherapy well.  She will check the blood pressure daily and hold HCTZ pressure is low again.  Encouraged her to push fluids and oral nutrition as tolerated.  She will complete a second treatment with Taxol/carboplatin tomorrow.  She will be scheduled for an office visit in 2 weeks.  Jo Morrison is scheduled for a staging EUS later this week.  Betsy Coder, MD  02/07/2020  3:38 PM

## 2020-02-07 NOTE — H&P (View-Only) (Signed)
  [place] OFFICE PROGRESS NOTE   Diagnosis: Esophagus cancer  INTERVAL HISTORY:   [name] begin weekly Taxol/carboplatin on 02/01/2020.  She started radiation on 01/31/2020.  She continues to have mild dysphagia.  No symptoms of allergic reaction.  She felt very weak and noted a systolic blood pressure of 86 late last week.  The blood pressure has since normalized.  She reports adequate intake of liquids.  Objective:  Vital signs in last 24 hours:  Blood pressure (!) 128/91, pulse (!) 105, temperature 98.5 F (36.9 C), temperature source Temporal, resp. rate 17, height _0  (1.6 m), weight 114 lb 11.2 oz (52 kg), SpO2 100 %. Resp: Distant breath sounds, clear bilaterally Cardio: Regular rate and rhythm GI: No hepatomegaly, no mass Vascular: No leg edema    Lab Results:  Lab Results  Component Value Date   WBC 5.2 02/07/2020   HGB 13.4 02/07/2020   HCT 40.3 02/07/2020   MCV 96.2 02/07/2020   PLT 266 02/07/2020   NEUTROABS 4.2 02/07/2020    CMP  Lab Results  Component Value Date   NA 140 02/07/2020   K 3.4 (L) 02/07/2020   CL 97 (L) 02/07/2020   CO2 35 (H) 02/07/2020   GLUCOSE 128 (H) 02/07/2020   BUN 21 02/07/2020   CREATININE 0.75 02/07/2020   CALCIUM 9.2 02/07/2020   PROT 6.4 (L) 02/07/2020   ALBUMIN 3.3 (L) 02/07/2020   AST 14 (L) 02/07/2020   ALT 11 02/07/2020   ALKPHOS 58 02/07/2020   BILITOT 0.6 02/07/2020   GFRNONAA >60 02/07/2020   GFRAA >60 02/07/2020     Medications: I have reviewed the patient's current medications.   Assessment/Plan: 1. 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 Upper endoscopy 01/14/2020-ulcerated mass in the proximal/mid esophagus at 26-31 cm from the incisors, biopsy confirmed invasive moderately differentiated squamous cell carcinoma ? CT abdomen/pelvis 12/09/2019-uterine fibroids ? CT chest 12/30/2019-normal esophagus, new sclerosis of the medial head of the right clavicle likely  degenerative ? PET scan 01/24/2020-long segment of hypermetabolic thickening of the distal esophagus.  For hypermetabolic metastatic lymph nodes, 3 in the mediastinum and 1 in the upper abdomen gastrohepatic ligament. ? Radiation 01/31/2020 ? Cycle 1 weekly Taxol/carboplatin 02/01/2020 2. Right breast cancer July 1996, [name], [name], right lumpectomy and axillary dissection followed by adjuvant Adriamycin/Taxol chemotherapy-4 cycles, 5 years of tamoxifen, 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, macular edema, and corneal edema  7.  Asthma    Disposition: Jo Morrison tolerated the first cycle of chemotherapy well.  She will check the blood pressure daily and hold HCTZ pressure is low again.  Encouraged her to push fluids and oral nutrition as tolerated.  She will complete a second treatment with Taxol/carboplatin tomorrow.  She will be scheduled for an office visit in 2 weeks.  Jo Morrison is scheduled for a staging EUS later this week.  Betsy Coder, MD  02/07/2020  3:38 PM

## 2020-02-08 ENCOUNTER — Inpatient Hospital Stay: Payer: PPO | Admitting: Nutrition

## 2020-02-08 ENCOUNTER — Other Ambulatory Visit: Payer: PPO

## 2020-02-08 ENCOUNTER — Ambulatory Visit
Admission: RE | Admit: 2020-02-08 | Discharge: 2020-02-08 | Disposition: A | Discharge status: Home / Self Care | Payer: PPO | Source: Ambulatory Visit | Attending: Radiation Oncology | Admitting: Radiation Oncology

## 2020-02-08 ENCOUNTER — Inpatient Hospital Stay: Payer: PPO

## 2020-02-08 ENCOUNTER — Other Ambulatory Visit: Payer: Self-pay

## 2020-02-08 VITALS — BP 130/70 | HR 88 | Temp 98.3°F | Resp 18

## 2020-02-08 DIAGNOSIS — C158 Malignant neoplasm of overlapping sites of esophagus: Secondary | ICD-10-CM | POA: Diagnosis not present

## 2020-02-08 DIAGNOSIS — Z510 Encounter for antineoplastic radiation therapy: Secondary | ICD-10-CM | POA: Diagnosis not present

## 2020-02-08 DIAGNOSIS — Z5111 Encounter for antineoplastic chemotherapy: Secondary | ICD-10-CM | POA: Diagnosis not present

## 2020-02-08 MED ORDER — DEXAMETHASONE SODIUM PHOSPHATE 10 MG/ML IJ SOLN
10.0000 mg | Freq: Once | INTRAMUSCULAR | Status: AC
  Administered 2020-02-08: 10 mg via INTRAVENOUS

## 2020-02-08 MED ORDER — SODIUM CHLORIDE 0.9 % IV SOLN: 10.0000 mg | Freq: Once | INTRAVENOUS | Status: DC

## 2020-02-08 MED ORDER — FAMOTIDINE IN NACL 20-0.9 MG/50ML-% IV SOLN
INTRAVENOUS | Status: AC
  Filled 2020-02-08: qty 50

## 2020-02-08 MED ORDER — DIPHENHYDRAMINE HCL 50 MG/ML IJ SOLN
25.0000 mg | Freq: Once | INTRAMUSCULAR | Status: AC
  Administered 2020-02-08: 25 mg via INTRAVENOUS

## 2020-02-08 MED ORDER — PALONOSETRON HCL INJECTION 0.25 MG/5ML
INTRAVENOUS | Status: AC
  Filled 2020-02-08: qty 5

## 2020-02-08 MED ORDER — FAMOTIDINE IN NACL 20-0.9 MG/50ML-% IV SOLN
20.0000 mg | Freq: Once | INTRAVENOUS | Status: AC
  Administered 2020-02-08: 20 mg via INTRAVENOUS

## 2020-02-08 MED ORDER — PALONOSETRON HCL INJECTION 0.25 MG/5ML
0.2500 mg | Freq: Once | INTRAVENOUS | Status: AC
  Administered 2020-02-08: 0.25 mg via INTRAVENOUS

## 2020-02-08 MED ORDER — SODIUM CHLORIDE 0.9 % IV SOLN
134.6000 mg | Freq: Once | INTRAVENOUS | Status: AC
  Administered 2020-02-08: 130 mg via INTRAVENOUS
  Filled 2020-02-08: qty 13

## 2020-02-08 MED ORDER — SODIUM CHLORIDE 0.9 % IV SOLN
50.0000 mg/m2 | Freq: Once | INTRAVENOUS | Status: AC
  Administered 2020-02-08: 78 mg via INTRAVENOUS
  Filled 2020-02-08: qty 13

## 2020-02-08 MED ORDER — DIPHENHYDRAMINE HCL 50 MG/ML IJ SOLN
INTRAMUSCULAR | Status: AC
  Filled 2020-02-08: qty 1

## 2020-02-08 MED ORDER — DEXAMETHASONE SODIUM PHOSPHATE 10 MG/ML IJ SOLN
INTRAMUSCULAR | Status: AC
  Filled 2020-02-08: qty 1

## 2020-02-08 MED ORDER — SODIUM CHLORIDE 0.9 % IV SOLN
Freq: Once | INTRAVENOUS | Status: AC
  Filled 2020-02-08: qty 250

## 2020-02-08 NOTE — Patient Instructions (Signed)
[facility] Discharge Instructions for Patients Receiving Chemotherapy  Today you received the following chemotherapy agents: paclitaxel and carboplatin  To help prevent nausea and vomiting after your treatment, we encourage you to take your nausea medication as prescribed by your physician.    If you develop nausea and vomiting that is not controlled by your nausea medication, call the clinic.   BELOW ARE SYMPTOMS THAT SHOULD BE REPORTED IMMEDIATELY:  *FEVER GREATER THAN 100.5 F  *CHILLS WITH OR WITHOUT FEVER  NAUSEA AND VOMITING THAT IS NOT CONTROLLED WITH YOUR NAUSEA MEDICATION  *UNUSUAL SHORTNESS OF BREATH  *UNUSUAL BRUISING OR BLEEDING  TENDERNESS IN MOUTH AND THROAT WITH OR WITHOUT PRESENCE OF ULCERS  *URINARY PROBLEMS  *BOWEL PROBLEMS  UNUSUAL RASH Items with * indicate a potential emergency and should be followed up as soon as possible.  Feel free to call the clinic should you have any questions or concerns. The clinic phone number is [phone].  Please show the [facility] at check-in to the Emergency Department and triage nurse.  Paclitaxel injection What is this medicine? PACLITAXEL (PAK li TAX el) is a chemotherapy drug. It targets fast dividing cells, like cancer cells, and causes these cells to die. This medicine is used to treat ovarian cancer, breast cancer, lung cancer, Kaposi's sarcoma, and other cancers. This medicine may be used for other purposes; ask your health care provider or pharmacist if you have questions. COMMON BRAND NAME(S): Onxol, Taxol What should I tell my health care provider before I take this medicine? They need to know if you have any of these conditions:  history of irregular heartbeat  liver disease  low blood counts, like low white cell, platelet, or red cell counts  lung or breathing disease, like asthma  tingling of the fingers or toes, or other nerve disorder  an unusual or allergic reaction to  paclitaxel, alcohol, polyoxyethylated castor oil, other chemotherapy, other medicines, foods, dyes, or preservatives  pregnant or trying to get pregnant  breast-feeding How should I use this medicine? This drug is given as an infusion into a vein. It is administered in a hospital or clinic by a specially trained health care professional. Talk to your pediatrician regarding the use of this medicine in children. Special care may be needed. Overdosage: If you think you have taken too much of this medicine contact a poison control center or emergency room at once. NOTE: This medicine is only for you. Do not share this medicine with others. What if I miss a dose? It is important not to miss your dose. Call your doctor or health care professional if you are unable to keep an appointment. What may interact with this medicine? Do not take this medicine with any of the following medications:  disulfiram  metronidazole This medicine may also interact with the following medications:  antiviral medicines for hepatitis, HIV or AIDS  certain antibiotics like erythromycin and clarithromycin  certain medicines for fungal infections like ketoconazole and itraconazole  certain medicines for seizures like carbamazepine, phenobarbital, phenytoin  gemfibrozil  nefazodone  rifampin  St. John's wort This list may not describe all possible interactions. Give your health care provider a list of all the medicines, herbs, non-prescription drugs, or dietary supplements you use. Also tell them if you smoke, drink alcohol, or use illegal drugs. Some items may interact with your medicine. What should I watch for while using this medicine? Your condition will be monitored carefully while you are receiving this medicine.  You will need important blood work done while you are taking this medicine. This medicine can cause serious allergic reactions. To reduce your risk you will need to take other medicine(s)  before treatment with this medicine. If you experience allergic reactions like skin rash, itching or hives, swelling of the face, lips, or tongue, tell your doctor or health care professional right away. In some cases, you may be given additional medicines to help with side effects. Follow all directions for their use. This drug may make you feel generally unwell. This is not uncommon, as chemotherapy can affect healthy cells as well as cancer cells. Report any side effects. Continue your course of treatment even though you feel ill unless your doctor tells you to stop. Call your doctor or health care professional for advice if you get a fever, chills or sore throat, or other symptoms of a cold or flu. Do not treat yourself. This drug decreases your body's ability to fight infections. Try to avoid being around people who are sick. This medicine may increase your risk to bruise or bleed. Call your doctor or health care professional if you notice any unusual bleeding. Be careful brushing and flossing your teeth or using a toothpick because you may get an infection or bleed more easily. If you have any dental work done, tell your dentist you are receiving this medicine. Avoid taking products that contain aspirin, acetaminophen, ibuprofen, naproxen, or ketoprofen unless instructed by your doctor. These medicines may hide a fever. Do not become pregnant while taking this medicine. Women should inform their doctor if they wish to become pregnant or think they might be pregnant. There is a potential for serious side effects to an unborn child. Talk to your health care professional or pharmacist for more information. Do not breast-feed an infant while taking this medicine. Men are advised not to father a child while receiving this medicine. This product may contain alcohol. Ask your pharmacist or healthcare provider if this medicine contains alcohol. Be sure to tell all healthcare providers you are taking this  medicine. Certain medicines, like metronidazole and disulfiram, can cause an unpleasant reaction when taken with alcohol. The reaction includes flushing, headache, nausea, vomiting, sweating, and increased thirst. The reaction can last from 30 minutes to several hours. What side effects may I notice from receiving this medicine? Side effects that you should report to your doctor or health care professional as soon as possible:  allergic reactions like skin rash, itching or hives, swelling of the face, lips, or tongue  breathing problems  changes in vision  fast, irregular heartbeat  high or low blood pressure  mouth sores  pain, tingling, numbness in the hands or feet  signs of decreased platelets or bleeding - bruising, pinpoint red spots on the skin, black, tarry stools, blood in the urine  signs of decreased red blood cells - unusually weak or tired, feeling faint or lightheaded, falls  signs of infection - fever or chills, cough, sore throat, pain or difficulty passing urine  signs and symptoms of liver injury like dark yellow or brown urine; general ill feeling or flu-like symptoms; light-colored stools; loss of appetite; nausea; right upper belly pain; unusually weak or tired; yellowing of the eyes or skin  swelling of the ankles, feet, hands  unusually slow heartbeat Side effects that usually do not require medical attention (report to your doctor or health care professional if they continue or are bothersome):  diarrhea  hair loss  loss of appetite  muscle or joint pain  nausea, vomiting  pain, redness, or irritation at site where injected  tiredness This list may not describe all possible side effects. Call your doctor for medical advice about side effects. You may report side effects to FDA at 1-800-FDA-1088. Where should I keep my medicine? This drug is given in a hospital or clinic and will not be stored at home. NOTE: This sheet is a summary. It may not  cover all possible information. If you have questions about this medicine, talk to your doctor, pharmacist, or health care provider.  2020 Elsevier/Gold Standard (2017-07-15 13:14:55)  Carboplatin injection What is this medicine? CARBOPLATIN (KAR boe pla tin) is a chemotherapy drug. It targets fast dividing cells, like cancer cells, and causes these cells to die. This medicine is used to treat ovarian cancer and many other cancers. This medicine may be used for other purposes; ask your health care provider or pharmacist if you have questions. COMMON BRAND NAME(S): Paraplatin What should I tell my health care provider before I take this medicine? They need to know if you have any of these conditions:  blood disorders  hearing problems  kidney disease  recent or ongoing radiation therapy  an unusual or allergic reaction to carboplatin, cisplatin, other chemotherapy, other medicines, foods, dyes, or preservatives  pregnant or trying to get pregnant  breast-feeding How should I use this medicine? This drug is usually given as an infusion into a vein. It is administered in a hospital or clinic by a specially trained health care professional. Talk to your pediatrician regarding the use of this medicine in children. Special care may be needed. Overdosage: If you think you have taken too much of this medicine contact a poison control center or emergency room at once. NOTE: This medicine is only for you. Do not share this medicine with others. What if I miss a dose? It is important not to miss a dose. Call your doctor or health care professional if you are unable to keep an appointment. What may interact with this medicine?  medicines for seizures  medicines to increase blood counts like filgrastim, pegfilgrastim, sargramostim  some antibiotics like amikacin, gentamicin, neomycin, streptomycin, tobramycin  vaccines Talk to your doctor or health care professional before taking any of  these medicines:  acetaminophen  aspirin  ibuprofen  ketoprofen  naproxen This list may not describe all possible interactions. Give your health care provider a list of all the medicines, herbs, non-prescription drugs, or dietary supplements you use. Also tell them if you smoke, drink alcohol, or use illegal drugs. Some items may interact with your medicine. What should I watch for while using this medicine? Your condition will be monitored carefully while you are receiving this medicine. You will need important blood work done while you are taking this medicine. This drug may make you feel generally unwell. This is not uncommon, as chemotherapy can affect healthy cells as well as cancer cells. Report any side effects. Continue your course of treatment even though you feel ill unless your doctor tells you to stop. In some cases, you may be given additional medicines to help with side effects. Follow all directions for their use. Call your doctor or health care professional for advice if you get a fever, chills or sore throat, or other symptoms of a cold or flu. Do not treat yourself. This drug decreases your body's ability to fight infections. Try to avoid being around people who are sick. This medicine may increase your  risk to bruise or bleed. Call your doctor or health care professional if you notice any unusual bleeding. Be careful brushing and flossing your teeth or using a toothpick because you may get an infection or bleed more easily. If you have any dental work done, tell your dentist you are receiving this medicine. Avoid taking products that contain aspirin, acetaminophen, ibuprofen, naproxen, or ketoprofen unless instructed by your doctor. These medicines may hide a fever. Do not become pregnant while taking this medicine. Women should inform their doctor if they wish to become pregnant or think they might be pregnant. There is a potential for serious side effects to an unborn child.  Talk to your health care professional or pharmacist for more information. Do not breast-feed an infant while taking this medicine. What side effects may I notice from receiving this medicine? Side effects that you should report to your doctor or health care professional as soon as possible:  allergic reactions like skin rash, itching or hives, swelling of the face, lips, or tongue  signs of infection - fever or chills, cough, sore throat, pain or difficulty passing urine  signs of decreased platelets or bleeding - bruising, pinpoint red spots on the skin, black, tarry stools, nosebleeds  signs of decreased red blood cells - unusually weak or tired, fainting spells, lightheadedness  breathing problems  changes in hearing  changes in vision  chest pain  high blood pressure  low blood counts - This drug may decrease the number of white blood cells, red blood cells and platelets. You may be at increased risk for infections and bleeding.  nausea and vomiting  pain, swelling, redness or irritation at the injection site  pain, tingling, numbness in the hands or feet  problems with balance, talking, walking  trouble passing urine or change in the amount of urine Side effects that usually do not require medical attention (report to your doctor or health care professional if they continue or are bothersome):  hair loss  loss of appetite  metallic taste in the mouth or changes in taste This list may not describe all possible side effects. Call your doctor for medical advice about side effects. You may report side effects to FDA at 1-800-FDA-1088. Where should I keep my medicine? This drug is given in a hospital or clinic and will not be stored at home. NOTE: This sheet is a summary. It may not cover all possible information. If you have questions about this medicine, talk to your doctor, pharmacist, or health care provider.  2020 Elsevier/Gold Standard (2008-02-16 14:38:05)

## 2020-02-08 NOTE — Progress Notes (Signed)
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  She is a patient of Dr. Benay Spice.  She will be receiving concurrent carbotaxol and radiation treatments.  Past medical history includes DCIS, DVT, hypertension, breast cancer, and depression.  Medications include Klonopin, Zofran, Compazine, and Effexor.  Labs include potassium 3.4, glucose 128, and albumin 3.3.  Height: 5 feet 3 inches. Weight: 114.7 pounds on March 15. Usual body weight: 129 pounds in September 2020. BMI: 20.32.  Patient reports decreased oral intake related to dysphagia.  She states she was drinking some liquids but had not tried any solid foods. She thinks she can increase some very soft moist foods. She states she will start drinking 1 Premier protein daily. Denies nausea, vomiting, constipation, and diarrhea.  Nutrition diagnosis: Unintended weight loss related to dysphagia as evidenced by 14 pound weight loss over 6 months.  This is an 11% loss over 6 months which is significant.  Intervention: Patient educated on soft high-calorie high-protein foods to increase overall calorie level. Patient continue Premier protein once daily.  Educated her to add additional calories in the form of fruit or powdered milk. Brief discussion of pured foods. Fact sheets given and contact information provided.  Questions were answered and teach back method used.  Monitoring, evaluation, goals: Patient will tolerate increased calories and protein to minimize further weight loss.  Next visit: Tuesday, March 30 during infusion.  **Disclaimer: This note was dictated with voice recognition software. Similar sounding words can inadvertently be transcribed and this note may contain transcription errors which may not have been corrected upon publication of note.**

## 2020-02-09 ENCOUNTER — Telehealth: Payer: Self-pay | Admitting: Oncology

## 2020-02-09 ENCOUNTER — Other Ambulatory Visit: Payer: Self-pay

## 2020-02-09 ENCOUNTER — Ambulatory Visit
Admission: RE | Admit: 2020-02-09 | Discharge: 2020-02-09 | Disposition: A | Discharge status: Home / Self Care | Payer: PPO | Source: Ambulatory Visit | Attending: Radiation Oncology | Admitting: Radiation Oncology

## 2020-02-09 DIAGNOSIS — Z510 Encounter for antineoplastic radiation therapy: Secondary | ICD-10-CM | POA: Diagnosis not present

## 2020-02-09 DIAGNOSIS — C158 Malignant neoplasm of overlapping sites of esophagus: Secondary | ICD-10-CM | POA: Diagnosis not present

## 2020-02-09 NOTE — Telephone Encounter (Signed)
Scheduled per los. Called and spoke with patient. Confirmed appt 

## 2020-02-10 ENCOUNTER — Other Ambulatory Visit: Payer: Self-pay

## 2020-02-10 ENCOUNTER — Encounter (HOSPITAL_COMMUNITY): Payer: Self-pay | Admitting: Gastroenterology

## 2020-02-10 ENCOUNTER — Ambulatory Visit (HOSPITAL_COMMUNITY)
Admission: RE | Admit: 2020-02-10 | Discharge: 2020-02-10 | Disposition: A | Discharge status: Home / Self Care | Payer: PPO | Attending: Gastroenterology | Admitting: Gastroenterology

## 2020-02-10 ENCOUNTER — Ambulatory Visit (HOSPITAL_COMMUNITY): Payer: PPO | Admitting: Anesthesiology

## 2020-02-10 ENCOUNTER — Encounter (HOSPITAL_COMMUNITY)
Admission: RE | Disposition: A | Discharge status: Home / Self Care | Payer: Self-pay | Source: Home / Self Care | Attending: Gastroenterology

## 2020-02-10 ENCOUNTER — Ambulatory Visit
Admission: RE | Admit: 2020-02-10 | Discharge: 2020-02-10 | Disposition: A | Discharge status: Home / Self Care | Payer: PPO | Source: Ambulatory Visit | Attending: Radiation Oncology | Admitting: Radiation Oncology

## 2020-02-10 DIAGNOSIS — H1820 Unspecified corneal edema: Secondary | ICD-10-CM | POA: Diagnosis not present

## 2020-02-10 DIAGNOSIS — F329 Major depressive disorder, single episode, unspecified: Secondary | ICD-10-CM | POA: Diagnosis not present

## 2020-02-10 DIAGNOSIS — I899 Noninfective disorder of lymphatic vessels and lymph nodes, unspecified: Secondary | ICD-10-CM | POA: Diagnosis not present

## 2020-02-10 DIAGNOSIS — C159 Malignant neoplasm of esophagus, unspecified: Secondary | ICD-10-CM

## 2020-02-10 DIAGNOSIS — Z86718 Personal history of other venous thrombosis and embolism: Secondary | ICD-10-CM | POA: Diagnosis not present

## 2020-02-10 DIAGNOSIS — C772 Secondary and unspecified malignant neoplasm of intra-abdominal lymph nodes: Secondary | ICD-10-CM | POA: Diagnosis not present

## 2020-02-10 DIAGNOSIS — Z9221 Personal history of antineoplastic chemotherapy: Secondary | ICD-10-CM | POA: Insufficient documentation

## 2020-02-10 DIAGNOSIS — Z853 Personal history of malignant neoplasm of breast: Secondary | ICD-10-CM | POA: Insufficient documentation

## 2020-02-10 DIAGNOSIS — Z923 Personal history of irradiation: Secondary | ICD-10-CM | POA: Insufficient documentation

## 2020-02-10 DIAGNOSIS — R591 Generalized enlarged lymph nodes: Secondary | ICD-10-CM | POA: Insufficient documentation

## 2020-02-10 DIAGNOSIS — K209 Esophagitis, unspecified without bleeding: Secondary | ICD-10-CM | POA: Diagnosis not present

## 2020-02-10 DIAGNOSIS — H209 Unspecified iridocyclitis: Secondary | ICD-10-CM | POA: Insufficient documentation

## 2020-02-10 DIAGNOSIS — I10 Essential (primary) hypertension: Secondary | ICD-10-CM | POA: Diagnosis not present

## 2020-02-10 DIAGNOSIS — M1990 Unspecified osteoarthritis, unspecified site: Secondary | ICD-10-CM | POA: Insufficient documentation

## 2020-02-10 DIAGNOSIS — C154 Malignant neoplasm of middle third of esophagus: Secondary | ICD-10-CM | POA: Diagnosis not present

## 2020-02-10 DIAGNOSIS — R1310 Dysphagia, unspecified: Secondary | ICD-10-CM | POA: Diagnosis not present

## 2020-02-10 DIAGNOSIS — J45909 Unspecified asthma, uncomplicated: Secondary | ICD-10-CM | POA: Diagnosis not present

## 2020-02-10 DIAGNOSIS — Z510 Encounter for antineoplastic radiation therapy: Secondary | ICD-10-CM | POA: Diagnosis not present

## 2020-02-10 DIAGNOSIS — C158 Malignant neoplasm of overlapping sites of esophagus: Secondary | ICD-10-CM | POA: Diagnosis not present

## 2020-02-10 HISTORY — PX: FINE NEEDLE ASPIRATION: SHX5430

## 2020-02-10 HISTORY — PX: UPPER ESOPHAGEAL ENDOSCOPIC ULTRASOUND (EUS): SHX6562

## 2020-02-10 HISTORY — PX: ESOPHAGOGASTRODUODENOSCOPY (EGD) WITH PROPOFOL: SHX5813

## 2020-02-10 SURGERY — UPPER ESOPHAGEAL ENDOSCOPIC ULTRASOUND (EUS)
Anesthesia: Monitor Anesthesia Care

## 2020-02-10 MED ORDER — PROPOFOL 500 MG/50ML IV EMUL
INTRAVENOUS | Status: AC
  Filled 2020-02-10: qty 50

## 2020-02-10 MED ORDER — LACTATED RINGERS IV SOLN
INTRAVENOUS | Status: DC
  Administered 2020-02-10: 1000 mL via INTRAVENOUS

## 2020-02-10 MED ORDER — PROPOFOL 500 MG/50ML IV EMUL
INTRAVENOUS | Status: DC | PRN
  Administered 2020-02-10: 150 ug/kg/min via INTRAVENOUS

## 2020-02-10 MED ORDER — LIDOCAINE 2% (20 MG/ML) 5 ML SYRINGE
INTRAMUSCULAR | Status: DC | PRN
  Administered 2020-02-10: 80 mg via INTRAVENOUS

## 2020-02-10 MED ORDER — PROPOFOL 10 MG/ML IV BOLUS
INTRAVENOUS | Status: DC | PRN
  Administered 2020-02-10: 30 mg via INTRAVENOUS
  Administered 2020-02-10: 50 mg via INTRAVENOUS
  Administered 2020-02-10: 20 mg via INTRAVENOUS

## 2020-02-10 MED ORDER — SODIUM CHLORIDE 0.9 % IV SOLN: INTRAVENOUS | Status: DC

## 2020-02-10 SURGICAL SUPPLY — 14 items

## 2020-02-10 NOTE — Interval H&P Note (Signed)
History and Physical Interval Note:  02/10/2020 12:34 PM  Jo Morrison  has presented today for surgery, with the diagnosis of esophageal cancer staging.  The various methods of treatment have been discussed with the patient and family. After consideration of risks, benefits and other options for treatment, the patient has consented to  Procedure(s): UPPER ESOPHAGEAL ENDOSCOPIC ULTRASOUND (EUS) (N/A) ESOPHAGOGASTRODUODENOSCOPY (EGD) WITH PROPOFOL (N/A) as a surgical intervention.  The patient's history has been reviewed, patient examined, no change in status, stable for surgery.  I have reviewed the patient's chart and labs.  Questions were answered to the patient's satisfaction.     Milus Banister

## 2020-02-10 NOTE — Anesthesia Postprocedure Evaluation (Signed)
Anesthesia Post Note  Patient: Jo Morrison  Procedure(s) Performed: UPPER ESOPHAGEAL ENDOSCOPIC ULTRASOUND (EUS) (N/A ) ESOPHAGOGASTRODUODENOSCOPY (EGD) WITH PROPOFOL (N/A ) FINE NEEDLE ASPIRATION (FNA) LINEAR (N/A )     Patient location during evaluation: Endoscopy Anesthesia Type: MAC Level of consciousness: awake and alert Pain management: pain level controlled Vital Signs Assessment: post-procedure vital signs reviewed and stable Respiratory status: spontaneous breathing, nonlabored ventilation, respiratory function stable and patient connected to nasal cannula oxygen Cardiovascular status: stable and blood pressure returned to baseline Postop Assessment: no apparent nausea or vomiting Anesthetic complications: no    Last Vitals:  Vitals:   02/10/20 1450 02/10/20 1500  BP: (!) 156/73 (!) 159/82  Pulse: 91 88  Resp: 13 10  Temp:    SpO2: 97% 100%    Last Pain:  Vitals:   02/10/20 1500  TempSrc:   PainSc: 0-No pain                 Dabid God[place] P Allard Lightsey

## 2020-02-10 NOTE — Discharge Instructions (Signed)
YOU HAD AN ENDOSCOPIC PROCEDURE TODAY: Refer to the procedure report and other information in the discharge instructions given to you for any specific questions about what was found during the examination. If this information does not answer your questions, please call [facility] office at [phone] to clarify.   YOU SHOULD EXPECT: Some feelings of bloating in the abdomen. Passage of more gas than usual. Walking can help get rid of the air that was put into your GI tract during the procedure and reduce the bloating. If you had a lower endoscopy (such as a colonoscopy or flexible sigmoidoscopy) you may notice spotting of blood in your stool or on the toilet paper. Some abdominal soreness may be present for a day or two, also.  DIET: Your first meal following the procedure should be a light meal and then it is ok to progress to your normal diet. A half-sandwich or bowl of soup is an example of a good first meal. Heavy or fried foods are harder to digest and may make you feel nauseous or bloated. Drink plenty of fluids but you should avoid alcoholic beverages for 24 hours. If you had a esophageal dilation, please see attached instructions for diet.    ACTIVITY: Your care partner should take you home directly after the procedure. You should plan to take it easy, moving slowly for the rest of the day. You can resume normal activity the day after the procedure however YOU SHOULD NOT DRIVE, use power tools, machinery or perform tasks that involve climbing or major physical exertion for 24 hours (because of the sedation medicines used during the test).   SYMPTOMS TO REPORT IMMEDIATELY: A gastroenterologist can be reached at any hour. Please call [phone]  for any of the following symptoms:   Following upper endoscopy (EGD, EUS, ERCP, esophageal dilation) Vomiting of blood or coffee ground material  New, significant abdominal pain  New, significant chest pain or pain under the shoulder blades  Painful or  persistently difficult swallowing  New shortness of breath  Black, tarry-looking or red, bloody stools  FOLLOW UP:  If any biopsies were taken you will be contacted by phone or by letter within the next 1-3 weeks. Call [phone]  if you have not heard about the biopsies in 3 weeks.  Please also call with any specific questions about appointments or follow up tests.  

## 2020-02-10 NOTE — Anesthesia Procedure Notes (Signed)
Procedure Name: MAC Performed by: Sherrica Niehaus D, CRNA Oxygen Delivery Method: Nasal cannula       

## 2020-02-10 NOTE — Transfer of Care (Signed)
Immediate Anesthesia Transfer of Care Note  Patient: MARQUITTA PERSICHETTI  Procedure(s) Performed: UPPER ESOPHAGEAL ENDOSCOPIC ULTRASOUND (EUS) (N/A ) ESOPHAGOGASTRODUODENOSCOPY (EGD) WITH PROPOFOL (N/A ) FINE NEEDLE ASPIRATION (FNA) LINEAR (N/A )  Patient Location: PACU  Anesthesia Type:MAC  Level of Consciousness: awake, alert  and oriented  Airway & Oxygen Therapy: Patient Spontanous Breathing and Patient connected to nasal cannula oxygen  Post-op Assessment: Report given to RN and Post -op Vital signs reviewed and stable  Post vital signs: Reviewed and stable  Last Vitals:  Vitals Value Taken Time  BP    Temp    Pulse 97 02/10/20 1444  Resp 20 02/10/20 1444  SpO2 100 % 02/10/20 1444  Vitals shown include unvalidated device data.  Last Pain:  Vitals:   02/10/20 1443  TempSrc:   PainSc: 0-No pain         Complications: No apparent anesthesia complications

## 2020-02-10 NOTE — Op Note (Addendum)
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 Estimated blood loss:                            None. Estimated Blood Loss:     Estimated blood loss: none. Procedure:                Pre-Anesthesia Assessment:                           - Prior to the procedure, a History and Physical                            was performed, and patient medications and                            allergies were reviewed. The patient's tolerance of                            previous anesthesia was also reviewed. The risks                            and benefits of the procedure and the sedation                            options and risks were discussed with the patient.                            All questions were answered, and informed consent                            was obtained. Prior Anticoagulants: The patient has  taken no previous anticoagulant or antiplatelet                            agents. ASA Grade Assessment: II - A patient with                            mild systemic disease. After reviewing the risks                            and benefits, the patient was deemed in                            satisfactory condition to undergo the procedure.                           After obtaining informed consent,  the endoscope was                            passed under direct vision. Throughout the                            procedure, the patient's blood pressure, pulse, and                            oxygen saturations were monitored continuously. The                            [id number]) Olympus Radial EUS was                            introduced through the mouth, and advanced to the                            second part of duodenum. The upper EUS was                            accomplished without difficulty. The patient                            tolerated the procedure well. Scope In: Scope Out: Findings:      ENDOSCOPIC FINDING: :      1. Ulcerated, friable mass in the mid esophagus already proven to be       squamous cell carcinoma on previous biopsies. This mass is non       circumferential, 5-6cm long with proximal edge located 25cm from the       incisors.      2. Otherwise normal examination to the distal stomach.      ENDOSONOGRAPHIC FINDING: :      1. The mid esophagus mass described above correlates with a hypoechoic       mass that clearly passes into and through the muscularis propria layer       of the esophageal wall (uT3).      2. Three round, suspicious lymphnodes abut the esophagus very near to       the  primary tumor.      3. One malignant-appearing lymph node was visualized in the       gastrohepatic ligament (level 18). It measured 12 mm by 11 mm in maximal       cross-sectional diameter. The node was round, hypoechoic and had well       defined margins. Fine needle aspiration for cytology was performed.       Color Doppler imaging was utilized prior to needle puncture to confirm a       lack of significant vascular structures within the needle path. Two       passes were made with the 25 gauge needle using a transgastric approach.       A cytotechnologist was present to evaluate the adequacy of the specimen. Impression:               - Squamous cell  cancer of the mid esophagus with                            suspicious local (paraesophageal) adenopathy. PET                            avid gastrohepatic ligament LN was located and                            sampled by transgastric EUS guided FNA. Preliminary                            cytology was positive for metastatic squamous cell                            to the gastrohepatic lymphnode. Moderate Sedation:      Not Applicable - Patient had care per Anesthesia. Recommendation:           - Discharge patient to home. Procedure Code(s):        --- Professional ---                           [phone], Esophagogastroduodenoscopy, flexible,                            transoral; with transendoscopic ultrasound-guided                            intramural or transmural fine needle                            aspiration/biopsy(s), (includes endoscopic                            ultrasound examination limited to the esophagus,                            stomach or duodenum, and adjacent structures) Diagnosis Code(s):        --- Professional ---                           K20.90, Esophagitis, unspecified without bleeding  I89.9, Noninfective disorder of lymphatic vessels                            and lymph nodes, unspecified                           R59.1, Generalized enlarged lymph nodes CPT copyright 2019 American Medical Association. All rights reserved. The codes documented in this report are preliminary and upon coder review may  be revised to meet current compliance requirements. Milus Banister, MD 02/10/2020 2:43:31 PM This report has been signed electronically. Number of Addenda: 0

## 2020-02-10 NOTE — Anesthesia Preprocedure Evaluation (Addendum)
Anesthesia Evaluation  Patient identified by MRN, date of birth, ID band Patient awake    Reviewed: Allergy & Precautions, NPO status , Patient's Chart, lab work & pertinent test results  Airway Mallampati: III  TM Distance: >3 FB Neck ROM: Full    Dental no notable dental hx.    Pulmonary asthma ,    Pulmonary exam normal breath sounds clear to auscultation       Cardiovascular hypertension, + DVT  Normal cardiovascular exam Rhythm:Regular Rate:Normal     Neuro/Psych PSYCHIATRIC DISORDERS Anxiety Depression negative neurological ROS     GI/Hepatic negative GI ROS, Neg liver ROS,   Endo/Other  negative endocrine ROS  Renal/GU negative Renal ROS     Musculoskeletal  (+) Arthritis ,   Abdominal   Peds  Hematology negative hematology ROS (+)   Anesthesia Other Findings esophageal cancer staging  Reproductive/Obstetrics                            Anesthesia Physical Anesthesia Plan  ASA: II  Anesthesia Plan: MAC   Post-op Pain Management:    Induction: Intravenous  PONV Risk Score and Plan: 2 and Propofol infusion and Treatment may vary due to age or medical condition  Airway Management Planned: Nasal Cannula  Additional Equipment:   Intra-op Plan:   Post-operative Plan:   Informed Consent: I have reviewed the patients History and Physical, chart, labs and discussed the procedure including the risks, benefits and alternatives for the proposed anesthesia with the patient or authorized representative who has indicated his/her understanding and acceptance.     Dental advisory given  Plan Discussed with: CRNA  Anesthesia Plan Comments:        Anesthesia Quick Evaluation

## 2020-02-11 ENCOUNTER — Ambulatory Visit
Admission: RE | Admit: 2020-02-11 | Discharge: 2020-02-11 | Disposition: A | Discharge status: Home / Self Care | Payer: PPO | Source: Ambulatory Visit | Attending: Radiation Oncology | Admitting: Radiation Oncology

## 2020-02-11 ENCOUNTER — Other Ambulatory Visit: Payer: Self-pay

## 2020-02-11 ENCOUNTER — Encounter: Payer: Self-pay | Admitting: *Deleted

## 2020-02-11 DIAGNOSIS — Z510 Encounter for antineoplastic radiation therapy: Secondary | ICD-10-CM | POA: Diagnosis not present

## 2020-02-11 DIAGNOSIS — C158 Malignant neoplasm of overlapping sites of esophagus: Secondary | ICD-10-CM | POA: Diagnosis not present

## 2020-02-11 LAB — CYTOLOGY - NON PAP

## 2020-02-13 ENCOUNTER — Other Ambulatory Visit: Payer: Self-pay | Admitting: Oncology

## 2020-02-14 ENCOUNTER — Other Ambulatory Visit: Payer: Self-pay

## 2020-02-14 ENCOUNTER — Ambulatory Visit
Admission: RE | Admit: 2020-02-14 | Discharge: 2020-02-14 | Disposition: A | Discharge status: Home / Self Care | Payer: PPO | Source: Ambulatory Visit | Attending: Radiation Oncology | Admitting: Radiation Oncology

## 2020-02-14 ENCOUNTER — Encounter: Payer: Self-pay | Admitting: *Deleted

## 2020-02-14 ENCOUNTER — Inpatient Hospital Stay: Payer: PPO

## 2020-02-14 ENCOUNTER — Other Ambulatory Visit: Payer: Self-pay | Admitting: Oncology

## 2020-02-14 DIAGNOSIS — C159 Malignant neoplasm of esophagus, unspecified: Secondary | ICD-10-CM

## 2020-02-14 DIAGNOSIS — Z5111 Encounter for antineoplastic chemotherapy: Secondary | ICD-10-CM | POA: Diagnosis not present

## 2020-02-14 DIAGNOSIS — Z510 Encounter for antineoplastic radiation therapy: Secondary | ICD-10-CM | POA: Diagnosis not present

## 2020-02-14 DIAGNOSIS — C158 Malignant neoplasm of overlapping sites of esophagus: Secondary | ICD-10-CM

## 2020-02-14 LAB — CMP (CANCER CENTER ONLY)
ALT: 12 U/L (ref 0–44)
AST: 13 U/L — ABNORMAL LOW (ref 15–41)
Albumin: 3.3 g/dL — ABNORMAL LOW (ref 3.5–5.0)
Alkaline Phosphatase: 48 U/L (ref 38–126)
Anion gap: 8 (ref 5–15)
BUN: 9 mg/dL (ref 8–23)
CO2: 29 mmol/L (ref 22–32)
Calcium: 9 mg/dL (ref 8.9–10.3)
Chloride: 104 mmol/L (ref 98–111)
Creatinine: 0.72 mg/dL (ref 0.44–1.00)
GFR, Est AFR Am: >60 mL/min (ref >60)
GFR, Estimated: >60 mL/min (ref >60)
Glucose, Bld: 124 mg/dL — ABNORMAL HIGH (ref 70–99)
Potassium: 3.5 mmol/L (ref 3.5–5.1)
Sodium: 141 mmol/L (ref 135–145)
Total Bilirubin: 0.8 mg/dL (ref 0.3–1.2)
Total Protein: 6.3 g/dL — ABNORMAL LOW (ref 6.5–8.1)

## 2020-02-14 LAB — CBC WITH DIFFERENTIAL (CANCER CENTER ONLY)
Abs Immature Granulocytes: 0.01 10*3/uL (ref 0.00–0.07)
Basophils Absolute: 0 10*3/uL (ref 0.0–0.1)
Basophils Relative: 1 %
Eosinophils Absolute: 0.1 10*3/uL (ref 0.0–0.5)
Eosinophils Relative: 7 %
HCT: 38.8 % (ref 36.0–46.0)
Hemoglobin: 12.9 g/dL (ref 12.0–15.0)
Immature Granulocytes: 1 %
Lymphocytes Relative: 15 %
Lymphs Abs: 0.3 10*3/uL — ABNORMAL LOW (ref 0.7–4.0)
MCH: 32.4 pg (ref 26.0–34.0)
MCHC: 33.2 g/dL (ref 30.0–36.0)
MCV: 97.5 fL (ref 80.0–100.0)
Monocytes Absolute: 0.3 10*3/uL (ref 0.1–1.0)
Monocytes Relative: 13 %
Neutro Abs: 1.2 10*3/uL — ABNORMAL LOW (ref 1.7–7.7)
Neutrophils Relative %: 63 %
Platelet Count: 239 10*3/uL (ref 150–400)
RBC: 3.98 MIL/uL (ref 3.87–5.11)
RDW: 12.2 % (ref 11.5–15.5)
WBC Count: 1.9 10*3/uL — ABNORMAL LOW (ref 4.0–10.5)
nRBC: 0 % (ref 0.0–0.2)

## 2020-02-14 MED ORDER — HYDROCOD POLST-CPM POLST ER 10-8 MG/5ML PO SUER: 5.0000 mL | Freq: Two times a day (BID) | ORAL | 0 refills | Status: DC | PRN

## 2020-02-14 NOTE — Progress Notes (Signed)
Received email from patient that she has developed a cough, non-productive, no fever.  Has been taking OTC cough suppressant which helps some but doesn't last long.  She is asking for a prescription cough medication.  Spoke with Dr. Benay Spice he recommends Tussionex 5 cc every 12 hours.  He will send into her pharmacy Geneticist, molecular) on file.  I emailed her back to let her know this and cautioned her that it may make her groggy as it contains Hydrocodone and to not drive while taking this medication.

## 2020-02-14 NOTE — Progress Notes (Signed)
[id number] Study Staging Update; Reviewed results of recent EUS with Dr. Benay Spice.  He confirms clinical staging T3, N2, M0; Stage III Esophageal Cancer Jo Morrison, BSN, RN Clinical Research Nurse 02/14/2020 4:28 PM .

## 2020-02-15 ENCOUNTER — Ambulatory Visit
Admission: RE | Admit: 2020-02-15 | Discharge: 2020-02-15 | Disposition: A | Discharge status: Home / Self Care | Payer: PPO | Source: Ambulatory Visit | Attending: Radiation Oncology | Admitting: Radiation Oncology

## 2020-02-15 ENCOUNTER — Inpatient Hospital Stay: Payer: PPO

## 2020-02-15 ENCOUNTER — Other Ambulatory Visit: Payer: PPO

## 2020-02-15 ENCOUNTER — Other Ambulatory Visit: Payer: Self-pay

## 2020-02-15 VITALS — BP 124/77 | HR 100 | Temp 97.8°F | Resp 17

## 2020-02-15 DIAGNOSIS — C158 Malignant neoplasm of overlapping sites of esophagus: Secondary | ICD-10-CM | POA: Diagnosis not present

## 2020-02-15 DIAGNOSIS — Z510 Encounter for antineoplastic radiation therapy: Secondary | ICD-10-CM | POA: Diagnosis not present

## 2020-02-15 DIAGNOSIS — Z5111 Encounter for antineoplastic chemotherapy: Secondary | ICD-10-CM | POA: Diagnosis not present

## 2020-02-15 LAB — CBC WITH DIFFERENTIAL (CANCER CENTER ONLY)
Abs Immature Granulocytes: 0.05 10*3/uL (ref 0.00–0.07)
Basophils Absolute: 0 10*3/uL (ref 0.0–0.1)
Basophils Relative: 1 %
Eosinophils Absolute: 0.1 10*3/uL (ref 0.0–0.5)
Eosinophils Relative: 6 %
HCT: 36.1 % (ref 36.0–46.0)
Hemoglobin: 12.2 g/dL (ref 12.0–15.0)
Immature Granulocytes: 2 %
Lymphocytes Relative: 10 %
Lymphs Abs: 0.3 10*3/uL — ABNORMAL LOW (ref 0.7–4.0)
MCH: 32.1 pg (ref 26.0–34.0)
MCHC: 33.8 g/dL (ref 30.0–36.0)
MCV: 95 fL (ref 80.0–100.0)
Monocytes Absolute: 0.4 10*3/uL (ref 0.1–1.0)
Monocytes Relative: 15 %
Neutro Abs: 1.7 10*3/uL (ref 1.7–7.7)
Neutrophils Relative %: 66 %
Platelet Count: 243 10*3/uL (ref 150–400)
RBC: 3.8 MIL/uL — ABNORMAL LOW (ref 3.87–5.11)
RDW: 12.3 % (ref 11.5–15.5)
WBC Count: 2.6 10*3/uL — ABNORMAL LOW (ref 4.0–10.5)
nRBC: 0 % (ref 0.0–0.2)

## 2020-02-15 MED ORDER — DIPHENHYDRAMINE HCL 50 MG/ML IJ SOLN
INTRAMUSCULAR | Status: AC
  Filled 2020-02-15: qty 1

## 2020-02-15 MED ORDER — FAMOTIDINE IN NACL 20-0.9 MG/50ML-% IV SOLN
INTRAVENOUS | Status: AC
  Filled 2020-02-15: qty 50

## 2020-02-15 MED ORDER — FAMOTIDINE IN NACL 20-0.9 MG/50ML-% IV SOLN
20.0000 mg | Freq: Once | INTRAVENOUS | Status: AC
  Administered 2020-02-15: 20 mg via INTRAVENOUS

## 2020-02-15 MED ORDER — SODIUM CHLORIDE 0.9 % IV SOLN
Freq: Once | INTRAVENOUS | Status: AC
  Filled 2020-02-15: qty 250

## 2020-02-15 MED ORDER — SODIUM CHLORIDE 0.9 % IV SOLN: 10.0000 mg | Freq: Once | INTRAVENOUS | Status: DC

## 2020-02-15 MED ORDER — DEXAMETHASONE SODIUM PHOSPHATE 10 MG/ML IJ SOLN
INTRAMUSCULAR | Status: AC
  Filled 2020-02-15: qty 1

## 2020-02-15 MED ORDER — DIPHENHYDRAMINE HCL 50 MG/ML IJ SOLN
25.0000 mg | Freq: Once | INTRAMUSCULAR | Status: AC
  Administered 2020-02-15: 25 mg via INTRAVENOUS

## 2020-02-15 MED ORDER — SODIUM CHLORIDE 0.9 % IV SOLN
134.6000 mg | Freq: Once | INTRAVENOUS | Status: AC
  Administered 2020-02-15: 130 mg via INTRAVENOUS
  Filled 2020-02-15: qty 13

## 2020-02-15 MED ORDER — PALONOSETRON HCL INJECTION 0.25 MG/5ML
0.2500 mg | Freq: Once | INTRAVENOUS | Status: AC
  Administered 2020-02-15: 0.25 mg via INTRAVENOUS

## 2020-02-15 MED ORDER — PALONOSETRON HCL INJECTION 0.25 MG/5ML
INTRAVENOUS | Status: AC
  Filled 2020-02-15: qty 5

## 2020-02-15 MED ORDER — DEXAMETHASONE SODIUM PHOSPHATE 10 MG/ML IJ SOLN
10.0000 mg | Freq: Once | INTRAMUSCULAR | Status: AC
  Administered 2020-02-15: 10 mg via INTRAVENOUS

## 2020-02-15 MED ORDER — SODIUM CHLORIDE 0.9 % IV SOLN
50.0000 mg/m2 | Freq: Once | INTRAVENOUS | Status: AC
  Administered 2020-02-15: 78 mg via INTRAVENOUS
  Filled 2020-02-15: qty 13

## 2020-02-15 NOTE — Patient Instructions (Signed)
COVID-19 Vaccine Information can be found at: [email or web address] For questions related to vaccine distribution or appointments, please email [email or web address] or call [phone].   [place] Discharge Instructions for Patients Receiving Chemotherapy  Today you received the following chemotherapy agents: Paclitaxel (Taxol) and Carboplatin (Paraplatin)  To help prevent nausea and vomiting after your treatment, we encourage you to take your nausea medication as directed by your provider.   If you develop nausea and vomiting that is not controlled by your nausea medication, call the clinic.   BELOW ARE SYMPTOMS THAT SHOULD BE REPORTED IMMEDIATELY:  *FEVER GREATER THAN 100.5 F  *CHILLS WITH OR WITHOUT FEVER  NAUSEA AND VOMITING THAT IS NOT CONTROLLED WITH YOUR NAUSEA MEDICATION  *UNUSUAL SHORTNESS OF BREATH  *UNUSUAL BRUISING OR BLEEDING  TENDERNESS IN MOUTH AND THROAT WITH OR WITHOUT PRESENCE OF ULCERS  *URINARY PROBLEMS  *BOWEL PROBLEMS  UNUSUAL RASH Items with * indicate a potential emergency and should be followed up as soon as possible.  Feel free to call the clinic should you have any questions or concerns. The clinic phone number is [phone].  Please show the [place] at check-in to the Emergency Department and triage nurse.  Coronavirus (COVID-19) Are you at risk?  Are you at risk for the Coronavirus (COVID-19)?  To be considered HIGH RISK for Coronavirus (COVID-19), you have to meet the following criteria:  . Traveled to [place], [place], [place], [place] or [place]; or in the [place] to [place], [place], [place], or [place]; and have fever, cough, and shortness of breath within the last 2 weeks of travel OR . Been in close contact with a person diagnosed with COVID-19 within the last 2 weeks and have fever, cough, and shortness of breath . IF YOU DO NOT MEET  THESE CRITERIA, YOU ARE CONSIDERED LOW RISK FOR COVID-19.  What to do if you are HIGH RISK for COVID-19?  Marland Kitchen If you are having a medical emergency, call 911. . Seek medical care right away. Before you go to a doctor's office, urgent care or emergency department, call ahead and tell them about your recent travel, contact with someone diagnosed with COVID-19, and your symptoms. You should receive instructions from your physician's office regarding next steps of care.  . When you arrive at healthcare provider, tell the healthcare staff immediately you have returned from visiting [place], [place], [place], [place] or [place]; or traveled in the [place] to [place], [place], [place], or [place]; in the last two weeks or you have been in close contact with a person diagnosed with COVID-19 in the last 2 weeks.   . Tell the health care staff about your symptoms: fever, cough and shortness of breath. . After you have been seen by a medical provider, you will be either: o Tested for (COVID-19) and discharged home on quarantine except to seek medical care if symptoms worsen, and asked to  - Stay home and avoid contact with others until you get your results (4-5 days)  - Avoid travel on public transportation if possible (such as bus, train, or airplane) or o Sent to the Emergency Department by EMS for evaluation, COVID-19 testing, and possible admission depending on your condition and test results.  What to do if you are LOW RISK for COVID-19?  Reduce your risk of any infection by using the same precautions used for avoiding the common cold or flu:  Marland Kitchen Wash your hands often with soap and warm  water for at least 20 seconds.  If soap and water are not readily available, use an alcohol-based hand sanitizer with at least 60% alcohol.  . If coughing or sneezing, cover your mouth and nose by coughing or sneezing into the elbow areas of your shirt or coat, into a tissue or into your sleeve (not your  hands). . Avoid shaking hands with others and consider head nods or verbal greetings only. . Avoid touching your eyes, nose, or mouth with unwashed hands.  . Avoid close contact with people who are sick. . Avoid places or events with large numbers of people in one location, like concerts or sporting events. . Carefully consider travel plans you have or are making. . If you are planning any travel outside or inside the [place], visit the CDC's Travelers' Health webpage for the latest health notices. . If you have some symptoms but not all symptoms, continue to monitor at home and seek medical attention if your symptoms worsen. . If you are having a medical emergency, call 911.   [place] / e-Visit: [email or web address]         [facility]: [facility]: [phone]                   [facility]: [phone]

## 2020-02-16 ENCOUNTER — Ambulatory Visit: Payer: PPO | Admitting: Adult Health

## 2020-02-16 ENCOUNTER — Ambulatory Visit
Admission: RE | Admit: 2020-02-16 | Discharge: 2020-02-16 | Disposition: A | Discharge status: Home / Self Care | Payer: PPO | Source: Ambulatory Visit | Attending: Radiation Oncology | Admitting: Radiation Oncology

## 2020-02-16 ENCOUNTER — Other Ambulatory Visit: Payer: Self-pay

## 2020-02-16 DIAGNOSIS — C158 Malignant neoplasm of overlapping sites of esophagus: Secondary | ICD-10-CM | POA: Diagnosis not present

## 2020-02-16 DIAGNOSIS — Z510 Encounter for antineoplastic radiation therapy: Secondary | ICD-10-CM | POA: Diagnosis not present

## 2020-02-17 ENCOUNTER — Other Ambulatory Visit: Payer: Self-pay

## 2020-02-17 ENCOUNTER — Ambulatory Visit
Admission: RE | Admit: 2020-02-17 | Discharge: 2020-02-17 | Disposition: A | Discharge status: Home / Self Care | Payer: PPO | Source: Ambulatory Visit | Attending: Radiation Oncology | Admitting: Radiation Oncology

## 2020-02-17 DIAGNOSIS — C158 Malignant neoplasm of overlapping sites of esophagus: Secondary | ICD-10-CM | POA: Diagnosis not present

## 2020-02-17 DIAGNOSIS — Z510 Encounter for antineoplastic radiation therapy: Secondary | ICD-10-CM | POA: Diagnosis not present

## 2020-02-18 ENCOUNTER — Other Ambulatory Visit: Payer: Self-pay

## 2020-02-18 ENCOUNTER — Ambulatory Visit
Admission: RE | Admit: 2020-02-18 | Discharge: 2020-02-18 | Disposition: A | Discharge status: Home / Self Care | Payer: PPO | Source: Ambulatory Visit | Attending: Radiation Oncology | Admitting: Radiation Oncology

## 2020-02-18 ENCOUNTER — Encounter: Payer: Self-pay | Admitting: General Practice

## 2020-02-18 ENCOUNTER — Other Ambulatory Visit: Payer: Self-pay | Admitting: Radiation Oncology

## 2020-02-18 DIAGNOSIS — C158 Malignant neoplasm of overlapping sites of esophagus: Secondary | ICD-10-CM | POA: Diagnosis not present

## 2020-02-18 DIAGNOSIS — Z510 Encounter for antineoplastic radiation therapy: Secondary | ICD-10-CM | POA: Diagnosis not present

## 2020-02-18 MED ORDER — SUCRALFATE 1 G PO TABS: 1.0000 g | ORAL_TABLET | Freq: Four times a day (QID) | ORAL | 2 refills | Status: DC

## 2020-02-18 NOTE — Progress Notes (Signed)
[facility] Spiritual Care Note  Reached out to Jo Morrison by phone to introduce Spiritual Care as part of her Support Team. Left detailed message with direct dial number and encouragement to call whenever needed/desired.   [place], [place], [facility] Pager [phone] Voicemail [phone]

## 2020-02-20 ENCOUNTER — Other Ambulatory Visit: Payer: Self-pay | Admitting: Oncology

## 2020-02-21 ENCOUNTER — Inpatient Hospital Stay: Payer: PPO

## 2020-02-21 ENCOUNTER — Ambulatory Visit
Admission: RE | Admit: 2020-02-21 | Discharge: 2020-02-21 | Disposition: A | Discharge status: Home / Self Care | Payer: PPO | Source: Ambulatory Visit | Attending: Radiation Oncology | Admitting: Radiation Oncology

## 2020-02-21 ENCOUNTER — Inpatient Hospital Stay (HOSPITAL_BASED_OUTPATIENT_CLINIC_OR_DEPARTMENT_OTHER): Payer: PPO | Admitting: Oncology

## 2020-02-21 ENCOUNTER — Other Ambulatory Visit: Payer: Self-pay

## 2020-02-21 ENCOUNTER — Other Ambulatory Visit: Payer: PPO

## 2020-02-21 VITALS — BP 126/78 | HR 114 | Temp 97.8°F | Resp 18 | Ht 63.0 in | Wt 113.9 lb

## 2020-02-21 DIAGNOSIS — Z510 Encounter for antineoplastic radiation therapy: Secondary | ICD-10-CM | POA: Diagnosis not present

## 2020-02-21 DIAGNOSIS — C158 Malignant neoplasm of overlapping sites of esophagus: Secondary | ICD-10-CM

## 2020-02-21 DIAGNOSIS — Z5111 Encounter for antineoplastic chemotherapy: Secondary | ICD-10-CM | POA: Diagnosis not present

## 2020-02-21 DIAGNOSIS — C159 Malignant neoplasm of esophagus, unspecified: Secondary | ICD-10-CM

## 2020-02-21 LAB — CBC WITH DIFFERENTIAL (CANCER CENTER ONLY)
Abs Immature Granulocytes: 0.03 10*3/uL (ref 0.00–0.07)
Basophils Absolute: 0 10*3/uL (ref 0.0–0.1)
Basophils Relative: 1 %
Eosinophils Absolute: 0.1 10*3/uL (ref 0.0–0.5)
Eosinophils Relative: 2 %
HCT: 35.6 % — ABNORMAL LOW (ref 36.0–46.0)
Hemoglobin: 12.1 g/dL (ref 12.0–15.0)
Immature Granulocytes: 1 %
Lymphocytes Relative: 6 %
Lymphs Abs: 0.2 10*3/uL — ABNORMAL LOW (ref 0.7–4.0)
MCH: 32.1 pg (ref 26.0–34.0)
MCHC: 34 g/dL (ref 30.0–36.0)
MCV: 94.4 fL (ref 80.0–100.0)
Monocytes Absolute: 0.2 10*3/uL (ref 0.1–1.0)
Monocytes Relative: 9 %
Neutro Abs: 2.1 10*3/uL (ref 1.7–7.7)
Neutrophils Relative %: 81 %
Platelet Count: 244 10*3/uL (ref 150–400)
RBC: 3.77 MIL/uL — ABNORMAL LOW (ref 3.87–5.11)
RDW: 12.1 % (ref 11.5–15.5)
WBC Count: 2.6 10*3/uL — ABNORMAL LOW (ref 4.0–10.5)
nRBC: 0 % (ref 0.0–0.2)

## 2020-02-21 LAB — CMP (CANCER CENTER ONLY)
ALT: 12 U/L (ref 0–44)
AST: 16 U/L (ref 15–41)
Albumin: 3.4 g/dL — ABNORMAL LOW (ref 3.5–5.0)
Alkaline Phosphatase: 44 U/L (ref 38–126)
Anion gap: 7 (ref 5–15)
BUN: 10 mg/dL (ref 8–23)
CO2: 28 mmol/L (ref 22–32)
Calcium: 8.9 mg/dL (ref 8.9–10.3)
Chloride: 106 mmol/L (ref 98–111)
Creatinine: 0.71 mg/dL (ref 0.44–1.00)
GFR, Est AFR Am: >60 mL/min (ref >60)
GFR, Estimated: >60 mL/min (ref >60)
Glucose, Bld: 146 mg/dL — ABNORMAL HIGH (ref 70–99)
Potassium: 3.5 mmol/L (ref 3.5–5.1)
Sodium: 141 mmol/L (ref 135–145)
Total Bilirubin: 0.8 mg/dL (ref 0.3–1.2)
Total Protein: 6.4 g/dL — ABNORMAL LOW (ref 6.5–8.1)

## 2020-02-21 NOTE — Progress Notes (Signed)
[facility] OFFICE PROGRESS NOTE   Diagnosis: Esophagus cancer  INTERVAL HISTORY:    Jo Morrison returns as scheduled.  She continues radiation.  She completed week 3 of Taxol/carboplatin on 02/15/2020.  No nausea.  She has mild peripheral numbness.  This does not interfere with activity.  Constant chest/back pain has improved, but she has developed odynophagia.  She started Carafate yesterday.  She has been drinking fluids and is tolerating liquid nutrition supplements.  She underwent an EUS and biopsy of a gastrohepatic lymph node on 02/10/2020.  Objective:  Vital signs in last 24 hours:  Blood pressure 126/78, pulse (!) 114, [name], temperature source Temporal, resp. rate 18, height _0  (1.6 m), weight 113 lb 14.4 oz (51.7 kg), SpO2 100 %.  Repeat manual pulse 104    HEENT: No thrush or ulcers, the mucous membranes are moist Cardio: Regular rate and rhythm GI: Nontender, no hepatomegaly, no mass Vascular: No leg edema  Skin: Mild rash at the mid anterior chest and mid upper back   Lab Results:  Lab Results  Component Value Date   WBC 2.6 (L) 02/21/2020   HGB 12.1 02/21/2020   HCT 35.6 (L) 02/21/2020   MCV 94.4 02/21/2020   PLT 244 02/21/2020   NEUTROABS 2.1 02/21/2020    CMP  Lab Results  Component Value Date   NA 141 02/14/2020   K 3.5 02/14/2020   CL 104 02/14/2020   CO2 29 02/14/2020   GLUCOSE 124 (H) 02/14/2020   BUN 9 02/14/2020   CREATININE 0.72 02/14/2020   CALCIUM 9.0 02/14/2020   PROT 6.3 (L) 02/14/2020   ALBUMIN 3.3 (L) 02/14/2020   AST 13 (L) 02/14/2020   ALT 12 02/14/2020   ALKPHOS 48 02/14/2020   BILITOT 0.8 02/14/2020   GFRNONAA >60 02/14/2020   GFRAA >60 02/14/2020     Medications: I have reviewed the patient's current medications.   Assessment/Plan: 1. 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 Upper endoscopy 01/14/2020-ulcerated mass in the proximal/mid esophagus at 26-31  cm from the incisors, biopsy confirmed invasive moderately differentiated squamous cell carcinoma ? CT abdomen/pelvis 12/09/2019-uterine fibroids ? CT chest 12/30/2019-normal esophagus, new sclerosis of the medial head of the right clavicle likely degenerative ? PET scan 01/24/2020-long segment of hypermetabolic thickening of the distal esophagus.  For hypermetabolic metastatic lymph nodes, 3 in the mediastinum and 1 in the upper abdomen gastrohepatic ligament.  ? EUS 02/10/2020-ulcerated mass in the mid esophagus,uT3, 3 suspicious lymph nodes near the primary tumor, malignant appearing lymph node at the gastrohepatic ligament (level 18)-metastatic squamous cell carcinoma ? Radiation 01/31/2020 ? Cycle 1 weekly Taxol/carboplatin 02/01/2020 2. Right breast cancer July 1996, [name], [name], right lumpectomy and axillary dissection followed by adjuvant Adriamycin/Taxol chemotherapy-4 cycles, 5 years of tamoxifen, 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, macular edema, and corneal edema  7.  Asthma  8.  Odynophagia secondary to radiation    Disposition:  [name] is tolerating the treatment well, though she has developed odynophagia.  She continues to tolerate a diet.  I encouraged her to continue nutrition supplements as tolerated.  She will complete week for Taxol/carboplatin on 02/22/2020.  We discussed the staging EUS result.  There is biopsy confirmation of  the gastrohepatic node.  She appears to have advanced stage III esophagus cancer.  I think it is unlikely she will be a candidate for surgical resection based on the extent of lymph node involvement and her age.  She is scheduled for consultation with Dr. Servando Snare next week.  She will return for an office visit in 1 week.  Betsy Coder,  MD  02/21/2020  10:13 AM

## 2020-02-22 ENCOUNTER — Other Ambulatory Visit: Payer: PPO

## 2020-02-22 ENCOUNTER — Inpatient Hospital Stay: Payer: PPO

## 2020-02-22 ENCOUNTER — Ambulatory Visit
Admission: RE | Admit: 2020-02-22 | Discharge: 2020-02-22 | Disposition: A | Discharge status: Home / Self Care | Payer: PPO | Source: Ambulatory Visit | Attending: Radiation Oncology | Admitting: Radiation Oncology

## 2020-02-22 ENCOUNTER — Other Ambulatory Visit: Payer: Self-pay

## 2020-02-22 ENCOUNTER — Ambulatory Visit: Payer: PPO

## 2020-02-22 ENCOUNTER — Inpatient Hospital Stay: Payer: PPO | Admitting: Nutrition

## 2020-02-22 ENCOUNTER — Ambulatory Visit: Payer: PPO | Admitting: Oncology

## 2020-02-22 VITALS — BP 120/67 | HR 100 | Temp 98.2°F | Resp 18

## 2020-02-22 DIAGNOSIS — C158 Malignant neoplasm of overlapping sites of esophagus: Secondary | ICD-10-CM

## 2020-02-22 DIAGNOSIS — Z510 Encounter for antineoplastic radiation therapy: Secondary | ICD-10-CM | POA: Diagnosis not present

## 2020-02-22 DIAGNOSIS — Z5111 Encounter for antineoplastic chemotherapy: Secondary | ICD-10-CM | POA: Diagnosis not present

## 2020-02-22 MED ORDER — SODIUM CHLORIDE 0.9 % IV SOLN: 10.0000 mg | Freq: Once | INTRAVENOUS | Status: DC

## 2020-02-22 MED ORDER — DIPHENHYDRAMINE HCL 50 MG/ML IJ SOLN
25.0000 mg | Freq: Once | INTRAMUSCULAR | Status: AC
  Administered 2020-02-22: 25 mg via INTRAVENOUS

## 2020-02-22 MED ORDER — SODIUM CHLORIDE 0.9 % IV SOLN
130.6000 mg | Freq: Once | INTRAVENOUS | Status: AC
  Administered 2020-02-22: 130 mg via INTRAVENOUS
  Filled 2020-02-22: qty 13

## 2020-02-22 MED ORDER — PALONOSETRON HCL INJECTION 0.25 MG/5ML
0.2500 mg | Freq: Once | INTRAVENOUS | Status: AC
  Administered 2020-02-22: 0.25 mg via INTRAVENOUS

## 2020-02-22 MED ORDER — DEXAMETHASONE SODIUM PHOSPHATE 10 MG/ML IJ SOLN
INTRAMUSCULAR | Status: AC
  Filled 2020-02-22: qty 1

## 2020-02-22 MED ORDER — PALONOSETRON HCL INJECTION 0.25 MG/5ML
INTRAVENOUS | Status: AC
  Filled 2020-02-22: qty 5

## 2020-02-22 MED ORDER — DEXAMETHASONE SODIUM PHOSPHATE 10 MG/ML IJ SOLN
10.0000 mg | Freq: Once | INTRAMUSCULAR | Status: AC
  Administered 2020-02-22: 10 mg via INTRAVENOUS

## 2020-02-22 MED ORDER — FAMOTIDINE IN NACL 20-0.9 MG/50ML-% IV SOLN
20.0000 mg | Freq: Once | INTRAVENOUS | Status: AC
  Administered 2020-02-22: 20 mg via INTRAVENOUS

## 2020-02-22 MED ORDER — DIPHENHYDRAMINE HCL 50 MG/ML IJ SOLN
INTRAMUSCULAR | Status: AC
  Filled 2020-02-22: qty 1

## 2020-02-22 MED ORDER — SODIUM CHLORIDE 0.9 % IV SOLN
50.0000 mg/m2 | Freq: Once | INTRAVENOUS | Status: AC
  Administered 2020-02-22: 78 mg via INTRAVENOUS
  Filled 2020-02-22: qty 13

## 2020-02-22 MED ORDER — FAMOTIDINE IN NACL 20-0.9 MG/50ML-% IV SOLN
INTRAVENOUS | Status: AC
  Filled 2020-02-22: qty 50

## 2020-02-22 MED ORDER — SODIUM CHLORIDE 0.9 % IV SOLN
Freq: Once | INTRAVENOUS | Status: AC
  Filled 2020-02-22: qty 250

## 2020-02-22 NOTE — Progress Notes (Signed)
Brief nutrition follow-up completed with patient during infusion for esophageal cancer. Weight stable overall and documented as 113.9 pounds on March 29. Patient continues to have dysphagia and difficulty swallowing. She continues to drink protein shakes and make smoothies. She denies other nutrition impact symptoms.  Nutrition diagnosis: Unintended weight loss stable.  Intervention: Enforced importance of continued strategies for trying to maintain present weight. Continue to find ways to add calories and protein and soft foods or to homemade smoothies and oral nutrition supplements.  Monitoring, evaluation, goals: Patient is maintaining weight.  Next visit: Tuesday, April 6 during infusion.  **Disclaimer: This note was dictated with voice recognition software. Similar sounding words can inadvertently be transcribed and this note may contain transcription errors which may not have been corrected upon publication of note.**  '

## 2020-02-22 NOTE — Patient Instructions (Signed)
COVID-19 Vaccine Information can be found at: [email or web address].uk For questions related to vaccine distribution or appointments, please email [email or web address] or call [phone].   [facility] Discharge Instructions for Patients Receiving Chemotherapy  Today you received the following chemotherapy agents: Paclitaxel (Taxol) and Carboplatin (Paraplatin)  To help prevent nausea and vomiting after your treatment, we encourage you to take your nausea medication as directed by your provider.   If you develop nausea and vomiting that is not controlled by your nausea medication, call the clinic.   BELOW ARE SYMPTOMS THAT SHOULD BE REPORTED IMMEDIATELY:  *FEVER GREATER THAN 100.5 F  *CHILLS WITH OR WITHOUT FEVER  NAUSEA AND VOMITING THAT IS NOT CONTROLLED WITH YOUR NAUSEA MEDICATION  *UNUSUAL SHORTNESS OF BREATH  *UNUSUAL BRUISING OR BLEEDING  TENDERNESS IN MOUTH AND THROAT WITH OR WITHOUT PRESENCE OF ULCERS  *URINARY PROBLEMS  *BOWEL PROBLEMS  UNUSUAL RASH Items with * indicate a potential emergency and should be followed up as soon as possible.  Feel free to call the clinic should you have any questions or concerns. The clinic phone number is [phone].  Please show the [facility] at check-in to the Emergency Department and triage nurse.  Coronavirus (COVID-19) Are you at risk?  Are you at risk for the Coronavirus (COVID-19)?  To be considered HIGH RISK for Coronavirus (COVID-19), you have to meet the following criteria:  . Traveled to [place], [place], [place], [place] or [place]; or in the [place] to [place], [place], [place], or [place]; and have fever, cough, and shortness of breath within the last 2 weeks of travel OR . Been in close contact with a person diagnosed with COVID-19 within the last 2 weeks and have fever, cough, and shortness of breath . IF YOU DO NOT MEET  THESE CRITERIA, YOU ARE CONSIDERED LOW RISK FOR COVID-19.  What to do if you are HIGH RISK for COVID-19?  Marland Kitchen If you are having a medical emergency, call 911. . Seek medical care right away. Before you go to a doctor's office, urgent care or emergency department, call ahead and tell them about your recent travel, contact with someone diagnosed with COVID-19, and your symptoms. You should receive instructions from your physician's office regarding next steps of care.  . When you arrive at healthcare provider, tell the healthcare staff immediately you have returned from visiting [place], [place], [place], [place] or [place]; or traveled in the [place] to [place], [place], [place], or [place]; in the last two weeks or you have been in close contact with a person diagnosed with COVID-19 in the last 2 weeks.   . Tell the health care staff about your symptoms: fever, cough and shortness of breath. . After you have been seen by a medical provider, you will be either: o Tested for (COVID-19) and discharged home on quarantine except to seek medical care if symptoms worsen, and asked to  - Stay home and avoid contact with others until you get your results (4-5 days)  - Avoid travel on public transportation if possible (such as bus, train, or airplane) or o Sent to the Emergency Department by EMS for evaluation, COVID-19 testing, and possible admission depending on your condition and test results.  What to do if you are LOW RISK for COVID-19?  Reduce your risk of any infection by using the same precautions used for avoiding the common cold or flu:  Marland Kitchen Wash your hands often with soap and warm  water for at least 20 seconds.  If soap and water are not readily available, use an alcohol-based hand sanitizer with at least 60% alcohol.  . If coughing or sneezing, cover your mouth and nose by coughing or sneezing into the elbow areas of your shirt or coat, into a tissue or into your sleeve (not your  hands). . Avoid shaking hands with others and consider head nods or verbal greetings only. . Avoid touching your eyes, nose, or mouth with unwashed hands.  . Avoid close contact with people who are sick. . Avoid places or events with large numbers of people in one location, like concerts or sporting events. . Carefully consider travel plans you have or are making. . If you are planning any travel outside or inside the [place], visit the CDC's Travelers' Health webpage for the latest health notices. . If you have some symptoms but not all symptoms, continue to monitor at home and seek medical attention if your symptoms worsen. . If you are having a medical emergency, call 911.   [place] / e-Visit: [email or web address]         [facility]: [facility]: [phone]                   [facility]: [phone]

## 2020-02-23 ENCOUNTER — Ambulatory Visit
Admission: RE | Admit: 2020-02-23 | Discharge: 2020-02-23 | Disposition: A | Discharge status: Home / Self Care | Payer: PPO | Source: Ambulatory Visit | Attending: Radiation Oncology | Admitting: Radiation Oncology

## 2020-02-23 ENCOUNTER — Other Ambulatory Visit: Payer: Self-pay

## 2020-02-23 DIAGNOSIS — Z510 Encounter for antineoplastic radiation therapy: Secondary | ICD-10-CM | POA: Diagnosis not present

## 2020-02-23 DIAGNOSIS — C158 Malignant neoplasm of overlapping sites of esophagus: Secondary | ICD-10-CM | POA: Diagnosis not present

## 2020-02-23 NOTE — Progress Notes (Signed)
Pharmacist Chemotherapy Monitoring - Follow Up Assessment    I verify that I have reviewed each item in the below checklist:  . Regimen for the patient is scheduled for the appropriate day and plan matches scheduled date. Marland Kitchen Appropriate non-routine labs are ordered dependent on drug ordered. . If applicable, additional medications reviewed and ordered per protocol based on lifetime cumulative doses and/or treatment regimen.   Plan for follow-up and/or issues identified: No . I-vent associated with next due treatment: No . MD and/or nursing notified: No  Jo Morrison K 02/23/2020 11:23 AM

## 2020-02-24 ENCOUNTER — Other Ambulatory Visit: Payer: Self-pay

## 2020-02-24 ENCOUNTER — Ambulatory Visit
Admission: RE | Admit: 2020-02-24 | Discharge: 2020-02-24 | Disposition: A | Discharge status: Home / Self Care | Payer: PPO | Source: Ambulatory Visit | Attending: Radiation Oncology | Admitting: Radiation Oncology

## 2020-02-24 DIAGNOSIS — Z510 Encounter for antineoplastic radiation therapy: Secondary | ICD-10-CM | POA: Diagnosis not present

## 2020-02-24 DIAGNOSIS — C159 Malignant neoplasm of esophagus, unspecified: Secondary | ICD-10-CM | POA: Insufficient documentation

## 2020-02-24 DIAGNOSIS — C158 Malignant neoplasm of overlapping sites of esophagus: Secondary | ICD-10-CM | POA: Diagnosis not present

## 2020-02-25 ENCOUNTER — Other Ambulatory Visit: Payer: Self-pay

## 2020-02-25 ENCOUNTER — Ambulatory Visit
Admission: RE | Admit: 2020-02-25 | Discharge: 2020-02-25 | Disposition: A | Discharge status: Home / Self Care | Payer: PPO | Source: Ambulatory Visit | Attending: Radiation Oncology | Admitting: Radiation Oncology

## 2020-02-25 DIAGNOSIS — C158 Malignant neoplasm of overlapping sites of esophagus: Secondary | ICD-10-CM | POA: Diagnosis not present

## 2020-02-25 DIAGNOSIS — Z510 Encounter for antineoplastic radiation therapy: Secondary | ICD-10-CM | POA: Diagnosis not present

## 2020-02-27 ENCOUNTER — Other Ambulatory Visit: Payer: Self-pay | Admitting: Oncology

## 2020-02-28 ENCOUNTER — Inpatient Hospital Stay (HOSPITAL_BASED_OUTPATIENT_CLINIC_OR_DEPARTMENT_OTHER): Payer: PPO | Admitting: Nurse Practitioner

## 2020-02-28 ENCOUNTER — Encounter: Payer: Self-pay | Admitting: Nurse Practitioner

## 2020-02-28 ENCOUNTER — Other Ambulatory Visit: Payer: Self-pay

## 2020-02-28 ENCOUNTER — Ambulatory Visit
Admission: RE | Admit: 2020-02-28 | Discharge: 2020-02-28 | Disposition: A | Discharge status: Home / Self Care | Payer: PPO | Source: Ambulatory Visit | Attending: Radiation Oncology | Admitting: Radiation Oncology

## 2020-02-28 ENCOUNTER — Inpatient Hospital Stay: Payer: PPO | Attending: Oncology

## 2020-02-28 ENCOUNTER — Telehealth: Payer: Self-pay | Admitting: *Deleted

## 2020-02-28 VITALS — BP 139/87 | HR 100 | Temp 98.0°F | Resp 18 | Ht 63.0 in | Wt 112.3 lb

## 2020-02-28 DIAGNOSIS — Z853 Personal history of malignant neoplasm of breast: Secondary | ICD-10-CM | POA: Diagnosis not present

## 2020-02-28 DIAGNOSIS — C158 Malignant neoplasm of overlapping sites of esophagus: Secondary | ICD-10-CM

## 2020-02-28 DIAGNOSIS — Z510 Encounter for antineoplastic radiation therapy: Secondary | ICD-10-CM | POA: Diagnosis not present

## 2020-02-28 DIAGNOSIS — I10 Essential (primary) hypertension: Secondary | ICD-10-CM | POA: Diagnosis not present

## 2020-02-28 DIAGNOSIS — H1820 Unspecified corneal edema: Secondary | ICD-10-CM | POA: Insufficient documentation

## 2020-02-28 DIAGNOSIS — R1310 Dysphagia, unspecified: Secondary | ICD-10-CM | POA: Insufficient documentation

## 2020-02-28 DIAGNOSIS — Z5111 Encounter for antineoplastic chemotherapy: Secondary | ICD-10-CM | POA: Insufficient documentation

## 2020-02-28 DIAGNOSIS — C154 Malignant neoplasm of middle third of esophagus: Secondary | ICD-10-CM | POA: Diagnosis not present

## 2020-02-28 DIAGNOSIS — E860 Dehydration: Secondary | ICD-10-CM | POA: Insufficient documentation

## 2020-02-28 DIAGNOSIS — Z9221 Personal history of antineoplastic chemotherapy: Secondary | ICD-10-CM | POA: Insufficient documentation

## 2020-02-28 DIAGNOSIS — F329 Major depressive disorder, single episode, unspecified: Secondary | ICD-10-CM | POA: Insufficient documentation

## 2020-02-28 DIAGNOSIS — Z923 Personal history of irradiation: Secondary | ICD-10-CM | POA: Insufficient documentation

## 2020-02-28 DIAGNOSIS — J45909 Unspecified asthma, uncomplicated: Secondary | ICD-10-CM | POA: Diagnosis not present

## 2020-02-28 DIAGNOSIS — H209 Unspecified iridocyclitis: Secondary | ICD-10-CM | POA: Insufficient documentation

## 2020-02-28 LAB — CBC WITH DIFFERENTIAL (CANCER CENTER ONLY)
Abs Immature Granulocytes: 0.03 10*3/uL (ref 0.00–0.07)
Basophils Absolute: 0 10*3/uL (ref 0.0–0.1)
Basophils Relative: 1 %
Eosinophils Absolute: 0.1 10*3/uL (ref 0.0–0.5)
Eosinophils Relative: 2 %
HCT: 36 % (ref 36.0–46.0)
Hemoglobin: 12 g/dL (ref 12.0–15.0)
Immature Granulocytes: 1 %
Lymphocytes Relative: 5 %
Lymphs Abs: 0.2 10*3/uL — ABNORMAL LOW (ref 0.7–4.0)
MCH: 32.5 pg (ref 26.0–34.0)
MCHC: 33.3 g/dL (ref 30.0–36.0)
MCV: 97.6 fL (ref 80.0–100.0)
Monocytes Absolute: 0.3 10*3/uL (ref 0.1–1.0)
Monocytes Relative: 10 %
Neutro Abs: 2.5 10*3/uL (ref 1.7–7.7)
Neutrophils Relative %: 81 %
Platelet Count: 186 10*3/uL (ref 150–400)
RBC: 3.69 MIL/uL — ABNORMAL LOW (ref 3.87–5.11)
RDW: 12.3 % (ref 11.5–15.5)
WBC Count: 3.1 10*3/uL — ABNORMAL LOW (ref 4.0–10.5)
nRBC: 0 % (ref 0.0–0.2)

## 2020-02-28 NOTE — Progress Notes (Addendum)
[place] OFFICE PROGRESS NOTE   Diagnosis: Esophagus cancer  INTERVAL HISTORY:   Jo Morrison returns as scheduled.  She continues radiation.  She completed cycle 4 weekly Taxol/carboplatin 02/22/2020.  She denies nausea/vomiting.  No mouth sores.  No diarrhea or constipation.  No change in baseline neuropathy symptoms involving the right hand, predated chemotherapy.  She is tolerating liquids.  She is drinking nutritional supplements.  Some odynophagia.  Objective:  Vital signs in last 24 hours:  Blood pressure (!) 142/94, pulse 100, temperature 98 F (36.7 C), temperature source Temporal, resp. rate 18, height _0  (1.6 m), weight 112 lb 4.8 oz (50.9 kg).    HEENT: Mild white coating over tongue.  No ulcers. GI: Abdomen soft and nontender.  No hepatomegaly. Vascular: No leg edema. Neuro: Vibratory sense mildly decreased over the fingertips on the right hand, intact left hand per tuning fork exam. Skin: Palms without erythema.   Lab Results:  Lab Results  Component Value Date   WBC 3.1 (L) 02/28/2020   HGB 12.0 02/28/2020   HCT 36.0 02/28/2020   MCV 97.6 02/28/2020   PLT 186 02/28/2020   NEUTROABS 2.5 02/28/2020    Imaging:  No results found.  Medications: I have reviewed the patient's current medications.  Assessment/Plan: 1. 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 Upper endoscopy 01/14/2020-ulcerated mass in the proximal/mid esophagus at 26-31 cm from the incisors, biopsy confirmed invasive moderately differentiated squamous cell carcinoma ? CT abdomen/pelvis 12/09/2019-uterine fibroids ? CT chest 12/30/2019-normal esophagus, new sclerosis of the medial head of the right clavicle likely degenerative ? PET scan 01/24/2020-long segment of hypermetabolic thickening of the distal esophagus.  For hypermetabolic metastatic lymph nodes, 3 in the mediastinum and 1 in the upper abdomen gastrohepatic ligament.  ? EUS 02/10/2020-ulcerated  mass in the mid esophagus,uT3, 3 suspicious lymph nodes near the primary tumor, malignant appearing lymph node at the gastrohepatic ligament (level 18)-metastatic squamous cell carcinoma ? Radiation 01/31/2020 ? Cycle 1 weekly Taxol/carboplatin 02/01/2020 ? Cycle 2 weekly Taxol/carboplatin 02/08/2020 ? Cycle 3 weekly Taxol/carboplatin 02/15/2020 ? Cycle 4 weekly Taxol/carboplatin 02/22/2020 ? Cycle 5 weekly Taxol/carboplatin 02/29/2020 2. 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  Odynophagia secondary to radiation  Disposition: Jo Morrison appears stable.  She continues radiation.  She has completed 4 cycles of weekly Taxol/carboplatin.  She continues to tolerate the chemotherapy well.  Plan to proceed with the fifth and final cycle on 02/29/2020.  She will complete the course of radiation on 03/08/2020.  We reviewed the CBC from today.  Counts adequate to proceed with chemotherapy on 02/29/2020.  She will return for CBC and follow-up visit in 2 to 3 weeks.  She will contact the office in the interim with any problems.  Patient seen with Dr. Benay Spice.      Ned Card ANP/GNP-BC   02/28/2020  11:34 AM This was a shared visit with Ned Card.  Jo Morrison complete a final cycle of chemotherapy tomorrow.  She continues daily radiation.  We discussed recent data supporting the use of nivolumab in patients with resected esophagus cancer.  It is unclear whether  she will be a surgical candidate.  She is scheduled to see Dr. Servando Snare later this week.  We requested PD1 testing on the esophagus  biopsy.  Julieanne Manson, MD

## 2020-02-28 NOTE — Telephone Encounter (Signed)
Per Ned Card, NP, called to request PD1 from 01/14/20. Talked with Rosann Auerbach at [facility], will put in request with pathologist.

## 2020-02-29 ENCOUNTER — Encounter: Payer: Self-pay | Admitting: General Practice

## 2020-02-29 ENCOUNTER — Telehealth: Payer: Self-pay | Admitting: Genetic Counselor

## 2020-02-29 ENCOUNTER — Inpatient Hospital Stay: Payer: PPO

## 2020-02-29 ENCOUNTER — Other Ambulatory Visit: Payer: Self-pay

## 2020-02-29 ENCOUNTER — Encounter: Payer: Self-pay | Admitting: Genetic Counselor

## 2020-02-29 ENCOUNTER — Ambulatory Visit
Admission: RE | Admit: 2020-02-29 | Discharge: 2020-02-29 | Disposition: A | Discharge status: Home / Self Care | Payer: PPO | Source: Ambulatory Visit | Attending: Radiation Oncology | Admitting: Radiation Oncology

## 2020-02-29 ENCOUNTER — Inpatient Hospital Stay: Payer: PPO | Admitting: Nutrition

## 2020-02-29 VITALS — BP 147/83 | HR 109 | Temp 97.8°F | Resp 17

## 2020-02-29 DIAGNOSIS — C158 Malignant neoplasm of overlapping sites of esophagus: Secondary | ICD-10-CM

## 2020-02-29 DIAGNOSIS — Z510 Encounter for antineoplastic radiation therapy: Secondary | ICD-10-CM | POA: Diagnosis not present

## 2020-02-29 DIAGNOSIS — Z5111 Encounter for antineoplastic chemotherapy: Secondary | ICD-10-CM | POA: Diagnosis not present

## 2020-02-29 DIAGNOSIS — Z1379 Encounter for other screening for genetic and chromosomal anomalies: Secondary | ICD-10-CM | POA: Insufficient documentation

## 2020-02-29 MED ORDER — SODIUM CHLORIDE 0.9 % IV SOLN
50.0000 mg/m2 | Freq: Once | INTRAVENOUS | Status: AC
  Administered 2020-02-29: 78 mg via INTRAVENOUS
  Filled 2020-02-29: qty 13

## 2020-02-29 MED ORDER — SODIUM CHLORIDE 0.9 % IV SOLN
130.0000 mg | Freq: Once | INTRAVENOUS | Status: AC
  Administered 2020-02-29: 130 mg via INTRAVENOUS
  Filled 2020-02-29: qty 13

## 2020-02-29 MED ORDER — PALONOSETRON HCL INJECTION 0.25 MG/5ML
INTRAVENOUS | Status: AC
  Filled 2020-02-29: qty 5

## 2020-02-29 MED ORDER — DIPHENHYDRAMINE HCL 50 MG/ML IJ SOLN
25.0000 mg | Freq: Once | INTRAMUSCULAR | Status: AC
  Administered 2020-02-29: 25 mg via INTRAVENOUS

## 2020-02-29 MED ORDER — PALONOSETRON HCL INJECTION 0.25 MG/5ML
0.2500 mg | Freq: Once | INTRAVENOUS | Status: AC
  Administered 2020-02-29: 12:00:00 0.25 mg via INTRAVENOUS

## 2020-02-29 MED ORDER — SODIUM CHLORIDE 0.9 % IV SOLN
Freq: Once | INTRAVENOUS | Status: AC
  Filled 2020-02-29: qty 250

## 2020-02-29 MED ORDER — SODIUM CHLORIDE 0.9 % IV SOLN
10.0000 mg | Freq: Once | INTRAVENOUS | Status: AC
  Administered 2020-02-29: 12:00:00 10 mg via INTRAVENOUS
  Filled 2020-02-29: qty 10

## 2020-02-29 MED ORDER — FAMOTIDINE IN NACL 20-0.9 MG/50ML-% IV SOLN
INTRAVENOUS | Status: AC
  Filled 2020-02-29: qty 50

## 2020-02-29 MED ORDER — DIPHENHYDRAMINE HCL 50 MG/ML IJ SOLN
INTRAMUSCULAR | Status: AC
  Filled 2020-02-29: qty 1

## 2020-02-29 MED ORDER — FAMOTIDINE IN NACL 20-0.9 MG/50ML-% IV SOLN
20.0000 mg | Freq: Once | INTRAVENOUS | Status: AC
  Administered 2020-02-29: 13:00:00 20 mg via INTRAVENOUS

## 2020-02-29 NOTE — Patient Instructions (Signed)
[facility] Discharge Instructions for Patients Receiving Chemotherapy  Today you received the following chemotherapy agents Paclitaxel (TAXOL) & Carboplatin (PARAPLATIN).  To help prevent nausea and vomiting after your treatment, we encourage you to take your nausea medication as prescribed.  If you develop nausea and vomiting that is not controlled by your nausea medication, call the clinic.   BELOW ARE SYMPTOMS THAT SHOULD BE REPORTED IMMEDIATELY:  *FEVER GREATER THAN 100.5 F  *CHILLS WITH OR WITHOUT FEVER  NAUSEA AND VOMITING THAT IS NOT CONTROLLED WITH YOUR NAUSEA MEDICATION  *UNUSUAL SHORTNESS OF BREATH  *UNUSUAL BRUISING OR BLEEDING  TENDERNESS IN MOUTH AND THROAT WITH OR WITHOUT PRESENCE OF ULCERS  *URINARY PROBLEMS  *BOWEL PROBLEMS  UNUSUAL RASH Items with * indicate a potential emergency and should be followed up as soon as possible.  Feel free to call the clinic should you have any questions or concerns. The clinic phone number is [phone].  Please show the CHEMO ALERT CARD at check-in to the Emergency Department and triage nurse.   

## 2020-02-29 NOTE — Progress Notes (Signed)
[place] Note  Made initial pastoral visit in infusion to introduce Spiritual Care as part of Kari's support team. Brought a handmade prayer shawl and coordinating bone pillow as tangible signs of support and encouragement. Sharrie Rothman sounded upbeat even in the midst of the pain and liquid diet; knowing to anticipate those experiences helps them feel a little easier to handle. Encouraged her to utilize chaplain whenever desired for spiritual and emotional support, and will phone her husband Ron, whom I know from our Patient and [employer], to offer additional support as well.   [place], [place], [facility] Pager [phone] Voicemail [phone]

## 2020-02-29 NOTE — Progress Notes (Signed)
Verbal order from Dr. Benay Spice: Jo Morrison to treat pt with CMP from 03/29 and HR of 109

## 2020-02-29 NOTE — Progress Notes (Signed)
Nutrition follow-up completed with patient during infusion for esophageal cancer. Weight was documented as 112.3 pounds April 5 down slightly from 113.9 pounds March 29. Patient reports no new nutrition impact symptoms.  She still has some difficulty swallowing. She declines supplements.  Nutrition diagnosis: Unintended weight loss is stable.  Intervention: Provided support and encouragement for patient to increase calories and protein in small frequent meals and snacks. Discouraged weight loss. Questions answered and teach back method used  Monitoring, evaluation, goals: Patient will tolerate adequate calories and protein to minimize weight loss.  Next visit: I have encouraged patient to contact me with further questions or concerns.  **Disclaimer: This note was dictated with voice recognition software. Similar sounding words can inadvertently be transcribed and this note may contain transcription errors which may not have been corrected upon publication of note.**

## 2020-02-29 NOTE — Telephone Encounter (Signed)
Reached the patient while she was in treatment and she did not want to talk at this time.  I will try to reach her tomorrow.

## 2020-03-01 ENCOUNTER — Encounter: Payer: Self-pay | Admitting: General Practice

## 2020-03-01 ENCOUNTER — Ambulatory Visit: Payer: Self-pay | Admitting: Genetic Counselor

## 2020-03-01 ENCOUNTER — Telehealth: Payer: Self-pay | Admitting: Oncology

## 2020-03-01 ENCOUNTER — Other Ambulatory Visit: Payer: Self-pay

## 2020-03-01 ENCOUNTER — Ambulatory Visit
Admission: RE | Admit: 2020-03-01 | Discharge: 2020-03-01 | Disposition: A | Discharge status: Home / Self Care | Payer: PPO | Source: Ambulatory Visit | Attending: Radiation Oncology | Admitting: Radiation Oncology

## 2020-03-01 DIAGNOSIS — Z1379 Encounter for other screening for genetic and chromosomal anomalies: Secondary | ICD-10-CM

## 2020-03-01 DIAGNOSIS — Z510 Encounter for antineoplastic radiation therapy: Secondary | ICD-10-CM | POA: Diagnosis not present

## 2020-03-01 DIAGNOSIS — C158 Malignant neoplasm of overlapping sites of esophagus: Secondary | ICD-10-CM | POA: Diagnosis not present

## 2020-03-01 NOTE — Progress Notes (Signed)
HPI:  [name] was previously seen in the [place] clinic due to a personal and family history of cancer and concerns regarding a hereditary predisposition to cancer. Please refer to our prior cancer genetics clinic note for more information regarding our discussion, assessment and recommendations, at the time. [name] recent genetic test results were disclosed to her, as were recommendations warranted by these results. These results and recommendations are discussed in more detail below.  CANCER HISTORY:  Oncology History  Breast cancer of lower-outer quadrant of right female breast ([place])  05/29/1995 Surgery   The right breast invasive ductal carcinoma [name] [name] status post right lumpectomy with axillary lymph node dissection and chemotherapy with 4 cycles of AC, adjuvant radiation and 5 years tamoxifen   08/07/1995 - 10/07/1995 Chemotherapy   Cytoxan based chemo X 3 months   11/06/1995 - 12/19/1995 Radiation Therapy   Adjuvant XRT   07/06/1996 - 09/05/2000 Anti-estrogen oral therapy   Tamoxifen 20 mg daily x 5 years   12/13/2009 Relapse/Recurrence   Left breast lumpectomy revealed DCIS solid and cribriform types with comedonecrosis 1.1 cm [name], [name]    01/22/2010 - 02/26/2010 Radiation Therapy   Adjuvant XRT   04/05/2010 - 11/06/2015 Anti-estrogen oral therapy   Tamoxifen 20 mg daily   02/28/2020 Genetic Testing   Negative genetic testing on the common hereditary cancer panel.  The Common Hereditary Gene Panel offered by [facility] includes sequencing and/or deletion duplication testing of the following 48 genes: APC, ATM, AXIN2, BARD1, BMPR1A, BRCA1, BRCA2, BRIP1, CDH1, CDK4, CDKN2A (p14ARF), CDKN2A (p16INK4a), CHEK2, CTNNA1, DICER1, EPCAM (Deletion/duplication testing only), GREM1 (promoter region deletion/duplication testing only), KIT, MEN1, MLH1, MSH2, MSH3, MSH6, MUTYH, NBN, NF1, NHTL1, PALB2, PDGFRA, PMS2, POLD1, POLE, PTEN, RAD50, RAD51C, RAD51D,  RNF43, SDHB, SDHC, SDHD, SMAD4, SMARCA4. STK11, TP53, TSC1, TSC2, and VHL.  The following genes were evaluated for sequence changes only: SDHA and HOXB13 c.251G>A variant only. The report date is February 28, 2020.   Primary squamous cell carcinoma of overlapping sites of esophagus ([place])  01/19/2020 Initial Diagnosis   Primary squamous cell carcinoma of overlapping sites of esophagus ([place])   01/25/2020 Cancer Staging   Staging form: Esophagus - Squamous Cell Carcinoma, AJCC 8th Edition - Clinical stage from 01/25/2020: Stage Unknown (cTX, cN2, cM0, G2, L: Upper) - Signed by Jo Morrison Isaac, MD on 01/25/2020   02/01/2020 -  Chemotherapy   The patient had palonosetron (ALOXI) injection 0.25 mg, 0.25 mg, Intravenous,  Once, 1 of 1 cycle Administration: 0.25 mg (02/01/2020), 0.25 mg (02/08/2020), 0.25 mg (02/15/2020), 0.25 mg (02/22/2020), 0.25 mg (02/29/2020) CARBOplatin (PARAPLATIN) 130 mg in sodium chloride 0.9 % 100 mL chemo infusion, 130 mg (100 % of original dose 134.6 mg), Intravenous,  Once, 1 of 1 cycle Dose modification:   (original dose 134.6 mg, Cycle 1) Administration: 130 mg (02/01/2020), 130 mg (02/08/2020), 130 mg (02/15/2020), 130 mg (02/22/2020), 130 mg (02/29/2020) PACLitaxel (TAXOL) 78 mg in sodium chloride 0.9 % 250 mL chemo infusion (</= 73m/m2), 50 mg/m2 = 78 mg, Intravenous,  Once, 1 of 1 cycle Administration: 78 mg (02/01/2020), 78 mg (02/08/2020), 78 mg (02/15/2020), 78 mg (02/22/2020), 78 mg (02/29/2020)  for chemotherapy treatment.      FAMILY HISTORY:  We obtained a detailed, 4-generation family history.  Significant diagnoses are listed below: Family History  Problem Relation Age of Onset  . Stroke Father 858 . Heart disease Brother   . Colon cancer Maternal Aunt 761 .  Macular degeneration Mother   . Cancer Maternal Grandmother        unknown type  . Colon polyps Neg Hx   . Esophageal cancer Neg Hx   . Rectal cancer Neg Hx   . Stomach cancer Neg Hx     The patient does not have  children.  She has a brother and sister who are cancer free.  Both parents are deceased.  The patient's father died of a stroke at 79.  His family history is unknown   The patient's mother died from old age.  She had a brother and sister,  The sister had colon cancer.  Her parents are deceased.  The grandmother had some form of cancer.  [name] is unaware of previous family history of genetic testing for hereditary cancer risks. Patient's maternal ancestors are of [place] descent, and paternal ancestors are of [place] descent. There is no reported Ashkenazi Jewish ancestry. There is no known consanguinity.    GENETIC TEST RESULTS: Genetic testing reported out on February 28, 2020 through the Common Hereditary cancer panel found no pathogenic mutations. The Common Hereditary Gene Panel offered by [facility] includes sequencing and/or deletion duplication testing of the following 48 genes: APC, ATM, AXIN2, BARD1, BMPR1A, BRCA1, BRCA2, BRIP1, CDH1, CDK4, CDKN2A (p14ARF), CDKN2A (p16INK4a), CHEK2, CTNNA1, DICER1, EPCAM (Deletion/duplication testing only), GREM1 (promoter region deletion/duplication testing only), KIT, MEN1, MLH1, MSH2, MSH3, MSH6, MUTYH, NBN, NF1, NHTL1, PALB2, PDGFRA, PMS2, POLD1, POLE, PTEN, RAD50, RAD51C, RAD51D, RNF43, SDHB, SDHC, SDHD, SMAD4, SMARCA4. STK11, TP53, TSC1, TSC2, and VHL.  The following genes were evaluated for sequence changes only: SDHA and HOXB13 c.251G>A variant only. The test report has been scanned into EPIC and is located under the Molecular Pathology section of the Results Review tab.  A portion of the result report is included below for reference.     We discussed with [name] that because current genetic testing is not perfect, it is possible there may be a gene mutation in one of these genes that current testing cannot detect, but that chance is small.  We also discussed, that there could be another gene that has not yet been discovered, or that we have not yet  tested, that is responsible for the cancer diagnoses in the family. It is also possible there is a hereditary cause for the cancer in the family that [name] did not inherit and therefore was not identified in her testing.  Therefore, it is important to remain in touch with cancer genetics in the future so that we can continue to offer [name] the most up to date genetic testing.   ADDITIONAL GENETIC TESTING: We discussed with [name] that there are other genes that are associated with increased cancer risk that can be analyzed. Should [name] wish to pursue additional genetic testing, we are happy to discuss and coordinate this testing, at any time.    CANCER SCREENING RECOMMENDATIONS: [name] test result is considered negative (normal).  This means that we have not identified a hereditary cause for her personal and family history of cancer at this time. Most cancers happen by chance and this negative test suggests that her cancer may fall into this category.    While reassuring, this does not definitively rule out a hereditary predisposition to cancer. It is still possible that there could be genetic mutations that are undetectable by current technology. There could be genetic mutations in genes that have not been tested or identified to increase cancer  risk.  Therefore, it is recommended she continue to follow the cancer management and screening guidelines provided by her oncology and primary healthcare provider.   An individual's cancer risk and medical management are not determined by genetic test results alone. Overall cancer risk assessment incorporates additional factors, including personal medical history, family history, and any available genetic information that may result in a personalized plan for cancer prevention and surveillance  RECOMMENDATIONS FOR FAMILY MEMBERS:  Individuals in this family might be at some increased risk of developing cancer, over the general population risk,  simply due to the family history of cancer.  We recommended women in this family have a yearly mammogram beginning at age 66, or 31 years younger than the earliest onset of cancer, an annual clinical breast exam, and perform monthly breast self-exams. Women in this family should also have a gynecological exam as recommended by their primary provider. All family members should have a colonoscopy by age 21.  [name]: Lastly, we discussed with [name] that cancer genetics is a rapidly advancing field and it is possible that new genetic tests will be appropriate for her and/or her family members in the future. We encouraged her to remain in contact with cancer genetics on an annual basis so we can update her personal and family histories and let her know of advances in cancer genetics that may benefit this family.   Our contact number was provided. [name] questions were answered to her satisfaction, and she knows she is welcome to call us at anytime with additional questions or concerns.   Roma Kayser, [place], [facility] Licensed, Certified Genetic Counselor [email or web address]

## 2020-03-01 NOTE — Telephone Encounter (Signed)
Revealed negative genetic testing.  Discussed that we do not know why she has breast cancer or why there is cancer in the family. It could be due to a different gene that we are not testing, or maybe our current technology may not be able to pick something up.  It will be important for her to keep in contact with genetics to keep up with whether additional testing may be needed. 

## 2020-03-01 NOTE — Progress Notes (Signed)
[facility] Spiritual Care Note  Left caregiver support voicemail for Jo Morrison's spouse Jo Morrison as planned. Encouraged callback.   [place], [place], [facility] Pager [phone] Voicemail [phone]

## 2020-03-01 NOTE — Telephone Encounter (Signed)
Scheduled per los. Called and spoke with patient. Confirmed appt 

## 2020-03-02 ENCOUNTER — Other Ambulatory Visit: Payer: Self-pay | Admitting: Cardiothoracic Surgery

## 2020-03-02 ENCOUNTER — Ambulatory Visit: Payer: PPO | Admitting: Cardiothoracic Surgery

## 2020-03-02 ENCOUNTER — Other Ambulatory Visit: Payer: Self-pay

## 2020-03-02 ENCOUNTER — Ambulatory Visit
Admission: RE | Admit: 2020-03-02 | Discharge: 2020-03-02 | Disposition: A | Discharge status: Home / Self Care | Payer: PPO | Source: Ambulatory Visit | Attending: Radiation Oncology | Admitting: Radiation Oncology

## 2020-03-02 VITALS — BP 96/68 | HR 120 | Resp 20 | Ht 63.0 in | Wt 110.0 lb

## 2020-03-02 DIAGNOSIS — C158 Malignant neoplasm of overlapping sites of esophagus: Secondary | ICD-10-CM | POA: Diagnosis not present

## 2020-03-02 DIAGNOSIS — C154 Malignant neoplasm of middle third of esophagus: Secondary | ICD-10-CM

## 2020-03-02 DIAGNOSIS — Z510 Encounter for antineoplastic radiation therapy: Secondary | ICD-10-CM | POA: Diagnosis not present

## 2020-03-02 NOTE — Progress Notes (Signed)
[address]       [name], [address]             [phone]                    Kritika A [facility]berry [facility] Medical Record #[id number] Date of Birth: 25-Aug-1946  Referring: Hayden Pedro,* Primary Care: Hoyt Koch, MD Primary Cardiologist: No primary care provider on file.  Chief Complaint:    Chief Complaint  Patient presents with  . Esophageal Cancer    Further discuss surgery    History of Present Illness:    Jo Morrison 74 y.o. female is seen in the office  today for follow up evaluation of squamous cell carcinoma of the esophagus.  The patient notes that in September 202020 she began having some new onset of heartburn and some pain with eating.  Over-the-counter Pepcid and prescription gastric acid suppression did not help symptoms.  She noted increasing pain with eating sometimes anteriorly and sometimes radiating to the back.  Because of persistent symptoms she was referred to GI.  Dr. Carlean Purl did  upper endoscopy on 01/14/2020.  An ulcerated mass was found in the proximal esophagus and midesophagus at 26-31 cm from the incisors.  The mass was partially obstructing.  Biopsies were obtained.  The pathology revealed invasive moderately differentiated squamous cell carcinoma.  March 18, EUS was done identifying invasion depth T,  3 lymph nodes were identified in the area mid esophageal lesion and 1 gastrohepatic node positive on needle aspiration.  She is a "never" smoker.  Does drink 1 or 2 glasses of wine a day, never user of hard liquors.  Previous history is significant for breast cancer in the right breast in 1996 treated with lumpectomy radiation and chemotherapy and subsequent treatment for breast cancer left lumpectomy and radiation 2011.  Both of these malignancies were cared for in [place].  Cancer Staging Primary squamous cell carcinoma of overlapping sites of esophagus [facility]) Staging form: Esophagus - Squamous Cell Carcinoma,  AJCC 8th Edition - Clinical stage from 01/25/2020: Stage III (cT3, cN2, cM0, G2, L: Upper) - Signed by Grace Isaac, MD on 03/02/2020    Current Activity/ Functional Status:  Patient is independent with mobility/ambulation, transfers, ADL's, IADL's.   Zubrod Score: At the time of surgery this patient's most appropriate activity status/level should be described as: _0     0    Normal activity, no symptoms _1     1    Restricted in physical strenuous activity but ambulatory, able to do out light work _2     2    Ambulatory and capable of self care, unable to do work activities, up and about               >50 % of waking hours                              _3     3    Only limited self care, in bed greater than 50% of waking hours _4     4    Completely disabled, no self care, confined to bed or chair _5     5    Moribund   Past Medical History:  Diagnosis Date  . Allergy   . Anxiety   . Asthma   . Breast cancer ([place]) 2012   left  . Breast cancer, right ([place])  1996   right  . Cataract    cataracts removed  . Chronic uveitis of right eye   . Colon polyps   . Corneal edema of right eye   . Cystoid macular edema of right eye   . Depressive disorder   . DVT (deep venous thrombosis) ([place])   . Family history of colon cancer   . Glaucoma    not on eye drops at this time.  . Hypertension   . Hypertensive retinopathy of both eyes   . Olecranon bursitis   . Osteoarthritis   . Other disorders of bone and cartilage(733.99)   . Pseudoexfoliation (PXF) glaucoma of both eyes   . Pseudophakia, both eyes   . Seasonal allergies   . Unspecified pruritic disorder     Past Surgical History:  Procedure Laterality Date  . APPENDECTOMY  1958  . BREAST LUMPECTOMY Right 1996   Dr. Corena Herter in [place]  . BREAST LUMPECTOMY Left 2011   Dr. Pleas Patricia  . cataract Bilateral   . CATARACT EXTRACTION Bilateral    Dr. Lucita Ferrara  . COLONOSCOPY  2011-last  . DUPUYTREN CONTRACTURE RELEASE    .  ESOPHAGOGASTRODUODENOSCOPY (EGD) WITH PROPOFOL N/A 02/10/2020   Procedure: ESOPHAGOGASTRODUODENOSCOPY (EGD) WITH PROPOFOL;  Surgeon: Milus Banister, MD;  Location: [facility] END[facility]COPY;  Service: Endoscopy;  Laterality: N/A;  . FINE NEEDLE ASPIRATION N/A 02/10/2020   Procedure: FINE NEEDLE ASPIRATION (FNA) LINEAR;  Surgeon: Milus Banister, MD;  Location: [facility] ENDOSCOPY;  Service: Endoscopy;  Laterality: N/A;  . HAND SURGERY Right 2020  . LASIK Bilateral 2007  . POLYPECTOMY    . ROTATOR CUFF REPAIR  2007  . TONSILLECTOMY  1967  . UPPER ESOPHAGEAL ENDOSCOPIC ULTRASOUND (EUS) N/A 02/10/2020   Procedure: UPPER ESOPHAGEAL ENDOSCOPIC ULTRASOUND (EUS);  Surgeon: Milus Banister, MD;  Location: [facility];  Service: Endoscopy;  Laterality: N/A;    Family History  Problem Relation Age of Onset  . Stroke Father 60  . Heart disease Brother   . Colon cancer Maternal Aunt 69  . Macular degeneration Mother   . Cancer Maternal Grandmother        unknown type  . Colon polyps Neg Hx   . Esophageal cancer Neg Hx   . Rectal cancer Neg Hx   . Stomach cancer Neg Hx      Social History   Tobacco Use  Smoking Status Never Smoker  Smokeless Tobacco Never Used    Social History   Substance and Sexual Activity  Alcohol Use Yes  . Alcohol/week: 25.0 standard drinks  . Types: 25 Glasses of wine per week     Allergies  Allergen Reactions  . Latex Shortness Of Breath    Current Outpatient Medications  Medication Sig Dispense Refill  . Albuterol Sulfate (PROAIR RESPICLICK) 161 (90 BASE) MCG/ACT AEPB Inhale 2 puffs into the lungs every 4 (four) hours as needed (SOB). 1 each 0  . Bromfenac Sodium (PROLENSA) 0.07 % SOLN Place 1 drop into the right eye 4 (four) times daily. (Patient taking differently: Place 1 drop into the right eye daily in the afternoon. ) 6 mL 3  . budesonide (PULMICORT) 180 MCG/ACT inhaler Inhale 2 puffs into the lungs daily as needed (Shortness of breath).     .  chlorpheniramine-HYDROcodone (TUSSIONEX) 10-8 MG/5ML SUER Take 5 mLs by mouth every 12 (twelve) hours as needed for cough. 140 mL 0  . clonazePAM (KLONOPIN) 0.5 MG tablet Take 1 tablet (0.5 mg total) by mouth at  bedtime as needed. 30 tablet 0  . dorzolamide-timolol (COSOPT) 22.3-6.8 MG/ML ophthalmic solution Place 1 drop into the right eye 2 (two) times daily.     . fluticasone (FLONASE) 50 MCG/ACT nasal spray Place 2 sprays into the nose daily as needed for allergies.     Marland Kitchen gabapentin (NEURONTIN) 100 MG capsule Take 100 mg by mouth at bedtime.   3  . hydrochlorothiazide (HYDRODIURIL) 25 MG tablet TAKE 1 TABLET(25 MG) BY MOUTH DAILY 90 tablet 3  . loteprednol (LOTEMAX) 0.5 % ophthalmic suspension Place 1 drop into the right eye 4 (four) times daily.    . methocarbamol (ROBAXIN) 500 MG tablet Take 1 tablet (500 mg total) by mouth every 8 (eight) hours as needed for muscle spasms. 60 tablet 0  . montelukast (SINGULAIR) 10 MG tablet TAKE 1 TABLET(10 MG) BY MOUTH AT BEDTIME (Patient taking differently: Take 10 mg by mouth at bedtime. ) 90 tablet 1  . ondansetron (ZOFRAN) 8 MG tablet Take 1 tablet (8 mg total) by mouth every 8 (eight) hours as needed for nausea or vomiting. 30 tablet 1  . oxycodone (OXY-IR) 5 MG capsule Take 5 mg by mouth at bedtime as needed for pain.    . pantoprazole (PROTONIX) 40 MG tablet Take 40 mg by mouth daily.    . prochlorperazine (COMPAZINE) 10 MG tablet Take 1 tablet (10 mg total) by mouth every 6 (six) hours as needed. 30 tablet 1  . REFRESH OPTIVE ADVANCED 0.5-1-0.5 % SOLN Place 1 drop into both eyes daily as needed (itching Eyes).     . sodium chloride (MURO 128) 2 % ophthalmic solution Place 1 drop into the right eye 4 (four) times daily.    . sodium chloride (MURO 128) 5 % ophthalmic ointment Place 1 application into the right eye at bedtime.    . sucralfate (CARAFATE) 1 g tablet Take 1 tablet (1 g total) by mouth 4 (four) times daily. Dissolve each tablet in 15 cc water  before use. 120 tablet 2  . traZODone (DESYREL) 100 MG tablet TAKE 1 TO 2 TABLETS(100 TO 200 MG) BY MOUTH AT BEDTIME AS NEEDED FOR SLEEP (Patient taking differently: Take 150 mg by mouth at bedtime. ) 180 tablet 3  . triamcinolone cream (KENALOG) 0.1 % Apply 1 application topically 2 (two) times daily. 100 g 0  . valACYclovir (VALTREX) 1000 MG tablet Take 1,000 mg by mouth daily.     Marland Kitchen venlafaxine XR (EFFEXOR-XR) 150 MG 24 hr capsule Take 1 capsule (150 mg total) by mouth every morning. (Patient taking differently: Take 150 mg by mouth daily. ) 90 capsule 3  . b complex vitamins capsule Take 1 capsule by mouth daily. (Patient not taking: Reported on 03/02/2020) 30 capsule 11  . Cholecalciferol (VITAMIN D) 50 MCG (2000 UT) CAPS Take 1 capsule (2,000 Units total) by mouth daily. (Patient not taking: Reported on 03/02/2020) 30 capsule 11  . Multiple Vitamins-Minerals (MULTIPLE VITAMINS/WOMENS) tablet Take 1 tablet by mouth daily. (Patient not taking: Reported on 03/02/2020) 30 tablet 11   No current facility-administered medications for this visit.      Review of Systems:     Cardiac Review of Systems: [Y] = yes  or   [ N ] = no   Chest Pain [  y  ]  Resting SOB [  n ] Exertional SOB  [n  ]  Orthopnea [ n ]   Pedal Edema [ n  ]    Palpitations [n  ]  Syncope  [ n ]   Presyncope [  n ]   General Review of Systems: [Y] = yes [  ]=no Constitional: recent weight change Blue.Reese  ];  Wt loss over the last 3 months [ 129-119   ] anorexia [  ]; fatigue [  ]; nausea [  ]; night sweats [  ]; fever [  ]; or chills [  ];           Eye : blurred vision [  ]; diplopia [   ]; vision changes [  ];  Amaurosis fugax[  ]; Resp: cough [  ];  wheezing[  ];  hemoptysis[  ]; shortness of breath[  ]; paroxysmal nocturnal dyspnea[  ]; dyspnea on exertion[  ]; or orthopnea[  ];  GI:  gallstones[  ], vomiting[  ];  dysphagia[y ]; melena[  ];  hematochezia [  ]; heartburn[ y ];   Hx of  Colonoscopy[  ]; GU: kidney stones [  ];  hematuria[  ];   dysuria [  ];  nocturia[  ];  history of     obstruction [  ]; urinary frequency [  ]             Skin: rash, swelling[  ];, hair loss[  ];  peripheral edema[  ];  or itching[  ]; Musculosketetal: myalgias[  ];  joint swelling[  ];  joint erythema[  ];  joint pain[  ];  back pain[  ]; recent hand surgery   Heme/Lymph: bruising[  ];  bleeding[  ];  anemia[  ];  Neuro: TIA[  ];  headaches[  ];  stroke[  ];  vertigo[  ];  seizures[  ];   paresthesias[  ];  difficulty walking[  ];  Psych:depression[y  ]; anxiety[ y ];  Endocrine: diabetes[  ];  thyroid dysfunction[  ];  Immunizations: Flu up to date Blue.Reese  ]; Pneumococcal up to date [ y ];  Other:  Wt Readings from Last 3 Encounters:  03/02/20 110 lb (49.9 kg)  02/28/20 112 lb 4.8 oz (50.9 kg)  02/21/20 113 lb 14.4 oz (51.7 kg)   ENDOSCOPIC FINDING: Findings: 1. Ulcerated, friable mass in the mid esophagus already proven to be squamous cell carcinoma on previous biopsies. This mass is non circumferential, 5-6cm long with proximal edge located 25cm from the incisors. 2. Otherwise normal examination to the distal stomach. ENDOSONOGRAPHIC FINDING: 1. The mid esophagus mass described above correlates with a hypoechoic mass that clearly passes into and through the muscularis propria layer of the esophageal wall (uT3). 2. Three round, suspicious lymphnodes abut the esophagus very near to the primary tumor. 3. One malignant-appearing lymph node was visualized in the gastrohepatic ligament (level 18). It measured 12 mm by 11 mm in maximal cross-sectional diameter. The node was round, hypoechoic and had well defined margins. Fine needle aspiration for cytology was performed. Color Doppler imaging was utilized prior to needle puncture to confirm a lack of significant vascular structures within the needle path. Two passes were made with the 25 gaugpresent to evaluate the adequacy of the specimen.  - Squamous cell cancer of the mid  esophagus with suspicious local (paraesophageal) adenopathy. PET avid gastrohepatic ligament LN was located and sampled by transgastric EUS guided FNA. Preliminary cytology was positive for metastatic squamous cell to the gastrohepatic lymphnode. needle using a transgastric approach.   Clinical History: Esophageal cancer staging  Specimen Submitted: A. LYMPH NODE, GASTRO-HEPATIC LIGAMENT, FINE NEEDLE  ASPIRATION:  FINAL MICROSCOPIC DIAGNOSIS:  - Malignant cells consistent with metastatic squamous cell carcinoma   SPECIMEN ADEQUACY:  Satisfactory for evaluation   IMMEDIATE EVALUATION:  ADEQUATE ([facility])    PHYSICAL EXAMINATION: BP 96/68 (BP Location: Left Arm)   Pulse (!) 120 Comment: regular  Resp 20   Ht _0  (1.6 m)   Wt 110 lb (49.9 kg)   SpO2 98% Comment: RA  BMI 19.49 kg/m  General appearance: alert, cachectic and no distress Head: Normocephalic, without obvious abnormality, atraumatic Neck: no adenopathy, no carotid bruit, no JVD, supple, symmetrical, trachea midline and thyroid not enlarged, symmetric, no tenderness/mass/nodules Lymph nodes: Cervical, supraclavicular, and axillary nodes normal. Resp: clear to auscultation bilaterally Cardio: regular rate and rhythm, S1, S2 normal, no murmur, click, rub or gallop GI: soft, non-tender; bowel sounds normal; no masses,  no organomegaly Extremities: extremities normal, atraumatic, no cyanosis or edema and Homans sign is negative, no sign of DVT Neurologic: Grossly normal  Diagnostic Studies & Laboratory data:     Recent Radiology Findings:     Recent Lab Findings: Lab Results  Component Value Date   WBC 3.1 (L) 02/28/2020   HGB 12.0 02/28/2020   HCT 36.0 02/28/2020   PLT 186 02/28/2020   GLUCOSE 146 (H) 02/21/2020   CHOL 179 09/24/2019   TRIG 102.0 09/24/2019   HDL 61.90 09/24/2019   LDLCALC 96 09/24/2019   ALT 12 02/21/2020   AST 16 02/21/2020   NA 141 02/21/2020   K 3.5 02/21/2020   CL 106 02/21/2020     CREATININE 0.71 02/21/2020   BUN 10 02/21/2020   CO2 28 02/21/2020   TSH 2.920 06/09/2014   - A large, ulcerating mass was found in the proximal esophagus and in the mid esophagus, 26 to 31 cm from the incisors. The mass was partially obstructing and partially circumferential (involving two thirds of the lumen circumference). Biopsies were taken with a cold forceps for histology. Verification of patient identification for the specimen was done. Estimated blood loss was minimal.   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 ,  clinical T3 , N2, M0 Stage IIIb  I discussed with the patient and her husband the place for surgical resection of the esophagus in the setting of her advanced disease but without evidence of distant metastasis.  Plan to see the patient back in approximately 4 weeks with a follow-up restaging CT scan of the chest abdomen pelvis.  Patient was interested in a second opinion at [facility] or [facility].  Patient is concerned about surgery due to her age and fragility.   Cancer Staging Primary squamous cell carcinoma of overlapping sites of esophagus [facility]) Staging form: Esophagus - Squamous Cell Carcinoma, AJCC 8th Edition - Clinical stage from 01/25/2020: Stage III (cT3, cN2, cM0, G2, L: Upper) - Signed by Grace Isaac, MD on 03/02/2020  2/Right breast cancer July 1996, [name], [name], right lumpectomy and axillary dissection followed by adjuvant AC chemotherapy-4 cycles, 5 years of tamoxifen, and breast radiation 3/Left breast DCIS January 2011 -1.1 cm, [name], [name], left breast lumpectomy followed by adjuvant radiation and 5 years of tamoxifen   4/history of hypertension   5/history of asthma  Grace Isaac MD      [address] ,[place] [address] Office [phone]     03/03/2020 1:06 PM

## 2020-03-03 ENCOUNTER — Ambulatory Visit
Admission: RE | Admit: 2020-03-03 | Discharge: 2020-03-03 | Disposition: A | Discharge status: Home / Self Care | Payer: PPO | Source: Ambulatory Visit | Attending: Radiation Oncology | Admitting: Radiation Oncology

## 2020-03-03 ENCOUNTER — Other Ambulatory Visit: Payer: Self-pay

## 2020-03-03 DIAGNOSIS — C158 Malignant neoplasm of overlapping sites of esophagus: Secondary | ICD-10-CM | POA: Diagnosis not present

## 2020-03-03 DIAGNOSIS — Z510 Encounter for antineoplastic radiation therapy: Secondary | ICD-10-CM | POA: Diagnosis not present

## 2020-03-06 ENCOUNTER — Other Ambulatory Visit: Payer: Self-pay

## 2020-03-06 ENCOUNTER — Ambulatory Visit
Admission: RE | Admit: 2020-03-06 | Discharge: 2020-03-06 | Disposition: A | Discharge status: Home / Self Care | Payer: PPO | Source: Ambulatory Visit | Attending: Radiation Oncology | Admitting: Radiation Oncology

## 2020-03-06 DIAGNOSIS — Z510 Encounter for antineoplastic radiation therapy: Secondary | ICD-10-CM | POA: Diagnosis not present

## 2020-03-06 DIAGNOSIS — C158 Malignant neoplasm of overlapping sites of esophagus: Secondary | ICD-10-CM | POA: Diagnosis not present

## 2020-03-07 ENCOUNTER — Encounter: Payer: Self-pay | Admitting: General Practice

## 2020-03-07 ENCOUNTER — Other Ambulatory Visit: Payer: Self-pay

## 2020-03-07 ENCOUNTER — Ambulatory Visit
Admission: RE | Admit: 2020-03-07 | Discharge: 2020-03-07 | Disposition: A | Discharge status: Home / Self Care | Payer: PPO | Source: Ambulatory Visit | Attending: Radiation Oncology | Admitting: Radiation Oncology

## 2020-03-07 DIAGNOSIS — Z510 Encounter for antineoplastic radiation therapy: Secondary | ICD-10-CM | POA: Diagnosis not present

## 2020-03-07 DIAGNOSIS — C158 Malignant neoplasm of overlapping sites of esophagus: Secondary | ICD-10-CM | POA: Diagnosis not present

## 2020-03-07 NOTE — Progress Notes (Signed)
[employer] Spiritual Care Note  Received return voicemail from spouse Jo Morrison (to whom I had reached out for caregiver support) and mailed a handwritten note of encouragement to Jo Morrison and Jo Morrison together.   [place], [place], [facility] Pager [phone] Voicemail [phone]

## 2020-03-08 ENCOUNTER — Other Ambulatory Visit: Payer: Self-pay

## 2020-03-08 ENCOUNTER — Ambulatory Visit
Admission: RE | Admit: 2020-03-08 | Discharge: 2020-03-08 | Disposition: A | Discharge status: Home / Self Care | Payer: PPO | Source: Ambulatory Visit | Attending: Radiation Oncology | Admitting: Radiation Oncology

## 2020-03-08 DIAGNOSIS — C158 Malignant neoplasm of overlapping sites of esophagus: Secondary | ICD-10-CM | POA: Diagnosis not present

## 2020-03-08 DIAGNOSIS — Z510 Encounter for antineoplastic radiation therapy: Secondary | ICD-10-CM | POA: Diagnosis not present

## 2020-03-11 ENCOUNTER — Encounter: Payer: Self-pay | Admitting: Oncology

## 2020-03-13 ENCOUNTER — Other Ambulatory Visit: Payer: Self-pay | Admitting: *Deleted

## 2020-03-13 ENCOUNTER — Other Ambulatory Visit: Payer: Self-pay

## 2020-03-13 ENCOUNTER — Inpatient Hospital Stay: Payer: PPO

## 2020-03-13 ENCOUNTER — Telehealth: Payer: Self-pay | Admitting: Oncology

## 2020-03-13 ENCOUNTER — Inpatient Hospital Stay (HOSPITAL_BASED_OUTPATIENT_CLINIC_OR_DEPARTMENT_OTHER): Payer: PPO | Admitting: Oncology

## 2020-03-13 VITALS — BP 135/89 | HR 122 | Temp 98.0°F | Resp 17 | Wt 106.2 lb

## 2020-03-13 DIAGNOSIS — E860 Dehydration: Secondary | ICD-10-CM

## 2020-03-13 DIAGNOSIS — C158 Malignant neoplasm of overlapping sites of esophagus: Secondary | ICD-10-CM | POA: Diagnosis not present

## 2020-03-13 DIAGNOSIS — Z5111 Encounter for antineoplastic chemotherapy: Secondary | ICD-10-CM | POA: Diagnosis not present

## 2020-03-13 LAB — CBC WITH DIFFERENTIAL (CANCER CENTER ONLY)
Abs Immature Granulocytes: 0.03 10*3/uL (ref 0.00–0.07)
Basophils Absolute: 0 10*3/uL (ref 0.0–0.1)
Basophils Relative: 0 %
Eosinophils Absolute: 0 10*3/uL (ref 0.0–0.5)
Eosinophils Relative: 1 %
HCT: 38.9 % (ref 36.0–46.0)
Hemoglobin: 13.2 g/dL (ref 12.0–15.0)
Immature Granulocytes: 1 %
Lymphocytes Relative: 10 %
Lymphs Abs: 0.3 10*3/uL — ABNORMAL LOW (ref 0.7–4.0)
MCH: 32.3 pg (ref 26.0–34.0)
MCHC: 33.9 g/dL (ref 30.0–36.0)
MCV: 95.1 fL (ref 80.0–100.0)
Monocytes Absolute: 0.8 10*3/uL (ref 0.1–1.0)
Monocytes Relative: 27 %
Neutro Abs: 1.9 10*3/uL (ref 1.7–7.7)
Neutrophils Relative %: 61 %
Platelet Count: 193 10*3/uL (ref 150–400)
RBC: 4.09 MIL/uL (ref 3.87–5.11)
RDW: 13.2 % (ref 11.5–15.5)
WBC Count: 3.1 10*3/uL — ABNORMAL LOW (ref 4.0–10.5)
nRBC: 0 % (ref 0.0–0.2)

## 2020-03-13 MED ORDER — HYDROCOD POLST-CPM POLST ER 10-8 MG/5ML PO SUER: 5.0000 mL | Freq: Two times a day (BID) | ORAL | 0 refills | Status: DC | PRN

## 2020-03-13 MED ORDER — SODIUM CHLORIDE 0.9 % IV SOLN
INTRAVENOUS | Status: DC
  Filled 2020-03-13 (×2): qty 250

## 2020-03-13 NOTE — Telephone Encounter (Signed)
Scheduled per 04/19 los, patient received calender.

## 2020-03-13 NOTE — Progress Notes (Signed)
[facility] OFFICE PROGRESS NOTE   Diagnosis: Esophagus cancer  INTERVAL HISTORY:   Jo Morrison completed radiation 03/08/2020.  The last cycle of chemotherapy was on 02/29/2020.  She reports malaise.  The baseline chest/back pain have improved.  She has odynophagia.  She is not taking pain medication.  The odynophagia is partially helped by Carafate.  She is using hydrocodone for cough.  She reports asthma symptoms are under good control. She continues liquid nutrition supplements.  She feels that she is drinking an adequate amount of liquid.  She reports feeling "dizzy "when standing at times.  Objective:  Vital signs in last 24 hours:  Blood pressure 135/89, pulse (!) 122, temperature 98 F (36.7 C), temperature source Temporal, resp. rate 17, weight 106 lb 3.2 oz (48.2 kg), SpO2 100 %.    HEENT: No thrush or ulcers Resp: Lungs clear bilaterally Cardio: Regular rate and rhythm GI: No hepatomegaly, nontender Vascular: No leg edema, diminished skin turgor  Skin: Mild radiation rash at the upper chest and back    Lab Results:  Lab Results  Component Value Date   WBC 3.1 (L) 03/13/2020   HGB 13.2 03/13/2020   HCT 38.9 03/13/2020   MCV 95.1 03/13/2020   PLT 193 03/13/2020   NEUTROABS 1.9 03/13/2020    CMP  Lab Results  Component Value Date   NA 141 02/21/2020   K 3.5 02/21/2020   CL 106 02/21/2020   CO2 28 02/21/2020   GLUCOSE 146 (H) 02/21/2020   BUN 10 02/21/2020   CREATININE 0.71 02/21/2020   CALCIUM 8.9 02/21/2020   PROT 6.4 (L) 02/21/2020   ALBUMIN 3.4 (L) 02/21/2020   AST 16 02/21/2020   ALT 12 02/21/2020   ALKPHOS 44 02/21/2020   BILITOT 0.8 02/21/2020   GFRNONAA >60 02/21/2020   GFRAA >60 02/21/2020     Medications: I have reviewed the patient's current medications.   Assessment/Plan:  1. 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 Upper endoscopy 01/14/2020-ulcerated mass in the proximal/mid esophagus at  26-31 cm from the incisors, biopsy confirmed invasive moderately differentiated squamous cell carcinoma ? CT abdomen/pelvis 12/09/2019-uterine fibroids ? CT chest 12/30/2019-normal esophagus, new sclerosis of the medial head of the right clavicle likely degenerative ? PET scan 01/24/2020-long segment of hypermetabolic thickening of the distal esophagus.  For hypermetabolic metastatic lymph nodes, 3 in the mediastinum and 1 in the upper abdomen gastrohepatic ligament.  ? EUS 02/10/2020-ulcerated mass in the mid esophagus,uT3, 3 suspicious lymph nodes near the primary tumor, malignant appearing lymph node at the gastrohepatic ligament (level 18)-metastatic squamous cell carcinoma ? Radiation 01/31/2020 ? Cycle 1 weekly Taxol/carboplatin 02/01/2020 ? Cycle 2 weekly Taxol/carboplatin 02/08/2020 ? Cycle 3 weekly Taxol/carboplatin 02/15/2020 ? Cycle 4 weekly Taxol/carboplatin 02/22/2020 ? Cycle 5 weekly Taxol/carboplatin 02/29/2020 ? Radiation completed 03/08/2020 2. 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  Odynophagia secondary to radiation  Disposition:  JoRichter has completed the course of chemotherapy/radiation.  She continues to have odynophagia.  We recommended she push fluids as tolerated.  Hopefully her symptoms will improve over the next week.  She is losing weight and  I suspect she is dehydrated.  She will receive IV fluids today and again later this week.  She will return for an office visit in 1 week.  Ms. Capasso saw Dr. Servando Snare.  He will consider an esophagectomy depending on her  clinical status and a restaging evaluation.  She is scheduled for restaging CTs and an office visit next month.  Ms. Blatchford would like a second multidisciplinary opinion.  I will make a referral to Dr. Fanny Skates to discuss the indication for surgery and immunotherapy.  Betsy Coder, MD  03/13/2020  1:38 PM

## 2020-03-15 ENCOUNTER — Other Ambulatory Visit: Payer: Self-pay

## 2020-03-15 ENCOUNTER — Inpatient Hospital Stay: Payer: PPO

## 2020-03-15 VITALS — BP 151/81 | HR 92 | Temp 98.3°F | Resp 18

## 2020-03-15 DIAGNOSIS — Z5111 Encounter for antineoplastic chemotherapy: Secondary | ICD-10-CM | POA: Diagnosis not present

## 2020-03-15 DIAGNOSIS — E860 Dehydration: Secondary | ICD-10-CM

## 2020-03-15 MED ORDER — SODIUM CHLORIDE 0.9 % IV SOLN
Freq: Once | INTRAVENOUS | Status: AC
  Filled 2020-03-15: qty 250

## 2020-03-15 NOTE — Patient Instructions (Signed)
Dehydration, Adult Dehydration is a condition in which there is not enough water or other fluids in the body. This happens when a person loses more fluids than he or she takes in. Important organs, such as the kidneys, brain, and heart, cannot function without a proper amount of fluids. Any loss of fluids from the body can lead to dehydration. Dehydration can be mild, moderate, or severe. It should be treated right away to prevent it from becoming severe. What are the causes? Dehydration may be caused by:  Conditions that cause loss of water or other fluids, such as diarrhea, vomiting, or sweating or urinating a lot.  Not drinking enough fluids, especially when you are ill or doing activities that require a lot of energy.  Other illnesses and conditions, such as fever or infection.  Certain medicines, such as medicines that remove excess fluid from the body (diuretics).  Lack of safe drinking water.  Not being able to get enough water and food. What increases the risk? The following factors may make you more likely to develop this condition:  Having a long-term (chronic) illness that has not been treated properly, such as diabetes, heart disease, or kidney disease.  Being 65 years of age or older.  Having a disability.  Living in a place that is high in altitude, where thinner, drier air causes more fluid loss.  Doing exercises that put stress on your body for a long time (endurance sports). What are the signs or symptoms? Symptoms of dehydration depend on how severe it is. Mild or moderate dehydration  Thirst.  Dry lips or dry mouth.  Dizziness or light-headedness, especially when standing up from a seated position.  Muscle cramps.  Dark urine. Urine may be the color of tea.  Less urine or tears produced than usual.  Headache. Severe dehydration  Changes in skin. Your skin may be cold and clammy, blotchy, or pale. Your skin also may not return to normal after being  lightly pinched and released.  Little or no tears, urine, or sweat.  Changes in vital signs, such as rapid breathing and low blood pressure. Your pulse may be weak or may be faster than 100 beats a minute when you are sitting still.  Other changes, such as: ? Feeling very thirsty. ? Sunken eyes. ? Cold hands and feet. ? Confusion. ? Being very tired (lethargic) or having trouble waking from sleep. ? Short-term weight loss. ? Loss of consciousness. How is this diagnosed? This condition is diagnosed based on your symptoms and a physical exam. You may have blood and urine tests to help confirm the diagnosis. How is this treated? Treatment for this condition depends on how severe it is. Treatment should be started right away. Do not wait until dehydration becomes severe. Severe dehydration is an emergency and needs to be treated in a hospital.  Mild or moderate dehydration can be treated at home. You may be asked to: ? Drink more fluids. ? Drink an oral rehydration solution (ORS). This drink helps restore proper amounts of fluids and salts and minerals in the blood (electrolytes).  Severe dehydration can be treated: ? With IV fluids. ? By correcting abnormal levels of electrolytes. This is often done by giving electrolytes through a tube that is passed through your nose and into your stomach (nasogastric tube, or NG tube). ? By treating the underlying cause of dehydration. Follow these instructions at home: Oral rehydration solution If told by your health care provider, drink an ORS:  Make   an ORS by following instructions on the package.  Start by drinking small amounts, about  cup (120 mL) every 5-10 minutes.  Slowly increase how much you drink until you have taken the amount recommended by your health care provider. Eating and drinking         Drink enough clear fluid to keep your urine pale yellow. If you were told to drink an ORS, finish the ORS first and then start slowly  drinking other clear fluids. Drink fluids such as: ? Water. Do not drink only water. Doing that can lead to hyponatremia, which is having too little salt (sodium) in the body. ? Water from ice chips you suck on. ? Fruit juice that you have added water to (diluted fruit juice). ? Low-calorie sports drinks.  Eat foods that contain a healthy balance of electrolytes, such as bananas, oranges, potatoes, tomatoes, and spinach.  Do not drink alcohol.  Avoid the following: ? Drinks that contain a lot of sugar. These include high-calorie sports drinks, fruit juice that is not diluted, and soda. ? Caffeine. ? Foods that are greasy or contain a lot of fat or sugar. General instructions  Take over-the-counter and prescription medicines only as told by your health care provider.  Do not take sodium tablets. Doing that can lead to having too much sodium in the body (hypernatremia).  Return to your normal activities as told by your health care provider. Ask your health care provider what activities are safe for you.  Keep all follow-up visits as told by your health care provider. This is important. Contact a health care provider if:  You have muscle cramps, pain, or discomfort, such as: ? Pain in your abdomen and the pain gets worse or stays in one area (localizes). ? Stiff neck.  You have a rash.  You are more irritable than usual.  You are sleepier or have a harder time waking than usual.  You feel weak or dizzy.  You feel very thirsty. Get help right away if you have:  Any symptoms of severe dehydration.  Symptoms of vomiting, such as: ? You cannot eat or drink without vomiting. ? Vomiting gets worse or does not go away. ? Vomit includes blood or green matter (bile).  Symptoms that get worse with treatment.  A fever.  A severe headache.  Problems with urination or bowel movements, such as: ? Diarrhea that gets worse or does not go away. ? Blood in your stool (feces). This  may cause stool to look black and tarry. ? Not urinating, or urinating only a small amount of very dark urine, within 6-8 hours.  Trouble breathing. These symptoms may represent a serious problem that is an emergency. Do not wait to see if the symptoms will go away. Get medical help right away. Call your local emergency services (911 in the [place] Do not drive yourself to the hospital. Summary  Dehydration is a condition in which there is not enough water or other fluids in the body. This happens when a person loses more fluids than he or she takes in.  Treatment for this condition depends on how severe it is. Treatment should be started right away. Do not wait until dehydration becomes severe.  Drink enough clear fluid to keep your urine pale yellow. If you were told to drink an oral rehydration solution (ORS), finish the ORS first and then start slowly drinking other clear fluids.  Take over-the-counter and prescription medicines only as told by your health care   provider.  Get help right away if you have any symptoms of severe dehydration. This information is not intended to replace advice given to you by your health care provider. Make sure you discuss any questions you have with your health care provider. Document Revised: 06/24/2019 Document Reviewed: 06/24/2019 Elsevier Patient Education  2020 [employer]   

## 2020-03-17 ENCOUNTER — Other Ambulatory Visit: Payer: Self-pay

## 2020-03-17 ENCOUNTER — Inpatient Hospital Stay: Payer: PPO | Admitting: Nutrition

## 2020-03-17 ENCOUNTER — Inpatient Hospital Stay: Payer: PPO

## 2020-03-17 VITALS — BP 120/80 | HR 91 | Temp 98.2°F | Resp 18

## 2020-03-17 DIAGNOSIS — Z5111 Encounter for antineoplastic chemotherapy: Secondary | ICD-10-CM | POA: Diagnosis not present

## 2020-03-17 DIAGNOSIS — E860 Dehydration: Secondary | ICD-10-CM

## 2020-03-17 MED ORDER — SODIUM CHLORIDE 0.9 % IV SOLN
Freq: Once | INTRAVENOUS | Status: AC
  Filled 2020-03-17: qty 250

## 2020-03-17 NOTE — Progress Notes (Signed)
Nutrition follow-up completed with patient during IV fluids.  She has completed treatment for esophageal cancer. Weight is decreased and was documented as 106.2 pounds April 19.  This is decreased from 112.3 pounds April 5. Patient reports she continues to experience odynophagia. She continues to take small sips of liquids and drinks throughout the day.  She is trying to increase fluids. She is drinking 1 protein shake a day.  She is hopeful nutrition impact symptoms will improve soon.  Nutrition diagnosis: Unintended weight loss continues.  Intervention: Enforced importance of continuing adequate hydration and working to consume soft solids. Provided support and encouragement.  Monitoring, evaluation, goals: Patient will tolerate adequate calories protein and fluids.  Next visit: Patient will contact me with questions or concerns.  **Disclaimer: This note was dictated with voice recognition software. Similar sounding words can inadvertently be transcribed and this note may contain transcription errors which may not have been corrected upon publication of note.**

## 2020-03-17 NOTE — Patient Instructions (Signed)

## 2020-03-20 ENCOUNTER — Inpatient Hospital Stay (HOSPITAL_BASED_OUTPATIENT_CLINIC_OR_DEPARTMENT_OTHER): Payer: PPO | Admitting: Nurse Practitioner

## 2020-03-20 ENCOUNTER — Encounter: Payer: Self-pay | Admitting: Nurse Practitioner

## 2020-03-20 ENCOUNTER — Other Ambulatory Visit: Payer: Self-pay

## 2020-03-20 ENCOUNTER — Telehealth: Payer: Self-pay | Admitting: Oncology

## 2020-03-20 VITALS — BP 126/86 | HR 112 | Temp 98.2°F | Resp 18 | Ht 63.0 in | Wt 103.9 lb

## 2020-03-20 DIAGNOSIS — C158 Malignant neoplasm of overlapping sites of esophagus: Secondary | ICD-10-CM

## 2020-03-20 DIAGNOSIS — Z5111 Encounter for antineoplastic chemotherapy: Secondary | ICD-10-CM | POA: Diagnosis not present

## 2020-03-20 NOTE — Progress Notes (Addendum)
  [place] OFFICE PROGRESS NOTE   Diagnosis: Esophagus cancer  INTERVAL HISTORY:   Jo Morrison returns as scheduled.  She continues to have dysphagia/odynophagia.  She is able to tolerate small sips of liquids.  She estimates 1 nutritional supplement a day.  She is able to swallow medication in capsule form.  More difficulty with tablets.  She is taking extra strength Tylenol as needed.  She has oxycodone if needed.  Objective:  Vital signs in last 24 hours:  Blood pressure 126/86, pulse (!) 112, temperature 98.2 F (36.8 C), temperature source Temporal, resp. rate 18, height _0  (1.6 m), weight 103 lb 14.4 oz (47.1 kg), SpO2 100 %.    HEENT: White coating over tongue.  No buccal thrush. Resp: Lungs clear bilaterally. Cardio: Regular rate and rhythm. GI: Abdomen soft and nontender.  No hepatomegaly. Vascular: No leg edema. Skin: Mild decrease in skin turgor.  Radiation hyperpigmentation upper chest and back.   Lab Results:  Lab Results  Component Value Date   WBC 3.1 (L) 03/13/2020   HGB 13.2 03/13/2020   HCT 38.9 03/13/2020   MCV 95.1 03/13/2020   PLT 193 03/13/2020   NEUTROABS 1.9 03/13/2020    Imaging:  No results found.  Medications: I have reviewed the patient's current medications.  Assessment/Plan:  1. 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 Upper endoscopy 01/14/2020-ulcerated mass in the proximal/mid esophagus at 26-31 cm from the incisors, biopsy confirmed invasive moderately differentiated squamous cell carcinoma ? CT abdomen/pelvis 12/09/2019-uterine fibroids ? CT chest 12/30/2019-normal esophagus, new sclerosis of the medial head of the right clavicle likely degenerative ? PET scan 01/24/2020-long segment of hypermetabolic thickening of the distal esophagus. For hypermetabolic metastatic lymph nodes, 3 in the mediastinum and 1 in the upper abdomen gastrohepatic ligament. ? EUS 02/10/2020-ulcerated mass in the  mid esophagus,uT3, 3 suspicious lymph nodes near the primary tumor, malignant appearing lymph node at the gastrohepatic ligament (level 18)-metastatic squamous cell carcinoma ? Radiation 01/31/2020 ? Cycle 1 weekly Taxol/carboplatin 02/01/2020 ? Cycle 2 weekly Taxol/carboplatin 02/08/2020 ? Cycle 3 weekly Taxol/carboplatin 02/15/2020 ? Cycle 4 weekly Taxol/carboplatin 02/22/2020 ? Cycle 5 weekly Taxol/carboplatin 02/29/2020 ? Radiation completed 03/08/2020 2. Right breast cancer July 1996, [name], [name], right lumpectomy and axillary dissection followed by adjuvant Adriamycin/Taxol chemotherapy-4 cycles, 5 years of tamoxifen, and breast radiation  3.Left breast DCIS January 2011 -1.1 cm, [name], [name], left breast lumpectomy followed by adjuvant radiation and 5 years of tamoxifen  4.Depression  5.Hypertension  6.Right eye uveitis, macular edema, and corneal edema  7.Asthma  8.Odynophagia secondary to radiation  Disposition: Jo Morrison continues to have dysphagia/odynophagia.  She is tolerating some liquids.  She agrees to IV fluids on Tuesday, Thursday, Saturday this week.  She will try to increase nutritional supplement intake as well.  We will see her back in 1 week to reevaluate.  She will contact the office in the interim with any problems.  Patient seen with Dr. Benay Spice.    Ned Card ANP/GNP-BC   03/20/2020  11:32 AM This was a shared visit with Ned Card.  Jo Morrison continues to have significant odynophagia.  We encouraged her to increase liquid intake as tolerated.  She will receive intravenous fluids this week.  Julieanne Manson, MD

## 2020-03-20 NOTE — Telephone Encounter (Signed)
Scheduled per los. Gave calendar  

## 2020-03-22 ENCOUNTER — Other Ambulatory Visit: Payer: Self-pay | Admitting: *Deleted

## 2020-03-22 ENCOUNTER — Encounter: Payer: Self-pay | Admitting: *Deleted

## 2020-03-22 ENCOUNTER — Other Ambulatory Visit: Payer: Self-pay

## 2020-03-22 ENCOUNTER — Inpatient Hospital Stay: Payer: PPO

## 2020-03-22 DIAGNOSIS — Z5111 Encounter for antineoplastic chemotherapy: Secondary | ICD-10-CM | POA: Diagnosis not present

## 2020-03-22 DIAGNOSIS — C158 Malignant neoplasm of overlapping sites of esophagus: Secondary | ICD-10-CM

## 2020-03-22 MED ORDER — SODIUM CHLORIDE 0.9 % IV SOLN
INTRAVENOUS | Status: DC
  Filled 2020-03-22: qty 250

## 2020-03-22 MED ORDER — SODIUM CHLORIDE 0.9 % IV SOLN
INTRAVENOUS | Status: DC
  Filled 2020-03-22 (×2): qty 250

## 2020-03-22 NOTE — Patient Instructions (Addendum)

## 2020-03-22 NOTE — Progress Notes (Signed)
At request of Dr. Benay Spice sent email to [facility] requesting PD1 testing on case #[id number] dated 01/14/20. Dx: C15.8 Stage: III

## 2020-03-24 ENCOUNTER — Other Ambulatory Visit: Payer: Self-pay

## 2020-03-24 ENCOUNTER — Encounter: Payer: Self-pay | Admitting: *Deleted

## 2020-03-24 ENCOUNTER — Inpatient Hospital Stay: Payer: PPO

## 2020-03-24 DIAGNOSIS — C158 Malignant neoplasm of overlapping sites of esophagus: Secondary | ICD-10-CM

## 2020-03-24 DIAGNOSIS — Z5111 Encounter for antineoplastic chemotherapy: Secondary | ICD-10-CM | POA: Diagnosis not present

## 2020-03-24 MED ORDER — SODIUM CHLORIDE 0.9 % IV SOLN
INTRAVENOUS | Status: DC
  Filled 2020-03-24 (×2): qty 250

## 2020-03-24 NOTE — Progress Notes (Signed)
Faxed referral order, demographics, insurance card, chemo careplan, last office note, pathology reports, med list and radiology reports. Email to radiology tech, Tedra Coupe to [employer] films to [place]. Fax [phone]

## 2020-03-24 NOTE — Patient Instructions (Signed)

## 2020-03-25 ENCOUNTER — Inpatient Hospital Stay: Payer: PPO

## 2020-03-26 ENCOUNTER — Encounter: Payer: Self-pay | Admitting: Internal Medicine

## 2020-03-28 ENCOUNTER — Other Ambulatory Visit: Payer: Self-pay

## 2020-03-28 ENCOUNTER — Telehealth: Payer: Self-pay | Admitting: Oncology

## 2020-03-28 ENCOUNTER — Encounter: Payer: Self-pay | Admitting: Oncology

## 2020-03-28 ENCOUNTER — Inpatient Hospital Stay: Payer: PPO | Attending: Oncology | Admitting: Oncology

## 2020-03-28 VITALS — BP 140/89 | HR 104 | Temp 98.0°F | Resp 17 | Ht 63.0 in | Wt 104.4 lb

## 2020-03-28 DIAGNOSIS — Z853 Personal history of malignant neoplasm of breast: Secondary | ICD-10-CM | POA: Diagnosis not present

## 2020-03-28 DIAGNOSIS — J45909 Unspecified asthma, uncomplicated: Secondary | ICD-10-CM | POA: Insufficient documentation

## 2020-03-28 DIAGNOSIS — Z9221 Personal history of antineoplastic chemotherapy: Secondary | ICD-10-CM | POA: Insufficient documentation

## 2020-03-28 DIAGNOSIS — I10 Essential (primary) hypertension: Secondary | ICD-10-CM | POA: Insufficient documentation

## 2020-03-28 DIAGNOSIS — C50511 Malignant neoplasm of lower-outer quadrant of right female breast: Secondary | ICD-10-CM

## 2020-03-28 DIAGNOSIS — Z923 Personal history of irradiation: Secondary | ICD-10-CM | POA: Insufficient documentation

## 2020-03-28 DIAGNOSIS — F329 Major depressive disorder, single episode, unspecified: Secondary | ICD-10-CM | POA: Diagnosis not present

## 2020-03-28 DIAGNOSIS — H209 Unspecified iridocyclitis: Secondary | ICD-10-CM | POA: Diagnosis not present

## 2020-03-28 DIAGNOSIS — I2699 Other pulmonary embolism without acute cor pulmonale: Secondary | ICD-10-CM | POA: Diagnosis not present

## 2020-03-28 DIAGNOSIS — H1820 Unspecified corneal edema: Secondary | ICD-10-CM | POA: Diagnosis not present

## 2020-03-28 DIAGNOSIS — C154 Malignant neoplasm of middle third of esophagus: Secondary | ICD-10-CM | POA: Diagnosis not present

## 2020-03-28 DIAGNOSIS — Z170 Estrogen receptor positive status [ER+]: Secondary | ICD-10-CM | POA: Diagnosis not present

## 2020-03-28 NOTE — Telephone Encounter (Signed)
Scheduled per los. Patient declined printout  

## 2020-03-28 NOTE — Progress Notes (Signed)
[place] OFFICE PROGRESS NOTE   Diagnosis: Esophagus cancer  INTERVAL HISTORY:   Jo Morrison returns as scheduled.  She reports improvement in dysphagia odynophagia.  She is now tolerating liquids and soft food.  No presyncope symptoms.  She is scheduled for a PET scan and follow-up visit with Dr. Servando Snare next week.  She will also see Dr. Fanny Skates for a second opinion.  Objective:  Vital signs in last 24 hours:  Blood pressure 140/89, pulse (!) 104, temperature 98 F (36.7 C), temperature source Temporal, resp. rate 17, height _0  (1.6 m), weight 104 lb 6.4 oz (47.4 kg), SpO2 100 %.    HEENT: No thrush, the mucous membranes are moist Resp: Lungs clear bilaterally Cardio: Regular rate and rhythm GI: No hepatosplenomegaly, nontender Vascular: No leg edema  Skin: Mild radiation hyperpigmentation at the mid back    Lab Results:  Lab Results  Component Value Date   WBC 3.1 (L) 03/13/2020   HGB 13.2 03/13/2020   HCT 38.9 03/13/2020   MCV 95.1 03/13/2020   PLT 193 03/13/2020   NEUTROABS 1.9 03/13/2020    CMP  Lab Results  Component Value Date   NA 141 02/21/2020   K 3.5 02/21/2020   CL 106 02/21/2020   CO2 28 02/21/2020   GLUCOSE 146 (H) 02/21/2020   BUN 10 02/21/2020   CREATININE 0.71 02/21/2020   CALCIUM 8.9 02/21/2020   PROT 6.4 (L) 02/21/2020   ALBUMIN 3.4 (L) 02/21/2020   AST 16 02/21/2020   ALT 12 02/21/2020   ALKPHOS 44 02/21/2020   BILITOT 0.8 02/21/2020   GFRNONAA >60 02/21/2020   GFRAA >60 02/21/2020    No results found for: CEA1  No results found for: INR  Imaging:  No results found.  Medications: I have reviewed the patient's current medications.   Assessment/Plan: 1. 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 Upper endoscopy 01/14/2020-ulcerated mass in the proximal/mid esophagus at 26-31 cm from the incisors, biopsy confirmed invasive moderately differentiated squamous cell carcinoma, PD-L1  combined positive score 0% ? CT abdomen/pelvis 12/09/2019-uterine fibroids ? CT chest 12/30/2019-normal esophagus, new sclerosis of the medial head of the right clavicle likely degenerative ? PET scan 01/24/2020-long segment of hypermetabolic thickening of the distal esophagus. For hypermetabolic metastatic lymph nodes, 3 in the mediastinum and 1 in the upper abdomen gastrohepatic ligament. ? EUS 02/10/2020-ulcerated mass in the mid esophagus,uT3, 3 suspicious lymph nodes near the primary tumor, malignant appearing lymph node at the gastrohepatic ligament (level 18)-metastatic squamous cell carcinoma ? Radiation 01/31/2020 ? Cycle 1 weekly Taxol/carboplatin 02/01/2020 ? Cycle 2 weekly Taxol/carboplatin 02/08/2020 ? Cycle 3 weekly Taxol/carboplatin 02/15/2020 ? Cycle 4 weekly Taxol/carboplatin 02/22/2020 ? Cycle 5 weekly Taxol/carboplatin 02/29/2020 ? Radiation completed 03/08/2020 2. Right breast cancer July 1996, [name], [name], right lumpectomy and axillary dissection followed by adjuvant Adriamycin/Taxol chemotherapy-4 cycles, 5 years of tamoxifen, and breast radiation  3.Left breast DCIS January 2011 -1.1 cm, [name], [name], left breast lumpectomy followed by adjuvant radiation and 5 years of tamoxifen  4.Depression  5.Hypertension  6.Right eye uveitis, macular edema, and corneal edema  7.Asthma  8.Odynophagia secondary to radiation -resolved    Disposition: Ms. Driskill has completed concurrent chemotherapy and radiation for treatment of esophagus cancer.  Chest/back pain has resolved.  Radiation induced odynophagia has improved.  She is now tolerating liquids and a soft diet.  She will undergo restaging CTs prior to a follow-up visit with Dr. Servando Snare.  She will return for an office visit here on 04/12/2020.  Betsy Coder, MD  03/28/2020  1:28 PM

## 2020-03-30 ENCOUNTER — Telehealth: Payer: Self-pay | Admitting: *Deleted

## 2020-03-30 ENCOUNTER — Telehealth: Payer: Self-pay | Admitting: Radiation Oncology

## 2020-03-30 NOTE — Telephone Encounter (Signed)
  Radiation Oncology         [phone] ________________________________  Name: Jo Morrison MRN: [id number]  Date of Service: 03/30/2020  DOB: March 09, 1946  Post Treatment Telephone Note  Diagnosis:  Squamous Cell Carcinoma of the proximal and mid esophagus.  Interval Since Last Radiation:  3 weeks   01/31/20-03/08/20: The esophagus target was treated to 50 Gy in 25 fractions followed by a 6 Gy Boost.   01/22/10-02/26/10: The left breast was treated over 4 weeks at [facility] in [facility], details are otherwise unknown.   1996: The right breast was treated over 6 1/2 weeks at [facility] in [facility], details are otherwise unknown.    Narrative:  The patient was contacted today for routine follow-up. During treatment she did very well with radiotherapy and did not have significant desquamation. She  did have esophagitis but this has since resolved.   Impression/Plan: 1. Squamous Cell Carcinoma of the proximal and mid esophagus. The patient has been doing well since completion of radiotherapy per notes from medical oncology. I was not able to reach the patient but left her a voicemail and discussed that we would be happy to continue to follow her as needed, but she will also continue to follow up with Dr. Benay Spice in medical oncology. She is also planning on reimaging with CT on Monday and will follow up with Dr. Servando Snare as she contemplates options for surgical resection. I encouraged her to call me back if she has questions or concerns she'd like to discuss.    Carola Rhine, PAC

## 2020-03-30 NOTE — Telephone Encounter (Signed)
Called patient to f/u on referral. She sees Dr. Fanny Skates at [facility] on 04/10/20.

## 2020-04-03 ENCOUNTER — Ambulatory Visit
Admission: RE | Admit: 2020-04-03 | Discharge: 2020-04-03 | Disposition: A | Discharge status: Home / Self Care | Payer: PPO | Source: Ambulatory Visit | Attending: Cardiothoracic Surgery | Admitting: Cardiothoracic Surgery

## 2020-04-03 ENCOUNTER — Encounter: Payer: Self-pay | Admitting: Nurse Practitioner

## 2020-04-03 ENCOUNTER — Other Ambulatory Visit: Payer: Self-pay

## 2020-04-03 ENCOUNTER — Inpatient Hospital Stay: Payer: PPO | Admitting: Nurse Practitioner

## 2020-04-03 VITALS — BP 131/89 | HR 110 | Temp 98.7°F | Resp 18 | Ht 63.0 in | Wt 109.1 lb

## 2020-04-03 DIAGNOSIS — R918 Other nonspecific abnormal finding of lung field: Secondary | ICD-10-CM | POA: Diagnosis not present

## 2020-04-03 DIAGNOSIS — C154 Malignant neoplasm of middle third of esophagus: Secondary | ICD-10-CM

## 2020-04-03 DIAGNOSIS — C158 Malignant neoplasm of overlapping sites of esophagus: Secondary | ICD-10-CM

## 2020-04-03 DIAGNOSIS — I2699 Other pulmonary embolism without acute cor pulmonale: Secondary | ICD-10-CM | POA: Diagnosis not present

## 2020-04-03 DIAGNOSIS — N838 Other noninflammatory disorders of ovary, fallopian tube and broad ligament: Secondary | ICD-10-CM | POA: Diagnosis not present

## 2020-04-03 MED ORDER — ENOXAPARIN SODIUM 80 MG/0.8ML ~~LOC~~ SOLN: 80.0000 mg | Freq: Once | SUBCUTANEOUS | Status: DC

## 2020-04-03 MED ORDER — ENOXAPARIN SODIUM 80 MG/0.8ML ~~LOC~~ SOLN: 80.0000 mg | SUBCUTANEOUS | 2 refills | Status: DC

## 2020-04-03 MED ORDER — ENOXAPARIN SODIUM 100 MG/ML ~~LOC~~ SOLN
80.0000 mg | Freq: Once | SUBCUTANEOUS | Status: AC
  Administered 2020-04-03: 80 mg via SUBCUTANEOUS
  Filled 2020-04-03: qty 1

## 2020-04-03 MED ORDER — ENOXAPARIN SODIUM 100 MG/ML ~~LOC~~ SOLN: 100.0000 mg | Freq: Once | SUBCUTANEOUS | Status: DC

## 2020-04-03 MED ORDER — IOPAMIDOL (ISOVUE-300) INJECTION 61%
100.0000 mL | Freq: Once | INTRAVENOUS | Status: AC | PRN
  Administered 2020-04-03: 12:00:00 100 mL via INTRAVENOUS

## 2020-04-03 NOTE — Progress Notes (Addendum)
[place] OFFICE PROGRESS NOTE   Diagnosis: Esophagus cancer  INTERVAL HISTORY:   Ms. Stiefel returns prior to scheduled follow-up due to incidental finding of a PE on CT earlier today.  She denies shortness of breath.  No chest pain.  She notes she is "coughing" more than usual.  She denies leg swelling.  No calf pain.  Objective:  Vital signs in last 24 hours:  Blood pressure 131/89, pulse (!) 110, temperature 98.7 F (37.1 C), temperature source Temporal, resp. rate 18, height _0  (1.6 m), weight 109 lb 1.6 oz (49.5 kg), SpO2 99 %.    Resp: Lungs clear bilaterally. Cardio: Tachycardia, regular.   GI: Abdomen soft and nontender.  No hepatomegaly. Vascular: Left lower leg appears slightly larger than the right leg.   Lab Results:  Lab Results  Component Value Date   WBC 3.1 (L) 03/13/2020   HGB 13.2 03/13/2020   HCT 38.9 03/13/2020   MCV 95.1 03/13/2020   PLT 193 03/13/2020   NEUTROABS 1.9 03/13/2020    Imaging:  CT CHEST W CONTRAST  Result Date: 04/03/2020 CLINICAL DATA:  74 year old female with history of esophageal cancer status post chemotherapy and radiation therapy. [name] study. EXAM: CT CHEST, ABDOMEN AND PELVIS WITHOUT CONTRAST TECHNIQUE: Multidetector CT imaging of the chest, abdomen and pelvis was performed following the standard protocol without IV contrast. COMPARISON:  PET-CT 01/24/2020. Chest CT 12/30/2019. CT the abdomen and pelvis 12/09/2019. FINDINGS: CT CHEST FINDINGS Cardiovascular: Nonocclusive filling defects within the right descending pulmonary artery extending into proximal segmental sized branches to the right lower and right middle lobes, compatible with nonobstructive emboli. Heart size is normal. There is no significant pericardial fluid, thickening or pericardial calcification. Aortic atherosclerosis. No definite coronary artery calcifications. Mediastinum/Nodes: No pathologically enlarged mediastinal or hilar lymph nodes.  Mild diffuse circumferential thickening in the esophagus most evident in the mid to distal esophagus, best appreciated on axial image 38 of series 2. No axillary lymphadenopathy. Lungs/Pleura: New ill-defined areas of ground-glass attenuation and nodularity in the right lower lobe basal segments, likely to represent evolving postradiation changes. This is best appreciated on axial image 125 of series 4 and axial image 135 of series 4. No other definite suspicious appearing pulmonary nodules or masses are noted. No pleural effusions. Musculoskeletal: Postoperative changes of lumpectomy in the subareolar aspect of the left breast. Levoscoliosis of the thoracolumbar spine. There are no aggressive appearing lytic or blastic lesions noted in the visualized portions of the skeleton. CT ABDOMEN PELVIS FINDINGS Hepatobiliary: No suspicious cystic or solid hepatic lesions. No intra or extrahepatic biliary ductal dilatation. Gallbladder is nearly completely decompressed, but otherwise unremarkable in appearance. Pancreas: No pancreatic mass. No pancreatic ductal dilatation. No pancreatic or peripancreatic fluid collections or inflammatory changes. Spleen: Unremarkable. Adrenals/Urinary Tract: Bilateral kidneys and bilateral adrenal glands are normal in appearance. No hydroureteronephrosis. Urinary bladder is normal in appearance. Stomach/Bowel: Normal appearance of the stomach. No pathologic dilatation of small bowel or colon. The appendix is not confidently identified and may be surgically absent. Regardless, there are no inflammatory changes noted adjacent to the cecum to suggest the presence of an acute appendicitis at this time. Vascular/Lymphatic: Aortic atherosclerosis, without evidence of aneurysm or dissection in the abdominal or pelvic vasculature. No lymphadenopathy noted in the abdomen or pelvis. Reproductive: Uterus is heterogeneous in appearance with multiple small lesions measuring up to 2.2 cm in diameter in  the left side of the uterine fundus, likely to represent fibroids. Ovaries are a trophic.  Other: No significant volume of ascites.  No pneumoperitoneum. Musculoskeletal: There are no aggressive appearing lytic or blastic lesions noted in the visualized portions of the skeleton. IMPRESSION: 1. Nonocclusive pulmonary embolus in right-sided pulmonary arteries, as above. 2. Today's study demonstrates a positive response to therapy with some regression of the esophageal mass which appears grossly smaller to the prior PET-CT on today's examination, and regression of previously noted lymphadenopathy in the mediastinum and upper abdomen. 3. Ill-defined areas of ground-glass attenuation and nodularity in the right lower lobe. This is likely to reflect evolving postradiation changes. Strictly speaking, the possibility of some alveolar hemorrhage in the setting of pulmonary infarction is not entirely excluded, particularly in light of the right-sided pulmonary emboli. 4. Additional incidental findings, as above. Critical Value/emergent results were called by telephone at the time of interpretation on 04/03/2020 at 1:13 pm to nurse Levonne Spiller for Dr. Lanelle Bal , who verbally acknowledged these results. Electronically Signed   By: Vinnie Langton M.D.   On: 04/03/2020 13:13   CT ABDOMEN PELVIS W CONTRAST  Result Date: 04/03/2020 CLINICAL DATA:  74 year old female with history of esophageal cancer status post chemotherapy and radiation therapy. [name] study. EXAM: CT CHEST, ABDOMEN AND PELVIS WITHOUT CONTRAST TECHNIQUE: Multidetector CT imaging of the chest, abdomen and pelvis was performed following the standard protocol without IV contrast. COMPARISON:  PET-CT 01/24/2020. Chest CT 12/30/2019. CT the abdomen and pelvis 12/09/2019. FINDINGS: CT CHEST FINDINGS Cardiovascular: Nonocclusive filling defects within the right descending pulmonary artery extending into proximal segmental sized branches to the right lower  and right middle lobes, compatible with nonobstructive emboli. Heart size is normal. There is no significant pericardial fluid, thickening or pericardial calcification. Aortic atherosclerosis. No definite coronary artery calcifications. Mediastinum/Nodes: No pathologically enlarged mediastinal or hilar lymph nodes. Mild diffuse circumferential thickening in the esophagus most evident in the mid to distal esophagus, best appreciated on axial image 38 of series 2. No axillary lymphadenopathy. Lungs/Pleura: New ill-defined areas of ground-glass attenuation and nodularity in the right lower lobe basal segments, likely to represent evolving postradiation changes. This is best appreciated on axial image 125 of series 4 and axial image 135 of series 4. No other definite suspicious appearing pulmonary nodules or masses are noted. No pleural effusions. Musculoskeletal: Postoperative changes of lumpectomy in the subareolar aspect of the left breast. Levoscoliosis of the thoracolumbar spine. There are no aggressive appearing lytic or blastic lesions noted in the visualized portions of the skeleton. CT ABDOMEN PELVIS FINDINGS Hepatobiliary: No suspicious cystic or solid hepatic lesions. No intra or extrahepatic biliary ductal dilatation. Gallbladder is nearly completely decompressed, but otherwise unremarkable in appearance. Pancreas: No pancreatic mass. No pancreatic ductal dilatation. No pancreatic or peripancreatic fluid collections or inflammatory changes. Spleen: Unremarkable. Adrenals/Urinary Tract: Bilateral kidneys and bilateral adrenal glands are normal in appearance. No hydroureteronephrosis. Urinary bladder is normal in appearance. Stomach/Bowel: Normal appearance of the stomach. No pathologic dilatation of small bowel or colon. The appendix is not confidently identified and may be surgically absent. Regardless, there are no inflammatory changes noted adjacent to the cecum to suggest the presence of an acute  appendicitis at this time. Vascular/Lymphatic: Aortic atherosclerosis, without evidence of aneurysm or dissection in the abdominal or pelvic vasculature. No lymphadenopathy noted in the abdomen or pelvis. Reproductive: Uterus is heterogeneous in appearance with multiple small lesions measuring up to 2.2 cm in diameter in the left side of the uterine fundus, likely to represent fibroids. Ovaries are a trophic. Other: No significant volume of  ascites.  No pneumoperitoneum. Musculoskeletal: There are no aggressive appearing lytic or blastic lesions noted in the visualized portions of the skeleton. IMPRESSION: 1. Nonocclusive pulmonary embolus in right-sided pulmonary arteries, as above. 2. Today's study demonstrates a positive response to therapy with some regression of the esophageal mass which appears grossly smaller to the prior PET-CT on today's examination, and regression of previously noted lymphadenopathy in the mediastinum and upper abdomen. 3. Ill-defined areas of ground-glass attenuation and nodularity in the right lower lobe. This is likely to reflect evolving postradiation changes. Strictly speaking, the possibility of some alveolar hemorrhage in the setting of pulmonary infarction is not entirely excluded, particularly in light of the right-sided pulmonary emboli. 4. Additional incidental findings, as above. Critical Value/emergent results were called by telephone at the time of interpretation on 04/03/2020 at 1:13 pm to nurse Levonne Spiller for Dr. Lanelle Bal , who verbally acknowledged these results. Electronically Signed   By: Vinnie Langton M.D.   On: 04/03/2020 13:13    Medications: I have reviewed the patient's current medications.  Assessment/Plan: 1. 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 Upper endoscopy 01/14/2020-ulcerated mass in the proximal/mid esophagus at 26-31 cm from the incisors, biopsy confirmed invasive moderately differentiated squamous cell  carcinoma, PD-L1 combined positive score 0% ? CT abdomen/pelvis 12/09/2019-uterine fibroids ? CT chest 12/30/2019-normal esophagus, new sclerosis of the medial head of the right clavicle likely degenerative ? PET scan 01/24/2020-long segment of hypermetabolic thickening of the distal esophagus. For hypermetabolic metastatic lymph nodes, 3 in the mediastinum and 1 in the upper abdomen gastrohepatic ligament. ? EUS 02/10/2020-ulcerated mass in the mid esophagus,uT3, 3 suspicious lymph nodes near the primary tumor, malignant appearing lymph node at the gastrohepatic ligament (level 18)-metastatic squamous cell carcinoma ? Radiation 01/31/2020 ? Cycle 1 weekly Taxol/carboplatin 02/01/2020 ? Cycle 2 weekly Taxol/carboplatin 02/08/2020 ? Cycle 3 weekly Taxol/carboplatin 02/15/2020 ? Cycle 4 weekly Taxol/carboplatin 02/22/2020 ? Cycle 5 weekly Taxol/carboplatin 02/29/2020 ? Radiation completed 03/08/2020 ? CTs 04/03/2020-esophageal mass appears grossly smaller, regression of previously noted lymphadenopathy in the mediastinum and upper abdomen, nonocclusive pulmonary embolus right-sided pulmonary arteries 2. 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  Pulmonary embolus on CT 04/03/2020-lovenox initiated  9.Odynophagia secondary toradiation -resolved    Disposition: Ms. Brotherton appears stable. She was found to have a PE on a follow-up CT scan earlier today. We discussed anticoagulation options. Dr. Benay Spice recommends Lovenox 1.5 mg/kg daily. We discussed the bleeding risk with blood thinners. We  discussed the rare side effect of heparin induced thrombocytopenia. She agrees with proceeding with Lovenox. First dose administered in the office. Prescription sent to her pharmacy. We are referring her for a venous doppler study of the left leg.  She will follow-up as scheduled.   Patient seen with Dr. Benay Spice.  Ned Card ANP/GNP-BC   04/03/2020  3:07 PM  This was a shared visit with Ned Card.  Ms. Weir has been diagnosed with a pulmonary embolism.  She appears stable for outpatient treatment.  I recommend lovenox. She began lovenox today.  The CT confirms a response to the chemotherapy and radiation.  We will decide on  additional therapy after consults with Dr. Servando Snare and Dr. Fanny Skates.  Julieanne Manson, MD

## 2020-04-03 NOTE — Progress Notes (Signed)
Patient demonstrated excellent technique administering the lovenox injection.

## 2020-04-04 ENCOUNTER — Ambulatory Visit (HOSPITAL_COMMUNITY)
Admission: RE | Admit: 2020-04-04 | Discharge: 2020-04-04 | Disposition: A | Discharge status: Home / Self Care | Payer: PPO | Source: Ambulatory Visit | Attending: Nurse Practitioner | Admitting: Nurse Practitioner

## 2020-04-04 DIAGNOSIS — C158 Malignant neoplasm of overlapping sites of esophagus: Secondary | ICD-10-CM

## 2020-04-04 NOTE — Progress Notes (Signed)
Lower extremity venous has been completed.   Preliminary results in CV Proc.   Abram Sander 04/04/2020 11:02 AM

## 2020-04-06 ENCOUNTER — Ambulatory Visit: Payer: PPO | Admitting: Cardiothoracic Surgery

## 2020-04-06 ENCOUNTER — Other Ambulatory Visit: Payer: Self-pay

## 2020-04-06 VITALS — BP 143/93 | HR 112 | Temp 97.5°F | Resp 16 | Ht 63.0 in | Wt 107.0 lb

## 2020-04-06 DIAGNOSIS — C154 Malignant neoplasm of middle third of esophagus: Secondary | ICD-10-CM | POA: Diagnosis not present

## 2020-04-06 NOTE — Progress Notes (Signed)
  Radiation Oncology         [phone] ________________________________  Name: Jo Morrison MRN: [id number]  Date: 04/07/2020  DOB: November 01, 1946  End of Treatment Note  Diagnosis:   Esophageal cancer     Indication for treatment::  curative       Radiation treatment dates:   01/31/2020 through 03/08/2020  Site/dose:   The patient was initially treated to a dose of 45 Gray in 25 fractions at 1.8 Gy per fraction to the primary target utilizing a 2 field IMRT technique.  A simultaneous integrated boost technique was used to treat the high dose region to a dose of 50 Gray concurrently.  A boost was then given for an additional 5.4 Gy to the primary target using a 2 field IMRT technique.  A simultaneous integrated boost technique was used for the boost treatment as well delivering 6 Gray to the high dose region.  The primary target received 50.4 Gray.  Narrative: The patient tolerated radiation treatment relatively well.  Skin irritation and esophageal irritation were present at the end of treatment.  Plan: The patient has completed radiation treatment. The patient will return to radiation oncology clinic for routine followup in one month. I advised the patient to call or return sooner if they have any questions or concerns related to their recovery or treatment. ________________________________  Jodelle Gross, M.D., Ph.D.

## 2020-04-06 NOTE — Progress Notes (Signed)
[address]       [place] [address]             [phone]                    Almas A Hanauer Combes Medical Record #[id number] Date of Birth: 1946/11/05  Referring: Hayden Pedro,* Primary Care: Hoyt Koch, MD Primary Cardiologist: No primary care provider on file.  Chief Complaint:    Chief Complaint  Patient presents with  . Esophageal Cancer    f/u after CT scans on 04/03/20, further discuss surgery    History of Present Illness:    Jo Morrison 74 y.o. 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  The patient notes that in September 2020 she began having some new onset of heartburn and some pain with eating.  Over-the-counter Pepcid and prescription gastric acid suppression did not help symptoms.  She noted increasing pain with eating sometimes anteriorly and sometimes radiating to the back.  Because of persistent symptoms she was referred to GI.  Dr. Carlean Purl did  upper endoscopy on 01/14/2020.  An ulcerated mass was found in the proximal esophagus and midesophagus at 26-31 cm from the incisors.  The mass was partially obstructing.  Biopsies were obtained.  The pathology revealed invasive moderately differentiated squamous cell carcinoma.  March 18, EUS was done identifying invasion depth UT3,   3 lymph nodes were identified in the area mid esophageal lesion and 1 gastrohepatic node positive on needle aspiration.  She is a "never" smoker.  Does drink 1 or 2 glasses of wine a day, never user of hard liquors.  Previous history is significant for breast cancer in the right breast in 1996 treated with lumpectomy radiation and chemotherapy and subsequent treatment for breast cancer left lumpectomy and radiation 2011.  Both of these malignancies were cared for in [place].  Cancer Staging Primary squamous cell carcinoma of overlapping sites of esophagus [facility]) Staging form:  Esophagus - Squamous Cell Carcinoma, AJCC 8th Edition - Clinical stage from 01/25/2020: Stage III (cT3, cN2, cM0, G2, L: Upper) - Signed by Grace Isaac, MD on 03/02/2020  Repeat staging CT scan was scheduled for 2 days ago-scan was done and there was immediate report of pulmonary embolus to the right lower lung-patient was seen in oncology clinic and started on Lovenox  Current Activity/ Functional Status:  Patient is independent with mobility/ambulation, transfers, ADL's, IADL's.   Zubrod Score: At the time of surgery this patient's most appropriate activity status/level should be described as: _0     0    Normal activity, no symptoms _1     1    Restricted in physical strenuous activity but ambulatory, able to do out light work _2     2    Ambulatory and capable of self care, unable to do work activities, up and about               >50 % of waking hours                              _3     3    Only limited self care, in bed greater than 50% of waking hours _4     4    Completely disabled, no self care, confined to bed or chair _5   5    Moribund   Past Medical History:  Diagnosis Date  . Allergy   . Anxiety   . Asthma   . Breast cancer ([place]) 2012   left  . Breast cancer, right ([place]) 1996   right  . Cataract    cataracts removed  . Chronic uveitis of right eye   . Colon polyps   . Corneal edema of right eye   . Cystoid macular edema of right eye   . Depressive disorder   . DVT (deep venous thrombosis) ([place])   . Family history of colon cancer   . Glaucoma    not on eye drops at this time.  . Hypertension   . Hypertensive retinopathy of both eyes   . Olecranon bursitis   . Osteoarthritis   . Other disorders of bone and cartilage(733.99)   . Pseudoexfoliation (PXF) glaucoma of both eyes   . Pseudophakia, both eyes   . Seasonal allergies   . Unspecified pruritic disorder     Past Surgical History:  Procedure Laterality Date  . APPENDECTOMY  1958  . BREAST LUMPECTOMY  Right 1996   Dr. Corena Herter in [place]  . BREAST LUMPECTOMY Left 2011   Dr. Pleas Patricia  . cataract Bilateral   . CATARACT EXTRACTION Bilateral    Dr. Lucita Ferrara  . COLONOSCOPY  2011-last  . DUPUYTREN CONTRACTURE RELEASE    . ESOPHAGOGASTRODUODENOSCOPY (EGD) WITH PROPOFOL N/A 02/10/2020   Procedure: ESOPHAGOGASTRODUODENOSCOPY (EGD) WITH PROPOFOL;  Surgeon: Milus Banister, MD;  Location: [facility];  Service: Endoscopy;  Laterality: N/A;  . FINE NEEDLE ASPIRATION N/A 02/10/2020   Procedure: FINE NEEDLE ASPIRATION (FNA) LINEAR;  Surgeon: Milus Banister, MD;  Location: [facility];  Service: Endoscopy;  Laterality: N/A;  . HAND SURGERY Right 2020  . LASIK Bilateral 2007  . POLYPECTOMY    . ROTATOR CUFF REPAIR  2007  . TONSILLECTOMY  1967  . UPPER ESOPHAGEAL ENDOSCOPIC ULTRASOUND (EUS) N/A 02/10/2020   Procedure: UPPER ESOPHAGEAL ENDOSCOPIC ULTRASOUND (EUS);  Surgeon: Milus Banister, MD;  Location: [facility];  Service: Endoscopy;  Laterality: N/A;    Family History  Problem Relation Age of Onset  . Stroke Father 34  . Heart disease Brother   . Colon cancer Maternal Aunt 35  . Macular degeneration Mother   . Cancer Maternal Grandmother        unknown type  . Colon polyps Neg Hx   . Esophageal cancer Neg Hx   . Rectal cancer Neg Hx   . Stomach cancer Neg Hx      Social History   Tobacco Use  Smoking Status Never Smoker  Smokeless Tobacco Never Used    Social History   Substance and Sexual Activity  Alcohol Use Yes  . Alcohol/week: 25.0 standard drinks  . Types: 25 Glasses of wine per week     Allergies  Allergen Reactions  . Latex Shortness Of Breath    Current Outpatient Medications  Medication Sig Dispense Refill  . Albuterol Sulfate (PROAIR RESPICLICK) 132 (90 BASE) MCG/ACT AEPB Inhale 2 puffs into the lungs every 4 (four) hours as needed (SOB). 1 each 0  . Bromfenac Sodium (PROLENSA) 0.07 % SOLN Place 1 drop into the right eye 4 (four) times daily.  (Patient taking differently: Place 1 drop into the right eye daily in the afternoon. ) 6 mL 3  . budesonide (PULMICORT) 180 MCG/ACT inhaler Inhale 2 puffs into the lungs daily as needed (Shortness of breath).     Marland Kitchen  chlorpheniramine-HYDROcodone (TUSSIONEX) 10-8 MG/5ML SUER Take 5 mLs by mouth every 12 (twelve) hours as needed for cough. 140 mL 0  . clonazePAM (KLONOPIN) 0.5 MG tablet Take 1 tablet (0.5 mg total) by mouth at bedtime as needed. 30 tablet 0  . dorzolamide-timolol (COSOPT) 22.3-6.8 MG/ML ophthalmic solution Place 1 drop into the right eye 2 (two) times daily.     Marland Kitchen enoxaparin (LOVENOX) 80 MG/0.8ML injection Inject 0.8 mLs (80 mg total) into the skin daily. 22.4 mL 2  . fluticasone (FLONASE) 50 MCG/ACT nasal spray Place 2 sprays into the nose daily as needed for allergies.     Marland Kitchen gabapentin (NEURONTIN) 100 MG capsule Take 100 mg by mouth at bedtime.   3  . loteprednol (LOTEMAX) 0.5 % ophthalmic suspension Place 1 drop into the right eye 4 (four) times daily.    . methocarbamol (ROBAXIN) 500 MG tablet Take 1 tablet (500 mg total) by mouth every 8 (eight) hours as needed for muscle spasms. 60 tablet 0  . montelukast (SINGULAIR) 10 MG tablet TAKE 1 TABLET(10 MG) BY MOUTH AT BEDTIME (Patient taking differently: Take 10 mg by mouth at bedtime. ) 90 tablet 1  . ondansetron (ZOFRAN) 8 MG tablet Take 1 tablet (8 mg total) by mouth every 8 (eight) hours as needed for nausea or vomiting. 30 tablet 1  . oxycodone (OXY-IR) 5 MG capsule Take 5 mg by mouth at bedtime as needed for pain.    . pantoprazole (PROTONIX) 40 MG tablet Take 40 mg by mouth daily.    . prochlorperazine (COMPAZINE) 10 MG tablet Take 1 tablet (10 mg total) by mouth every 6 (six) hours as needed. 30 tablet 1  . REFRESH OPTIVE ADVANCED 0.5-1-0.5 % SOLN Place 1 drop into both eyes daily as needed (itching Eyes).     . sodium chloride (MURO 128) 2 % ophthalmic solution Place 1 drop into the right eye 4 (four) times daily.    . sodium  chloride (MURO 128) 5 % ophthalmic ointment Place 1 application into the right eye at bedtime.    . sucralfate (CARAFATE) 1 g tablet Take 1 tablet (1 g total) by mouth 4 (four) times daily. Dissolve each tablet in 15 cc water before use. 120 tablet 2  . traZODone (DESYREL) 100 MG tablet TAKE 1 TO 2 TABLETS(100 TO 200 MG) BY MOUTH AT BEDTIME AS NEEDED FOR SLEEP (Patient taking differently: Take 150 mg by mouth at bedtime. ) 180 tablet 3  . triamcinolone cream (KENALOG) 0.1 % Apply 1 application topically 2 (two) times daily. 100 g 0  . venlafaxine XR (EFFEXOR-XR) 150 MG 24 hr capsule Take 1 capsule (150 mg total) by mouth every morning. (Patient taking differently: Take 150 mg by mouth daily. ) 90 capsule 3  . b complex vitamins capsule Take 1 capsule by mouth daily. (Patient not taking: Reported on 04/06/2020) 30 capsule 11  . Cholecalciferol (VITAMIN D) 50 MCG (2000 UT) CAPS Take 1 capsule (2,000 Units total) by mouth daily. (Patient not taking: Reported on 04/06/2020) 30 capsule 11  . hydrochlorothiazide (HYDRODIURIL) 25 MG tablet TAKE 1 TABLET(25 MG) BY MOUTH DAILY (Patient not taking: Reported on 04/06/2020) 90 tablet 3  . Multiple Vitamins-Minerals (MULTIPLE VITAMINS/WOMENS) tablet Take 1 tablet by mouth daily. (Patient not taking: Reported on 04/06/2020) 30 tablet 11  . valACYclovir (VALTREX) 1000 MG tablet Take 1,000 mg by mouth daily.      No current facility-administered medications for this visit.      Review of  Systems:     Cardiac Review of Systems: [Y] = yes  or   [ N ] = no   Chest Pain [  y  ]  Resting SOB [  n ] Exertional SOB  [n  ]  Orthopnea [ n ]   Pedal Edema [ n  ]    Palpitations [n  ] Syncope  [ n ]   Presyncope [  n ]   General Review of Systems: [Y] = yes [  ]=no Constitional: recent weight change Blue.Reese  ];  Wt loss over the last 3 months [ 129-119   ] anorexia [  ]; fatigue [  ]; nausea [  ]; night sweats [  ]; fever [  ]; or chills [  ];           Eye : blurred vision [   ]; diplopia [   ]; vision changes [  ];  Amaurosis fugax[  ]; Resp: cough [  ];  wheezing[  ];  hemoptysis[  ]; shortness of breath[  ]; paroxysmal nocturnal dyspnea[  ]; dyspnea on exertion[  ]; or orthopnea[  ];  GI:  gallstones[  ], vomiting[  ];  dysphagia[y ]; melena[  ];  hematochezia [  ]; heartburn[ y ];   Hx of  Colonoscopy[  ]; GU: kidney stones [  ]; hematuria[  ];   dysuria [  ];  nocturia[  ];  history of     obstruction [  ]; urinary frequency [  ]             Skin: rash, swelling[  ];, hair loss[  ];  peripheral edema[  ];  or itching[  ]; Musculosketetal: myalgias[  ];  joint swelling[  ];  joint erythema[  ];  joint pain[  ];  back pain[  ]; recent hand surgery   Heme/Lymph: bruising[  ];  bleeding[  ];  anemia[  ];  Neuro: TIA[  ];  headaches[  ];  stroke[  ];  vertigo[  ];  seizures[  ];   paresthesias[  ];  difficulty walking[  ];  Psych:depression[y  ]; anxiety[ y ];  Endocrine: diabetes[  ];  thyroid dysfunction[  ];  Immunizations: Flu up to date Blue.Reese  ]; Pneumococcal up to date [ y ];  Other:  Wt Readings from Last 3 Encounters:  04/06/20 107 lb (48.5 kg)  04/03/20 109 lb 1.6 oz (49.5 kg)  03/28/20 104 lb 6.4 oz (47.4 kg)   ENDOSCOPIC FINDING: Findings: 1. Ulcerated, friable mass in the mid esophagus already proven to be squamous cell carcinoma on previous biopsies. This mass is non circumferential, 5-6cm long with proximal edge located 25cm from the incisors. 2. Otherwise normal examination to the distal stomach. ENDOSONOGRAPHIC FINDING: 1. The mid esophagus mass described above correlates with a hypoechoic mass that clearly passes into and through the muscularis propria layer of the esophageal wall (uT3). 2. Three round, suspicious lymphnodes abut the esophagus very near to the primary tumor. 3. One malignant-appearing lymph node was visualized in the gastrohepatic ligament (level 18). It measured 12 mm by 11 mm in maximal cross-sectional diameter. The node was  round, hypoechoic and had well defined margins. Fine needle aspiration for cytology was performed. Color Doppler imaging was utilized prior to needle puncture to confirm a lack of significant vascular structures within the needle path. Two passes were made with the 25 gaugpresent to evaluate the adequacy of the specimen.  -  Squamous cell cancer of the mid esophagus with suspicious local (paraesophageal) adenopathy. PET avid gastrohepatic ligament LN was located and sampled by transgastric EUS guided FNA. Preliminary cytology was positive for metastatic squamous cell to the gastrohepatic lymphnode. needle using a transgastric approach.   Clinical History: Esophageal cancer staging  Specimen Submitted: A. LYMPH NODE, GASTRO-HEPATIC LIGAMENT, FINE NEEDLE  ASPIRATION:    FINAL MICROSCOPIC DIAGNOSIS:  - Malignant cells consistent with metastatic squamous cell carcinoma   SPECIMEN ADEQUACY:  Satisfactory for evaluation   IMMEDIATE EVALUATION:  ADEQUATE ([facility])    PHYSICAL EXAMINATION: BP (!) 143/93 (BP Location: Left Arm)   Pulse (!) 112 Comment: HRR  Temp (!) 97.5 F (36.4 C) (Temporal)   Resp 16   Ht _0  (1.6 m)   Wt 107 lb (48.5 kg)   SpO2 98% Comment: RA  BMI 18.95 kg/m  General appearance: alert, cooperative and no distress Head: Normocephalic, without obvious abnormality, atraumatic Neck: no adenopathy, no carotid bruit, no JVD, supple, symmetrical, trachea midline and thyroid not enlarged, symmetric, no tenderness/mass/nodules Lymph nodes: Cervical, supraclavicular, and axillary nodes normal. Resp: clear to auscultation bilaterally Cardio: regular rate and rhythm, S1, S2 normal, no murmur, click, rub or gallop GI: soft, non-tender; bowel sounds normal; no masses,  no organomegaly Extremities: extremities normal, atraumatic, no cyanosis or edema and Homans sign is negative, no sign of DVT Neurologic: Grossly normal  Diagnostic Studies & Laboratory data:     Recent  Radiology Findings:   CT CHEST W CONTRAST  Result Date: 04/03/2020 CLINICAL DATA:  74 year old female with history of esophageal cancer status post chemotherapy and radiation therapy. [name] study. EXAM: CT CHEST, ABDOMEN AND PELVIS WITHOUT CONTRAST TECHNIQUE: Multidetector CT imaging of the chest, abdomen and pelvis was performed following the standard protocol without IV contrast. COMPARISON:  PET-CT 01/24/2020. Chest CT 12/30/2019. CT the abdomen and pelvis 12/09/2019. FINDINGS: CT CHEST FINDINGS Cardiovascular: Nonocclusive filling defects within the right descending pulmonary artery extending into proximal segmental sized branches to the right lower and right middle lobes, compatible with nonobstructive emboli. Heart size is normal. There is no significant pericardial fluid, thickening or pericardial calcification. Aortic atherosclerosis. No definite coronary artery calcifications. Mediastinum/Nodes: No pathologically enlarged mediastinal or hilar lymph nodes. Mild diffuse circumferential thickening in the esophagus most evident in the mid to distal esophagus, best appreciated on axial image 38 of series 2. No axillary lymphadenopathy. Lungs/Pleura: New ill-defined areas of ground-glass attenuation and nodularity in the right lower lobe basal segments, likely to represent evolving postradiation changes. This is best appreciated on axial image 125 of series 4 and axial image 135 of series 4. No other definite suspicious appearing pulmonary nodules or masses are noted. No pleural effusions. Musculoskeletal: Postoperative changes of lumpectomy in the subareolar aspect of the left breast. Levoscoliosis of the thoracolumbar spine. There are no aggressive appearing lytic or blastic lesions noted in the visualized portions of the skeleton. CT ABDOMEN PELVIS FINDINGS Hepatobiliary: No suspicious cystic or solid hepatic lesions. No intra or extrahepatic biliary ductal dilatation. Gallbladder is nearly completely  decompressed, but otherwise unremarkable in appearance. Pancreas: No pancreatic mass. No pancreatic ductal dilatation. No pancreatic or peripancreatic fluid collections or inflammatory changes. Spleen: Unremarkable. Adrenals/Urinary Tract: Bilateral kidneys and bilateral adrenal glands are normal in appearance. No hydroureteronephrosis. Urinary bladder is normal in appearance. Stomach/Bowel: Normal appearance of the stomach. No pathologic dilatation of small bowel or colon. The appendix is not confidently identified and may be surgically absent. Regardless, there are no inflammatory changes noted  adjacent to the cecum to suggest the presence of an acute appendicitis at this time. Vascular/Lymphatic: Aortic atherosclerosis, without evidence of aneurysm or dissection in the abdominal or pelvic vasculature. No lymphadenopathy noted in the abdomen or pelvis. Reproductive: Uterus is heterogeneous in appearance with multiple small lesions measuring up to 2.2 cm in diameter in the left side of the uterine fundus, likely to represent fibroids. Ovaries are a trophic. Other: No significant volume of ascites.  No pneumoperitoneum. Musculoskeletal: There are no aggressive appearing lytic or blastic lesions noted in the visualized portions of the skeleton. IMPRESSION: 1. Nonocclusive pulmonary embolus in right-sided pulmonary arteries, as above. 2. Today's study demonstrates a positive response to therapy with some regression of the esophageal mass which appears grossly smaller to the prior PET-CT on today's examination, and regression of previously noted lymphadenopathy in the mediastinum and upper abdomen. 3. Ill-defined areas of ground-glass attenuation and nodularity in the right lower lobe. This is likely to reflect evolving postradiation changes. Strictly speaking, the possibility of some alveolar hemorrhage in the setting of pulmonary infarction is not entirely excluded, particularly in light of the right-sided pulmonary  emboli. 4. Additional incidental findings, as above. Critical Value/emergent results were called by telephone at the time of interpretation on 04/03/2020 at 1:13 pm to nurse Levonne Spiller for Dr. Lanelle Bal , who verbally acknowledged these results. Electronically Signed   By: Vinnie Langton M.D.   On: 04/03/2020 13:13   CT ABDOMEN PELVIS W CONTRAST  Result Date: 04/03/2020 CLINICAL DATA:  74 year old female with history of esophageal cancer status post chemotherapy and radiation therapy. [name] study. EXAM: CT CHEST, ABDOMEN AND PELVIS WITHOUT CONTRAST TECHNIQUE: Multidetector CT imaging of the chest, abdomen and pelvis was performed following the standard protocol without IV contrast. COMPARISON:  PET-CT 01/24/2020. Chest CT 12/30/2019. CT the abdomen and pelvis 12/09/2019. FINDINGS: CT CHEST FINDINGS Cardiovascular: Nonocclusive filling defects within the right descending pulmonary artery extending into proximal segmental sized branches to the right lower and right middle lobes, compatible with nonobstructive emboli. Heart size is normal. There is no significant pericardial fluid, thickening or pericardial calcification. Aortic atherosclerosis. No definite coronary artery calcifications. Mediastinum/Nodes: No pathologically enlarged mediastinal or hilar lymph nodes. Mild diffuse circumferential thickening in the esophagus most evident in the mid to distal esophagus, best appreciated on axial image 38 of series 2. No axillary lymphadenopathy. Lungs/Pleura: New ill-defined areas of ground-glass attenuation and nodularity in the right lower lobe basal segments, likely to represent evolving postradiation changes. This is best appreciated on axial image 125 of series 4 and axial image 135 of series 4. No other definite suspicious appearing pulmonary nodules or masses are noted. No pleural effusions. Musculoskeletal: Postoperative changes of lumpectomy in the subareolar aspect of the left breast.  Levoscoliosis of the thoracolumbar spine. There are no aggressive appearing lytic or blastic lesions noted in the visualized portions of the skeleton. CT ABDOMEN PELVIS FINDINGS Hepatobiliary: No suspicious cystic or solid hepatic lesions. No intra or extrahepatic biliary ductal dilatation. Gallbladder is nearly completely decompressed, but otherwise unremarkable in appearance. Pancreas: No pancreatic mass. No pancreatic ductal dilatation. No pancreatic or peripancreatic fluid collections or inflammatory changes. Spleen: Unremarkable. Adrenals/Urinary Tract: Bilateral kidneys and bilateral adrenal glands are normal in appearance. No hydroureteronephrosis. Urinary bladder is normal in appearance. Stomach/Bowel: Normal appearance of the stomach. No pathologic dilatation of small bowel or colon. The appendix is not confidently identified and may be surgically absent. Regardless, there are no inflammatory changes noted adjacent to the cecum to  suggest the presence of an acute appendicitis at this time. Vascular/Lymphatic: Aortic atherosclerosis, without evidence of aneurysm or dissection in the abdominal or pelvic vasculature. No lymphadenopathy noted in the abdomen or pelvis. Reproductive: Uterus is heterogeneous in appearance with multiple small lesions measuring up to 2.2 cm in diameter in the left side of the uterine fundus, likely to represent fibroids. Ovaries are a trophic. Other: No significant volume of ascites.  No pneumoperitoneum. Musculoskeletal: There are no aggressive appearing lytic or blastic lesions noted in the visualized portions of the skeleton. IMPRESSION: 1. Nonocclusive pulmonary embolus in right-sided pulmonary arteries, as above. 2. Today's study demonstrates a positive response to therapy with some regression of the esophageal mass which appears grossly smaller to the prior PET-CT on today's examination, and regression of previously noted lymphadenopathy in the mediastinum and upper abdomen.  3. Ill-defined areas of ground-glass attenuation and nodularity in the right lower lobe. This is likely to reflect evolving postradiation changes. Strictly speaking, the possibility of some alveolar hemorrhage in the setting of pulmonary infarction is not entirely excluded, particularly in light of the right-sided pulmonary emboli. 4. Additional incidental findings, as above. Critical Value/emergent results were called by telephone at the time of interpretation on 04/03/2020 at 1:13 pm to nurse Levonne Spiller for Dr. Lanelle Bal , who verbally acknowledged these results. Electronically Signed   By: Vinnie Langton M.D.   On: 04/03/2020 13:13   VAS [place] LOWER EXTREMITY VENOUS (DVT)  Result Date: 04/04/2020  Lower Venous DVTStudy Other Indications: [place] cancer, recent PE. Comparison Study: no prior Performing Technologist: Abram Sander RVS  Examination Guidelines: A complete evaluation includes B-mode imaging, spectral Doppler, color Doppler, and power Doppler as needed of all accessible portions of each vessel. Bilateral testing is considered an integral part of a complete examination. Limited examinations for reoccurring indications may be performed as noted. The reflux portion of the exam is performed with the patient in reverse Trendelenburg.  +-----+---------------+---------+-----------+----------+--------------+ RIGHTCompressibilityPhasicitySpontaneityPropertiesThrombus Aging +-----+---------------+---------+-----------+----------+--------------+ CFV  Full           Yes      Yes                                 +-----+---------------+---------+-----------+----------+--------------+   +---------+---------------+---------+-----------+----------+--------------+ LEFT     CompressibilityPhasicitySpontaneityPropertiesThrombus Aging +---------+---------------+---------+-----------+----------+--------------+ CFV      Full           Yes      Yes                                  +---------+---------------+---------+-----------+----------+--------------+ SFJ      Full                                                        +---------+---------------+---------+-----------+----------+--------------+ FV Prox  Full                                                        +---------+---------------+---------+-----------+----------+--------------+ FV Mid   Full                                                        +---------+---------------+---------+-----------+----------+--------------+  FV DistalFull                                                        +---------+---------------+---------+-----------+----------+--------------+ PFV      Full                                                        +---------+---------------+---------+-----------+----------+--------------+ POP      Full           Yes      Yes                                 +---------+---------------+---------+-----------+----------+--------------+ PTV      Full                                                        +---------+---------------+---------+-----------+----------+--------------+ PERO     Full                                                        +---------+---------------+---------+-----------+----------+--------------+     Summary: RIGHT: - No evidence of common femoral vein obstruction.  LEFT: - There is no evidence of deep vein thrombosis in the lower extremity.  - No cystic structure found in the popliteal fossa.  *See table(s) above for measurements and observations. Electronically signed by Harold Barban MD on 04/04/2020 at 9:46:53 PM.    Final     Recent Lab Findings: Lab Results  Component Value Date   WBC 3.1 (L) 03/13/2020   HGB 13.2 03/13/2020   HCT 38.9 03/13/2020   PLT 193 03/13/2020   GLUCOSE 146 (H) 02/21/2020   CHOL 179 09/24/2019   TRIG 102.0 09/24/2019   HDL 61.90 09/24/2019   LDLCALC 96 09/24/2019   ALT 12 02/21/2020   AST 16  02/21/2020   NA 141 02/21/2020   K 3.5 02/21/2020   CL 106 02/21/2020   CREATININE 0.71 02/21/2020   BUN 10 02/21/2020   CO2 28 02/21/2020   TSH 2.920 06/09/2014   - A large, ulcerating mass was found in the proximal esophagus and in the mid esophagus, 26 to 31 cm from the incisors. The mass was partially obstructing and partially circumferential (involving two thirds of the lumen circumference). Biopsies were taken with a cold forceps for histology. Verification of patient identification for the specimen was done. Estimated blood loss was minimal.   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 ,  clinical T3 , N2, M0 Stage IIIb   Cancer Staging Primary squamous cell carcinoma of overlapping sites of esophagus [facility]) Staging form: Esophagus - Squamous Cell Carcinoma, AJCC 8th Edition - Clinical stage from 01/25/2020: Stage III (  cT3, cN2, cM0, G2, L: Upper) - Signed by Grace Isaac, MD on 03/02/2020  2/Right breast cancer July 1996, [name], [name], right lumpectomy and axillary dissection followed by adjuvant AC chemotherapy-4 cycles, 5 years of tamoxifen, and breast radiation 3/Left breast DCIS January 2011 -1.1 cm, [name], [name], left breast lumpectomy followed by adjuvant radiation and 5 years of tamoxifen   4/history of hypertension   5/history of asthma  The patient has completed chemotherapy and radiation therapy she notes the cumulative effects of radiation had a significant effect on her overall wellbeing and endurance.  Patient comes in today to discuss surgical options.  On her previous visit she had her husband expressed concern about major surgical intervention and were interested in exploring options of consolidation therapy with one of the targeted agents.  They have an appointment at [facility] early next week and with Dr. Benay Spice next week to further  discuss.  We did assist in making available scans to take to her appointments. After the patient's first appointment at [facility] and also sees Dr. Benay Spice call back and further discuss options.   Grace Isaac MD      [address] [place] [address] Office [phone]     04/06/2020 5:39 PM

## 2020-04-07 ENCOUNTER — Other Ambulatory Visit: Payer: Self-pay

## 2020-04-07 ENCOUNTER — Inpatient Hospital Stay: Payer: PPO | Admitting: Oncology

## 2020-04-07 ENCOUNTER — Encounter: Payer: Self-pay | Admitting: Radiation Oncology

## 2020-04-07 VITALS — BP 144/85 | HR 97 | Temp 98.0°F | Resp 17 | Ht 63.0 in | Wt 105.7 lb

## 2020-04-07 DIAGNOSIS — I2699 Other pulmonary embolism without acute cor pulmonale: Secondary | ICD-10-CM | POA: Diagnosis not present

## 2020-04-07 DIAGNOSIS — C50511 Malignant neoplasm of lower-outer quadrant of right female breast: Secondary | ICD-10-CM | POA: Diagnosis not present

## 2020-04-07 DIAGNOSIS — Z170 Estrogen receptor positive status [ER+]: Secondary | ICD-10-CM

## 2020-04-07 NOTE — Progress Notes (Signed)
[facility] OFFICE PROGRESS NOTE   Diagnosis: Esophagus cancer  INTERVAL HISTORY:   Ms. Gilden returns for a scheduled visit.  Good appetite.  She is tolerating liquids and solids.  She reports anterior chest discomfort this week.  No dyspnea.  She is taking Lovenox daily.  She saw Dr. Servando Snare yesterday.  Objective:  Vital signs in last 24 hours:  Blood pressure (!) 144/85, pulse 97, temperature 98 F (36.7 C), temperature source Temporal, resp. rate 17, height _0  (1.6 m), weight 105 lb 11.2 oz (47.9 kg), SpO2 100 %.    Resp: Lungs clear bilaterally Cardio: Regular rate and rhythm Vascular: No leg edema    Lab Results:  Lab Results  Component Value Date   WBC 3.1 (L) 03/13/2020   HGB 13.2 03/13/2020   HCT 38.9 03/13/2020   MCV 95.1 03/13/2020   PLT 193 03/13/2020   NEUTROABS 1.9 03/13/2020    CMP  Lab Results  Component Value Date   NA 141 02/21/2020   K 3.5 02/21/2020   CL 106 02/21/2020   CO2 28 02/21/2020   GLUCOSE 146 (H) 02/21/2020   BUN 10 02/21/2020   CREATININE 0.71 02/21/2020   CALCIUM 8.9 02/21/2020   PROT 6.4 (L) 02/21/2020   ALBUMIN 3.4 (L) 02/21/2020   AST 16 02/21/2020   ALT 12 02/21/2020   ALKPHOS 44 02/21/2020   BILITOT 0.8 02/21/2020   GFRNONAA >60 02/21/2020   GFRAA >60 02/21/2020    Imaging:  CT CHEST W CONTRAST  Result Date: 04/03/2020 CLINICAL DATA:  74 year old female with history of esophageal cancer status post chemotherapy and radiation therapy. [name] study. EXAM: CT CHEST, ABDOMEN AND PELVIS WITHOUT CONTRAST TECHNIQUE: Multidetector CT imaging of the chest, abdomen and pelvis was performed following the standard protocol without IV contrast. COMPARISON:  PET-CT 01/24/2020. Chest CT 12/30/2019. CT the abdomen and pelvis 12/09/2019. FINDINGS: CT CHEST FINDINGS Cardiovascular: Nonocclusive filling defects within the right descending pulmonary artery extending into proximal segmental sized branches to the right  lower and right middle lobes, compatible with nonobstructive emboli. Heart size is normal. There is no significant pericardial fluid, thickening or pericardial calcification. Aortic atherosclerosis. No definite coronary artery calcifications. Mediastinum/Nodes: No pathologically enlarged mediastinal or hilar lymph nodes. Mild diffuse circumferential thickening in the esophagus most evident in the mid to distal esophagus, best appreciated on axial image 38 of series 2. No axillary lymphadenopathy. Lungs/Pleura: New ill-defined areas of ground-glass attenuation and nodularity in the right lower lobe basal segments, likely to represent evolving postradiation changes. This is best appreciated on axial image 125 of series 4 and axial image 135 of series 4. No other definite suspicious appearing pulmonary nodules or masses are noted. No pleural effusions. Musculoskeletal: Postoperative changes of lumpectomy in the subareolar aspect of the left breast. Levoscoliosis of the thoracolumbar spine. There are no aggressive appearing lytic or blastic lesions noted in the visualized portions of the skeleton. CT ABDOMEN PELVIS FINDINGS Hepatobiliary: No suspicious cystic or solid hepatic lesions. No intra or extrahepatic biliary ductal dilatation. Gallbladder is nearly completely decompressed, but otherwise unremarkable in appearance. Pancreas: No pancreatic mass. No pancreatic ductal dilatation. No pancreatic or peripancreatic fluid collections or inflammatory changes. Spleen: Unremarkable. Adrenals/Urinary Tract: Bilateral kidneys and bilateral adrenal glands are normal in appearance. No hydroureteronephrosis. Urinary bladder is normal in appearance. Stomach/Bowel: Normal appearance of the stomach. No pathologic dilatation of small bowel or colon. The appendix is not confidently identified and may be surgically absent. Regardless, there are no inflammatory  changes noted adjacent to the cecum to suggest the presence of an acute  appendicitis at this time. Vascular/Lymphatic: Aortic atherosclerosis, without evidence of aneurysm or dissection in the abdominal or pelvic vasculature. No lymphadenopathy noted in the abdomen or pelvis. Reproductive: Uterus is heterogeneous in appearance with multiple small lesions measuring up to 2.2 cm in diameter in the left side of the uterine fundus, likely to represent fibroids. Ovaries are a trophic. Other: No significant volume of ascites.  No pneumoperitoneum. Musculoskeletal: There are no aggressive appearing lytic or blastic lesions noted in the visualized portions of the skeleton. IMPRESSION: 1. Nonocclusive pulmonary embolus in right-sided pulmonary arteries, as above. 2. Today's study demonstrates a positive response to therapy with some regression of the esophageal mass which appears grossly smaller to the prior PET-CT on today's examination, and regression of previously noted lymphadenopathy in the mediastinum and upper abdomen. 3. Ill-defined areas of ground-glass attenuation and nodularity in the right lower lobe. This is likely to reflect evolving postradiation changes. Strictly speaking, the possibility of some alveolar hemorrhage in the setting of pulmonary infarction is not entirely excluded, particularly in light of the right-sided pulmonary emboli. 4. Additional incidental findings, as above. Critical Value/emergent results were called by telephone at the time of interpretation on 04/03/2020 at 1:13 pm to nurse Levonne Spiller for Dr. Lanelle Bal , who verbally acknowledged these results. Electronically Signed   By: Vinnie Langton M.D.   On: 04/03/2020 13:13   CT ABDOMEN PELVIS W CONTRAST  Result Date: 04/03/2020 CLINICAL DATA:  74 year old female with history of esophageal cancer status post chemotherapy and radiation therapy. [name] study. EXAM: CT CHEST, ABDOMEN AND PELVIS WITHOUT CONTRAST TECHNIQUE: Multidetector CT imaging of the chest, abdomen and pelvis was performed  following the standard protocol without IV contrast. COMPARISON:  PET-CT 01/24/2020. Chest CT 12/30/2019. CT the abdomen and pelvis 12/09/2019. FINDINGS: CT CHEST FINDINGS Cardiovascular: Nonocclusive filling defects within the right descending pulmonary artery extending into proximal segmental sized branches to the right lower and right middle lobes, compatible with nonobstructive emboli. Heart size is normal. There is no significant pericardial fluid, thickening or pericardial calcification. Aortic atherosclerosis. No definite coronary artery calcifications. Mediastinum/Nodes: No pathologically enlarged mediastinal or hilar lymph nodes. Mild diffuse circumferential thickening in the esophagus most evident in the mid to distal esophagus, best appreciated on axial image 38 of series 2. No axillary lymphadenopathy. Lungs/Pleura: New ill-defined areas of ground-glass attenuation and nodularity in the right lower lobe basal segments, likely to represent evolving postradiation changes. This is best appreciated on axial image 125 of series 4 and axial image 135 of series 4. No other definite suspicious appearing pulmonary nodules or masses are noted. No pleural effusions. Musculoskeletal: Postoperative changes of lumpectomy in the subareolar aspect of the left breast. Levoscoliosis of the thoracolumbar spine. There are no aggressive appearing lytic or blastic lesions noted in the visualized portions of the skeleton. CT ABDOMEN PELVIS FINDINGS Hepatobiliary: No suspicious cystic or solid hepatic lesions. No intra or extrahepatic biliary ductal dilatation. Gallbladder is nearly completely decompressed, but otherwise unremarkable in appearance. Pancreas: No pancreatic mass. No pancreatic ductal dilatation. No pancreatic or peripancreatic fluid collections or inflammatory changes. Spleen: Unremarkable. Adrenals/Urinary Tract: Bilateral kidneys and bilateral adrenal glands are normal in appearance. No hydroureteronephrosis.  Urinary bladder is normal in appearance. Stomach/Bowel: Normal appearance of the stomach. No pathologic dilatation of small bowel or colon. The appendix is not confidently identified and may be surgically absent. Regardless, there are no inflammatory changes noted adjacent to  the cecum to suggest the presence of an acute appendicitis at this time. Vascular/Lymphatic: Aortic atherosclerosis, without evidence of aneurysm or dissection in the abdominal or pelvic vasculature. No lymphadenopathy noted in the abdomen or pelvis. Reproductive: Uterus is heterogeneous in appearance with multiple small lesions measuring up to 2.2 cm in diameter in the left side of the uterine fundus, likely to represent fibroids. Ovaries are a trophic. Other: No significant volume of ascites.  No pneumoperitoneum. Musculoskeletal: There are no aggressive appearing lytic or blastic lesions noted in the visualized portions of the skeleton. IMPRESSION: 1. Nonocclusive pulmonary embolus in right-sided pulmonary arteries, as above. 2. Today's study demonstrates a positive response to therapy with some regression of the esophageal mass which appears grossly smaller to the prior PET-CT on today's examination, and regression of previously noted lymphadenopathy in the mediastinum and upper abdomen. 3. Ill-defined areas of ground-glass attenuation and nodularity in the right lower lobe. This is likely to reflect evolving postradiation changes. Strictly speaking, the possibility of some alveolar hemorrhage in the setting of pulmonary infarction is not entirely excluded, particularly in light of the right-sided pulmonary emboli. 4. Additional incidental findings, as above. Critical Value/emergent results were called by telephone at the time of interpretation on 04/03/2020 at 1:13 pm to nurse Levonne Spiller for Dr. Lanelle Bal , who verbally acknowledged these results. Electronically Signed   By: Vinnie Langton M.D.   On: 04/03/2020 13:13   VAS [place]  LOWER EXTREMITY VENOUS (DVT)  Result Date: 04/04/2020  Lower Venous DVTStudy Other Indications: [place] cancer, recent PE. Comparison Study: no prior Performing Technologist: Abram Sander RVS  Examination Guidelines: A complete evaluation includes B-mode imaging, spectral Doppler, color Doppler, and power Doppler as needed of all accessible portions of each vessel. Bilateral testing is considered an integral part of a complete examination. Limited examinations for reoccurring indications may be performed as noted. The reflux portion of the exam is performed with the patient in reverse Trendelenburg.  +-----+---------------+---------+-----------+----------+--------------+ RIGHTCompressibilityPhasicitySpontaneityPropertiesThrombus Aging +-----+---------------+---------+-----------+----------+--------------+ CFV  Full           Yes      Yes                                 +-----+---------------+---------+-----------+----------+--------------+   +---------+---------------+---------+-----------+----------+--------------+ LEFT     CompressibilityPhasicitySpontaneityPropertiesThrombus Aging +---------+---------------+---------+-----------+----------+--------------+ CFV      Full           Yes      Yes                                 +---------+---------------+---------+-----------+----------+--------------+ SFJ      Full                                                        +---------+---------------+---------+-----------+----------+--------------+ FV Prox  Full                                                        +---------+---------------+---------+-----------+----------+--------------+ FV Mid   Full                                                        +---------+---------------+---------+-----------+----------+--------------+  FV DistalFull                                                         +---------+---------------+---------+-----------+----------+--------------+ PFV      Full                                                        +---------+---------------+---------+-----------+----------+--------------+ POP      Full           Yes      Yes                                 +---------+---------------+---------+-----------+----------+--------------+ PTV      Full                                                        +---------+---------------+---------+-----------+----------+--------------+ PERO     Full                                                        +---------+---------------+---------+-----------+----------+--------------+     Summary: RIGHT: - No evidence of common femoral vein obstruction.  LEFT: - There is no evidence of deep vein thrombosis in the lower extremity.  - No cystic structure found in the popliteal fossa.  *See table(s) above for measurements and observations. Electronically signed by Harold Barban MD on 04/04/2020 at 9:46:53 PM.    Final     Medications: I have reviewed the patient's current medications.   Assessment/Plan: 1. 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 Upper endoscopy 01/14/2020-ulcerated mass in the proximal/mid esophagus at 26-31 cm from the incisors, biopsy confirmed invasive moderately differentiated squamous cell carcinoma, PD-L1 combined positive score 0% ? CT abdomen/pelvis 12/09/2019-uterine fibroids ? CT chest 12/30/2019-normal esophagus, new sclerosis of the medial head of the right clavicle likely degenerative ? PET scan 01/24/2020-long segment of hypermetabolic thickening of the distal esophagus. For hypermetabolic metastatic lymph nodes, 3 in the mediastinum and 1 in the upper abdomen gastrohepatic ligament. ? EUS 02/10/2020-ulcerated mass in the mid esophagus,uT3, 3 suspicious lymph nodes near the primary tumor, malignant appearing lymph node at the gastrohepatic ligament (level  18)-metastatic squamous cell carcinoma ? Radiation 01/31/2020 ? Cycle 1 weekly Taxol/carboplatin 02/01/2020 ? Cycle 2 weekly Taxol/carboplatin 02/08/2020 ? Cycle 3 weekly Taxol/carboplatin 02/15/2020 ? Cycle 4 weekly Taxol/carboplatin 02/22/2020 ? Cycle 5 weekly Taxol/carboplatin 02/29/2020 ? Radiation completed 03/08/2020 ? CTs 04/03/2020-esophageal mass appears grossly smaller, regression of previously noted lymphadenopathy in the mediastinum and upper abdomen, nonocclusive pulmonary embolus right-sided pulmonary arteries 2. Right breast cancer July 1996, [name], [name], right lumpectomy and axillary dissection followed by adjuvant Adriamycin/Taxol chemotherapy-4 cycles, 5 years of tamoxifen, and breast radiation  3.Left breast DCIS January 2011 -1.1 cm, [name],  [name], left breast lumpectomy followed by adjuvant radiation and 5 years of tamoxifen  4.Depression  5.Hypertension  6.Right eye uveitis, macular edema, and corneal edema  7.Asthma  8.  Pulmonary embolus on CT 04/03/2020-lovenox initiated  9.Odynophagia secondary toradiation -resolved      Disposition:  Ms.Severe appears stable.  She is tolerating the Lovenox well.  She will see Dr. Fanny Skates on 04/10/2020 to discuss the indication for surgery and adjuvant immunotherapy.  She will return for an office visit here on 04/12/2020.  I encouraged her to increase her diet as tolerated.  Betsy Coder, MD  04/07/2020  10:44 AM

## 2020-04-10 DIAGNOSIS — Z9221 Personal history of antineoplastic chemotherapy: Secondary | ICD-10-CM | POA: Diagnosis not present

## 2020-04-10 DIAGNOSIS — C159 Malignant neoplasm of esophagus, unspecified: Secondary | ICD-10-CM | POA: Diagnosis not present

## 2020-04-10 DIAGNOSIS — C154 Malignant neoplasm of middle third of esophagus: Secondary | ICD-10-CM | POA: Diagnosis not present

## 2020-04-10 DIAGNOSIS — Z7901 Long term (current) use of anticoagulants: Secondary | ICD-10-CM | POA: Diagnosis not present

## 2020-04-10 DIAGNOSIS — Z923 Personal history of irradiation: Secondary | ICD-10-CM | POA: Diagnosis not present

## 2020-04-11 ENCOUNTER — Telehealth: Payer: Self-pay | Admitting: Internal Medicine

## 2020-04-11 DIAGNOSIS — C159 Malignant neoplasm of esophagus, unspecified: Secondary | ICD-10-CM | POA: Diagnosis not present

## 2020-04-11 DIAGNOSIS — I10 Essential (primary) hypertension: Secondary | ICD-10-CM

## 2020-04-11 DIAGNOSIS — C772 Secondary and unspecified malignant neoplasm of intra-abdominal lymph nodes: Secondary | ICD-10-CM | POA: Diagnosis not present

## 2020-04-11 NOTE — Progress Notes (Signed)
  Chronic Care Management   Note  04/11/2020 Name: ELDENE BLONIGEN MRN: [id number] DOB: Nov 24, 1946  [name] is a 74 y.o. year old female who is a primary care patient of Hoyt Koch, MD. I reached out to [name] by phone today in response to a referral sent by Ms. Jennye Boroughs PCP, Hoyt Koch, MD.   Ms. Armbrust was given information about Chronic Care Management services today including:  1. CCM service includes personalized support from designated clinical staff supervised by her physician, including individualized plan of care and coordination with other care providers 2. 24/7 contact phone numbers for assistance for urgent and routine care needs. 3. Service will only be billed when office clinical staff spend 20 minutes or more in a month to coordinate care. 4. Only one practitioner may furnish and bill the service in a calendar month. 5. The patient may stop CCM services at any time (effective at the end of the month) by phone call to the office staff.   Patient agreed to services and verbal consent obtained.  This note is not being shared with the patient for the following reason: To respect privacy (The patient or proxy has requested that the information not be shared). Follow up plan:   Earney [place] [employer] Scheduler

## 2020-04-12 ENCOUNTER — Telehealth: Payer: Self-pay | Admitting: Oncology

## 2020-04-12 ENCOUNTER — Other Ambulatory Visit: Payer: Self-pay

## 2020-04-12 ENCOUNTER — Inpatient Hospital Stay (HOSPITAL_BASED_OUTPATIENT_CLINIC_OR_DEPARTMENT_OTHER): Payer: PPO | Admitting: Oncology

## 2020-04-12 VITALS — BP 130/85 | HR 102 | Temp 97.5°F | Resp 20 | Ht 63.0 in | Wt 104.2 lb

## 2020-04-12 DIAGNOSIS — C158 Malignant neoplasm of overlapping sites of esophagus: Secondary | ICD-10-CM | POA: Diagnosis not present

## 2020-04-12 DIAGNOSIS — I2699 Other pulmonary embolism without acute cor pulmonale: Secondary | ICD-10-CM | POA: Diagnosis not present

## 2020-04-12 NOTE — Progress Notes (Signed)
[place] OFFICE PROGRESS NOTE   Diagnosis: Esophagus cancer  INTERVAL HISTORY:   Jo Morrison returns as scheduled.  She saw Dr. Fanny Skates and Dr Williemae Natter earlier this week.  She is scheduled for a repeat upper endoscopy on 05/02/2020.  She has mild anterior chest discomfort.  No other complaint.  Minimal dysphagia.  She takes Lovenox daily.  No bleeding.  Objective:  Vital signs in last 24 hours:  Blood pressure 130/85, pulse (!) 102, temperature (!) 97.5 F (36.4 C), temperature source Temporal, resp. rate 20, height 5' 3" (1.6 m), weight 104 lb 3.2 oz (47.3 kg), SpO2 100 %.     Resp: Lungs clear bilaterally Cardio: Regular rate and rhythm Vascular: No leg edema, the left lower leg is slightly larger than the right side  Skin: Resolving radiation rash over the back   Lab Results:  Lab Results  Component Value Date   WBC 3.1 (L) 03/13/2020   HGB 13.2 03/13/2020   HCT 38.9 03/13/2020   MCV 95.1 03/13/2020   PLT 193 03/13/2020   NEUTROABS 1.9 03/13/2020    CMP  Lab Results  Component Value Date   NA 141 02/21/2020   K 3.5 02/21/2020   CL 106 02/21/2020   CO2 28 02/21/2020   GLUCOSE 146 (H) 02/21/2020   BUN 10 02/21/2020   CREATININE 0.71 02/21/2020   CALCIUM 8.9 02/21/2020   PROT 6.4 (L) 02/21/2020   ALBUMIN 3.4 (L) 02/21/2020   AST 16 02/21/2020   ALT 12 02/21/2020   ALKPHOS 44 02/21/2020   BILITOT 0.8 02/21/2020   GFRNONAA >60 02/21/2020   GFRAA >60 02/21/2020    Medications: I have reviewed the patient's current medications.   Assessment/Plan: 1. 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 Upper endoscopy 01/14/2020-ulcerated mass in the proximal/mid esophagus at 26-31 cm from the incisors, biopsy confirmed invasive moderately differentiated squamous cell carcinoma, PD-L1 combined positive score 0% ? CT abdomen/pelvis 12/09/2019-uterine fibroids ? CT chest 12/30/2019-normal esophagus, new sclerosis of the medial head of  the right clavicle likely degenerative ? PET scan 01/24/2020-long segment of hypermetabolic thickening of the distal esophagus. For hypermetabolic metastatic lymph nodes, 3 in the mediastinum and 1 in the upper abdomen gastrohepatic ligament. ? EUS 02/10/2020-ulcerated mass in the mid esophagus,uT3, 3 suspicious lymph nodes near the primary tumor, malignant appearing lymph node at the gastrohepatic ligament (level 18)-metastatic squamous cell carcinoma ? Radiation 01/31/2020 ? Cycle 1 weekly Taxol/carboplatin 02/01/2020 ? Cycle 2 weekly Taxol/carboplatin 02/08/2020 ? Cycle 3 weekly Taxol/carboplatin 02/15/2020 ? Cycle 4 weekly Taxol/carboplatin 02/22/2020 ? Cycle 5 weekly Taxol/carboplatin 02/29/2020 ? Radiation completed 03/08/2020 ? CTs 04/03/2020-esophageal mass appears grossly smaller, regression of previously noted lymphadenopathy in the mediastinum and upper abdomen, nonocclusive pulmonary embolus right-sided pulmonary arteries 2. 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  Pulmonary embolus on CT 04/03/2020-lovenox initiated  9.Odynophagia secondary toradiation -resolved   Disposition:  Jo Morrison appears stable.  She continues Lovenox anticoagulation for treatment of the pulmonary embolism.  She was seen at [facility] earlier this week.  The plan is to complete a restaging endoscopy on 05/02/2020.  She will enter a  surveillance program if no tumor is found.  She will consider surgery if there is residual disease.  I encouraged her to increase her diet and activity level as tolerated.   She will return for an office visit here on 05/05/2020.  Jo Coder, MD  04/12/2020  11:49 AM

## 2020-04-12 NOTE — Telephone Encounter (Signed)
Scheduled per 5/19 los. Pt mychart active. Pt aware of appt.

## 2020-04-19 ENCOUNTER — Encounter: Payer: Self-pay | Admitting: Oncology

## 2020-04-27 ENCOUNTER — Ambulatory Visit: Payer: PPO | Admitting: Physician Assistant

## 2020-05-01 DIAGNOSIS — I517 Cardiomegaly: Secondary | ICD-10-CM | POA: Diagnosis not present

## 2020-05-01 DIAGNOSIS — Z01818 Encounter for other preprocedural examination: Secondary | ICD-10-CM | POA: Diagnosis not present

## 2020-05-01 DIAGNOSIS — C50511 Malignant neoplasm of lower-outer quadrant of right female breast: Secondary | ICD-10-CM | POA: Diagnosis not present

## 2020-05-01 DIAGNOSIS — Z9221 Personal history of antineoplastic chemotherapy: Secondary | ICD-10-CM | POA: Diagnosis not present

## 2020-05-01 DIAGNOSIS — Z20822 Contact with and (suspected) exposure to covid-19: Secondary | ICD-10-CM | POA: Diagnosis not present

## 2020-05-01 DIAGNOSIS — R9431 Abnormal electrocardiogram [ECG] [EKG]: Secondary | ICD-10-CM | POA: Diagnosis not present

## 2020-05-01 DIAGNOSIS — Z86711 Personal history of pulmonary embolism: Secondary | ICD-10-CM | POA: Diagnosis not present

## 2020-05-01 DIAGNOSIS — Z7901 Long term (current) use of anticoagulants: Secondary | ICD-10-CM | POA: Diagnosis not present

## 2020-05-01 DIAGNOSIS — I491 Atrial premature depolarization: Secondary | ICD-10-CM | POA: Diagnosis not present

## 2020-05-01 DIAGNOSIS — J45909 Unspecified asthma, uncomplicated: Secondary | ICD-10-CM | POA: Diagnosis not present

## 2020-05-01 DIAGNOSIS — C154 Malignant neoplasm of middle third of esophagus: Secondary | ICD-10-CM | POA: Diagnosis not present

## 2020-05-01 DIAGNOSIS — F419 Anxiety disorder, unspecified: Secondary | ICD-10-CM | POA: Diagnosis not present

## 2020-05-01 DIAGNOSIS — C159 Malignant neoplasm of esophagus, unspecified: Secondary | ICD-10-CM | POA: Diagnosis not present

## 2020-05-01 DIAGNOSIS — Z923 Personal history of irradiation: Secondary | ICD-10-CM | POA: Diagnosis not present

## 2020-05-01 DIAGNOSIS — Z6740 Type O blood, Rh positive: Secondary | ICD-10-CM | POA: Diagnosis not present

## 2020-05-01 NOTE — Progress Notes (Signed)
Hinton Dyer NP with [facility] calls to make sure okay with Dr. Benay Spice for patient to hold Lovenox for EGD/Biopsy tomorrow.  I consulted with Dr. Benay Spice and okay to hold resume after procedure.  Called Hinton Dyer back to let her know at [phone]

## 2020-05-02 DIAGNOSIS — Z7901 Long term (current) use of anticoagulants: Secondary | ICD-10-CM | POA: Diagnosis not present

## 2020-05-02 DIAGNOSIS — K208 Other esophagitis without bleeding: Secondary | ICD-10-CM | POA: Diagnosis not present

## 2020-05-02 DIAGNOSIS — Z86711 Personal history of pulmonary embolism: Secondary | ICD-10-CM | POA: Diagnosis not present

## 2020-05-02 DIAGNOSIS — Z79899 Other long term (current) drug therapy: Secondary | ICD-10-CM | POA: Diagnosis not present

## 2020-05-02 DIAGNOSIS — C159 Malignant neoplasm of esophagus, unspecified: Secondary | ICD-10-CM | POA: Diagnosis not present

## 2020-05-02 DIAGNOSIS — Z923 Personal history of irradiation: Secondary | ICD-10-CM | POA: Diagnosis not present

## 2020-05-02 DIAGNOSIS — R05 Cough: Secondary | ICD-10-CM | POA: Diagnosis not present

## 2020-05-02 DIAGNOSIS — K2210 Ulcer of esophagus without bleeding: Secondary | ICD-10-CM | POA: Diagnosis not present

## 2020-05-02 DIAGNOSIS — Z7951 Long term (current) use of inhaled steroids: Secondary | ICD-10-CM | POA: Diagnosis not present

## 2020-05-02 DIAGNOSIS — Z8501 Personal history of malignant neoplasm of esophagus: Secondary | ICD-10-CM | POA: Diagnosis not present

## 2020-05-02 DIAGNOSIS — I10 Essential (primary) hypertension: Secondary | ICD-10-CM | POA: Diagnosis not present

## 2020-05-02 DIAGNOSIS — Z9221 Personal history of antineoplastic chemotherapy: Secondary | ICD-10-CM | POA: Diagnosis not present

## 2020-05-02 DIAGNOSIS — J45909 Unspecified asthma, uncomplicated: Secondary | ICD-10-CM | POA: Diagnosis not present

## 2020-05-02 DIAGNOSIS — Z853 Personal history of malignant neoplasm of breast: Secondary | ICD-10-CM | POA: Diagnosis not present

## 2020-05-03 NOTE — Progress Notes (Signed)
Patient's husband Ron contacted me letting me know that biopsies from [facility] will not be back until sometime beginning of next week.  Cancelling her appointment for 6/11 and r/s to 6/18 per his request.

## 2020-05-05 ENCOUNTER — Ambulatory Visit: Payer: PPO | Admitting: Nurse Practitioner

## 2020-05-12 ENCOUNTER — Encounter: Payer: Self-pay | Admitting: Nurse Practitioner

## 2020-05-12 ENCOUNTER — Other Ambulatory Visit: Payer: Self-pay | Admitting: Nurse Practitioner

## 2020-05-12 ENCOUNTER — Other Ambulatory Visit: Payer: Self-pay

## 2020-05-12 ENCOUNTER — Inpatient Hospital Stay: Payer: PPO | Attending: Oncology | Admitting: Nurse Practitioner

## 2020-05-12 VITALS — BP 132/84 | HR 103 | Temp 97.5°F | Resp 17 | Ht 63.0 in | Wt 101.9 lb

## 2020-05-12 DIAGNOSIS — I10 Essential (primary) hypertension: Secondary | ICD-10-CM | POA: Insufficient documentation

## 2020-05-12 DIAGNOSIS — C779 Secondary and unspecified malignant neoplasm of lymph node, unspecified: Secondary | ICD-10-CM | POA: Diagnosis not present

## 2020-05-12 DIAGNOSIS — Z9221 Personal history of antineoplastic chemotherapy: Secondary | ICD-10-CM | POA: Diagnosis not present

## 2020-05-12 DIAGNOSIS — F329 Major depressive disorder, single episode, unspecified: Secondary | ICD-10-CM | POA: Diagnosis not present

## 2020-05-12 DIAGNOSIS — Z853 Personal history of malignant neoplasm of breast: Secondary | ICD-10-CM | POA: Diagnosis not present

## 2020-05-12 DIAGNOSIS — C158 Malignant neoplasm of overlapping sites of esophagus: Secondary | ICD-10-CM | POA: Diagnosis not present

## 2020-05-12 DIAGNOSIS — Z86711 Personal history of pulmonary embolism: Secondary | ICD-10-CM | POA: Insufficient documentation

## 2020-05-12 DIAGNOSIS — D259 Leiomyoma of uterus, unspecified: Secondary | ICD-10-CM | POA: Insufficient documentation

## 2020-05-12 DIAGNOSIS — C154 Malignant neoplasm of middle third of esophagus: Secondary | ICD-10-CM | POA: Insufficient documentation

## 2020-05-12 DIAGNOSIS — Z923 Personal history of irradiation: Secondary | ICD-10-CM | POA: Diagnosis not present

## 2020-05-12 DIAGNOSIS — J45909 Unspecified asthma, uncomplicated: Secondary | ICD-10-CM | POA: Insufficient documentation

## 2020-05-12 MED ORDER — POTASSIUM CHLORIDE ER 10 MEQ PO CPCR: 10.0000 meq | ORAL_CAPSULE | Freq: Every day | ORAL | 0 refills | Status: DC

## 2020-05-12 NOTE — Progress Notes (Addendum)
[facility] OFFICE PROGRESS NOTE   Diagnosis: Esophagus cancer  INTERVAL HISTORY:   Jo Morrison returns as scheduled.  She feels well.  She had an episode of dysphagia after eating pork chops recently.  Otherwise she has been tolerating a regular diet without difficulty.  She has a good appetite.  No diarrhea.  Objective:  Vital signs in last 24 hours:  Blood pressure 132/84, pulse (!) 103, temperature (!) 97.5 F (36.4 C), temperature source Temporal, resp. rate 17, height _0  (1.6 m), weight 101 lb 14.4 oz (46.2 kg), SpO2 100 %.    Lymphatics: No palpable cervical, supraclavicular or axillary lymph nodes. Resp: Lungs clear bilaterally. Cardio: Regular rate and rhythm. GI: Abdomen soft and nontender.  No hepatomegaly. Vascular: No leg edema.   Lab Results:  Lab Results  Component Value Date   WBC 3.1 (L) 03/13/2020   HGB 13.2 03/13/2020   HCT 38.9 03/13/2020   MCV 95.1 03/13/2020   PLT 193 03/13/2020   NEUTROABS 1.9 03/13/2020    Imaging:  No results found.  Medications: I have reviewed the patient's current medications.  Assessment/Plan: 1. 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 Upper endoscopy 01/14/2020-ulcerated mass in the proximal/mid esophagus at 26-31 cm from the incisors, biopsy confirmed invasive moderately differentiated squamous cell carcinoma, PD-L1 combined positive score 0% ? CT abdomen/pelvis 12/09/2019-uterine fibroids ? CT chest 12/30/2019-normal esophagus, new sclerosis of the medial head of the right clavicle likely degenerative ? PET scan 01/24/2020-long segment of hypermetabolic thickening of the distal esophagus. For hypermetabolic metastatic lymph nodes, 3 in the mediastinum and 1 in the upper abdomen gastrohepatic ligament. ? EUS 02/10/2020-ulcerated mass in the mid esophagus,uT3, 3 suspicious lymph nodes near the primary tumor, malignant appearing lymph node at the gastrohepatic ligament (level  18)-metastatic squamous cell carcinoma ? Radiation 01/31/2020 ? Cycle 1 weekly Taxol/carboplatin 02/01/2020 ? Cycle 2 weekly Taxol/carboplatin 02/08/2020 ? Cycle 3 weekly Taxol/carboplatin 02/15/2020 ? Cycle 4 weekly Taxol/carboplatin 02/22/2020 ? Cycle 5 weekly Taxol/carboplatin 02/29/2020 ? Radiation completed 03/08/2020 ? CTs 04/03/2020-esophageal mass appears grossly smaller, regression of previously noted lymphadenopathy in the mediastinum and upper abdomen, nonocclusive pulmonary embolus right-sided pulmonary arteries ? Upper endoscopy 05/02/2020-treatment effect visible starting approximately 26 cm from the incisors.  Scope passed easily into the stomach and proximal duodenum.  Stomach and proximal duodenum grossly normal.  Scope withdrawn to the mid esophagus with four-quadrant biopsies obtained.  2 biopsy showed acute ulcerative esophagitis; 2 biopsies showed squamous mucosa with minimal histologic abnormality. 2. 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  Pulmonary embolus on CT 04/03/2020-lovenox initiated  9.Odynophagia secondary toradiation-resolved   Disposition: Jo Morrison appears well.  The recent EGD with biopsies shows no evidence of residual cancer.  She will follow-up at [facility] in late August with another EGD and scans plan.  She was noted to have hypokalemia on labs from 05/01/2020.  This is likely related to hydrochlorothiazide.  She will begin Micro-K 10 mEq daily.  She will follow-up with her PCP for further lab monitoring.  She will return for an appointment here  the week of 07/31/2020.  We are  available to see her sooner if needed.  She will contact the office in the interim with any problems.  Patient seen with Dr. Benay Spice.    Ned Card ANP/GNP-BC   05/12/2020  12:22 PM Jo Morrison appears well.  The repeat upper endoscopy at [facility] revealed no evidence of residual esophagus cancer.  She is in clinical remission.  She will undergo another surveillance endoscopy and CTs in late August.  She continues Lovenox anticoagulation.  I recommend at least 6 months of anticoagulation therapy.  Jo Manson, MD

## 2020-05-15 ENCOUNTER — Telehealth: Payer: Self-pay | Admitting: Nurse Practitioner

## 2020-05-15 NOTE — Telephone Encounter (Signed)
Scheduled appts per 6/18 los. Pt confirmed appt date and time.

## 2020-06-09 NOTE — Chronic Care Management (AMB) (Deleted)
Chronic Care Management Pharmacy  Name: Jo Morrison  MRN: [id number] DOB: [birth year]   Chief Complaint/ HPI  Jo Morrison,  74 y.o. , female presents for their Initial CCM visit with the clinical pharmacist via telephone due to COVID-19 Pandemic.  PCP : Hoyt Koch, MD Patient Care Team: Hoyt Koch, MD as PCP - General (Internal Medicine) Nicholas Lose, MD as Consulting Physician (Hematology and Oncology) Gatha Mayer, MD as Consulting Physician (Gastroenterology) Jonnie Finner, RN as Oncology Nurse Navigator Charlton Haws, [facility] as Pharmacist (Pharmacist)  Their chronic conditions include: Hypertension, Asthma, Anxiety, Osteoarthritis and Primary squamous cell carcinoma    Office Visits: 09/17/19: Patient presented to Dr. Sharlet Salina for follow-up.   Consult Visit: 05/01/20: Patient presented to Dr. Williemae Natter (oncology) for evaluation prior to surgery. Chemoradiation completed 02/29/20, pulmonary embolus 04/03/20. 04/12/20: Patient presented to Dr. Ammie Dalton (oncology) for follow-up. Restaging endoscopy scheduled for 05/02/20. Vitamin B, vitamin d, multivitamin discontinued (patient not taking)  01/26/20: Patient presented to Donnal Moat, PA-C for recurrent major depression.  01/17/20: Patient presented to Dr. Roby Lofts (Ophthalmology) for macular edema   Allergies  Allergen Reactions  . Latex Shortness Of Breath    Medications: Outpatient Encounter Medications as of 06/12/2020  Medication Sig Note  . Albuterol Sulfate (PROAIR RESPICLICK) 267 (90 BASE) MCG/ACT AEPB Inhale 2 puffs into the lungs every 4 (four) hours as needed (SOB).   . Bromfenac Sodium (PROLENSA) 0.07 % SOLN Place 1 drop into the right eye 4 (four) times daily. (Patient taking differently: Place 1 drop into the right eye daily in the afternoon. ) 02/21/2020: Three times/day  . budesonide (PULMICORT) 180 MCG/ACT inhaler Inhale 2 puffs into the lungs daily as needed (Shortness of breath).    .  chlorpheniramine-HYDROcodone (TUSSIONEX) 10-8 MG/5ML SUER Take 5 mLs by mouth every 12 (twelve) hours as needed for cough.   . clonazePAM (KLONOPIN) 0.5 MG tablet Take 1 tablet (0.5 mg total) by mouth at bedtime as needed.   . dorzolamide-timolol (COSOPT) 22.3-6.8 MG/ML ophthalmic solution Place 1 drop into the right eye 2 (two) times daily.    Marland Kitchen enoxaparin (LOVENOX) 80 MG/0.8ML injection Inject 0.8 mLs (80 mg total) into the skin daily.   . fluticasone (FLONASE) 50 MCG/ACT nasal spray Place 2 sprays into the nose daily as needed for allergies.    Marland Kitchen gabapentin (NEURONTIN) 100 MG capsule Take 100 mg by mouth at bedtime.    . hydrochlorothiazide (HYDRODIURIL) 25 MG tablet TAKE 1 TABLET(25 MG) BY MOUTH DAILY   . loteprednol (LOTEMAX) 0.5 % ophthalmic suspension Place 1 drop into the right eye 4 (four) times daily.   . methocarbamol (ROBAXIN) 500 MG tablet Take 1 tablet (500 mg total) by mouth every 8 (eight) hours as needed for muscle spasms.   . montelukast (SINGULAIR) 10 MG tablet TAKE 1 TABLET(10 MG) BY MOUTH AT BEDTIME (Patient taking differently: Take 10 mg by mouth at bedtime. )   . oxycodone (OXY-IR) 5 MG capsule Take 5 mg by mouth at bedtime as needed for pain.   . pantoprazole (PROTONIX) 40 MG tablet Take 40 mg by mouth daily.   . potassium chloride (MICRO-K) 10 MEQ CR capsule Take 1 capsule (10 mEq total) by mouth daily.   . prochlorperazine (COMPAZINE) 10 MG tablet Take 1 tablet (10 mg total) by mouth every 6 (six) hours as needed.   Marland Kitchen REFRESH OPTIVE ADVANCED 0.5-1-0.5 % SOLN Place 1 drop into both eyes daily as needed (itching Eyes).    Marland Kitchen  sodium chloride (MURO 128) 2 % ophthalmic solution Place 1 drop into the right eye 4 (four) times daily.   . sodium chloride (MURO 128) 5 % ophthalmic ointment Place 1 application into the right eye at bedtime.   . sucralfate (CARAFATE) 1 g tablet Take 1 tablet (1 g total) by mouth 4 (four) times daily. Dissolve each tablet in 15 cc water before use.   .  traZODone (DESYREL) 100 MG tablet TAKE 1 TO 2 TABLETS(100 TO 200 MG) BY MOUTH AT BEDTIME AS NEEDED FOR SLEEP (Patient taking differently: Take 150 mg by mouth at bedtime. )   . triamcinolone cream (KENALOG) 0.1 % Apply 1 application topically 2 (two) times daily.   . valACYclovir (VALTREX) 1000 MG tablet Take 1,000 mg by mouth daily.    Marland Kitchen venlafaxine XR (EFFEXOR-XR) 150 MG 24 hr capsule Take 1 capsule (150 mg total) by mouth every morning. (Patient taking differently: Take 150 mg by mouth daily. )    No facility-administered encounter medications on file as of 06/12/2020.     Current Diagnosis/Assessment:    Goals Addressed   None    Asthma    Last spirometry score: ***  Eosinophil count:   Lab Results  Component Value Date/Time   EOSPCT 1 03/13/2020 11:53 AM   EOSPCT 1.7 11/06/2015 10:39 AM  %                               Eos (Absolute):  Lab Results  Component Value Date/Time   EOSABS 0.0 03/13/2020 11:53 AM   EOSABS 0.2 11/06/2015 10:39 AM    Tobacco Status:  Social History   Tobacco Use  Smoking Status Never Smoker  Smokeless Tobacco Never Used    Patient has failed these meds in past: *** Patient is currently {CHL Controlled/[phone]} on the following medications: *** . ProAir Respiclick 366 mcg/act 2 puff q4hr PRN  . Pulmicort 180 mcg/act 2 puff daily PRN  . Montelukast 10 mg QHS   Using maintenance inhaler regularly? {yes/no:20286} Frequency of rescue inhaler use:  {CHL HP Upstream Pharm Inhaler QHUT:[id number]}  We discussed:  {CHL HP Upstream Pharmacy discussion:[phone]}  Plan  Continue {CHL HP Upstream Pharmacy Plans:[phone]}  Hypertension   BP goal is:  {CHL HP UPSTREAM Pharmacist BP [phone]}  Office blood pressures are  BP Readings from Last 3 Encounters:  05/12/20 132/84  04/12/20 130/85  04/07/20 (!) 144/85   Patient checks BP at home {CHL HP BP Monitoring Frequency:[phone]} Patient home BP readings  are ranging: ***  Patient has failed these meds in the past: *** Patient is currently {CHL Controlled/[phone]} on the following medications:  . HCTZ 25 mg daily   We discussed {CHL HP Upstream Pharmacy discussion:[phone]}  Plan  Continue {CHL HP Upstream Pharmacy Plans:[phone]}   Depression / Anxiety   PHQ9 Score:  PHQ9 SCORE ONLY 09/24/2019 10/08/2017 07/08/2017  PHQ-9 Total Score 0 0 0   GAD7 Score: GAD 7 : Generalized Anxiety Score 09/24/2019  Nervous, Anxious, on Edge 0  Control/stop worrying 0  Worry too much - different things 0  Trouble relaxing 1  Restless 0  Easily annoyed or irritable 0  Afraid - awful might happen 0  Total GAD 7 Score 1    Patient has failed these meds in past: *** Patient is currently {CHL Controlled/[phone]} on the following medications:  . Clonazepam 0.5 mg QHS PRN  . Venlafaxine XR 150 mg daily  We discussed:  ***  Plan  Continue {CHL HP Upstream Pharmacy [phone]}  Chronic Pain   Patient has failed these meds in past: *** Patient is currently {CHL Controlled/Uncontrolled:[phone]} on the following medications:  . Gabapentin 100 mg QHS  . Methocarbamol 500 mg q8hr PRN  . Oxycodone 5 mg QHS PRN    We discussed:  ***  Plan  Continue {CHL HP Upstream Pharmacy [phone]}  GERD   Patient has failed these meds in past: *** Patient is currently {CHL Controlled/Uncontrolled:[phone]} on the following medications:  . Pantoprazole 40 mg daily   We discussed:  ***  Plan  Continue {CHL HP Upstream Pharmacy VFIEP:[id number]}  Macular Edema Right Eye    Patient has failed these meds in past: *** Patient is currently {CHL Controlled/Uncontrolled:[phone]} on the following medications:  . Prolensa 0.07% 1 drop right eye QID  . Cosopt 22.3-6.8 mg/mL 1 drop right eye BID  . Lotemax 0.5% ophth 1 drop right eye QID  . Refresh eye drop PRN  . NaCl 2% solution 1 drop right  eye QID  . NaCl 5% ointment 1 application right eye QHS  . Valacyclovir 1000 mg daily   We discussed:  ***  Plan  Continue {CHL HP Upstream Pharmacy [phone]}    Misc / OTC    Patient has failed these meds in past: *** Patient is currently {CHL Controlled/Uncontrolled:[phone]} on the following medications:  . Tussionex 10-8 mg  . Lovenox 80 mg daily  . Flonase 50 mcg/act 2 spray daily PRN  . Potassium Chloride 10 mEq daily  . Prochlorperazine 10 mg q6hr PRN  . Sucralfate 1 g QID  . Trazodone 100-200 mg QHS PRN sleep  . Triamcinolone 0.1% cream  We discussed:  ***  Plan  Continue {CHL HP Upstream Pharmacy SAYTK:[id number]}   Vaccines   Reviewed and discussed patient's vaccination history.    Immunization History  Administered Date(s) Administered  . Influenza, High Dose Seasonal PF 08/09/2016, 09/09/2017, 09/07/2018, 08/01/2019  . Influenza,inj,Quad PF,6+ Mos 08/10/2015  . Influenza-Unspecified 08/25/2010, 09/15/2012, 08/01/2019  . PFIZER SARS-COV-2 Vaccination 12/05/2019, 12/26/2019  . Pneumococcal Conjugate-13 12/13/2014  . Pneumococcal Polysaccharide-23 06/16/2013  . Tdap 11/25/1998, 08/10/2015  . Zoster 05/27/2012  . Zoster Recombinat (Shingrix) 06/22/2017, 09/10/2017    Plan  Recommended patient receive *** vaccine in *** office.   Medication Management   Pt uses [facility] for all medications Uses pill box? {Yes or If no, why not?:20788} Pt endorses ***% compliance  We discussed: ***  Plan  {US Pharmacy TDDU:20254}    Follow up: *** month phone visit  ***

## 2020-06-12 ENCOUNTER — Ambulatory Visit: Payer: PPO | Admitting: Pharmacist

## 2020-06-12 ENCOUNTER — Other Ambulatory Visit: Payer: Self-pay

## 2020-06-12 DIAGNOSIS — I10 Essential (primary) hypertension: Secondary | ICD-10-CM

## 2020-06-12 DIAGNOSIS — C158 Malignant neoplasm of overlapping sites of esophagus: Secondary | ICD-10-CM

## 2020-06-12 NOTE — Patient Instructions (Addendum)
Visit Information  Phone number for Pharmacist: [phone]  Thank you for meeting with me to discuss your medications! I look forward to working with you to achieve your health care goals. Below is a summary of what we talked about during the visit:  Goals Addressed            This Visit's Progress   . Pharmacy Care Plan       CARE PLAN ENTRY (see longitudinal plan of care for additional care plan information)  Current Barriers:  . Chronic Disease Management support, education, and care coordination needs related to Hypertension, Pulmonary embolus   Hypertension BP Readings from Last 3 Encounters:  05/12/20 132/84  04/12/20 130/85  04/07/20 (!) 144/85 .  Pharmacist Clinical Goal(s): o Over the next 180 days, patient will work with PharmD and providers to maintain BP goal <140/90 . Current regimen:  o HCTZ 25 mg daily  . Interventions: o Discussed BP goals and benefits of medication for prevention of heart attack / stroke . Patient self care activities - Over the next 180 days, patient will: o Check BP 1-2 times weekly, document, and provide at future appointments o Ensure daily salt intake < 2300 mg/day  Pulmonary embolus . Pharmacist Clinical Goal(s) o Over the next 180 days, patient will work with PharmD and providers to optimize therapy . Current regimen:  o Lovenox 80 mg injection daily . Interventions: o Recommend to discuss oral options with oncologist (Xarelto, Eliquis, Savaysa have all been studied in cancer patients) . Patient self care activities - Over the next 180 days, patient will: o Schedule appt with oncologist to discuss oral options for PE treatment  Medication management . Pharmacist Clinical Goal(s): o Over the next 180 days, patient will work with PharmD and providers to maintain optimal medication adherence . Current pharmacy: [facility] . Interventions o Comprehensive medication review performed. o Continue current medication management  strategy . Patient self care activities - Over the next 180 days, patient will: o Focus on medication adherence by fill date o Take medications as prescribed o Report any questions or concerns to PharmD and/or provider(s)  Initial goal documentation       Jo Morrison was given information about Chronic Care Management services today including:  1. CCM service includes personalized support from designated clinical staff supervised by her physician, including individualized plan of care and coordination with other care providers 2. 24/7 contact phone numbers for assistance for urgent and routine care needs. 3. Standard insurance, coinsurance, copays and deductibles apply for chronic care management only during months in which we provide at least 20 minutes of these services. Most insurances cover these services at 100%, however patients may be responsible for any copay, coinsurance and/or deductible if applicable. This service may help you avoid the need for more expensive face-to-face services. 4. Only one practitioner may furnish and bill the service in a calendar month. 5. The patient may stop CCM services at any time (effective at the end of the month) by phone call to the office staff.  Patient agreed to services and verbal consent obtained.   Patient verbalizes understanding of instructions provided today.  Telephone follow up appointment with pharmacy team member scheduled for: 6 months  Charlene Brooke, PharmD Clinical Pharmacist [facility] [phone]  Hypokalemia Hypokalemia means that the amount of potassium in the blood is lower than normal. Potassium is a chemical (electrolyte) that helps regulate the amount of fluid in the body. It also stimulates  muscle tightening (contraction) and helps nerves work properly. Normally, most of the body's potassium is inside cells, and only a very small amount is in the blood. Because the amount in the blood is so  small, minor changes to potassium levels in the blood can be life-threatening. What are the causes? This condition may be caused by:  Antibiotic medicine.  Diarrhea or vomiting. Taking too much of a medicine that helps you have a bowel movement (laxative) can cause diarrhea and lead to hypokalemia.  Chronic kidney disease (CKD).  Medicines that help the body get rid of excess fluid (diuretics).  Eating disorders, such as bulimia.  Low magnesium levels in the body.  Sweating a lot. What are the signs or symptoms? Symptoms of this condition include:  Weakness.  Constipation.  Fatigue.  Muscle cramps.  Mental confusion.  Skipped heartbeats or irregular heartbeat (palpitations).  Tingling or numbness. How is this diagnosed? This condition is diagnosed with a blood test. How is this treated? This condition may be treated by:  Taking potassium supplements by mouth.  Adjusting the medicines that you take.  Eating more foods that contain a lot of potassium. If your potassium level is very low, you may need to get potassium through an IV and be monitored in the hospital. Follow these instructions at home:   Take over-the-counter and prescription medicines only as told by your health care provider. This includes vitamins and supplements.  Eat a healthy diet. A healthy diet includes fresh fruits and vegetables, whole grains, healthy fats, and lean proteins.  If instructed, eat more foods that contain a lot of potassium. This includes: ? Nuts, such as peanuts and pistachios. ? Seeds, such as sunflower seeds and pumpkin seeds. ? Peas, lentils, and lima beans. ? Whole grain and bran cereals and breads. ? Fresh fruits and vegetables, such as apricots, avocado, bananas, cantaloupe, kiwi, oranges, tomatoes, asparagus, and potatoes. ? Orange juice. ? Tomato juice. ? Red meats. ? Yogurt.  Keep all follow-up visits as told by your health care provider. This is  important. Contact a health care provider if you:  Have weakness that gets worse.  Feel your heart pounding or racing.  Vomit.  Have diarrhea.  Have diabetes (diabetes mellitus) and you have trouble keeping your blood sugar (glucose) in your target range. Get help right away if you:  Have chest pain.  Have shortness of breath.  Have vomiting or diarrhea that lasts for more than 2 days.  Faint. Summary  Hypokalemia means that the amount of potassium in the blood is lower than normal.  This condition is diagnosed with a blood test.  Hypokalemia may be treated by taking potassium supplements, adjusting the medicines that you take, or eating more foods that are high in potassium.  If your potassium level is very low, you may need to get potassium through an IV and be monitored in the hospital. This information is not intended to replace advice given to you by your health care provider. Make sure you discuss any questions you have with your health care provider. Document Revised: 06/24/2018 Document Reviewed: 06/24/2018 Elsevier Patient Education  [place].

## 2020-06-12 NOTE — Chronic Care Management (AMB) (Signed)
Chronic Care Management Pharmacy  Name: Jo Morrison  MRN: [id number] DOB: 1946-05-19   Chief Complaint/ HPI  Jo Morrison,  75 y.o. , female presents for their Initial CCM visit with the clinical pharmacist via telephone due to COVID-19 Pandemic.  PCP : Hoyt Koch, MD Patient Care Team: Hoyt Koch, MD as PCP - General (Internal Medicine) Nicholas Lose, MD as Consulting Physician (Hematology and Oncology) Gatha Mayer, MD as Consulting Physician (Gastroenterology) Jonnie Finner, RN as Oncology Nurse Navigator Charlton Haws, [facility] as Pharmacist (Pharmacist)  Their chronic conditions include: Hypertension, Asthma, Anxiety, Osteoarthritis and Primary squamous cell carcinoma of esophagus, hx breast cancer (1996, 2011)  From [place], worked for [employer] and moved around a lot - [place], [place]. Enjoys gardening, cooking.   Office Visits: 09/17/19: Patient presented to Dr. Sharlet Salina for follow-up.   Consult Visit: 05/12/20 NP Ned Card (heme/onc): esohageal SCC diagnosed 12/2019. Radiation/chemo Mar-April 2021. EGD show no residual cancer. HypoK likely d/t HCTZ, start KCL 10 mEq. 05/01/20: Patient presented to Dr. Williemae Natter (oncology) for evaluation prior to surgery. Chemoradiation completed 02/29/20, pulmonary embolus 04/03/20 - initiated Lovonex. 04/12/20: Patient presented to Dr. Ammie Dalton (oncology) for follow-up. Restaging endoscopy scheduled for 05/02/20. Vitamin B, vitamin d, multivitamin discontinued (patient not taking)  01/26/20: Patient presented to Donnal Moat, PA-C for recurrent major depression.  01/17/20: Patient presented to Dr. Roby Lofts (Ophthalmology) for macular edema   Allergies  Allergen Reactions  . Latex Shortness Of Breath    Medications: Outpatient Encounter Medications as of 06/12/2020  Medication Sig Note  . Albuterol Sulfate (PROAIR RESPICLICK) 478 (90 BASE) MCG/ACT AEPB Inhale 2 puffs into the lungs every 4 (four) hours as needed (SOB).   .  Bromfenac Sodium (PROLENSA) 0.07 % SOLN Place 1 drop into the right eye 4 (four) times daily. (Patient taking differently: Place 1 drop into the right eye daily in the afternoon. ) 02/21/2020: Three times/day  . chlorpheniramine-HYDROcodone (TUSSIONEX) 10-8 MG/5ML SUER Take 5 mLs by mouth every 12 (twelve) hours as needed for cough.   . clonazePAM (KLONOPIN) 0.5 MG tablet Take 1 tablet (0.5 mg total) by mouth at bedtime as needed.   . dorzolamide-timolol (COSOPT) 22.3-6.8 MG/ML ophthalmic solution Place 1 drop into the right eye 2 (two) times daily.    Marland Kitchen enoxaparin (LOVENOX) 80 MG/0.8ML injection Inject 0.8 mLs (80 mg total) into the skin daily.   . fluticasone (FLONASE) 50 MCG/ACT nasal spray Place 2 sprays into the nose daily as needed for allergies.    Marland Kitchen gabapentin (NEURONTIN) 100 MG capsule Take 100 mg by mouth at bedtime.    . hydrochlorothiazide (HYDRODIURIL) 25 MG tablet TAKE 1 TABLET(25 MG) BY MOUTH DAILY   . loteprednol (LOTEMAX) 0.5 % ophthalmic suspension Place 1 drop into the right eye 4 (four) times daily.   Marland Kitchen REFRESH OPTIVE ADVANCED 0.5-1-0.5 % SOLN Place 1 drop into both eyes daily as needed (itching Eyes).    . sodium chloride (MURO 128) 2 % ophthalmic solution Place 1 drop into the right eye 4 (four) times daily.   . traZODone (DESYREL) 100 MG tablet TAKE 1 TO 2 TABLETS(100 TO 200 MG) BY MOUTH AT BEDTIME AS NEEDED FOR SLEEP (Patient taking differently: Take 150 mg by mouth at bedtime. )   . venlafaxine XR (EFFEXOR-XR) 150 MG 24 hr capsule Take 1 capsule (150 mg total) by mouth every morning. (Patient taking differently: Take 150 mg by mouth daily. )   . budesonide (PULMICORT) 180 MCG/ACT inhaler  Inhale 2 puffs into the lungs daily as needed (Shortness of breath).  (Patient not taking: Reported on 06/12/2020)   . methocarbamol (ROBAXIN) 500 MG tablet Take 1 tablet (500 mg total) by mouth every 8 (eight) hours as needed for muscle spasms. (Patient not taking: Reported on 06/12/2020)   .  montelukast (SINGULAIR) 10 MG tablet TAKE 1 TABLET(10 MG) BY MOUTH AT BEDTIME (Patient not taking: Reported on 06/12/2020)   . oxycodone (OXY-IR) 5 MG capsule Take 5 mg by mouth at bedtime as needed for pain. (Patient not taking: Reported on 06/12/2020)   . pantoprazole (PROTONIX) 40 MG tablet Take 40 mg by mouth daily. (Patient not taking: Reported on 06/12/2020)   . potassium chloride (MICRO-K) 10 MEQ CR capsule Take 1 capsule (10 mEq total) by mouth daily. (Patient not taking: Reported on 06/12/2020)   . prochlorperazine (COMPAZINE) 10 MG tablet Take 1 tablet (10 mg total) by mouth every 6 (six) hours as needed. (Patient not taking: Reported on 06/12/2020)   . sodium chloride (MURO 128) 5 % ophthalmic ointment Place 1 application into the right eye at bedtime. (Patient not taking: Reported on 06/12/2020)   . sucralfate (CARAFATE) 1 g tablet Take 1 tablet (1 g total) by mouth 4 (four) times daily. Dissolve each tablet in 15 cc water before use. (Patient not taking: Reported on 06/12/2020)   . triamcinolone cream (KENALOG) 0.1 % Apply 1 application topically 2 (two) times daily. (Patient not taking: Reported on 06/12/2020)   . valACYclovir (VALTREX) 1000 MG tablet Take 1,000 mg by mouth daily.  (Patient not taking: Reported on 06/12/2020)    No facility-administered encounter medications on file as of 06/12/2020.     Current Diagnosis/Assessment:  SDOH Interventions     Most Recent Value  SDOH Interventions  Financial Strain Interventions Intervention Not Indicated      Goals Addressed            This Visit's Progress   . Pharmacy Care Plan       CARE PLAN ENTRY (see longitudinal plan of care for additional care plan information)  Current Barriers:  . Chronic Disease Management support, education, and care coordination needs related to Hypertension, Pulmonary embolus   Hypertension BP Readings from Last 3 Encounters:  05/12/20 132/84  04/12/20 130/85  04/07/20 (!) 144/85 .  Pharmacist  Clinical Goal(s): o Over the next 180 days, patient will work with PharmD and providers to maintain BP goal <140/90 . Current regimen:  o HCTZ 25 mg daily  . Interventions: o Discussed BP goals and benefits of medication for prevention of heart attack / stroke . Patient self care activities - Over the next 180 days, patient will: o Check BP 1-2 times weekly, document, and provide at future appointments o Ensure daily salt intake < 2300 mg/day  Pulmonary embolus . Pharmacist Clinical Goal(s) o Over the next 180 days, patient will work with PharmD and providers to optimize therapy . Current regimen:  o Lovenox 80 mg injection daily . Interventions: o Recommend to discuss oral options with oncologist (Xarelto, Eliquis, Savaysa have all been studied in cancer patients) . Patient self care activities - Over the next 180 days, patient will: o Schedule appt with oncologist to discuss oral options for PE treatment  Medication management . Pharmacist Clinical Goal(s): o Over the next 180 days, patient will work with PharmD and providers to maintain optimal medication adherence . Current pharmacy: [facility] . Interventions o Comprehensive medication review performed. o Continue current medication management  strategy . Patient self care activities - Over the next 180 days, patient will: o Focus on medication adherence by fill date o Take medications as prescribed o Report any questions or concerns to PharmD and/or provider(s)  Initial goal documentation      Asthma     Tobacco Status:  Social History   Tobacco Use  Smoking Status Never Smoker  Smokeless Tobacco Never Used   Patient has tried/failed these meds in past: n/a Patient is currently controlled on the following medications:  . ProAir Respiclick 161 mcg/act 2 puff q4hr PRN  . Budesonide 180 mcg/act 2 puff daily PRN - not taking . Montelukast 10 mg QHS - not taking  Using maintenance inhaler regularly? No Frequency of  rescue inhaler use:  infrequently  We discussed: Pt reports her asthma is well controlled without medication. She reports her last asthma was over 10 years ago when he lived in [place]. She does have rescue inhaler available if needed.  Plan  Monitor s/sx of worsening asthma  Hypertension   BP goal is:  <140/90  Office blood pressures are  BP Readings from Last 3 Encounters:  05/12/20 132/84  04/12/20 130/85  04/07/20 (!) 144/85   Kidney Function Lab Results  Component Value Date/Time   CREATININE 0.71 02/21/2020 09:42 AM   CREATININE 0.72 02/14/2020 10:13 AM   CREATININE 0.8 11/06/2015 10:39 AM   CREATININE 0.8 11/28/2014 08:46 AM   GFR 76.75 11/29/2019 03:14 PM   GFRNONAA >60 02/21/2020 09:42 AM   GFRAA >60 02/21/2020 09:42 AM   K 3.5 02/21/2020 09:42 AM   K 3.5 02/14/2020 10:13 AM   K 3.4 (L) 11/06/2015 10:39 AM   K 3.5 11/28/2014 08:46 AM   Patient checks BP at home daily Patient home BP readings are ranging: 120s/80s  Patient has failed these meds in the past: n/a Patient is currently controlled on the following medications:  . HCTZ 25 mg daily   We discussed: BP goals; pt had issues with low BP when she was on chemo so HCTZ was held; now BP is "normal" and she is back on HCTZ. She was also started on potassium 10 meq after K 3.2 at [facility]. However pt would like to stop KCl and substitute potassium-rich foods into her diet. She has f/u in August at [facility] where she will get labs again. Discussed dangers of hypokalemia; pt voiced understanding and will f/u with [facility] as scheduled.   Plan  Continue current medications   PE   Incidental finding on CT, 04/03/20  Patient has failed these meds in past: n/a Patient is currently controlled on the following medications:  . Lovenox 80 mg q24h ($90/month)  We discussed: Pt will require treatment for 6 months (through 10/04/20); she is interested in switching to an oral medication. Xarelto, Eliqus and Savaysa all have data  supporting use in active cancer - only Eliquis and Xarelto are covered by patient's insurance (Tier 3 $45/month). Pt will set up appt to discuss with oncologist.  Plan  Continue current medications  Depression / Anxiety   Depression screen [facility] 2/9 09/24/2019 10/08/2017 07/08/2017  Decreased Interest 0 0 0  Down, Depressed, Hopeless 0 0 0  PHQ - 2 Score 0 0 0  Altered sleeping 0 0 -  Tired, decreased energy 0 0 -  Change in appetite 0 0 -  Feeling bad or failure about yourself  0 0 -  Trouble concentrating 0 0 -  Moving slowly or fidgety/restless 0 0 -  Suicidal thoughts 0 0 -  PHQ-9 Score 0 0 -  Difficult doing work/chores - Not difficult at all -  Some recent data might be hidden   GAD7 Score: GAD 7 : Generalized Anxiety Score 09/24/2019  Nervous, Anxious, on Edge 0  Control/stop worrying 0  Worry too much - different things 0  Trouble relaxing 1  Restless 0  Easily annoyed or irritable 0  Afraid - awful might happen 0  Total GAD 7 Score 1   Patient has failed these meds in past: n/a Patient is currently controlled on the following medications:  . Clonazepam 0.5 mg QHS PRN  . Venlafaxine XR 150 mg daily   Trazodone 100-200 mg QHS PRN sleep  We discussed:  Pt reports sx are well controlled; she has not used clonazepam in months.    Plan  Continue current medications  Chronic Pain   Patient has failed these meds in past: oxycodone, methocarbamol Patient is currently controlled on the following medications:  . Gabapentin 100 mg QHS   We discussed:  Pt reports pain is well controlled, she has not required additional pain medications in months. She is not sure if gabapentin is helping - she reports it was initially prescribed to help with cough. Pt may try time off gabapentin to see if she notices a difference  Plan  Continue current medications  Trial off gabapentin   GERD / Esophageal SCC   Patient has failed these meds in past: pantoprazole,  sucralfate Patient is currently controlled on the following medications:   No medications  We discussed:  Pt reports she has not taken pantoprazole or sucralfate since they did not help - they were prescribed before dx of esophageal SCC.  Plan  Continue to monitor for symptoms  Macular Edema Right Eye    Patient has failed these meds in past: valacyclovir Patient is currently controlled on the following medications:  . Prolensa 0.07% 1 drop right eye QID  . Cosopt 22.3-6.8 mg/mL 1 drop right eye BID  . Lotemax 0.5% ophth 1 drop right eye QID  . Refresh eye drop PRN   We discussed:  Pt denies issues with eye drops; she is no longer taking valacyclovir  Plan  Continue current medications  Misc / OTC    Patient has failed these meds in past: n/a Patient is currently controlled on the following medications:  . Tussionex 10-8 mg  -takes HS, not every night. . Flonase 50 mcg/act 2 spray daily PRN  . Triamcinolone 0.1% cream  We discussed:  Patient is satisfied with current regimen and denies issues   Plan  Continue current medications   Medication Management   Pt uses [facility] for all medications Uses pill box? No - prefers bottles Pt endorses 100% compliance  We discussed: [facility] is a preferred pharmacy with insurance; pt is satisfied with services.  Plan  Continue current medication management strategy    Follow up: 6 month phone visit  Charlene Brooke, PharmD Clinical Pharmacist [facility] [phone]

## 2020-06-13 NOTE — Addendum Note (Signed)
Addended by: Aviva Signs M on: 06/13/2020 11:29 AM   Modules accepted: Orders

## 2020-06-30 ENCOUNTER — Other Ambulatory Visit: Payer: Self-pay | Admitting: Nurse Practitioner

## 2020-06-30 DIAGNOSIS — I2699 Other pulmonary embolism without acute cor pulmonale: Secondary | ICD-10-CM

## 2020-06-30 DIAGNOSIS — C158 Malignant neoplasm of overlapping sites of esophagus: Secondary | ICD-10-CM

## 2020-06-30 MED ORDER — ENOXAPARIN SODIUM 80 MG/0.8ML ~~LOC~~ SOLN: 80.0000 mg | SUBCUTANEOUS | 2 refills | Status: DC

## 2020-07-12 NOTE — Progress Notes (Signed)
I received an email from patient's husband that she had recently developed an irritating cough.  Denies fever or chills, cough is sometimes productive with yellowish color. I consulted with Ned Card NP and notified him that her recommendation is that patient be tested for Covid as she received her vaccines near time of treatment.  Also if she develops fever, chills, increased difficulty swallowing they will need to call.  He verbalized an understanding.

## 2020-07-14 DIAGNOSIS — Z20828 Contact with and (suspected) exposure to other viral communicable diseases: Secondary | ICD-10-CM | POA: Diagnosis not present

## 2020-07-24 DIAGNOSIS — Z79899 Other long term (current) drug therapy: Secondary | ICD-10-CM | POA: Diagnosis not present

## 2020-07-24 DIAGNOSIS — R918 Other nonspecific abnormal finding of lung field: Secondary | ICD-10-CM | POA: Diagnosis not present

## 2020-07-24 DIAGNOSIS — C154 Malignant neoplasm of middle third of esophagus: Secondary | ICD-10-CM | POA: Diagnosis not present

## 2020-07-24 DIAGNOSIS — Z9221 Personal history of antineoplastic chemotherapy: Secondary | ICD-10-CM | POA: Diagnosis not present

## 2020-07-24 DIAGNOSIS — Z08 Encounter for follow-up examination after completed treatment for malignant neoplasm: Secondary | ICD-10-CM | POA: Diagnosis not present

## 2020-07-24 DIAGNOSIS — Z8501 Personal history of malignant neoplasm of esophagus: Secondary | ICD-10-CM | POA: Diagnosis not present

## 2020-07-24 DIAGNOSIS — Z86711 Personal history of pulmonary embolism: Secondary | ICD-10-CM | POA: Diagnosis not present

## 2020-07-24 DIAGNOSIS — Z923 Personal history of irradiation: Secondary | ICD-10-CM | POA: Diagnosis not present

## 2020-07-24 DIAGNOSIS — Z7901 Long term (current) use of anticoagulants: Secondary | ICD-10-CM | POA: Diagnosis not present

## 2020-07-25 DIAGNOSIS — I10 Essential (primary) hypertension: Secondary | ICD-10-CM | POA: Diagnosis not present

## 2020-07-25 DIAGNOSIS — C154 Malignant neoplasm of middle third of esophagus: Secondary | ICD-10-CM | POA: Diagnosis not present

## 2020-07-25 DIAGNOSIS — J4520 Mild intermittent asthma, uncomplicated: Secondary | ICD-10-CM | POA: Diagnosis not present

## 2020-07-25 DIAGNOSIS — Z7901 Long term (current) use of anticoagulants: Secondary | ICD-10-CM | POA: Diagnosis not present

## 2020-07-25 DIAGNOSIS — Z9221 Personal history of antineoplastic chemotherapy: Secondary | ICD-10-CM | POA: Diagnosis not present

## 2020-07-25 DIAGNOSIS — Z79899 Other long term (current) drug therapy: Secondary | ICD-10-CM | POA: Diagnosis not present

## 2020-07-25 DIAGNOSIS — Z853 Personal history of malignant neoplasm of breast: Secondary | ICD-10-CM | POA: Diagnosis not present

## 2020-07-25 DIAGNOSIS — E46 Unspecified protein-calorie malnutrition: Secondary | ICD-10-CM | POA: Diagnosis not present

## 2020-07-25 DIAGNOSIS — M19042 Primary osteoarthritis, left hand: Secondary | ICD-10-CM | POA: Diagnosis not present

## 2020-07-25 DIAGNOSIS — K2210 Ulcer of esophagus without bleeding: Secondary | ICD-10-CM | POA: Diagnosis not present

## 2020-07-25 DIAGNOSIS — Z8501 Personal history of malignant neoplasm of esophagus: Secondary | ICD-10-CM | POA: Diagnosis not present

## 2020-07-25 DIAGNOSIS — Z86711 Personal history of pulmonary embolism: Secondary | ICD-10-CM | POA: Diagnosis not present

## 2020-07-25 DIAGNOSIS — R1310 Dysphagia, unspecified: Secondary | ICD-10-CM | POA: Diagnosis not present

## 2020-07-25 DIAGNOSIS — M19041 Primary osteoarthritis, right hand: Secondary | ICD-10-CM | POA: Diagnosis not present

## 2020-07-25 DIAGNOSIS — Z681 Body mass index (BMI) 19 or less, adult: Secondary | ICD-10-CM | POA: Diagnosis not present

## 2020-07-25 DIAGNOSIS — E876 Hypokalemia: Secondary | ICD-10-CM | POA: Diagnosis not present

## 2020-07-25 DIAGNOSIS — Z923 Personal history of irradiation: Secondary | ICD-10-CM | POA: Diagnosis not present

## 2020-07-31 ENCOUNTER — Encounter: Payer: Self-pay | Admitting: Oncology

## 2020-08-03 ENCOUNTER — Other Ambulatory Visit: Payer: Self-pay

## 2020-08-03 ENCOUNTER — Inpatient Hospital Stay: Payer: PPO

## 2020-08-03 ENCOUNTER — Inpatient Hospital Stay: Payer: PPO | Attending: Oncology | Admitting: Oncology

## 2020-08-03 VITALS — BP 122/80 | HR 110 | Temp 98.7°F | Resp 16 | Ht 63.0 in | Wt 99.6 lb

## 2020-08-03 DIAGNOSIS — R1310 Dysphagia, unspecified: Secondary | ICD-10-CM | POA: Insufficient documentation

## 2020-08-03 DIAGNOSIS — C158 Malignant neoplasm of overlapping sites of esophagus: Secondary | ICD-10-CM

## 2020-08-03 DIAGNOSIS — I2699 Other pulmonary embolism without acute cor pulmonale: Secondary | ICD-10-CM | POA: Diagnosis not present

## 2020-08-03 DIAGNOSIS — Z23 Encounter for immunization: Secondary | ICD-10-CM

## 2020-08-03 DIAGNOSIS — H209 Unspecified iridocyclitis: Secondary | ICD-10-CM | POA: Insufficient documentation

## 2020-08-03 DIAGNOSIS — F329 Major depressive disorder, single episode, unspecified: Secondary | ICD-10-CM | POA: Diagnosis not present

## 2020-08-03 DIAGNOSIS — Z853 Personal history of malignant neoplasm of breast: Secondary | ICD-10-CM | POA: Insufficient documentation

## 2020-08-03 DIAGNOSIS — C50511 Malignant neoplasm of lower-outer quadrant of right female breast: Secondary | ICD-10-CM | POA: Diagnosis not present

## 2020-08-03 DIAGNOSIS — H1820 Unspecified corneal edema: Secondary | ICD-10-CM | POA: Insufficient documentation

## 2020-08-03 DIAGNOSIS — Z9221 Personal history of antineoplastic chemotherapy: Secondary | ICD-10-CM | POA: Insufficient documentation

## 2020-08-03 DIAGNOSIS — I10 Essential (primary) hypertension: Secondary | ICD-10-CM | POA: Insufficient documentation

## 2020-08-03 DIAGNOSIS — J45909 Unspecified asthma, uncomplicated: Secondary | ICD-10-CM | POA: Diagnosis not present

## 2020-08-03 DIAGNOSIS — Z170 Estrogen receptor positive status [ER+]: Secondary | ICD-10-CM | POA: Diagnosis not present

## 2020-08-03 DIAGNOSIS — C154 Malignant neoplasm of middle third of esophagus: Secondary | ICD-10-CM | POA: Diagnosis not present

## 2020-08-03 DIAGNOSIS — C778 Secondary and unspecified malignant neoplasm of lymph nodes of multiple regions: Secondary | ICD-10-CM | POA: Diagnosis not present

## 2020-08-03 DIAGNOSIS — Z923 Personal history of irradiation: Secondary | ICD-10-CM | POA: Insufficient documentation

## 2020-08-03 MED ORDER — APIXABAN 5 MG PO TABS
5.0000 mg | ORAL_TABLET | Freq: Two times a day (BID) | ORAL | 2 refills | Status: DC
Start: 2020-08-03 — End: 2020-12-06

## 2020-08-03 NOTE — Progress Notes (Signed)
[facility] OFFICE PROGRESS NOTE   Diagnosis: Esophagus cancer  INTERVAL HISTORY:   Jo Morrison returns as scheduled.  She feels well.  She has mild solid dysphagia.  No other complaint.  She continues Lovenox anticoagulation.  A restaging evaluation at [facility] included CTs on 07/24/2020 which revealed mild wall thickening of the mid to distal esophagus.  A previously noted left gastric node has decreased in size, now measuring 0.5 cm.  No evidence of metastatic disease in the abdomen or pelvis. An upper endoscopy 07/25/2020 revealed changes of ulcerative esophagitis on multiple esophagus biopsies.  No evidence of dysplasia or carcinoma.  CMV, HSV, and a fungal stain were negative.  Objective:  Vital signs in last 24 hours:  Blood pressure 122/80, pulse (!) 110, temperature 98.7 F (37.1 C), temperature source Tympanic, resp. rate 16, height _0  (1.6 m), weight 99 lb 9.6 oz (45.2 kg), SpO2 99 %.    HEENT: Neck without mass Lymphatics: No cervical, supraclavicular, or axillary nodes Resp: Lungs clear bilaterally Cardio: Regular rate and rhythm GI: No hepatomegaly, nontender, no mass Vascular: No leg edema    Portacath/PICC-without erythema  Lab Results:  Lab Results  Component Value Date   WBC 3.1 (L) 03/13/2020   HGB 13.2 03/13/2020   HCT 38.9 03/13/2020   MCV 95.1 03/13/2020   PLT 193 03/13/2020   NEUTROABS 1.9 03/13/2020    CMP  Lab Results  Component Value Date   NA 141 02/21/2020   K 3.5 02/21/2020   CL 106 02/21/2020   CO2 28 02/21/2020   GLUCOSE 146 (H) 02/21/2020   BUN 10 02/21/2020   CREATININE 0.71 02/21/2020   CALCIUM 8.9 02/21/2020   PROT 6.4 (L) 02/21/2020   ALBUMIN 3.4 (L) 02/21/2020   AST 16 02/21/2020   ALT 12 02/21/2020   ALKPHOS 44 02/21/2020   BILITOT 0.8 02/21/2020   GFRNONAA >60 02/21/2020   GFRAA >60 02/21/2020     Medications: I have reviewed the patient's current medications.   Assessment/Plan: 1. Squamous cell  carcinoma of the upper/middle third of the esophagus ? Upper endoscopy 01/14/2020-ulcerated mass in the proximal/mid esophagus at 26-31 cm from the incisors, biopsy confirmed invasive moderately differentiated squamous cell carcinoma, PD-L1 combined positive score 0% ? CT abdomen/pelvis 12/09/2019-uterine fibroids ? CT chest 12/30/2019-normal esophagus, new sclerosis of the medial head of the right clavicle likely degenerative ? PET scan 01/24/2020-long segment of hypermetabolic thickening of the distal esophagus. For hypermetabolic metastatic lymph nodes, 3 in the mediastinum and 1 in the upper abdomen gastrohepatic ligament. ? EUS 02/10/2020-ulcerated mass in the mid esophagus,uT3, 3 suspicious lymph nodes near the primary tumor, malignant appearing lymph node at the gastrohepatic ligament (level 18)-metastatic squamous cell carcinoma ? Radiation 01/31/2020 ? Cycle 1 weekly Taxol/carboplatin 02/01/2020 ? Cycle 2 weekly Taxol/carboplatin 02/08/2020 ? Cycle 3 weekly Taxol/carboplatin 02/15/2020 ? Cycle 4 weekly Taxol/carboplatin 02/22/2020 ? Cycle 5 weekly Taxol/carboplatin 02/29/2020 ? Radiation completed 03/08/2020 ? CTs 04/03/2020-esophageal mass appears grossly smaller, regression of previously noted lymphadenopathy in the mediastinum and upper abdomen, nonocclusive pulmonary embolus right-sided pulmonary arteries ? Upper endoscopy 05/02/2020-treatment effect visible starting approximately 26 cm from the incisors.  Scope passed easily into the stomach and proximal duodenum.  Stomach and proximal duodenum grossly normal.  Scope withdrawn to the mid esophagus with four-quadrant biopsies obtained.  2 biopsy showed acute ulcerative esophagitis; 2 biopsies showed squamous mucosa with minimal histologic abnormality. ? CTs 07/24/2020-no evidence of metastatic disease, left gastric lymph node has decreased in size,  mild wall thickening of the mid to distal esophagus ? Upper endoscopy 07/25/2020-multiple biopsies with  changes of ulcerative esophagitis, negative fungal, HSV, and CMV stains, no evidence of malignancy 2. 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  Pulmonary embolus on CT 04/03/2020-lovenox initiated, anticoagulation changed to apixaban beginning 08/03/2020  9.Odynophagia secondary toradiation-resolved     Disposition: Jo Morrison is in clinical remission from esophagus cancer.  She underwent a negative restaging evaluation at [facility] approximately 2 weeks ago.  I encouraged her to increase her calorie intake as tolerated.  She has been maintained on apixaban anticoagulation since being diagnosed with a pulmonary embolism in May.  I recommend she continue at least 6 months of anticoagulation therapy.  She would like to change to an oral anticoagulant.  I recommend apixaban.  She understands the risk of bleeding.  She will return for an office visit in 2 months.  Betsy Coder, MD  08/03/2020  12:19 PM

## 2020-08-03 NOTE — Progress Notes (Signed)
   Covid-19 Vaccination Clinic  Name:  Jo Morrison    MRN: [id number] DOB: 02-08-1946  08/03/2020  [name] was observed post Covid-19 immunization for 15 minutes without incident. She was provided with Vaccine Information Sheet and instruction to access the V-Safe system.   Ms. Cope was instructed to call 911 with any severe reactions post vaccine: Marland Kitchen Difficulty breathing  . Swelling of face and throat  . A fast heartbeat  . A bad rash all over body  . Dizziness and weakness

## 2020-08-17 ENCOUNTER — Telehealth: Payer: Self-pay | Admitting: Oncology

## 2020-08-17 NOTE — Telephone Encounter (Signed)
Rescheduled appointment per provider pal schedule. Called patient, no answer. Left message for patient with appointment date and time.

## 2020-09-01 ENCOUNTER — Encounter: Payer: Self-pay | Admitting: Internal Medicine

## 2020-10-03 ENCOUNTER — Inpatient Hospital Stay: Payer: PPO | Attending: Oncology | Admitting: Oncology

## 2020-10-03 ENCOUNTER — Other Ambulatory Visit: Payer: Self-pay

## 2020-10-03 VITALS — BP 147/91 | HR 94 | Temp 97.6°F | Resp 17 | Ht 63.0 in | Wt 104.7 lb

## 2020-10-03 DIAGNOSIS — Z853 Personal history of malignant neoplasm of breast: Secondary | ICD-10-CM | POA: Insufficient documentation

## 2020-10-03 DIAGNOSIS — R600 Localized edema: Secondary | ICD-10-CM | POA: Insufficient documentation

## 2020-10-03 DIAGNOSIS — Z86711 Personal history of pulmonary embolism: Secondary | ICD-10-CM | POA: Insufficient documentation

## 2020-10-03 DIAGNOSIS — H209 Unspecified iridocyclitis: Secondary | ICD-10-CM | POA: Diagnosis not present

## 2020-10-03 DIAGNOSIS — I10 Essential (primary) hypertension: Secondary | ICD-10-CM | POA: Diagnosis not present

## 2020-10-03 DIAGNOSIS — J45909 Unspecified asthma, uncomplicated: Secondary | ICD-10-CM | POA: Diagnosis not present

## 2020-10-03 DIAGNOSIS — F329 Major depressive disorder, single episode, unspecified: Secondary | ICD-10-CM | POA: Insufficient documentation

## 2020-10-03 DIAGNOSIS — C154 Malignant neoplasm of middle third of esophagus: Secondary | ICD-10-CM | POA: Diagnosis not present

## 2020-10-03 DIAGNOSIS — Z923 Personal history of irradiation: Secondary | ICD-10-CM | POA: Diagnosis not present

## 2020-10-03 DIAGNOSIS — C158 Malignant neoplasm of overlapping sites of esophagus: Secondary | ICD-10-CM

## 2020-10-03 DIAGNOSIS — Z86000 Personal history of in-situ neoplasm of breast: Secondary | ICD-10-CM | POA: Insufficient documentation

## 2020-10-03 DIAGNOSIS — K2210 Ulcer of esophagus without bleeding: Secondary | ICD-10-CM | POA: Insufficient documentation

## 2020-10-03 NOTE — Progress Notes (Signed)
[place] OFFICE PROGRESS NOTE   Diagnosis: Esophagus cancer  INTERVAL HISTORY:   Jo Morrison returns as scheduled.  She feels well.  She has increased her food intake.  She has gained weight.  No dysphagia.  No bleeding.  No dyspnea.  No complaint.  Objective:  Vital signs in last 24 hours:  Blood pressure (!) 147/91, pulse 94, temperature 97.6 F (36.4 C), temperature source Tympanic, resp. rate 17, height _0  (1.6 m), weight 104 lb 11.2 oz (47.5 kg), SpO2 100 %.    Lymphatics: No cervical, supraclavicular, axillary, or inguinal nodes Resp: Lungs clear bilaterally Cardio: Regular rate and rhythm GI: No hepatosplenomegaly, no mass, nontender Vascular: No leg edema, left lower leg is slightly larger than the right side   Lab Results:  Lab Results  Component Value Date   WBC 3.1 (L) 03/13/2020   HGB 13.2 03/13/2020   HCT 38.9 03/13/2020   MCV 95.1 03/13/2020   PLT 193 03/13/2020   NEUTROABS 1.9 03/13/2020    CMP  Lab Results  Component Value Date   NA 141 02/21/2020   K 3.5 02/21/2020   CL 106 02/21/2020   CO2 28 02/21/2020   GLUCOSE 146 (H) 02/21/2020   BUN 10 02/21/2020   CREATININE 0.71 02/21/2020   CALCIUM 8.9 02/21/2020   PROT 6.4 (L) 02/21/2020   ALBUMIN 3.4 (L) 02/21/2020   AST 16 02/21/2020   ALT 12 02/21/2020   ALKPHOS 44 02/21/2020   BILITOT 0.8 02/21/2020   GFRNONAA >60 02/21/2020   GFRAA >60 02/21/2020     Medications: I have reviewed the patient's current medications.   Assessment/Plan: 1. 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 Upper endoscopy 01/14/2020-ulcerated mass in the proximal/mid esophagus at 26-31 cm from the incisors, biopsy confirmed invasive moderately differentiated squamous cell carcinoma, PD-L1 combined positive score 0% ? CT abdomen/pelvis 12/09/2019-uterine fibroids ? CT chest 12/30/2019-normal esophagus, new sclerosis of the medial head of the right clavicle likely  degenerative ? PET scan 01/24/2020-long segment of hypermetabolic thickening of the distal esophagus. For hypermetabolic metastatic lymph nodes, 3 in the mediastinum and 1 in the upper abdomen gastrohepatic ligament. ? EUS 02/10/2020-ulcerated mass in the mid esophagus,uT3, 3 suspicious lymph nodes near the primary tumor, malignant appearing lymph node at the gastrohepatic ligament (level 18)-metastatic squamous cell carcinoma ? Radiation 01/31/2020 ? Cycle 1 weekly Taxol/carboplatin 02/01/2020 ? Cycle 2 weekly Taxol/carboplatin 02/08/2020 ? Cycle 3 weekly Taxol/carboplatin 02/15/2020 ? Cycle 4 weekly Taxol/carboplatin 02/22/2020 ? Cycle 5 weekly Taxol/carboplatin 02/29/2020 ? Radiation completed 03/08/2020 ? CTs 04/03/2020-esophageal mass appears grossly smaller, regression of previously noted lymphadenopathy in the mediastinum and upper abdomen, nonocclusive pulmonary embolus right-sided pulmonary arteries ? Upper endoscopy 05/02/2020-treatment effect visible starting approximately 26 cm from the incisors.  Scope passed easily into the stomach and proximal duodenum.  Stomach and proximal duodenum grossly normal.  Scope withdrawn to the mid esophagus with four-quadrant biopsies obtained.  2 biopsy showed acute ulcerative esophagitis; 2 biopsies showed squamous mucosa with minimal histologic abnormality. ? CTs 07/24/2020-no evidence of metastatic disease, left gastric lymph node has decreased in size, mild wall thickening of the mid to distal esophagus ? Upper endoscopy 07/25/2020-multiple biopsies with changes of ulcerative esophagitis, negative fungal, HSV, and CMV stains, no evidence of malignancy 2. Right breast cancer July 1996, [name], [name], right lumpectomy and axillary dissection followed by adjuvant Adriamycin/Taxol chemotherapy-4 cycles, 5 years of tamoxifen, and breast radiation  3.Left breast DCIS January 2011 -1.1  cm, [name], [name], left breast lumpectomy followed by adjuvant  radiation and 5 years of tamoxifen  4.Depression  5.Hypertension  6.Right eye uveitis, macular edema, and corneal edema  7.Asthma  8.  Pulmonary embolus on CT 04/03/2020-lovenox initiated, anticoagulation changed to apixaban beginning 08/03/2020, anticoagulation completed approximately the beginning of December 2021  9.Odynophagia secondary toradiation-resolved     Disposition: [name] appears well.  She is in remission from esophagus cancer.  She is scheduled for restaging evaluation at [facility] in January.  She has been maintained on anticoagulation therapy since May of this year when she was found to have an incidental pulmonary embolism.  She has no other previous history of venous thromboembolic disease other than a Port-A-Cath related DVT when she is being treated for breast cancer in the 1990s.  The pulmonary embolism was likely related to the cancer diagnosis and chemotherapy earlier this year.  She has completed 6 months of anticoagulation therapy.  We discussed the risk/benefit of continue anticoagulation therapy.  She is comfortable discontinuing anticoagulation therapy at this point.  She will contact us for symptoms of venous thrombosis.  She will complete the current month of apixaban and anticoagulation will then be discontinued.  She will return for an office visit in 4 months.  Betsy Coder, MD  10/03/2020  10:57 AM

## 2020-10-04 ENCOUNTER — Telehealth: Payer: Self-pay | Admitting: Oncology

## 2020-10-04 NOTE — Telephone Encounter (Signed)
Scheduled appointment per 11/9 los. Spoke to patient who is aware of appointment date and time.

## 2020-10-05 ENCOUNTER — Ambulatory Visit: Payer: PPO | Admitting: Oncology

## 2020-10-29 ENCOUNTER — Other Ambulatory Visit: Payer: Self-pay | Admitting: Internal Medicine

## 2020-10-30 DIAGNOSIS — H2000 Unspecified acute and subacute iridocyclitis: Secondary | ICD-10-CM | POA: Diagnosis not present

## 2020-10-30 DIAGNOSIS — H401431 Capsular glaucoma with pseudoexfoliation of lens, bilateral, mild stage: Secondary | ICD-10-CM | POA: Diagnosis not present

## 2020-10-30 DIAGNOSIS — H18231 Secondary corneal edema, right eye: Secondary | ICD-10-CM | POA: Diagnosis not present

## 2020-11-21 NOTE — Progress Notes (Signed)
    Subjective:    CC: hip pain  I, Molly Weber, LAT, ATC, am serving as scribe for Dr. Clementeen Graham.  HPI: Pt is a 74 y/o female presenting w/ hip pain after suffering a fall when going up the stairs on 11/19/20. falling down ~4 steps. Pt landed on R hip and twisted her R knee.  She locates her pain to lateral aspect of R hip. No numbness/tingling. Pt notes that the pain has improved some since yesterday, but it's still pretty painful.  Radiating pain: no Aggravating factors: moving around, laying on side Treatments tried: Tylenol  Pertinent review of Systems: No fevers or chills.  Relevant historical information: Squamous cell esophageal carcinoma status post radiation and chemotherapy.   Objective:    Vitals:   11/22/20 0834  BP: 110/76  Pulse: 83  SpO2: 100%   General: Well Developed, thin appearing, and in no acute distress.  Decreased muscle bulk hands and face. MSK: Right hip then otherwise normal. Normal motion.  Tender palpation greater trochanter.  Reduced strength hip abduction and external rotation. Antalgic gait.  Lab and Radiology Results X-ray images right hip obtained today personally and independently interpreted Slight lucency near femoral neck.  This is doubtful for fracture.  Patient does have degenerative changes. Await formal radiology review   Impression and Recommendations:    Assessment and Plan: 74 y.o. female with right hip pain after fall.  Pain due to contusion and irritation of hip abductors and trochanteric bursitis.  She notes that she is already improving on her own.  Plan for home exercise program and watchful waiting.  Also recommend Voltaren gel.  If not rapidly improving patient will notify me and I will refer to physical therapy.  Also consider steroid injection at some point in the future.  Of note patient also has some evidence of protein calorie malnutrition.  This fits with her cancer history.  Discussed increased protein  intake.  This will allow her to heal this injury a little faster.  Recommend increasing protein intake by 30 to 60 g/day.    PDMP not reviewed this encounter. Orders Placed This Encounter  Procedures  . DG HIP UNILAT W OR W/O PELVIS 2-3 VIEWS RIGHT    Standing Status:   Future    Number of Occurrences:   1    Standing Expiration Date:   11/22/2021    Order Specific Question:   Reason for Exam (SYMPTOM  OR DIAGNOSIS REQUIRED)    Answer:   Right hip pain    Order Specific Question:   Preferred imaging location?    Answer:   [facility]   No orders of the defined types were placed in this encounter.   Discussed warning signs or symptoms. Please see discharge instructions. Patient expresses understanding.   The above documentation has been reviewed and is accurate and complete Clementeen Graham, M.D.

## 2020-11-22 ENCOUNTER — Other Ambulatory Visit: Payer: Self-pay

## 2020-11-22 ENCOUNTER — Ambulatory Visit: Payer: PPO | Admitting: Family Medicine

## 2020-11-22 ENCOUNTER — Ambulatory Visit (INDEPENDENT_AMBULATORY_CARE_PROVIDER_SITE_OTHER): Payer: PPO

## 2020-11-22 VITALS — BP 110/76 | HR 83 | Ht 63.0 in | Wt 108.0 lb

## 2020-11-22 DIAGNOSIS — E46 Unspecified protein-calorie malnutrition: Secondary | ICD-10-CM | POA: Insufficient documentation

## 2020-11-22 DIAGNOSIS — M25551 Pain in right hip: Secondary | ICD-10-CM | POA: Diagnosis not present

## 2020-11-22 DIAGNOSIS — E440 Moderate protein-calorie malnutrition: Secondary | ICD-10-CM

## 2020-11-22 DIAGNOSIS — M1611 Unilateral primary osteoarthritis, right hip: Secondary | ICD-10-CM | POA: Diagnosis not present

## 2020-11-22 NOTE — Patient Instructions (Addendum)
Thank you for coming in today.  Please get an Xray today before you leave  Let me know if you do not rapidly improved.  PT will be very helpful.   I can also do a cortisone shot which will help (for a while).   Please use voltaren gel up to 4x daily for pain as needed.   Heat or Ice is ok.   Recheck with me as needed.    Hip Bursitis  Hip bursitis is swelling of a fluid-filled sac (bursa) in your hip joint. This swelling (inflammation) can be painful. This condition may come and go over time. What are the causes?  Injury to the hip.  Overuse of the muscles that surround the hip joint.  An earlier injury or surgery of the hip.  Arthritis or gout.  Diabetes.  Thyroid disease.  Infection.  In some cases, the cause may not be known. What are the signs or symptoms?  Mild or moderate pain in the hip area. Pain may get worse with movement.  Tenderness and swelling of the hip, especially on the outer side of the hip.  In rare cases, the bursa may become infected. This may cause: ? A fever. ? Warmth and redness in the area. Symptoms may come and go. How is this treated? This condition is treated by resting, icing, applying pressure (compression), and raising (elevating) the injured area. You may hear this called the RICE treatment. Treatment may also include:  Using crutches.  Draining fluid out of the bursa to help relieve swelling.  Giving a shot of (injecting) medicine that helps to reduce swelling (cortisone).  Other medicines if the bursa is infected. Follow these instructions at home: Managing pain, stiffness, and swelling   If told, put ice on the painful area. ? Put ice in a plastic bag. ? Place a towel between your skin and the bag. ? Leave the ice on for 20 minutes, 2-3 times a day. ? Raise (elevate) your hip above the level of your heart as much as you can without pain. To do this, try putting a pillow under your hips while you lie down. Stop if this  causes pain. Activity  Return to your normal activities as told by your doctor. Ask your doctor what activities are safe for you.  Rest and protect your hip as much as you can until you feel better. General instructions  Take over-the-counter and prescription medicines only as told by your doctor.  Wear wraps that put pressure on your hip (compression wraps) only as told by your doctor.  Do not use your hip to support your body weight until your doctor says that you can.  Use crutches as told by your doctor.  Gently rub and stretch your injured area as often as is comfortable.  Keep all follow-up visits as told by your doctor. This is important. How is this prevented?  Exercise regularly, as told by your doctor.  Warm up and stretch before being active.  Cool down and stretch after being active.  Avoid activities that bother your hip or cause pain.  Avoid sitting down for long periods at a time. Contact a doctor if:  You have a fever.  You get new symptoms.  You have trouble walking.  You have trouble doing everyday activities.  You have pain that gets worse.  You have pain that does not get better with medicine.  You get red skin on your hip area.  You get a feeling of warmth  in your hip area. Get help right away if:  You cannot move your hip.  You have very bad pain. Summary  Hip bursitis is swelling of a fluid-filled sac (bursa) in your hip.  Hip bursitis can be painful.  Symptoms often come and go over time.  This condition is treated with rest, ice, compression, elevation, and medicines. This information is not intended to replace advice given to you by your health care provider. Make sure you discuss any questions you have with your health care provider. Document Revised: 07/20/2018 Document Reviewed: 07/20/2018 Elsevier Patient Education  2020 [employer].

## 2020-11-22 NOTE — Progress Notes (Signed)
X-ray shows no fracture.  There is some arthritis present however the hip joint.

## 2020-11-27 DIAGNOSIS — R918 Other nonspecific abnormal finding of lung field: Secondary | ICD-10-CM | POA: Diagnosis not present

## 2020-11-27 DIAGNOSIS — Z01818 Encounter for other preprocedural examination: Secondary | ICD-10-CM | POA: Diagnosis not present

## 2020-11-27 DIAGNOSIS — Z923 Personal history of irradiation: Secondary | ICD-10-CM | POA: Diagnosis not present

## 2020-11-27 DIAGNOSIS — Z7901 Long term (current) use of anticoagulants: Secondary | ICD-10-CM | POA: Diagnosis not present

## 2020-11-27 DIAGNOSIS — J4520 Mild intermittent asthma, uncomplicated: Secondary | ICD-10-CM | POA: Diagnosis not present

## 2020-11-27 DIAGNOSIS — F419 Anxiety disorder, unspecified: Secondary | ICD-10-CM | POA: Diagnosis not present

## 2020-11-27 DIAGNOSIS — I2693 Single subsegmental pulmonary embolism without acute cor pulmonale: Secondary | ICD-10-CM | POA: Diagnosis not present

## 2020-11-27 DIAGNOSIS — K229 Disease of esophagus, unspecified: Secondary | ICD-10-CM | POA: Diagnosis not present

## 2020-11-27 DIAGNOSIS — C50511 Malignant neoplasm of lower-outer quadrant of right female breast: Secondary | ICD-10-CM | POA: Diagnosis not present

## 2020-11-27 DIAGNOSIS — C154 Malignant neoplasm of middle third of esophagus: Secondary | ICD-10-CM | POA: Diagnosis not present

## 2020-11-27 DIAGNOSIS — C158 Malignant neoplasm of overlapping sites of esophagus: Secondary | ICD-10-CM | POA: Diagnosis not present

## 2020-11-27 DIAGNOSIS — I10 Essential (primary) hypertension: Secondary | ICD-10-CM | POA: Diagnosis not present

## 2020-11-27 DIAGNOSIS — Z7951 Long term (current) use of inhaled steroids: Secondary | ICD-10-CM | POA: Diagnosis not present

## 2020-11-27 DIAGNOSIS — R636 Underweight: Secondary | ICD-10-CM | POA: Diagnosis not present

## 2020-11-27 DIAGNOSIS — Z79899 Other long term (current) drug therapy: Secondary | ICD-10-CM | POA: Diagnosis not present

## 2020-11-28 DIAGNOSIS — Z9221 Personal history of antineoplastic chemotherapy: Secondary | ICD-10-CM | POA: Diagnosis not present

## 2020-11-28 DIAGNOSIS — Z8501 Personal history of malignant neoplasm of esophagus: Secondary | ICD-10-CM | POA: Diagnosis not present

## 2020-11-28 DIAGNOSIS — Z923 Personal history of irradiation: Secondary | ICD-10-CM | POA: Diagnosis not present

## 2020-11-28 DIAGNOSIS — Z7901 Long term (current) use of anticoagulants: Secondary | ICD-10-CM | POA: Diagnosis not present

## 2020-11-28 DIAGNOSIS — Z853 Personal history of malignant neoplasm of breast: Secondary | ICD-10-CM | POA: Diagnosis not present

## 2020-11-28 DIAGNOSIS — Z86711 Personal history of pulmonary embolism: Secondary | ICD-10-CM | POA: Diagnosis not present

## 2020-11-28 DIAGNOSIS — Z7951 Long term (current) use of inhaled steroids: Secondary | ICD-10-CM | POA: Diagnosis not present

## 2020-11-28 DIAGNOSIS — H409 Unspecified glaucoma: Secondary | ICD-10-CM | POA: Diagnosis not present

## 2020-11-28 DIAGNOSIS — K222 Esophageal obstruction: Secondary | ICD-10-CM | POA: Diagnosis not present

## 2020-11-28 DIAGNOSIS — J4520 Mild intermittent asthma, uncomplicated: Secondary | ICD-10-CM | POA: Diagnosis not present

## 2020-11-28 DIAGNOSIS — K208 Other esophagitis without bleeding: Secondary | ICD-10-CM | POA: Diagnosis not present

## 2020-11-28 DIAGNOSIS — K2289 Other specified disease of esophagus: Secondary | ICD-10-CM | POA: Diagnosis not present

## 2020-11-28 DIAGNOSIS — F419 Anxiety disorder, unspecified: Secondary | ICD-10-CM | POA: Diagnosis not present

## 2020-11-28 DIAGNOSIS — Z79899 Other long term (current) drug therapy: Secondary | ICD-10-CM | POA: Diagnosis not present

## 2020-12-05 ENCOUNTER — Other Ambulatory Visit: Payer: Self-pay | Admitting: Internal Medicine

## 2020-12-05 DIAGNOSIS — Z1231 Encounter for screening mammogram for malignant neoplasm of breast: Secondary | ICD-10-CM

## 2020-12-06 ENCOUNTER — Other Ambulatory Visit: Payer: Self-pay

## 2020-12-06 ENCOUNTER — Ambulatory Visit: Payer: PPO | Admitting: Pharmacist

## 2020-12-06 DIAGNOSIS — I10 Essential (primary) hypertension: Secondary | ICD-10-CM

## 2020-12-06 DIAGNOSIS — J4520 Mild intermittent asthma, uncomplicated: Secondary | ICD-10-CM

## 2020-12-06 NOTE — Chronic Care Management (AMB) (Signed)
Chronic Care Management Pharmacy  Name: DANALEE TARR  MRN: [id number] DOB: 17-Oct-1946   Chief Complaint/ HPI  Jo Morrison,  75 y.o. , female presents for their [name] CCM visit with the clinical pharmacist via telephone due to COVID-19 Pandemic.  PCP : Hoyt Koch, MD Patient Care Team: Hoyt Koch, MD as PCP - General (Internal Medicine) Nicholas Lose, MD as Consulting Physician (Hematology and Oncology) Gatha Mayer, MD as Consulting Physician (Gastroenterology) Jonnie Finner, RN as Oncology Nurse Navigator Charlton Haws, [facility] as Pharmacist (Pharmacist) Ladell Pier, MD as Consulting Physician (Oncology)  Their chronic conditions include: Hypertension, Asthma, Anxiety, Osteoarthritis and Primary squamous cell carcinoma of esophagus, hx breast cancer (1996, 2011)  From [place], worked for [employer] and moved around a lot - [place], [place]. Enjoys gardening, cooking.   Office Visits: 09/17/19: Patient presented to Dr. Sharlet Salina for follow-up.   Consult Visit: 11/22/20 Dr Georgina Snell (sports med): eval for hip pain. Xray showed no fracture. Advised PT, voltaren gel.  10/03/20 Dr Ammie Dalton (heme/onc): completed 6 mos of anticoagulation after PE, decided to stop Eliquis after completing current month  08/03/20 Dr Benay Spice (heme/onc): restaging eval at [facility] revealed no evidence of metastatic disease - pt now in remission. Recommend 6 mos of anticoagulation from PE in 03/2020 - changed to Eliquis.  [facility]  - f/u for esophageal cancer. 05/12/20 NP Ned Card (heme/onc): esohageal SCC diagnosed 12/2019. Radiation/chemo Mar-April 2021. EGD show no residual cancer. HypoK likely d/t HCTZ, start KCL 10 mEq. 05/01/20: Patient presented to Dr. Williemae Natter (oncology) for evaluation prior to surgery. Chemoradiation completed 02/29/20, pulmonary embolus 04/03/20 - initiated Lovonex. 04/12/20: Patient presented to Dr. Ammie Dalton (oncology) for follow-up. Restaging endoscopy scheduled  for 05/02/20. Vitamin B, vitamin d, multivitamin discontinued (patient not taking)  01/26/20: Patient presented to Donnal Moat, PA-C for recurrent major depression.  01/17/20: Patient presented to Dr. Roby Lofts (Ophthalmology) for macular edema   Allergies  Allergen Reactions  . Latex Shortness Of Breath    Medications: Outpatient Encounter Medications as of 12/06/2020  Medication Sig Note  . Albuterol Sulfate (PROAIR RESPICLICK) 123XX123 (90 BASE) MCG/ACT AEPB Inhale 2 puffs into the lungs every 4 (four) hours as needed (SOB).   . Bromfenac Sodium (PROLENSA) 0.07 % SOLN Place 1 drop into the right eye 4 (four) times daily. (Patient taking differently: Place 1 drop into the right eye daily in the afternoon.) 02/21/2020: Three times/day  . clonazePAM (KLONOPIN) 0.5 MG tablet Take 1 tablet (0.5 mg total) by mouth at bedtime as needed.   . dorzolamide-timolol (COSOPT) 22.3-6.8 MG/ML ophthalmic solution Place 1 drop into the right eye 2 (two) times daily.    . fluticasone (FLONASE) 50 MCG/ACT nasal spray Place 2 sprays into the nose daily as needed for allergies.    Marland Kitchen guaifenesin (ROBITUSSIN) 100 MG/5ML syrup Take 200 mg by mouth as needed for cough.   . hydrochlorothiazide (HYDRODIURIL) 25 MG tablet TAKE 1 TABLET(25 MG) BY MOUTH DAILY   . loteprednol (LOTEMAX) 0.5 % ophthalmic suspension Place 1 drop into the right eye 4 (four) times daily.   . methocarbamol (ROBAXIN) 500 MG tablet Take 1 tablet (500 mg total) by mouth every 8 (eight) hours as needed for muscle spasms.   . montelukast (SINGULAIR) 10 MG tablet Take 10 mg by mouth at bedtime.   . pantoprazole (PROTONIX) 40 MG tablet Take 40 mg by mouth daily.   Marland Kitchen REFRESH OPTIVE ADVANCED 0.5-1-0.5 % SOLN Place 1 drop into both eyes  daily as needed (itching Eyes).    . sodium chloride (MURO 128) 2 % ophthalmic solution Place 1 drop into the right eye 4 (four) times daily.   . sodium chloride (MURO 128) 5 % ophthalmic ointment Place 1 application into the right  eye at bedtime.    . traZODone (DESYREL) 100 MG tablet TAKE 1 TO 2 TABLETS(100 TO 200 MG) BY MOUTH AT BEDTIME AS NEEDED FOR SLEEP (Patient taking differently: Take 150 mg by mouth at bedtime.) 10/03/2020: Takes 100 mg at bedtime  . venlafaxine XR (EFFEXOR-XR) 150 MG 24 hr capsule Take 1 capsule (150 mg total) by mouth every morning. (Patient taking differently: Take 150 mg by mouth daily.)   . [DISCONTINUED] apixaban (ELIQUIS) 5 MG TABS tablet Take 1 tablet (5 mg total) by mouth 2 (two) times daily.   Marland Kitchen gabapentin (NEURONTIN) 100 MG capsule Take 100 mg by mouth at bedtime.  (Patient not taking: Reported on 12/06/2020)   . [DISCONTINUED] budesonide (PULMICORT) 180 MCG/ACT inhaler Inhale 2 puffs into the lungs daily as needed (Shortness of breath).  (Patient not taking: Reported on 12/06/2020)   . [DISCONTINUED] montelukast (SINGULAIR) 10 MG tablet TAKE 1 TABLET(10 MG) BY MOUTH AT BEDTIME (Patient not taking: Reported on 12/06/2020)   . [DISCONTINUED] pantoprazole (PROTONIX) 40 MG tablet TAKE 1 TABLET(40 MG) BY MOUTH DAILY (Patient not taking: Reported on 12/06/2020)    No facility-administered encounter medications on file as of 12/06/2020.   Wt Readings from Last 3 Encounters:  11/22/20 108 lb (49 kg)  10/03/20 104 lb 11.2 oz (47.5 kg)  08/03/20 99 lb 9.6 oz (45.2 kg)   Lab Results  Component Value Date   CREATININE 0.71 02/21/2020   BUN 10 02/21/2020   GFR 76.75 11/29/2019   GFRNONAA >60 02/21/2020   GFRAA >60 02/21/2020   NA 141 02/21/2020   K 3.5 02/21/2020   CALCIUM 8.9 02/21/2020   CO2 28 02/21/2020    Current Diagnosis/Assessment:    Goals Addressed            This Visit's Progress   . Pharmacy Care Plan       CARE PLAN ENTRY (see longitudinal plan of care for additional care plan information)  Current Barriers:  . Chronic Disease Management support, education, and care coordination needs related to Hypertension, Pulmonary embolus   Hypertension BP Readings from Last 3  Encounters:  05/12/20 132/84  04/12/20 130/85  04/07/20 (!) 144/85 .  Pharmacist Clinical Goal(s): o Over the next 180 days, patient will work with PharmD and providers to maintain BP goal <140/90 . Current regimen:  o HCTZ 25 mg daily  . Interventions: o Discussed BP goals and benefits of medication for prevention of heart attack / stroke . Patient self care activities - Over the next 180 days, patient will: o Check BP 1-2 times weekly, document, and provide at future appointments o Ensure daily salt intake < 2300 mg/day  Pulmonary embolus . Pharmacist Clinical Goal(s) o Over the next 180 days, patient will work with PharmD and providers to optimize therapy . Current regimen:  o No medications . Interventions: o Patient completed 6 months of anticoagulation following PE . Patient self care activities - Over the next 180 days, patient will: o Continue regular follow up with providers  Medication management . Pharmacist Clinical Goal(s): o Over the next 180 days, patient will work with PharmD and providers to maintain optimal medication adherence . Current pharmacy: [facility] . Interventions o Comprehensive medication review performed. o  Continue current medication management strategy . Patient self care activities - Over the next 180 days, patient will: o Focus on medication adherence by fill date o Take medications as prescribed o Report any questions or concerns to PharmD and/or provider(s)  Please see past updates related to this goal by clicking on the "Past Updates" button in the selected goal       Asthma     Tobacco Status:  Social History   Tobacco Use  Smoking Status Never Smoker  Smokeless Tobacco Never Used   Patient has tried/failed these meds in past: budesonide, montelukast Patient is currently controlled on the following medications:  . ProAir Respiclick 119 mcg/act 2 puff q4hr PRN   Using maintenance inhaler regularly? No Frequency of rescue  inhaler use:  infrequently  We discussed: Pt reports her asthma is well controlled without medication. She reports her last asthma was over 10 years ago when he lived in [place]. She does have rescue inhaler available if needed.  Plan  Monitor s/sx of worsening asthma  Hypertension   BP goal is:  <140/90  Office blood pressures are  BP Readings from Last 3 Encounters:  11/22/20 110/76  10/03/20 (!) 147/91  08/03/20 122/80   Patient checks BP at home daily Patient home BP readings are ranging: 120s/80s  Patient has failed these meds in the past: n/a Patient is currently controlled on the following medications:  . HCTZ 25 mg daily   We discussed: BP goals; pt previously had issues with low K but made an effort to increase potassium-rich foods; most recent K+ at [facility] was 3.5 (11/27/20) per Care Everywhere.   Plan  Continue current medications   PE   Incidental finding on CT, 04/03/20  Patient has failed these meds in past: Lovenox, Eliquis Patient is currently controlled on the following medications:  . No medications  We discussed: Pt completed 6 months of treatment (Lovenox, then Eliquis)  Plan  Continue to monitor    Vaccines   Reviewed and discussed patient's vaccination history.    Immunization History  Administered Date(s) Administered  . Influenza Split 08/20/2020  . Influenza, High Dose Seasonal PF 08/09/2016, 09/09/2017, 09/07/2018, 08/01/2019  . Influenza,inj,Quad PF,6+ Mos 08/10/2015  . Influenza-Unspecified 08/01/2019, 02/18/2020, 08/20/2020  . PFIZER SARS-COV-2 Vaccination 12/05/2019, 12/26/2019, 08/03/2020  . Pneumococcal Conjugate-13 12/13/2014  . Pneumococcal Polysaccharide-23 06/16/2013  . Tdap 11/25/1998, 08/10/2015  . Zoster 05/27/2012  . Zoster Recombinat (Shingrix) 06/22/2017, 09/10/2017    Plan  Up to date  Medication Management   Pt uses [facility] for all medications Uses pill box? No - prefers bottles Pt endorses 100%  compliance  We discussed: [facility] is a preferred pharmacy with insurance; pt is satisfied with services.  Plan  Continue current medication management strategy    Follow up: 6 month phone visit  Charlene Brooke, PharmD Clinical Pharmacist [facility] [phone]

## 2020-12-06 NOTE — Patient Instructions (Signed)
Visit Information  Phone number for Pharmacist: [phone]  Goals Addressed            This Visit's Progress   . Pharmacy Care Plan       CARE PLAN ENTRY (see longitudinal plan of care for additional care plan information)  Current Barriers:  . Chronic Disease Management support, education, and care coordination needs related to Hypertension, Pulmonary embolus   Hypertension BP Readings from Last 3 Encounters:  05/12/20 132/84  04/12/20 130/85  04/07/20 (!) 144/85 .  Pharmacist Clinical Goal(s): o Over the next 180 days, patient will work with PharmD and providers to maintain BP goal <140/90 . Current regimen:  o HCTZ 25 mg daily  . Interventions: o Discussed BP goals and benefits of medication for prevention of heart attack / stroke . Patient self care activities - Over the next 180 days, patient will: o Check BP 1-2 times weekly, document, and provide at future appointments o Ensure daily salt intake < 2300 mg/day  Pulmonary embolus . Pharmacist Clinical Goal(s) o Over the next 180 days, patient will work with PharmD and providers to optimize therapy . Current regimen:  o No medications . Interventions: o Patient completed 6 months of anticoagulation following PE . Patient self care activities - Over the next 180 days, patient will: o Continue regular follow up with providers  Medication management . Pharmacist Clinical Goal(s): o Over the next 180 days, patient will work with PharmD and providers to maintain optimal medication adherence . Current pharmacy: [facility] . Interventions o Comprehensive medication review performed. o Continue current medication management strategy . Patient self care activities - Over the next 180 days, patient will: o Focus on medication adherence by fill date o Take medications as prescribed o Report any questions or concerns to PharmD and/or provider(s)  Please see past updates related to this goal by clicking on the "Past  Updates" button in the selected goal       There are no care plans to display for this patient.  The patient verbalized understanding of instructions, educational materials, and care plan provided today and declined offer to receive copy of patient instructions, educational materials, and care plan.  Telephone follow up appointment with pharmacy team member scheduled for: 6 months  Charlene Brooke, PharmD, [facility] Clinical Pharmacist [facility] [phone]

## 2020-12-14 ENCOUNTER — Telehealth: Payer: Self-pay

## 2020-12-14 NOTE — Telephone Encounter (Signed)
I received e-mail from patient's husband Ron regarding a persistent cough since her EDG at [facility]  Per Dr. Benay Spice I have emailed him back and recommended they follow up with her GI doctor as this could be related to reflux.  He stated they would do so.

## 2021-01-17 ENCOUNTER — Ambulatory Visit: Payer: PPO

## 2021-01-20 ENCOUNTER — Other Ambulatory Visit: Payer: Self-pay

## 2021-01-20 ENCOUNTER — Ambulatory Visit
Admission: RE | Admit: 2021-01-20 | Discharge: 2021-01-20 | Disposition: A | Discharge status: Home / Self Care | Payer: PPO | Source: Ambulatory Visit | Attending: Internal Medicine | Admitting: Internal Medicine

## 2021-01-20 DIAGNOSIS — Z1231 Encounter for screening mammogram for malignant neoplasm of breast: Secondary | ICD-10-CM

## 2021-01-24 ENCOUNTER — Other Ambulatory Visit: Payer: Self-pay

## 2021-01-24 ENCOUNTER — Ambulatory Visit (INDEPENDENT_AMBULATORY_CARE_PROVIDER_SITE_OTHER): Payer: PPO | Admitting: Physician Assistant

## 2021-01-24 ENCOUNTER — Encounter: Payer: Self-pay | Admitting: Physician Assistant

## 2021-01-24 DIAGNOSIS — G4700 Insomnia, unspecified: Secondary | ICD-10-CM | POA: Diagnosis not present

## 2021-01-24 DIAGNOSIS — F3341 Major depressive disorder, recurrent, in partial remission: Secondary | ICD-10-CM

## 2021-01-24 DIAGNOSIS — F411 Generalized anxiety disorder: Secondary | ICD-10-CM

## 2021-01-24 MED ORDER — VENLAFAXINE HCL ER 150 MG PO CP24: 150.0000 mg | ORAL_CAPSULE | Freq: Every day | ORAL | 3 refills | Status: DC

## 2021-01-24 MED ORDER — CLONAZEPAM 0.5 MG PO TABS: 0.5000 mg | ORAL_TABLET | Freq: Every evening | ORAL | 0 refills | Status: DC | PRN

## 2021-01-24 MED ORDER — TRAZODONE HCL 100 MG PO TABS: ORAL_TABLET | ORAL | 3 refills | Status: DC

## 2021-01-24 NOTE — Progress Notes (Signed)
[facility] Med Check  Patient ID: Jo Morrison,  MRN: [id number]  PCP: Hoyt Koch, MD  Date of Evaluation: 01/24/2021 Time spent:30 minutes  Chief Complaint:  Chief Complaint    Anxiety; Depression      HISTORY/CURRENT STATUS: HPI For annual med check.  She is doing really well.  Her husband has been sick recently with urosepsis and been in the hospital twice.  He is doing much better now.  "It is harder sometimes to know that your loved one is sick then when you yourself are."  She has gone through chemotherapy and radiation in the past year for esophageal cancer and is doing very well.  She follows up every 3 months, has biopsies and so far everything has come back negative.  The Effexor is working really well.  She rarely uses the Klonopin but it does help when needed.  She is able to enjoy things.  Energy and motivation are good.  She is working with a Physiological scientist twice a week to help her get her strength back.  She is not isolating.  Does not cry easily.  Appetite is normal.  Sleeps well most of the time.  No suicidal or homicidal thoughts.  Denies dizziness, syncope, seizures, numbness, tingling, tremor, tics, unsteady gait, slurred speech, confusion. Denies muscle or joint pain, stiffness, or dystonia.  Individual Medical History/ Review of Systems: Changes? :Yes  Had chemo and XRT in the past year for esophageal cancer.  Is doing great!   Past medications for mental health diagnoses include: Effexor XR, trazodone, Klonopin, Sonata  Allergies: Latex  Current Medications:  Current Outpatient Medications:  .  Albuterol Sulfate (PROAIR RESPICLICK) 427 (90 BASE) MCG/ACT AEPB, Inhale 2 puffs into the lungs every 4 (four) hours as needed (SOB)., Disp: 1 each, Rfl: 0 .  Bromfenac Sodium (PROLENSA) 0.07 % SOLN, Place 1 drop into the right eye 4 (four) times daily. (Patient taking differently: Place 1 drop into the right eye daily in the afternoon.), Disp: 6 mL,  Rfl: 3 .  dorzolamide-timolol (COSOPT) 22.3-6.8 MG/ML ophthalmic solution, Place 1 drop into the right eye 2 (two) times daily. , Disp: , Rfl:  .  fluticasone (FLONASE) 50 MCG/ACT nasal spray, Place 2 sprays into the nose daily as needed for allergies. , Disp: , Rfl:  .  guaifenesin (ROBITUSSIN) 100 MG/5ML syrup, Take 200 mg by mouth as needed for cough., Disp: , Rfl:  .  hydrochlorothiazide (HYDRODIURIL) 25 MG tablet, TAKE 1 TABLET(25 MG) BY MOUTH DAILY, Disp: 90 tablet, Rfl: 3 .  loteprednol (LOTEMAX) 0.5 % ophthalmic suspension, Place 1 drop into the right eye 4 (four) times daily., Disp: , Rfl:  .  methocarbamol (ROBAXIN) 500 MG tablet, Take 1 tablet (500 mg total) by mouth every 8 (eight) hours as needed for muscle spasms., Disp: 60 tablet, Rfl: 0 .  montelukast (SINGULAIR) 10 MG tablet, Take 10 mg by mouth at bedtime., Disp: , Rfl:  .  pantoprazole (PROTONIX) 40 MG tablet, Take 40 mg by mouth daily., Disp: , Rfl:  .  REFRESH OPTIVE ADVANCED 0.5-1-0.5 % SOLN, Place 1 drop into both eyes daily as needed (itching Eyes). , Disp: , Rfl:  .  sodium chloride (MURO 128) 2 % ophthalmic solution, Place 1 drop into the right eye 4 (four) times daily., Disp: , Rfl:  .  sodium chloride (MURO 128) 5 % ophthalmic ointment, Place 1 application into the right eye at bedtime. , Disp: , Rfl:  .  clonazePAM (KLONOPIN)  0.5 MG tablet, Take 1 tablet (0.5 mg total) by mouth at bedtime as needed., Disp: 30 tablet, Rfl: 0 .  gabapentin (NEURONTIN) 100 MG capsule, Take 100 mg by mouth at bedtime.  (Patient not taking: No sig reported), Disp: , Rfl: 3 .  traZODone (DESYREL) 100 MG tablet, TAKE 1 TO 2 TABLETS(100 TO 200 MG) BY MOUTH AT BEDTIME AS NEEDED FOR SLEEP, Disp: 180 tablet, Rfl: 3 .  venlafaxine XR (EFFEXOR-XR) 150 MG 24 hr capsule, Take 1 capsule (150 mg total) by mouth daily., Disp: 90 capsule, Rfl: 3 Medication Side Effects: none  Family Medical/ Social History: Changes? No  MENTAL HEALTH EXAM:  There were  no vitals taken for this visit.There is no height or weight on file to calculate BMI.  General Appearance: Casual, Neat and Well Groomed  Eye Contact:  Good  Speech:  Clear and Coherent and Normal Rate  Volume:  Normal  Mood:  Euthymic  Affect:  Appropriate  Thought Process:  Goal Directed and Descriptions of Associations: Intact  Orientation:  Full (Time, Place, and Person)  Thought Content: Logical   Suicidal Thoughts:  No  Homicidal Thoughts:  No  Memory:  WNL  Judgement:  Good  Insight:  Good  Psychomotor Activity:  Normal  Concentration:  Concentration: Good  Recall:  Good  Fund of Knowledge: Good  Language: Good  Assets:  Desire for Improvement  ADL's:  Intact  Cognition: WNL  Prognosis:  Good    DIAGNOSES:    ICD-10-CM   1. Recurrent major depressive disorder, in partial remission ([place])  F33.41   2. Generalized anxiety disorder  F41.1   3. Insomnia, unspecified type  G47.00     Receiving Psychotherapy: No    RECOMMENDATIONS:  PDMP was reviewed. I provided 30 minutes of face-to-face time during this encounter, including time spent before and after the visit and review of chart. I am glad to hear that she is doing very well since treatment of the esophageal cancer.  No changes need to be made in her treatment regimen. Continue Klonopin 0.5 mg, 1 p.o. nightly as needed. Continue Effexor X are 150 mg, 1 p.o. daily. Continue trazodone 100 mg, 1-2 nightly as needed sleep. Return in 1 year.  Donnal Moat, PA-C

## 2021-01-27 ENCOUNTER — Other Ambulatory Visit: Payer: Self-pay | Admitting: Physician Assistant

## 2021-01-31 ENCOUNTER — Other Ambulatory Visit: Payer: Self-pay

## 2021-01-31 ENCOUNTER — Encounter: Payer: Self-pay | Admitting: Nurse Practitioner

## 2021-01-31 ENCOUNTER — Inpatient Hospital Stay: Payer: PPO | Attending: Nurse Practitioner | Admitting: Nurse Practitioner

## 2021-01-31 VITALS — BP 126/75 | HR 100 | Temp 97.9°F | Resp 20 | Ht 63.0 in | Wt 105.1 lb

## 2021-01-31 DIAGNOSIS — Z923 Personal history of irradiation: Secondary | ICD-10-CM | POA: Diagnosis not present

## 2021-01-31 DIAGNOSIS — Z8501 Personal history of malignant neoplasm of esophagus: Secondary | ICD-10-CM | POA: Insufficient documentation

## 2021-01-31 DIAGNOSIS — I10 Essential (primary) hypertension: Secondary | ICD-10-CM | POA: Diagnosis not present

## 2021-01-31 DIAGNOSIS — C158 Malignant neoplasm of overlapping sites of esophagus: Secondary | ICD-10-CM | POA: Diagnosis not present

## 2021-01-31 DIAGNOSIS — Z853 Personal history of malignant neoplasm of breast: Secondary | ICD-10-CM | POA: Diagnosis not present

## 2021-01-31 DIAGNOSIS — F32A Depression, unspecified: Secondary | ICD-10-CM | POA: Diagnosis not present

## 2021-01-31 DIAGNOSIS — J45909 Unspecified asthma, uncomplicated: Secondary | ICD-10-CM | POA: Insufficient documentation

## 2021-01-31 DIAGNOSIS — Z9221 Personal history of antineoplastic chemotherapy: Secondary | ICD-10-CM | POA: Insufficient documentation

## 2021-01-31 DIAGNOSIS — Z86711 Personal history of pulmonary embolism: Secondary | ICD-10-CM | POA: Insufficient documentation

## 2021-01-31 NOTE — Progress Notes (Signed)
[place] OFFICE PROGRESS NOTE   Diagnosis: Esophagus cancer  INTERVAL HISTORY:   Ms. Almon returns as scheduled.  She feels well.  Overall she is tolerating a regular diet.  No nausea or vomiting.  No constipation or diarrhea.  "Relatively good" appetite.  No fever, cough, shortness of breath.  She is working with a Physiological scientist twice a week.  She feels she is regaining her strength.  Objective:  Vital signs in last 24 hours:  Blood pressure 126/75, pulse 100, temperature 97.9 F (36.6 C), temperature source Tympanic, resp. rate 20, height _0  (1.6 m), weight 105 lb 1.6 oz (47.7 kg), SpO2 100 %.    HEENT: Neck without mass. Lymphatics: No palpable cervical, supraclavicular or axillary lymph nodes. Resp: Lungs clear bilaterally. Cardio: Regular rate and rhythm. GI: Abdomen soft and nontender.  No hepatomegaly. Vascular: No leg edema.  Left lower leg is slightly larger than the right lower leg.  Lab Results:  Lab Results  Component Value Date   WBC 3.1 (L) 03/13/2020   HGB 13.2 03/13/2020   HCT 38.9 03/13/2020   MCV 95.1 03/13/2020   PLT 193 03/13/2020   NEUTROABS 1.9 03/13/2020    Imaging:  No results found.  Medications: I have reviewed the patient's current medications.  Assessment/Plan: 1. 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 Upper endoscopy 01/14/2020-ulcerated mass in the proximal/mid esophagus at 26-31 cm from the incisors, biopsy confirmed invasive moderately differentiated squamous cell carcinoma, PD-L1 combined positive score 0% ? CT abdomen/pelvis 12/09/2019-uterine fibroids ? CT chest 12/30/2019-normal esophagus, new sclerosis of the medial head of the right clavicle likely degenerative ? PET scan 01/24/2020-long segment of hypermetabolic thickening of the distal esophagus. For hypermetabolic metastatic lymph nodes, 3 in the mediastinum and 1 in the upper abdomen gastrohepatic ligament. ? EUS  02/10/2020-ulcerated mass in the mid esophagus,uT3, 3 suspicious lymph nodes near the primary tumor, malignant appearing lymph node at the gastrohepatic ligament (level 18)-metastatic squamous cell carcinoma ? Radiation 01/31/2020 ? Cycle 1 weekly Taxol/carboplatin 02/01/2020 ? Cycle 2 weekly Taxol/carboplatin 02/08/2020 ? Cycle 3 weekly Taxol/carboplatin 02/15/2020 ? Cycle 4 weekly Taxol/carboplatin 02/22/2020 ? Cycle 5 weekly Taxol/carboplatin 02/29/2020 ? Radiation completed 03/08/2020 ? CTs 04/03/2020-esophageal mass appears grossly smaller, regression of previously noted lymphadenopathy in the mediastinum and upper abdomen, nonocclusive pulmonary embolus right-sided pulmonary arteries ? Upper endoscopy 05/02/2020-treatment effect visible starting approximately 26 cm from the incisors.  Scope passed easily into the stomach and proximal duodenum.  Stomach and proximal duodenum grossly normal.  Scope withdrawn to the mid esophagus with four-quadrant biopsies obtained.  2 biopsy showed acute ulcerative esophagitis; 2 biopsies showed squamous mucosa with minimal histologic abnormality. ? CTs 07/24/2020-no evidence of metastatic disease, left gastric lymph node has decreased in size, mild wall thickening of the mid to distal esophagus ? Upper endoscopy 07/25/2020-multiple biopsies with changes of ulcerative esophagitis, negative fungal, HSV, and CMV stains, no evidence of malignancy ? [facility] 11/27/2020-interval decrease in prior mediastinal groundglass opacities; decrease in now mild diffuse esophageal wall thickening, most prominent in the mid and distal esophagus; few scattered 3 mm and smaller noncalcified pulmonary nodules not significantly changed.  No definite new or enlarging pulmonary nodule.  No evidence of metastatic disease in the abdomen. ? EGD 11/28/2020-mild and discrete stricture in the distal esophagus; no mass lesions seen.  Biopsies taken, negative for malignancy x4; serial dilatation  performed. 2. Right breast cancer July 1996, [name], [name], right lumpectomy and  axillary dissection followed by adjuvant Adriamycin/Taxol chemotherapy-4 cycles, 5 years of tamoxifen, and breast radiation  3.Left breast DCIS January 2011 -1.1 cm, [name], [name], left breast lumpectomy followed by adjuvant radiation and 5 years of tamoxifen  4.Depression  5.Hypertension  6.Right eye uveitis, macular edema, and corneal edema  7.Asthma  8. Pulmonary embolus on CT 04/03/2020-lovenox initiated, anticoagulation changed to apixaban beginning 08/03/2020, anticoagulation completed approximately the beginning of December 2021  9.Odynophagia secondary toradiation-resolved   Disposition: Ms. Lieurance remains in remission from esophagus cancer.  She is scheduled for a restaging evaluation at [facility] in April.  We will see her in follow-up in 6 months, sooner if needed.  Patient seen with Dr. Benay Spice.    Ned Card ANP/GNP-BC   01/31/2021  12:10 PM

## 2021-02-08 ENCOUNTER — Telehealth: Payer: Self-pay | Admitting: Internal Medicine

## 2021-02-08 NOTE — Telephone Encounter (Signed)
LVM for pt to rtn my call to schedule AWV with [facility]. Please schedule appt if pt calls the office.  

## 2021-02-16 DIAGNOSIS — J4520 Mild intermittent asthma, uncomplicated: Secondary | ICD-10-CM | POA: Diagnosis not present

## 2021-02-16 DIAGNOSIS — R053 Chronic cough: Secondary | ICD-10-CM | POA: Diagnosis not present

## 2021-02-23 ENCOUNTER — Ambulatory Visit: Payer: PPO

## 2021-02-28 ENCOUNTER — Telehealth: Payer: Self-pay | Admitting: Pharmacist

## 2021-02-28 NOTE — Progress Notes (Signed)
Chronic Care Management Pharmacy Assistant   Name: Jo Morrison  MRN: [id number] DOB: 06-Jul-1946   Reason for Encounter: General Disease State Call   Recent office visits:  None ID  Recent consult visits:  02/20/21 NP [facility] visits:  None in previous 6 months  Medications: Outpatient Encounter Medications as of 02/28/2021  Medication Sig Note  . Albuterol Sulfate (PROAIR RESPICLICK) 242 (90 BASE) MCG/ACT AEPB Inhale 2 puffs into the lungs every 4 (four) hours as needed (SOB).   . Bromfenac Sodium (PROLENSA) 0.07 % SOLN Place 1 drop into the right eye 4 (four) times daily. (Patient taking differently: Place 1 drop into the right eye daily in the afternoon.) 02/21/2020: Three times/day  . clonazePAM (KLONOPIN) 0.5 MG tablet Take 1 tablet (0.5 mg total) by mouth at bedtime as needed.   . dorzolamide-timolol (COSOPT) 22.3-6.8 MG/ML ophthalmic solution Place 1 drop into the right eye 2 (two) times daily.    . fluticasone (FLONASE) 50 MCG/ACT nasal spray Place 2 sprays into the nose daily as needed for allergies.    Marland Kitchen gabapentin (NEURONTIN) 100 MG capsule Take 100 mg by mouth at bedtime.  (Patient not taking: No sig reported)   . guaifenesin (ROBITUSSIN) 100 MG/5ML syrup Take 200 mg by mouth as needed for cough.   . hydrochlorothiazide (HYDRODIURIL) 25 MG tablet TAKE 1 TABLET(25 MG) BY MOUTH DAILY   . loteprednol (LOTEMAX) 0.5 % ophthalmic suspension Place 1 drop into the right eye 4 (four) times daily.   . methocarbamol (ROBAXIN) 500 MG tablet Take 1 tablet (500 mg total) by mouth every 8 (eight) hours as needed for muscle spasms.   . montelukast (SINGULAIR) 10 MG tablet Take 10 mg by mouth at bedtime.   . pantoprazole (PROTONIX) 40 MG tablet Take 40 mg by mouth daily.   Marland Kitchen REFRESH OPTIVE ADVANCED 0.5-1-0.5 % SOLN Place 1 drop into both eyes daily as needed (itching Eyes).    . sodium chloride (MURO 128) 2 % ophthalmic solution Place 1 drop into the right eye 4 (four) times  daily.   . sodium chloride (MURO 128) 5 % ophthalmic ointment Place 1 application into the right eye at bedtime.    . traZODone (DESYREL) 100 MG tablet TAKE 1 TO 2 TABLETS(100 TO 200 MG) BY MOUTH AT BEDTIME AS NEEDED FOR SLEEP   . venlafaxine XR (EFFEXOR-XR) 150 MG 24 hr capsule Take 1 capsule (150 mg total) by mouth daily.    No facility-administered encounter medications on file as of 02/28/2021.    Pharmacists Review:  Have you had any recent problems with your health? The patient states that she has not had any recent problems with her health, she is doing just fine and she has a wellness visit coming up soon.  Have you had any problems with your pharmacy? The patient states that she has not had any problems with getting medications or the cost of medications.  What issues or side effects are you having with your medications? The patient stated that she is not having any issues with her medications or any side effects.  Is there anything you would like to pass along to [facility], CPP for her to help you with? The patient stated that she is not having any problems or concerns at this time  What can we do to take better care of you? The patient states that's she is doing great right now   Star Rating Drugs: No ACE/ARB  [place] Pharmacist Assistant [phone]  Time spent:21

## 2021-03-01 DIAGNOSIS — H2011 Chronic iridocyclitis, right eye: Secondary | ICD-10-CM | POA: Diagnosis not present

## 2021-03-01 DIAGNOSIS — Z961 Presence of intraocular lens: Secondary | ICD-10-CM | POA: Diagnosis not present

## 2021-03-01 DIAGNOSIS — H35351 Cystoid macular degeneration, right eye: Secondary | ICD-10-CM | POA: Diagnosis not present

## 2021-03-01 DIAGNOSIS — H26491 Other secondary cataract, right eye: Secondary | ICD-10-CM | POA: Diagnosis not present

## 2021-03-01 DIAGNOSIS — H18231 Secondary corneal edema, right eye: Secondary | ICD-10-CM | POA: Diagnosis not present

## 2021-03-04 ENCOUNTER — Other Ambulatory Visit (INDEPENDENT_AMBULATORY_CARE_PROVIDER_SITE_OTHER): Payer: Self-pay | Admitting: Ophthalmology

## 2021-03-05 ENCOUNTER — Other Ambulatory Visit (INDEPENDENT_AMBULATORY_CARE_PROVIDER_SITE_OTHER): Payer: Self-pay | Admitting: Ophthalmology

## 2021-03-05 DIAGNOSIS — Z923 Personal history of irradiation: Secondary | ICD-10-CM | POA: Diagnosis not present

## 2021-03-05 DIAGNOSIS — F419 Anxiety disorder, unspecified: Secondary | ICD-10-CM | POA: Diagnosis not present

## 2021-03-05 DIAGNOSIS — C154 Malignant neoplasm of middle third of esophagus: Secondary | ICD-10-CM | POA: Diagnosis not present

## 2021-03-05 DIAGNOSIS — Z7901 Long term (current) use of anticoagulants: Secondary | ICD-10-CM | POA: Diagnosis not present

## 2021-03-05 DIAGNOSIS — I2693 Single subsegmental pulmonary embolism without acute cor pulmonale: Secondary | ICD-10-CM | POA: Diagnosis not present

## 2021-03-05 DIAGNOSIS — R053 Chronic cough: Secondary | ICD-10-CM | POA: Diagnosis not present

## 2021-03-05 DIAGNOSIS — C50511 Malignant neoplasm of lower-outer quadrant of right female breast: Secondary | ICD-10-CM | POA: Diagnosis not present

## 2021-03-05 DIAGNOSIS — R636 Underweight: Secondary | ICD-10-CM | POA: Diagnosis not present

## 2021-03-05 DIAGNOSIS — Z9013 Acquired absence of bilateral breasts and nipples: Secondary | ICD-10-CM | POA: Diagnosis not present

## 2021-03-05 DIAGNOSIS — C158 Malignant neoplasm of overlapping sites of esophagus: Secondary | ICD-10-CM | POA: Diagnosis not present

## 2021-03-05 DIAGNOSIS — Z853 Personal history of malignant neoplasm of breast: Secondary | ICD-10-CM | POA: Diagnosis not present

## 2021-03-05 DIAGNOSIS — Z79899 Other long term (current) drug therapy: Secondary | ICD-10-CM | POA: Diagnosis not present

## 2021-03-05 DIAGNOSIS — J4520 Mild intermittent asthma, uncomplicated: Secondary | ICD-10-CM | POA: Diagnosis not present

## 2021-03-05 DIAGNOSIS — Z8501 Personal history of malignant neoplasm of esophagus: Secondary | ICD-10-CM | POA: Diagnosis not present

## 2021-03-05 DIAGNOSIS — K229 Disease of esophagus, unspecified: Secondary | ICD-10-CM | POA: Diagnosis not present

## 2021-03-05 DIAGNOSIS — Z08 Encounter for follow-up examination after completed treatment for malignant neoplasm: Secondary | ICD-10-CM | POA: Diagnosis not present

## 2021-03-05 DIAGNOSIS — I10 Essential (primary) hypertension: Secondary | ICD-10-CM | POA: Diagnosis not present

## 2021-03-05 DIAGNOSIS — Z9221 Personal history of antineoplastic chemotherapy: Secondary | ICD-10-CM | POA: Diagnosis not present

## 2021-03-05 DIAGNOSIS — R918 Other nonspecific abnormal finding of lung field: Secondary | ICD-10-CM | POA: Diagnosis not present

## 2021-03-06 DIAGNOSIS — Z853 Personal history of malignant neoplasm of breast: Secondary | ICD-10-CM | POA: Diagnosis not present

## 2021-03-06 DIAGNOSIS — Z923 Personal history of irradiation: Secondary | ICD-10-CM | POA: Diagnosis not present

## 2021-03-06 DIAGNOSIS — Z7951 Long term (current) use of inhaled steroids: Secondary | ICD-10-CM | POA: Diagnosis not present

## 2021-03-06 DIAGNOSIS — Z9221 Personal history of antineoplastic chemotherapy: Secondary | ICD-10-CM | POA: Diagnosis not present

## 2021-03-06 DIAGNOSIS — Z08 Encounter for follow-up examination after completed treatment for malignant neoplasm: Secondary | ICD-10-CM | POA: Diagnosis not present

## 2021-03-06 DIAGNOSIS — I10 Essential (primary) hypertension: Secondary | ICD-10-CM | POA: Diagnosis not present

## 2021-03-06 DIAGNOSIS — Z79899 Other long term (current) drug therapy: Secondary | ICD-10-CM | POA: Diagnosis not present

## 2021-03-06 DIAGNOSIS — J4520 Mild intermittent asthma, uncomplicated: Secondary | ICD-10-CM | POA: Diagnosis not present

## 2021-03-06 DIAGNOSIS — Z8501 Personal history of malignant neoplasm of esophagus: Secondary | ICD-10-CM | POA: Diagnosis not present

## 2021-03-06 DIAGNOSIS — K2289 Other specified disease of esophagus: Secondary | ICD-10-CM | POA: Diagnosis not present

## 2021-03-06 DIAGNOSIS — K222 Esophageal obstruction: Secondary | ICD-10-CM | POA: Diagnosis not present

## 2021-03-06 DIAGNOSIS — Z86711 Personal history of pulmonary embolism: Secondary | ICD-10-CM | POA: Diagnosis not present

## 2021-03-06 DIAGNOSIS — J45909 Unspecified asthma, uncomplicated: Secondary | ICD-10-CM | POA: Diagnosis not present

## 2021-03-09 ENCOUNTER — Ambulatory Visit: Payer: PPO

## 2021-03-12 ENCOUNTER — Other Ambulatory Visit: Payer: Self-pay

## 2021-03-12 ENCOUNTER — Ambulatory Visit (INDEPENDENT_AMBULATORY_CARE_PROVIDER_SITE_OTHER): Payer: PPO

## 2021-03-12 VITALS — BP 120/60 | HR 99 | Temp 98.0°F | Ht 63.0 in | Wt 104.6 lb

## 2021-03-12 DIAGNOSIS — Z0000 Encounter for general adult medical examination without abnormal findings: Secondary | ICD-10-CM | POA: Diagnosis not present

## 2021-03-12 NOTE — Patient Instructions (Signed)
[name] , Thank you for taking time to come for your Medicare Wellness Visit. I appreciate your ongoing commitment to your health goals. Please review the following plan we discussed and let me know if I can assist you in the future.   Screening recommendations/referrals: Colonoscopy: 03/12/2018; due every 5 years Mammogram: 01/20/2021 Bone Density: 06/21/2014; normal results (completed) Recommended yearly ophthalmology/optometry visit for glaucoma screening and checkup Recommended yearly dental visit for hygiene and checkup  Vaccinations: Influenza vaccine: 08/20/2020 Pneumococcal vaccine: 06/16/2013, 12/13/2014 Tdap vaccine: 08/10/2015; due every 10 years Shingles vaccine: 06/22/2017, 09/10/2017   Covid-19: 12/05/2019, 12/26/2019, 08/03/2020  Advanced directives: Documents on file.  Conditions/risks identified: Yes; Reviewed health maintenance screenings with patient today and relevant education, vaccines, and/or referrals were provided. Please continue to do your personal lifestyle choices by: daily care of teeth and gums, regular physical activity (goal should be 5 days a week for 30 minutes), eat a healthy diet, avoid tobacco and drug use, limiting any alcohol intake, taking a low-dose aspirin (if not allergic or have been advised by your provider otherwise) and taking vitamins and minerals as recommended by your provider. Continue doing brain stimulating activities (puzzles, reading, adult coloring books, staying active) to keep memory sharp. Continue to eat heart healthy diet (full of fruits, vegetables, whole grains, lean protein, water--limit salt, fat, and sugar intake) and increase physical activity as tolerated.  Next appointment: Please schedule your next Medicare Wellness Visit with your Nurse Health Advisor in 1 year by calling [phone].  Preventive Care 26 Years and Older, Female Preventive care refers to lifestyle choices and visits with your health care provider that can promote  health and wellness. What does preventive care include?  A yearly physical exam. This is also called an annual well check.  Dental exams once or twice a year.  Routine eye exams. Ask your health care provider how often you should have your eyes checked.  Personal lifestyle choices, including:  Daily care of your teeth and gums.  Regular physical activity.  Eating a healthy diet.  Avoiding tobacco and drug use.  Limiting alcohol use.  Practicing safe sex.  Taking low-dose aspirin every day.  Taking vitamin and mineral supplements as recommended by your health care provider. What happens during an annual well check? The services and screenings done by your health care provider during your annual well check will depend on your age, overall health, lifestyle risk factors, and family history of disease. Counseling  Your health care provider may ask you questions about your:  Alcohol use.  Tobacco use.  Drug use.  Emotional well-being.  Home and relationship well-being.  Sexual activity.  Eating habits.  History of falls.  Memory and ability to understand (cognition).  Work and work Statistician.  Reproductive health. Screening  You may have the following tests or measurements:  Height, weight, and BMI.  Blood pressure.  Lipid and cholesterol levels. These may be checked every 5 years, or more frequently if you are over 57 years old.  Skin check.  Lung cancer screening. You may have this screening every year starting at age 58 if you have a 30-pack-year history of smoking and currently smoke or have quit within the past 15 years.  Fecal occult blood test (FOBT) of the stool. You may have this test every year starting at age 63.  Flexible sigmoidoscopy or colonoscopy. You may have a sigmoidoscopy every 5 years or a colonoscopy every 10 years starting at age 68.  Hepatitis C blood test.  Hepatitis B blood test.  Sexually transmitted disease (STD)  testing.  Diabetes screening. This is done by checking your blood sugar (glucose) after you have not eaten for a while (fasting). You may have this done every 1-3 years.  Bone density scan. This is done to screen for osteoporosis. You may have this done starting at age 64.  Mammogram. This may be done every 1-2 years. Talk to your health care provider about how often you should have regular mammograms. Talk with your health care provider about your test results, treatment options, and if necessary, the need for more tests. Vaccines  Your health care provider may recommend certain vaccines, such as:  Influenza vaccine. This is recommended every year.  Tetanus, diphtheria, and acellular pertussis (Tdap, Td) vaccine. You may need a Td booster every 10 years.  Zoster vaccine. You may need this after age 46.  Pneumococcal 13-valent conjugate (PCV13) vaccine. One dose is recommended after age 45.  Pneumococcal polysaccharide (PPSV23) vaccine. One dose is recommended after age 63. Talk to your health care provider about which screenings and vaccines you need and how often you need them. This information is not intended to replace advice given to you by your health care provider. Make sure you discuss any questions you have with your health care provider. Document Released: 12/08/2015 Document Revised: 07/31/2016 Document Reviewed: 09/12/2015 Elsevier Interactive Patient Education  2017 [employer] Prevention in the Home Falls can cause injuries. They can happen to people of all ages. There are many things you can do to make your home safe and to help prevent falls. What can I do on the outside of my home?  Regularly fix the edges of walkways and driveways and fix any cracks.  Remove anything that might make you trip as you walk through a door, such as a raised step or threshold.  Trim any bushes or trees on the path to your home.  Use bright outdoor lighting.  Clear any walking  paths of anything that might make someone trip, such as rocks or tools.  Regularly check to see if handrails are loose or broken. Make sure that both sides of any steps have handrails.  Any raised decks and porches should have guardrails on the edges.  Have any leaves, snow, or ice cleared regularly.  Use sand or salt on walking paths during winter.  Clean up any spills in your garage right away. This includes oil or grease spills. What can I do in the bathroom?  Use night lights.  Install grab bars by the toilet and in the tub and shower. Do not use towel bars as grab bars.  Use non-skid mats or decals in the tub or shower.  If you need to sit down in the shower, use a plastic, non-slip stool.  Keep the floor dry. Clean up any water that spills on the floor as soon as it happens.  Remove soap buildup in the tub or shower regularly.  Attach bath mats securely with double-sided non-slip rug tape.  Do not have throw rugs and other things on the floor that can make you trip. What can I do in the bedroom?  Use night lights.  Make sure that you have a light by your bed that is easy to reach.  Do not use any sheets or blankets that are too big for your bed. They should not hang down onto the floor.  Have a firm chair that has side arms. You can use this  for support while you get dressed.  Do not have throw rugs and other things on the floor that can make you trip. What can I do in the kitchen?  Clean up any spills right away.  Avoid walking on wet floors.  Keep items that you use a lot in easy-to-reach places.  If you need to reach something above you, use a strong step stool that has a grab bar.  Keep electrical cords out of the way.  Do not use floor polish or wax that makes floors slippery. If you must use wax, use non-skid floor wax.  Do not have throw rugs and other things on the floor that can make you trip. What can I do with my stairs?  Do not leave any items  on the stairs.  Make sure that there are handrails on both sides of the stairs and use them. Fix handrails that are broken or loose. Make sure that handrails are as long as the stairways.  Check any carpeting to make sure that it is firmly attached to the stairs. Fix any carpet that is loose or worn.  Avoid having throw rugs at the top or bottom of the stairs. If you do have throw rugs, attach them to the floor with carpet tape.  Make sure that you have a light switch at the top of the stairs and the bottom of the stairs. If you do not have them, ask someone to add them for you. What else can I do to help prevent falls?  Wear shoes that:  Do not have high heels.  Have rubber bottoms.  Are comfortable and fit you well.  Are closed at the toe. Do not wear sandals.  If you use a stepladder:  Make sure that it is fully opened. Do not climb a closed stepladder.  Make sure that both sides of the stepladder are locked into place.  Ask someone to hold it for you, if possible.  Clearly mark and make sure that you can see:  Any grab bars or handrails.  First and last steps.  Where the edge of each step is.  Use tools that help you move around (mobility aids) if they are needed. These include:  Canes.  Walkers.  Scooters.  Crutches.  Turn on the lights when you go into a dark area. Replace any light bulbs as soon as they burn out.  Set up your furniture so you have a clear path. Avoid moving your furniture around.  If any of your floors are uneven, fix them.  If there are any pets around you, be aware of where they are.  Review your medicines with your doctor. Some medicines can make you feel dizzy. This can increase your chance of falling. Ask your doctor what other things that you can do to help prevent falls. This information is not intended to replace advice given to you by your health care provider. Make sure you discuss any questions you have with your health care  provider. Document Released: 09/07/2009 Document Revised: 04/18/2016 Document Reviewed: 12/16/2014 Elsevier Interactive Patient Education  2017 [employer].

## 2021-03-12 NOTE — Progress Notes (Signed)
Subjective:   Jo Morrison is a 75 y.o. female who presents for Medicare Annual (Subsequent) preventive examination.  Review of Systems    No ROS. Medicare Wellness Visit. Additional risk factors are reflected in social history.  Cardiac Risk Factors include: advanced age (>77men, >73 women);family history of premature cardiovascular disease;hypertension Sleep Patterns: No sleep issues, feels rested on waking and sleeps 8 hours nightly. Home Safety/Smoke Alarms: Feels safe in home; uses home alarm. Smoke alarms in place. Living environment: 2-story home; Lives with spouse; no needs for DME; good support system. Seat Belt Safety/Bike Helmet: Wears seat belt.    Objective:    Today's Vitals   03/12/21 1406  BP: 120/60  Pulse: 99  Temp: 98 F (36.7 C)  SpO2: 98%  Weight: 104 lb 9.6 oz (47.4 kg)  Height: 5\' 3"  (1.6 m)  PainSc: 0-No pain   Body mass index is 18.53 kg/m.  Advanced Directives 03/12/2021 10/03/2020 05/12/2020 04/12/2020 03/20/2020 02/28/2020 02/10/2020  Does Patient Have a Medical Advance Directive? Yes Yes Yes Yes Yes Yes Yes  Type of Advance Directive - [place];Living will [place];Living will [place];Living will Living will;Healthcare Power of Attorney Living will;Healthcare Power of [place];Living will  Does patient want to make changes to medical advance directive? No - Patient declined No - Patient declined No - Patient declined No - Patient declined - - -  Copy of [place] in Chart? - Yes - validated most recent copy scanned in chart (See row information) Yes - validated most recent copy scanned in chart (See row information) No - copy requested - - Yes - validated most recent copy scanned in chart (See row information)    Current Medications (verified) Outpatient Encounter Medications as of 03/12/2021  Medication Sig  . Albuterol Sulfate (PROAIR  RESPICLICK) 952 (90 BASE) MCG/ACT AEPB Inhale 2 puffs into the lungs every 4 (four) hours as needed (SOB).  . Bromfenac Sodium (PROLENSA) 0.07 % SOLN Place 1 drop into the right eye 4 (four) times daily. (Patient taking differently: Place 1 drop into the right eye daily in the afternoon.)  . clonazePAM (KLONOPIN) 0.5 MG tablet Take 1 tablet (0.5 mg total) by mouth at bedtime as needed.  . dorzolamide-timolol (COSOPT) 22.3-6.8 MG/ML ophthalmic solution Place 1 drop into the right eye 2 (two) times daily.   . fluticasone (FLONASE) 50 MCG/ACT nasal spray Place 2 sprays into the nose daily as needed for allergies.   Marland Kitchen gabapentin (NEURONTIN) 100 MG capsule Take 100 mg by mouth at bedtime.  (Patient not taking: No sig reported)  . guaifenesin (ROBITUSSIN) 100 MG/5ML syrup Take 200 mg by mouth as needed for cough.  . hydrochlorothiazide (HYDRODIURIL) 25 MG tablet TAKE 1 TABLET(25 MG) BY MOUTH DAILY  . loteprednol (LOTEMAX) 0.5 % ophthalmic suspension Place 1 drop into the right eye 4 (four) times daily.  . methocarbamol (ROBAXIN) 500 MG tablet Take 1 tablet (500 mg total) by mouth every 8 (eight) hours as needed for muscle spasms.  . montelukast (SINGULAIR) 10 MG tablet Take 10 mg by mouth at bedtime.  . pantoprazole (PROTONIX) 40 MG tablet Take 40 mg by mouth daily.  Marland Kitchen REFRESH OPTIVE ADVANCED 0.5-1-0.5 % SOLN Place 1 drop into both eyes daily as needed (itching Eyes).   . sodium chloride (MURO 128) 2 % ophthalmic solution Place 1 drop into the right eye 4 (four) times daily.  . sodium chloride (  MURO 128) 5 % ophthalmic ointment Place 1 application into the right eye at bedtime.   . traZODone (DESYREL) 100 MG tablet TAKE 1 TO 2 TABLETS(100 TO 200 MG) BY MOUTH AT BEDTIME AS NEEDED FOR SLEEP  . venlafaxine XR (EFFEXOR-XR) 150 MG 24 hr capsule Take 1 capsule (150 mg total) by mouth daily.   No facility-administered encounter medications on file as of 03/12/2021.    Allergies (verified) Latex    History: Past Medical History:  Diagnosis Date  . Allergy   . Anxiety   . Asthma   . Breast cancer ([place]) 2012   left  . Breast cancer, right ([place]) 1996   right  . Cataract    cataracts removed  . Chronic uveitis of right eye   . Colon polyps   . Corneal edema of right eye   . Cystoid macular edema of right eye   . Depressive disorder   . DVT (deep venous thrombosis) ([place])   . Family history of colon cancer   . Glaucoma    not on eye drops at this time.  . Hypertension   . Hypertensive retinopathy of both eyes   . Olecranon bursitis   . Osteoarthritis   . Other disorders of bone and cartilage(733.99)   . Pseudoexfoliation (PXF) glaucoma of both eyes   . Pseudophakia, both eyes   . Seasonal allergies   . Unspecified pruritic disorder    Past Surgical History:  Procedure Laterality Date  . APPENDECTOMY  1958  . BREAST LUMPECTOMY Right 1996   Dr. Corena Herter in [place]  . BREAST LUMPECTOMY Left 2011   Dr. Pleas Patricia  . cataract Bilateral   . CATARACT EXTRACTION Bilateral    Dr. Lucita Ferrara  . COLONOSCOPY  2011-last  . DUPUYTREN CONTRACTURE RELEASE    . ESOPHAGOGASTRODUODENOSCOPY (EGD) WITH PROPOFOL N/A 02/10/2020   Procedure: ESOPHAGOGASTRODUODENOSCOPY (EGD) WITH PROPOFOL;  Surgeon: Milus Banister, MD;  Location: [facility];  Service: Endoscopy;  Laterality: N/A;  . FINE NEEDLE ASPIRATION N/A 02/10/2020   Procedure: FINE NEEDLE ASPIRATION (FNA) LINEAR;  Surgeon: Milus Banister, MD;  Location: [facility];  Service: Endoscopy;  Laterality: N/A;  . HAND SURGERY Right 2020  . LASIK Bilateral 2007  . POLYPECTOMY    . ROTATOR CUFF REPAIR  2007  . TONSILLECTOMY  1967  . UPPER ESOPHAGEAL ENDOSCOPIC ULTRASOUND (EUS) N/A 02/10/2020   Procedure: UPPER ESOPHAGEAL ENDOSCOPIC ULTRASOUND (EUS);  Surgeon: Milus Banister, MD;  Location: [facility];  Service: Endoscopy;  Laterality: N/A;   Family History  Problem Relation Age of Onset  . Stroke Father 69  . Heart  disease Brother   . Colon cancer Maternal Aunt 40  . Macular degeneration Mother   . Cancer Maternal Grandmother        unknown type  . Colon polyps Neg Hx   . Esophageal cancer Neg Hx   . Rectal cancer Neg Hx   . Stomach cancer Neg Hx    Social History   Socioeconomic History  . Marital status: Married    Spouse name: Not on file  . Number of children: 0  . Years of education: Not on file  . Highest education level: Not on file  Occupational History  . Not on file  Tobacco Use  . Smoking status: Never Smoker  . Smokeless tobacco: Never Used  Vaping Use  . Vaping Use: Never used  Substance and Sexual Activity  . Alcohol use: Yes    Alcohol/week: 6.0 standard  drinks    Types: 6 Glasses of wine per week  . Drug use: No  . Sexual activity: Yes  Other Topics Concern  . Not on file  Social History Narrative   Married no children   Wine 3 to 4 glasses a day   Never smoker   No drug use   Social Determinants of Radio broadcast assistant Strain: Low Risk   . Difficulty of Paying Living Expenses: Not hard at all  Food Insecurity: No Food Insecurity  . Worried About Charity fundraiser in the Last Year: Never true  . Ran Out of Food in the Last Year: Never true  Transportation Needs: No Transportation Needs  . Lack of Transportation (Medical): No  . Lack of Transportation (Non-Medical): No  Physical Activity: Sufficiently Active  . Days of Exercise per Week: 5 days  . Minutes of Exercise per Session: 30 min  Stress: No Stress Concern Present  . Feeling of Stress : Not at all  Social Connections: Socially Integrated  . Frequency of Communication with Friends and Family: More than three times a week  . Frequency of Social Gatherings with Friends and Family: Once a week  . Attends Religious Services: More than 4 times per year  . Active Member of Clubs or Organizations: Yes  . Attends Archivist Meetings: More than 4 times per year  . Marital Status: Married     Tobacco Counseling Counseling given: Not Answered   Clinical Intake:  Pre-visit preparation completed: Yes  Pain : No/denies pain Pain Score: 0-No pain     BMI - recorded: 18.53 Nutritional Status: BMI <19  Underweight Nutritional Risks: None Diabetes: No  How often do you need to have someone help you when you read instructions, pamphlets, or other written materials from your doctor or pharmacy?: 1 - Never What is the last grade level you completed in school?: Bachelor's Degree  Diabetic? no  Interpreter Needed?: No  Information entered by :: Lisette Abu, LPN   Activities of Daily Living In your present state of health, do you have any difficulty performing the following activities: 03/12/2021  Hearing? N  Vision? N  Difficulty concentrating or making decisions? N  Walking or climbing stairs? N  Dressing or bathing? N  Doing errands, shopping? N  Preparing Food and eating ? N  Using the Toilet? N  In the past six months, have you accidently leaked urine? N  Do you have problems with loss of bowel control? N  Managing your Medications? N  Managing your Finances? N  Housekeeping or managing your Housekeeping? N  Some recent data might be hidden    Patient Care Team: Hoyt Koch, MD as PCP - General (Internal Medicine) Nicholas Lose, MD as Consulting Physician (Hematology and Oncology) Gatha Mayer, MD as Consulting Physician (Gastroenterology) Jonnie Finner, RN as Oncology Nurse Navigator Charlton Haws, [facility] as Pharmacist (Pharmacist) Ladell Pier, MD as Consulting Physician (Oncology)  Indicate any recent Medical Services you may have received from other than [facility] providers in the past year (date may be approximate).     Assessment:   This is a routine wellness examination for Jo Morrison.  Hearing/Vision screen No exam data present  Dietary issues and exercise activities discussed: Current Exercise Habits: Home exercise  routine;Structured exercise class, Type of exercise: walking;Other - see comments (Personal trainer two times a week), Time (Minutes): 35, Frequency (Times/Week): 5, Weekly Exercise (Minutes/Week): 175, Intensity: Moderate, Exercise limited by:  respiratory conditions(s);psychological condition(s)  Goals    . Patient Stated     My goal is to gain 5-6 pounds and to maintain how good I feel.    Marland Kitchen Pharmacy Care Plan     CARE PLAN ENTRY (see longitudinal plan of care for additional care plan information)  Current Barriers:  . Chronic Disease Management support, education, and care coordination needs related to Hypertension, Pulmonary embolus   Hypertension BP Readings from Last 3 Encounters:  05/12/20 132/84  04/12/20 130/85  04/07/20 (!) 144/85 .  Pharmacist Clinical Goal(s): o Over the next 180 days, patient will work with PharmD and providers to maintain BP goal <140/90 . Current regimen:  o HCTZ 25 mg daily  . Interventions: o Discussed BP goals and benefits of medication for prevention of heart attack / stroke . Patient self care activities - Over the next 180 days, patient will: o Check BP 1-2 times weekly, document, and provide at future appointments o Ensure daily salt intake < 2300 mg/day  Pulmonary embolus . Pharmacist Clinical Goal(s) o Over the next 180 days, patient will work with PharmD and providers to optimize therapy . Current regimen:  o No medications . Interventions: o Patient completed 6 months of anticoagulation following PE . Patient self care activities - Over the next 180 days, patient will: o Continue regular follow up with providers  Medication management . Pharmacist Clinical Goal(s): o Over the next 180 days, patient will work with PharmD and providers to maintain optimal medication adherence . Current pharmacy: [facility] . Interventions o Comprehensive medication review performed. o Continue current medication management strategy . Patient self  care activities - Over the next 180 days, patient will: o Focus on medication adherence by fill date o Take medications as prescribed o Report any questions or concerns to PharmD and/or provider(s)  Please see past updates related to this goal by clicking on the "Past Updates" button in the selected goal       Depression Screen PHQ 2/9 Scores 03/12/2021 09/24/2019 10/08/2017 07/08/2017 02/07/2016  PHQ - 2 Score 0 0 0 0 0  PHQ- 9 Score - 0 0 - -    Fall Risk Fall Risk  03/12/2021 09/24/2019 06/24/2019 10/08/2017 07/08/2017  Falls in the past year? 1 0 0 No No  Comment - - Emmi Telephone Survey: data to providers prior to load - -  Number falls in past yr: 1 - - - -  Injury with Fall? 0 - - - -  Risk for fall due to : History of fall(s) - - - -  Follow up Falls evaluation completed - - - -    FALL RISK PREVENTION PERTAINING TO THE HOME:  Any stairs in or around the home? Yes  If so, are there any without handrails? No  Home free of loose throw rugs in walkways, pet beds, electrical cords, etc? Yes  Adequate lighting in your home to reduce risk of falls? Yes   ASSISTIVE DEVICES UTILIZED TO PREVENT FALLS:  Life alert? Yes  Use of a cane, walker or w/c? No  Grab bars in the bathroom? No  Shower chair or bench in shower? No  Elevated toilet seat or a handicapped toilet? Yes   TIMED UP AND GO:  Was the test performed? No .  Length of time to ambulate 10 feet: 0 sec.   Gait steady and fast without use of assistive device  Cognitive Function: Normal cognitive status assessed by direct observation by this Nurse Health  Advisor. No abnormalities found.          Immunizations Immunization History  Administered Date(s) Administered  . Influenza Split 08/20/2020  . Influenza, High Dose Seasonal PF 08/09/2016, 09/09/2017, 09/07/2018, 08/01/2019  . Influenza,inj,Quad PF,6+ Mos 08/10/2015  . Influenza-Unspecified 08/01/2019, 02/18/2020, 08/20/2020  . PFIZER(Purple Top)SARS-COV-2  Vaccination 12/05/2019, 12/26/2019, 08/03/2020  . Pneumococcal Conjugate-13 12/13/2014  . Pneumococcal Polysaccharide-23 06/16/2013  . Tdap 11/25/1998, 08/10/2015  . Zoster 05/27/2012  . Zoster Recombinat (Shingrix) 06/22/2017, 09/10/2017    TDAP status: Up to date  Flu Vaccine status: Up to date  Pneumococcal vaccine status: Up to date  Covid-19 vaccine status: Completed vaccines  Qualifies for Shingles Vaccine? Yes   Zostavax completed Yes   Shingrix Completed?: Yes  Screening Tests Health Maintenance  Topic Date Due  . COVID-19 Vaccine (4 - Booster for Pfizer series) 01/31/2021  . Hepatitis C Screening  08/16/2026 (Originally 17-Dec-1945)  . INFLUENZA VACCINE  06/25/2021  . COLONOSCOPY (Pts 45-52yrs Insurance coverage will need to be confirmed)  03/13/2023  . TETANUS/TDAP  08/09/2025  . DEXA SCAN  Completed  . PNA vac Low Risk Adult  Completed  . HPV VACCINES  Aged Out    Health Maintenance  Health Maintenance Due  Topic Date Due  . COVID-19 Vaccine (4 - Booster for Pfizer series) 01/31/2021    Colorectal cancer screening: Type of screening: Colonoscopy. Completed 03/12/2018. Repeat every 5 years  Mammogram status: Completed 01/20/2021. Repeat every year  Bone Density status: Completed 06/21/2014. Results reflect: Bone density results: NORMAL. Repeat every n/a years.  (Completed)  Lung Cancer Screening: (Low Dose CT Chest recommended if Age 40-80 years, 30 pack-year currently smoking OR have quit w/in 15years.) does not qualify.   Lung Cancer Screening Referral: no  Additional Screening:  Hepatitis C Screening: does qualify; Completed no  Vision Screening: Recommended annual ophthalmology exams for early detection of glaucoma and other disorders of the eye. Is the patient up to date with their annual eye exam?  Yes  Who is the provider or what is the name of the office in which the patient attends annual eye exams? [facility] If pt is not established with a  provider, would they like to be referred to a provider to establish care? No .   Dental Screening: Recommended annual dental exams for proper oral hygiene  Community Resource Referral / Chronic Care Management: CRR required this visit?  No   CCM required this visit?  No      Plan:     I have personally reviewed and noted the following in the patient's chart:   . Medical and social history . Use of alcohol, tobacco or illicit drugs  . Current medications and supplements . Functional ability and status . Nutritional status . Physical activity . Advanced directives . List of other physicians . Hospitalizations, surgeries, and ER visits in previous 12 months . Vitals . Screenings to include cognitive, depression, and falls . Referrals and appointments  In addition, I have reviewed and discussed with patient certain preventive protocols, quality metrics, and best practice recommendations. A written personalized care plan for preventive services as well as general preventive health recommendations were provided to patient.     Sheral Flow, LPN   [id number]   Nurse Notes:  Medications reviewed with patient; no opioid use noted.

## 2021-03-26 IMAGING — DX DG HIP (WITH OR WITHOUT PELVIS) 2-3V*R*
3 series · 3 of 3 positions shown · non-contrast
Comparison: None.

CLINICAL DATA: Pain

EXAM:
DG HIP (WITH OR WITHOUT PELVIS) 2-3V RIGHT

[pelvis ap]
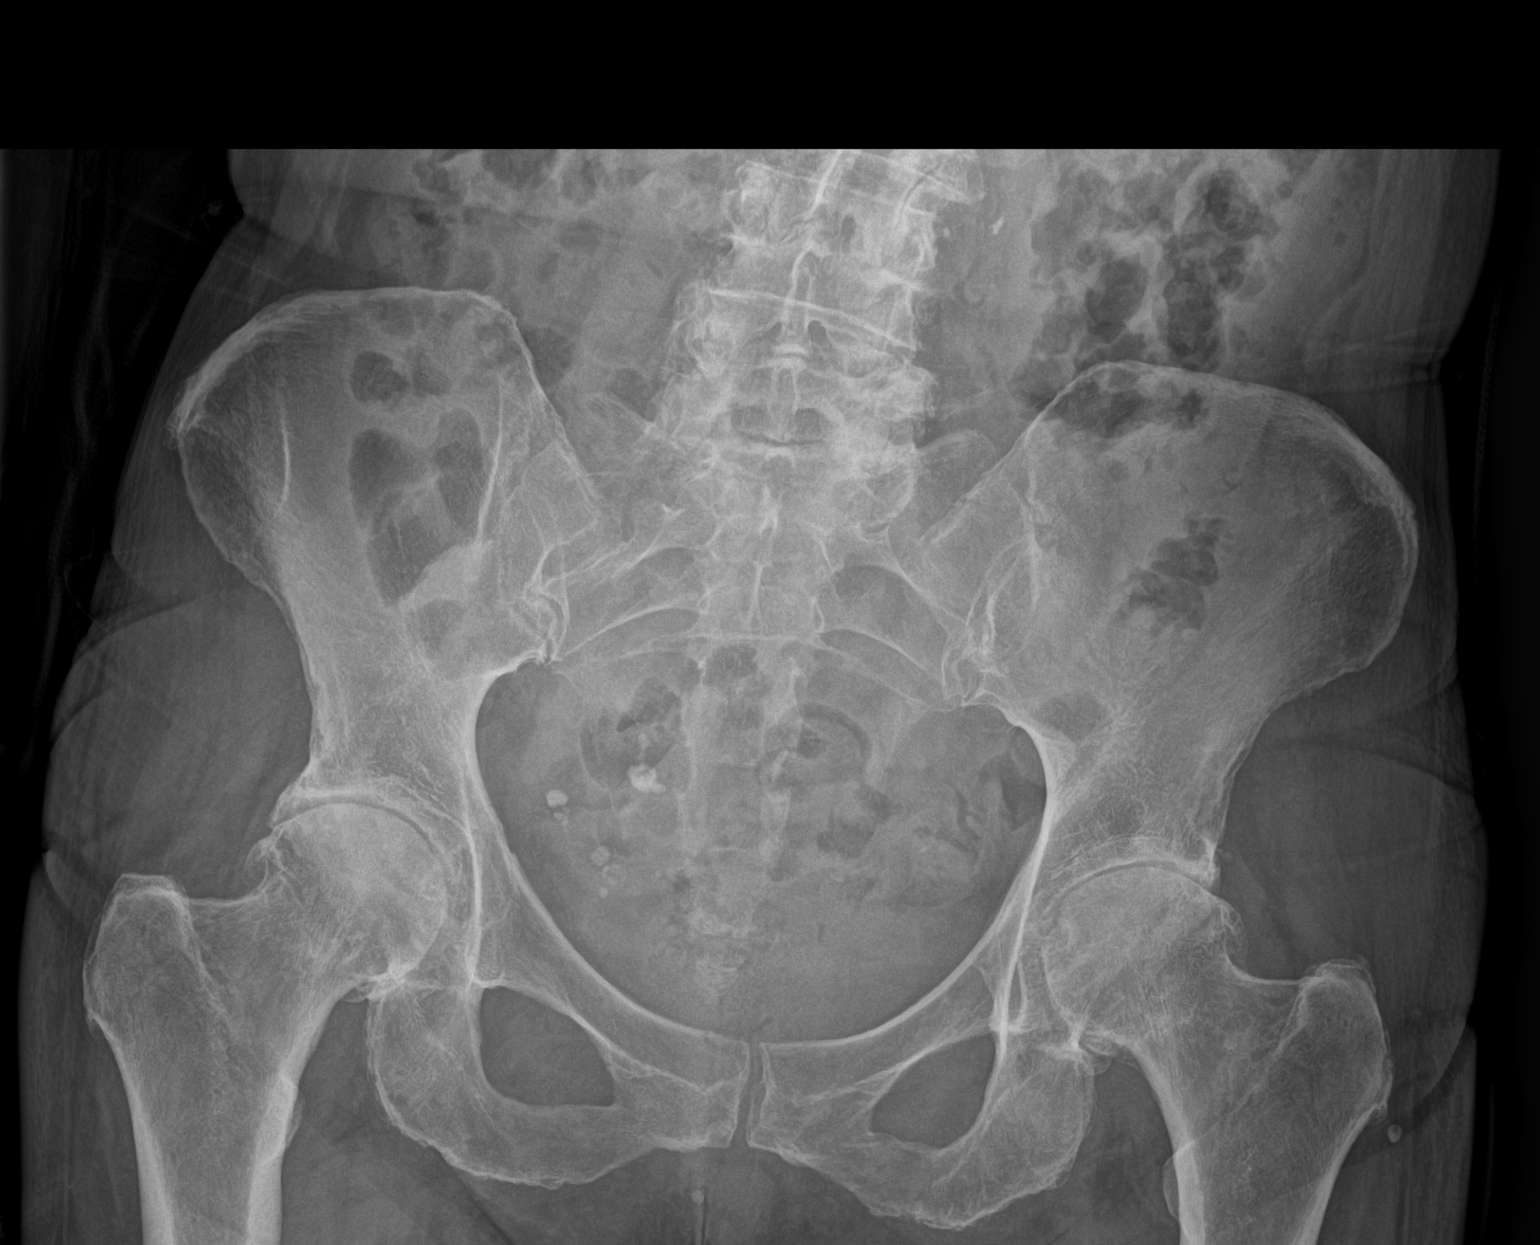

[hip ap]
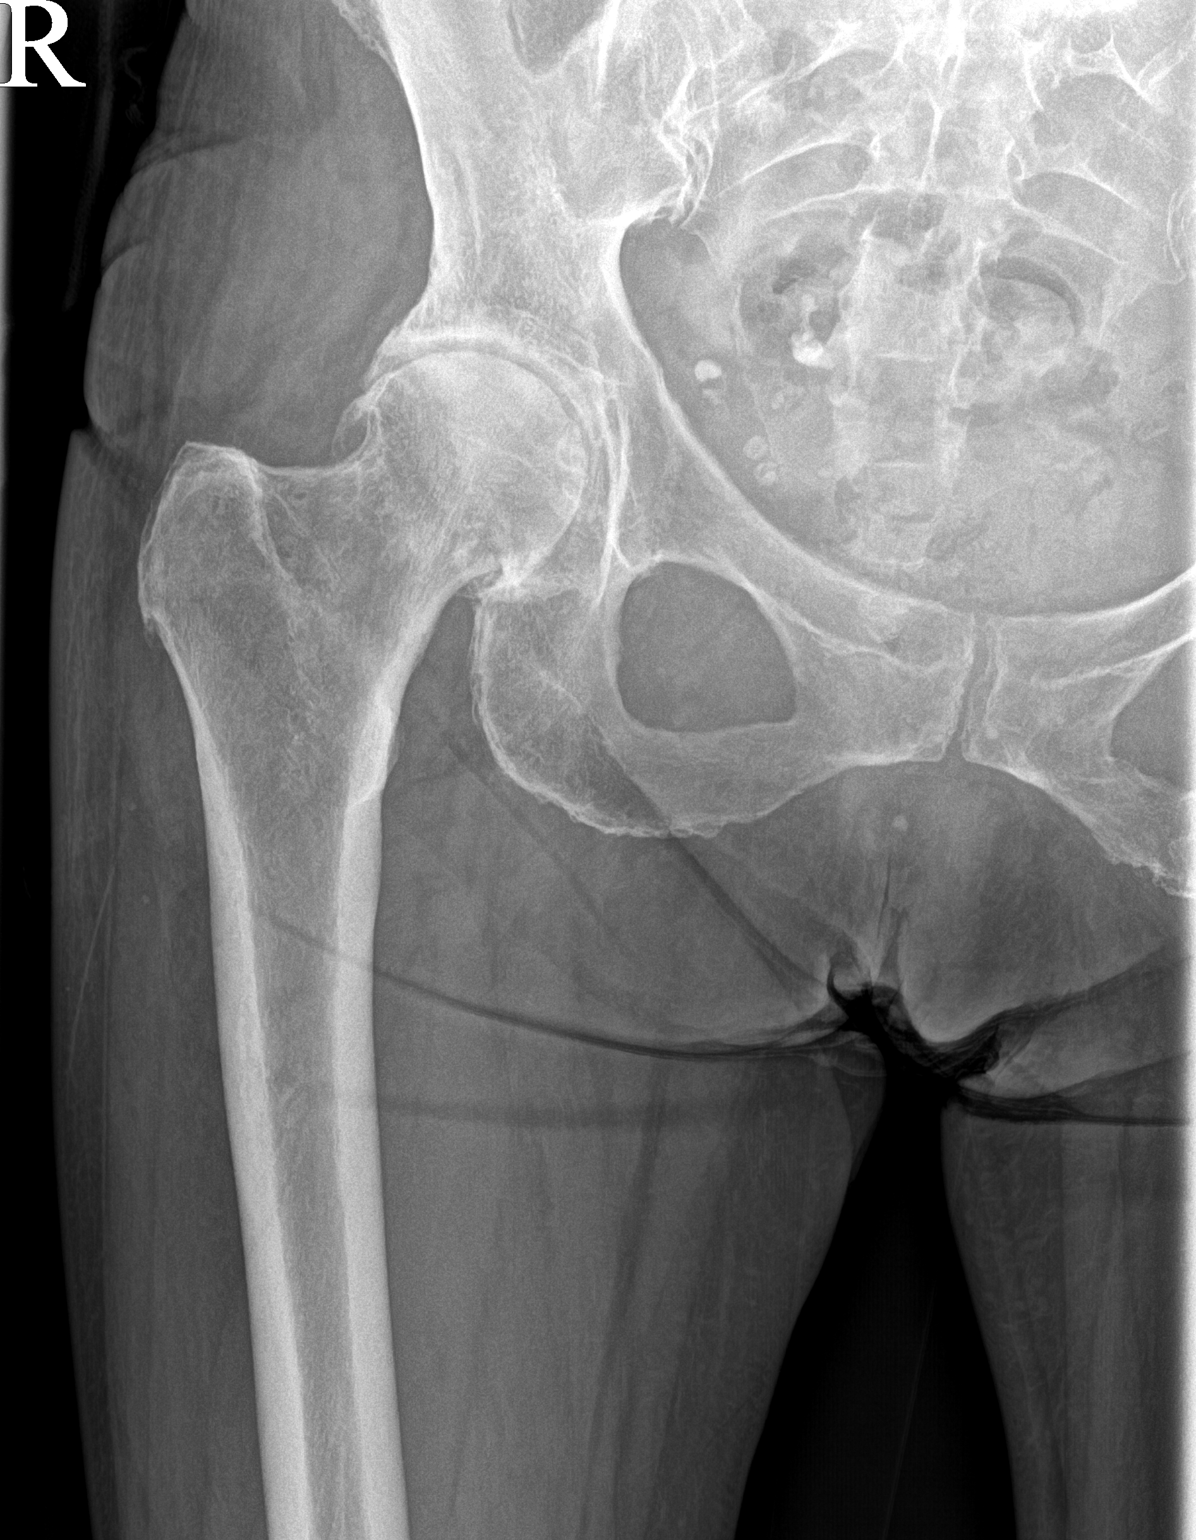

[hip frog leg]
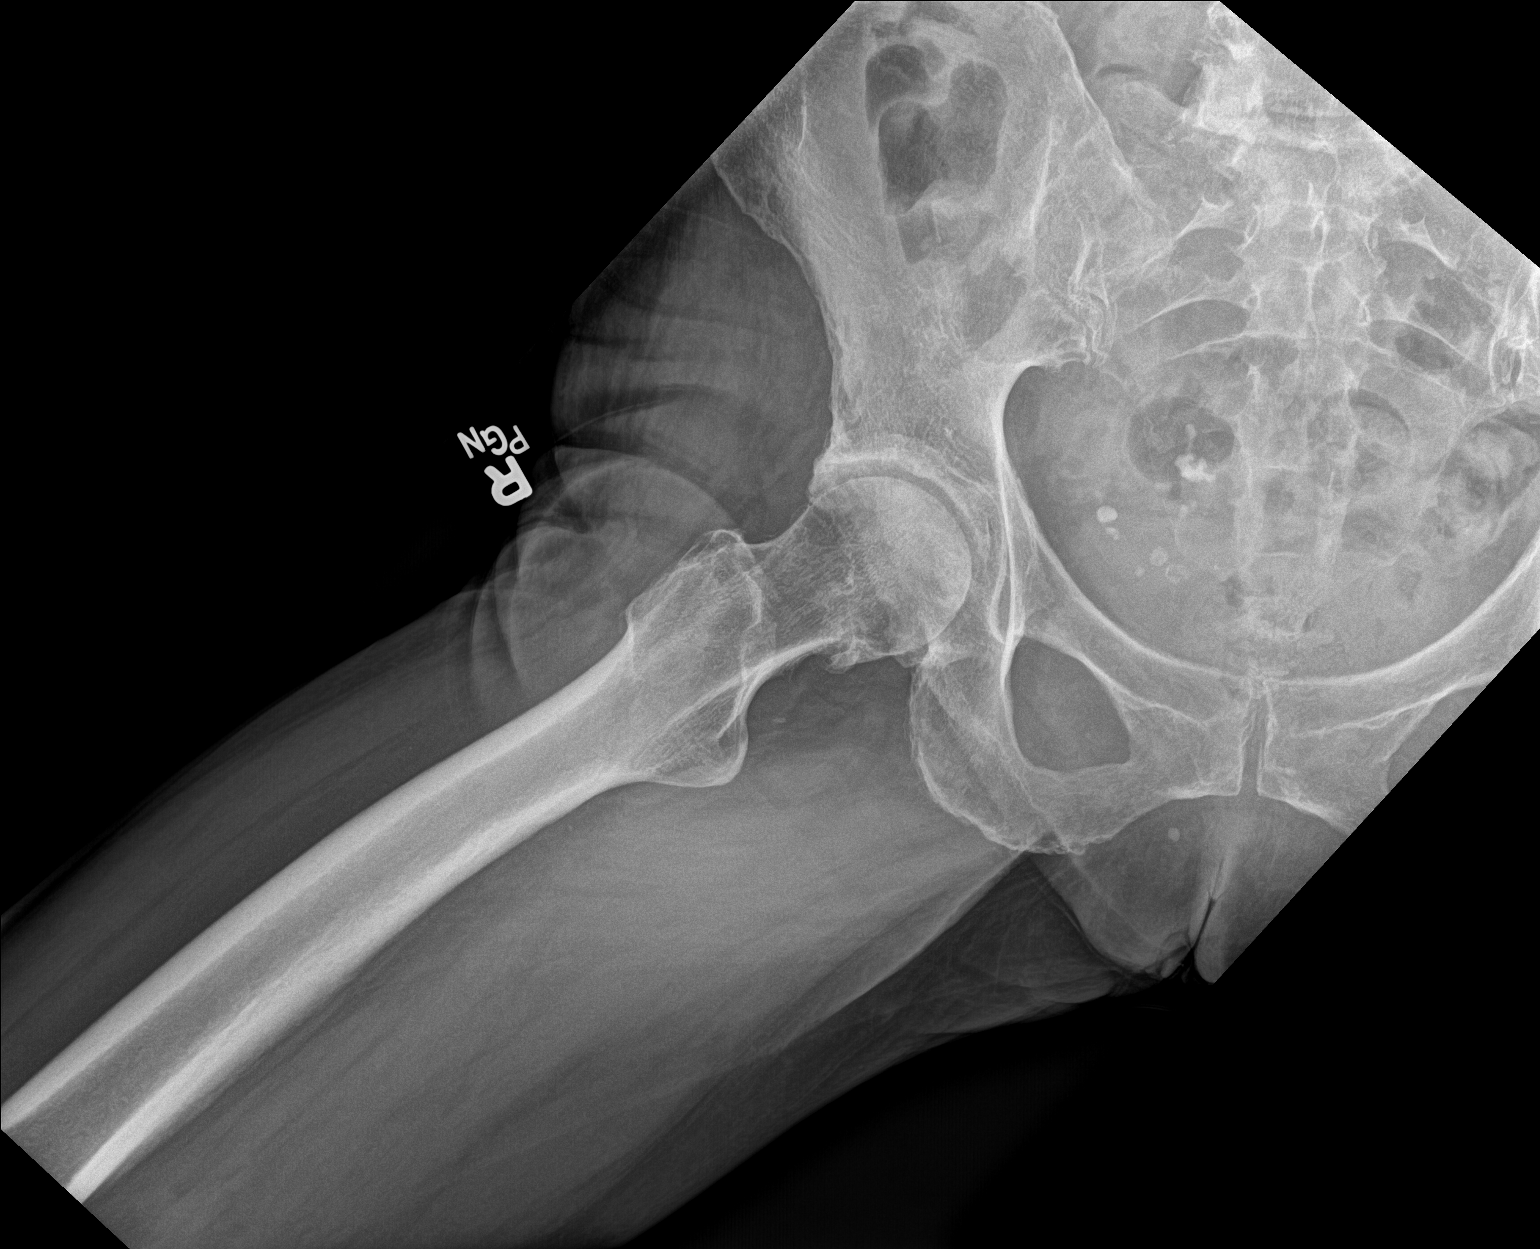

[3 of 3 positions shown; findings below may reference images not displayed]

FINDINGS: Frontal pelvis as well as frontal and lateral right hip images were
obtained. No fracture or dislocation. There is moderately severe
narrowing of each hip joint. There is bony overgrowth along the
superolateral acetabula, slightly more on the right than on the
left. No erosive change. There is degenerative change in the lower
lumbar spine.
IMPRESSION: Osteoarthritic change in each hip joint with moderately severe
narrowing in each hip joint. There is spurring in each hip joint.
Bony overgrowth along the superolateral acetabulum potentially
places patient at increased risk for femoroacetabular impingement.
No fracture or dislocation evident.

## 2021-03-30 DIAGNOSIS — H18231 Secondary corneal edema, right eye: Secondary | ICD-10-CM | POA: Diagnosis not present

## 2021-03-30 DIAGNOSIS — H2011 Chronic iridocyclitis, right eye: Secondary | ICD-10-CM | POA: Diagnosis not present

## 2021-03-30 DIAGNOSIS — Z961 Presence of intraocular lens: Secondary | ICD-10-CM | POA: Diagnosis not present

## 2021-03-30 DIAGNOSIS — H35351 Cystoid macular degeneration, right eye: Secondary | ICD-10-CM | POA: Diagnosis not present

## 2021-04-03 ENCOUNTER — Encounter: Payer: Self-pay | Admitting: Internal Medicine

## 2021-04-06 DIAGNOSIS — H20011 Primary iridocyclitis, right eye: Secondary | ICD-10-CM | POA: Diagnosis not present

## 2021-04-06 DIAGNOSIS — H35411 Lattice degeneration of retina, right eye: Secondary | ICD-10-CM | POA: Diagnosis not present

## 2021-04-06 DIAGNOSIS — H1811 Bullous keratopathy, right eye: Secondary | ICD-10-CM | POA: Diagnosis not present

## 2021-04-06 DIAGNOSIS — H401124 Primary open-angle glaucoma, left eye, indeterminate stage: Secondary | ICD-10-CM | POA: Diagnosis not present

## 2021-04-09 DIAGNOSIS — H20011 Primary iridocyclitis, right eye: Secondary | ICD-10-CM | POA: Diagnosis not present

## 2021-04-27 ENCOUNTER — Other Ambulatory Visit: Payer: Self-pay | Admitting: Internal Medicine

## 2021-05-07 DIAGNOSIS — B0232 Zoster iridocyclitis: Secondary | ICD-10-CM | POA: Diagnosis not present

## 2021-05-09 ENCOUNTER — Telehealth: Payer: Self-pay | Admitting: Pharmacist

## 2021-05-11 DIAGNOSIS — H18231 Secondary corneal edema, right eye: Secondary | ICD-10-CM | POA: Diagnosis not present

## 2021-05-21 NOTE — Progress Notes (Signed)
     Chronic Care Management Pharmacy Assistant   Name: Jo Morrison  MRN: [id number] DOB: 1946/11/06   Medications: Outpatient Encounter Medications as of 05/09/2021  Medication Sig Note   Albuterol Sulfate (PROAIR RESPICLICK) 672 (90 BASE) MCG/ACT AEPB Inhale 2 puffs into the lungs every 4 (four) hours as needed (SOB).    Bromfenac Sodium (PROLENSA) 0.07 % SOLN Place 1 drop into the right eye 4 (four) times daily. (Patient taking differently: Place 1 drop into the right eye daily in the afternoon.) 02/21/2020: Three times/day   clonazePAM (KLONOPIN) 0.5 MG tablet Take 1 tablet (0.5 mg total) by mouth at bedtime as needed.    dorzolamide-timolol (COSOPT) 22.3-6.8 MG/ML ophthalmic solution Place 1 drop into the right eye 2 (two) times daily.     fluticasone (FLONASE) 50 MCG/ACT nasal spray Place 2 sprays into the nose daily as needed for allergies.     gabapentin (NEURONTIN) 100 MG capsule Take 100 mg by mouth at bedtime.  (Patient not taking: No sig reported)    guaifenesin (ROBITUSSIN) 100 MG/5ML syrup Take 200 mg by mouth as needed for cough.    hydrochlorothiazide (HYDRODIURIL) 25 MG tablet TAKE 1 TABLET(25 MG) BY MOUTH DAILY    loteprednol (LOTEMAX) 0.5 % ophthalmic suspension Place 1 drop into the right eye 4 (four) times daily.    methocarbamol (ROBAXIN) 500 MG tablet Take 1 tablet (500 mg total) by mouth every 8 (eight) hours as needed for muscle spasms.    montelukast (SINGULAIR) 10 MG tablet Take 10 mg by mouth at bedtime.    pantoprazole (PROTONIX) 40 MG tablet Take 40 mg by mouth daily.    REFRESH OPTIVE ADVANCED 0.5-1-0.5 % SOLN Place 1 drop into both eyes daily as needed (itching Eyes).     sodium chloride (MURO 128) 2 % ophthalmic solution Place 1 drop into the right eye 4 (four) times daily.    sodium chloride (MURO 128) 5 % ophthalmic ointment Place 1 application into the right eye at bedtime.     traZODone (DESYREL) 100 MG tablet TAKE 1 TO 2 TABLETS(100 TO 200 MG) BY MOUTH AT  BEDTIME AS NEEDED FOR SLEEP    venlafaxine XR (EFFEXOR-XR) 150 MG 24 hr capsule Take 1 capsule (150 mg total) by mouth daily.    No facility-administered encounter medications on file as of 05/09/2021.   Pharmacist Review  Made 3 attempts to reach patient to review medicationa and assess health state. Left voicemail for patient to return call. Unable to reach ptient.   Ethelene Hal Clinical Pharmacist Assistant [phone]   Time spent:13

## 2021-06-04 ENCOUNTER — Other Ambulatory Visit: Payer: Self-pay

## 2021-06-04 ENCOUNTER — Ambulatory Visit (INDEPENDENT_AMBULATORY_CARE_PROVIDER_SITE_OTHER): Payer: PPO | Admitting: Pharmacist

## 2021-06-04 DIAGNOSIS — J4520 Mild intermittent asthma, uncomplicated: Secondary | ICD-10-CM

## 2021-06-04 DIAGNOSIS — I10 Essential (primary) hypertension: Secondary | ICD-10-CM | POA: Diagnosis not present

## 2021-06-04 DIAGNOSIS — F419 Anxiety disorder, unspecified: Secondary | ICD-10-CM

## 2021-06-04 DIAGNOSIS — K219 Gastro-esophageal reflux disease without esophagitis: Secondary | ICD-10-CM

## 2021-06-04 NOTE — Progress Notes (Signed)
, Chronic Care Management Pharmacy Note  06/05/2021 Name:  Jo Morrison MRN:  [id number] DOB:  [birth year]  Summary: -Pt reports she is no longer taking pantoprazole, montelukast, gabapentin or methocarbamol -Pt is now taking Breo Ellipta daily and reports rare use of albuterol since starting this -Pt reports her eye issues stemmed from shingles in her eye; she is taking valacyclovir daily and Vigamox eye drops until her eye surgery on 06/13/21 -Pt is overdue for PCP follow up (last seen 08/2019)  Recommendations/Changes made from today's visit: -Updated medication list as above -Advised pt to make PCP appt at earliest convenience   Subjective: Jo Morrison is an 75 y.o. year old female who is a primary patient of Hoyt Koch, MD.  The CCM team was consulted for assistance with disease management and care coordination needs.    Engaged with patient by telephone for follow up visit in response to provider referral for pharmacy case management and/or care coordination services.   Consent to Services:  The patient was given information about Chronic Care Management services, agreed to services, and gave verbal consent prior to initiation of services.  Please see initial visit note for detailed documentation.   Patient Care Team: Hoyt Koch, MD as PCP - General (Internal Medicine) Nicholas Lose, MD as Consulting Physician (Hematology and Oncology) Gatha Mayer, MD as Consulting Physician (Gastroenterology) Jonnie Finner, RN (Inactive) as Oncology Nurse Navigator Charlton Haws, [facility] as Pharmacist (Pharmacist) Ladell Pier, MD as Consulting Physician (Oncology)  Recent office visits: 09/17/19: Patient presented to Dr. Sharlet Salina for follow-up.  Recent consult visits: 03/06/21 Dr Tammi Klippel ([place]) f/u cancer 01/31/21 NP Ned Card (heme/onc): f/u esophageal cancer 01/24/21 PA Hurst (psych): f/u depression, anxiety. Refilled venlafaxine  Hospital  visits: None in previous 6 months   Objective:  Lab Results  Component Value Date   CREATININE 0.71 02/21/2020   BUN 10 02/21/2020   GFR 76.75 11/29/2019   GFRNONAA >60 02/21/2020   GFRAA >60 02/21/2020   NA 141 02/21/2020   K 3.5 02/21/2020   CALCIUM 8.9 02/21/2020   CO2 28 02/21/2020   GLUCOSE 146 (H) 02/21/2020    Lab Results  Component Value Date/Time   GFR 76.75 11/29/2019 03:14 PM   GFR 73.35 09/24/2019 11:06 AM    Last diabetic Eye exam: No results found for: HMDIABEYEEXA  Last diabetic Foot exam: No results found for: HMDIABFOOTEX   Lab Results  Component Value Date   CHOL 179 09/24/2019   HDL 61.90 09/24/2019   LDLCALC 96 09/24/2019   TRIG 102.0 09/24/2019   CHOLHDL 3 09/24/2019    Hepatic Function Latest Ref Rng & Units 02/21/2020 02/14/2020 02/07/2020  Total Protein 6.5 - 8.1 g/dL 6.4(L) 6.3(L) 6.4(L)  Albumin 3.5 - 5.0 g/dL 3.4(L) 3.3(L) 3.3(L)  AST 15 - 41 U/L 16 13(L) 14(L)  ALT 0 - 44 U/L $Remo'12 12 11  'AyUdF$ Alk Phosphatase 38 - 126 U/L 44 48 58  Total Bilirubin 0.3 - 1.2 mg/dL 0.8 0.8 0.6    Lab Results  Component Value Date/Time   TSH 2.920 06/09/2014 10:30 AM    CBC Latest Ref Rng & Units 03/13/2020 02/28/2020 02/21/2020  WBC 4.0 - 10.5 K/uL 3.1(L) 3.1(L) 2.6(L)  Hemoglobin 12.0 - 15.0 g/dL 13.2 12.0 12.1  Hematocrit 36.0 - 46.0 % 38.9 36.0 35.6(L)  Platelets 150 - 400 K/uL 193 186 244    Lab Results  Component Value Date/Time   VD25OH 52 03/01/2013 01:59 PM  Clinical ASCVD: No  The 10-year ASCVD risk score Denman George DC Jr., et al., 2013) is: 18.6%   Values used to calculate the score:     Age: 75 years     Sex: Female     Is Non-Hispanic African American: No     Diabetic: No     Tobacco smoker: No     Systolic Blood Pressure: 120 mmHg     Is BP treated: Yes     HDL Cholesterol: 61.9 mg/dL     Total Cholesterol: 179 mg/dL    Depression screen [facility] 2/9 03/12/2021 09/24/2019 10/08/2017  Decreased Interest 0 0 0  Down, Depressed, Hopeless 0 0 0   PHQ - 2 Score 0 0 0  Altered sleeping - 0 0  Tired, decreased energy - 0 0  Change in appetite - 0 0  Feeling bad or failure about yourself  - 0 0  Trouble concentrating - 0 0  Moving slowly or fidgety/restless - 0 0  Suicidal thoughts - 0 0  PHQ-9 Score - 0 0  Difficult doing work/chores - - Not difficult at all  Some recent data might be hidden     Social History   Tobacco Use  Smoking Status Never  Smokeless Tobacco Never   BP Readings from Last 3 Encounters:  03/12/21 120/60  01/31/21 126/75  11/22/20 110/76   Pulse Readings from Last 3 Encounters:  03/12/21 99  01/31/21 100  11/22/20 83   Wt Readings from Last 3 Encounters:  03/12/21 104 lb 9.6 oz (47.4 kg)  01/31/21 105 lb 1.6 oz (47.7 kg)  11/22/20 108 lb (49 kg)   BMI Readings from Last 3 Encounters:  03/12/21 18.53 kg/m  01/31/21 18.62 kg/m  11/22/20 19.13 kg/m    Assessment/Interventions: Review of patient past medical history, allergies, medications, health status, including review of consultants reports, laboratory and other test data, was performed as part of comprehensive evaluation and provision of chronic care management services.   SDOH:  (Social Determinants of Health) assessments and interventions performed: Yes  SDOH Screenings   Alcohol Screen: Low Risk    Last Alcohol Screening Score (AUDIT): 4  Depression (PHQ2-9): Low Risk    PHQ-2 Score: 0  Financial Resource Strain: Low Risk    Difficulty of Paying Living Expenses: Not hard at all  Food Insecurity: No Food Insecurity   Worried About Programme researcher, broadcasting/film/video in the Last Year: Never true   Ran Out of Food in the Last Year: Never true  Housing: Low Risk    Last Housing Risk Score: 0  Physical Activity: Sufficiently Active   Days of Exercise per Week: 5 days   Minutes of Exercise per Session: 30 min  Social Connections: Press photographer of Communication with Friends and Family: More than three times a week   Frequency  of Social Gatherings with Friends and Family: Once a week   Attends Religious Services: More than 4 times per year   Active Member of [employer] or Organizations: Yes   Attends Engineer, structural: More than 4 times per year   Marital Status: Married  Stress: No Stress Concern Present   Feeling of Stress : Not at all  Tobacco Use: Low Risk    Smoking Tobacco Use: Never   Smokeless Tobacco Use: Never  Transportation Needs: No Transportation Needs   Lack of Transportation (Medical): No   Lack of Transportation (Non-Medical): No    CCM Care Plan  Allergies  Allergen Reactions   Latex Shortness Of Breath    Medications Reviewed Today     Reviewed by Charlton Haws, [facility] (Pharmacist) on 06/05/21 at [place] List Status: <None>   Medication Order Taking? Sig Documenting Provider Last Dose Status Informant  Albuterol Sulfate (PROAIR RESPICLICK) 628 (90 BASE) MCG/ACT AEPB [id number] Yes Inhale 2 puffs into the lungs every 4 (four) hours as needed (SOB). Hoyt Koch, MD Taking Active Self  Bromfenac Sodium (PROLENSA) 0.07 % SOLN [id number] Yes Place 1 drop into the right eye 4 (four) times daily.  Patient taking differently: Place 1 drop into the right eye daily in the afternoon.   Bernarda Caffey, MD Taking Active            Med Note Tania Ade   Mon Feb 21, 2020 10:09 AM) Three times/day  clonazePAM (KLONOPIN) 0.5 MG tablet [id number] Yes Take 1 tablet (0.5 mg total) by mouth at bedtime as needed. Donnal Moat T, PA-C Taking Active   dorzolamide-timolol (COSOPT) 22.3-6.8 MG/ML ophthalmic solution [id number] Yes Place 1 drop into the right eye 2 (two) times daily.  [provider] Taking Active Self  fluticasone (FLONASE) 50 MCG/ACT nasal spray [id number] Yes Place 2 sprays into the nose daily as needed for allergies.  [provider] Taking Active Self  fluticasone furoate-vilanterol (BREO ELLIPTA) 200-25 MCG/INH AEPB [id number] Yes Inhale 1 puff  into the lungs daily. [provider] Taking Active   guaifenesin (ROBITUSSIN) 100 MG/5ML syrup [id number] Yes Take 200 mg by mouth as needed for cough. [provider] Taking Active   hydrochlorothiazide (HYDRODIURIL) 25 MG tablet [id number] Yes TAKE 1 TABLET(25 MG) BY MOUTH DAILY Hoyt Koch, MD Taking Active   loteprednol (LOTEMAX) 0.5 % ophthalmic suspension [id number] Yes Place 1 drop into the right eye 4 (four) times daily. [provider] Taking Active Self  moxifloxacin (VIGAMOX) 0.5 % ophthalmic solution [id number] Yes Apply to eye. [provider] Taking Active   REFRESH OPTIVE ADVANCED 0.5-1-0.5 % SOLN [id number] Yes Place 1 drop into both eyes daily as needed (itching Eyes).  [provider] Taking Active Self  traZODone (DESYREL) 100 MG tablet [id number] Yes TAKE 1 TO 2 TABLETS(100 TO 200 MG) BY MOUTH AT BEDTIME AS NEEDED FOR SLEEP Hurst, Teresa T, PA-C Taking Active   valACYclovir (VALTREX) 500 MG tablet [id number] Yes Take 500 mg by mouth daily. [provider] Taking Active   venlafaxine XR (EFFEXOR-XR) 150 MG 24 hr capsule [id number] Yes Take 1 capsule (150 mg total) by mouth daily. Addison Lank, PA-C Taking Active             Patient Active Problem List   Diagnosis Date Noted   Protein-calorie malnutrition ([place]) 11/22/2020   Genetic testing 02/29/2020   Family history of colon cancer    Colon polyps    Primary squamous cell carcinoma of overlapping sites of esophagus ([place]) 01/19/2020   Chronic diarrhea 08/07/2019   Low back pain 03/01/2019   Routine general medical examination at a health care facility 08/16/2016   Carpal tunnel syndrome, right 02/08/2016   Ductal carcinoma in situ (DCIS) of left breast 10/28/2014   Contracture of palmar fascia (Dupuytren's) 07/13/2014   Hypertension    Anxiety    Asthma    Osteoarthritis    Glaucoma    Hx of adenomatous colonic polyps 11/01/2012   Breast cancer of  lower-outer quadrant of right female breast ([place]) 10/12/2012    Immunization History  Administered  Date(s) Administered   Influenza Split 08/20/2020   Influenza, High Dose Seasonal PF 08/09/2016, 09/09/2017, 09/07/2018, 08/01/2019   Influenza,inj,Quad PF,6+ Mos 08/10/2015   Influenza-Unspecified 08/01/2019, 02/18/2020, 08/20/2020   PFIZER(Purple Top)SARS-COV-2 Vaccination 12/05/2019, 12/26/2019, 08/03/2020   Pneumococcal Conjugate-13 12/13/2014   Pneumococcal Polysaccharide-23 06/16/2013   Tdap 11/25/1998, 08/10/2015   Zoster Recombinat (Shingrix) 06/22/2017, 09/10/2017   Zoster, Live 05/27/2012    Conditions to be addressed/monitored:  Hypertension, Asthma, Depression, and Anxiety  Care Plan : [place]  Updates made by Charlton Haws, [place] since 06/05/2021 12:00 AM     Problem: Hypertension, Asthma, Depression, and Anxiety   Priority: High     Long-Range Goal: Disease management   Start Date: 06/04/2021  Expected End Date: 06/05/2022  This Visit's Progress: On track  Priority: High  Note:   Current Barriers:  Unable to independently monitor therapeutic efficacy  Pharmacist Clinical Goal(s):  Patient will achieve adherence to monitoring guidelines and medication adherence to achieve therapeutic efficacy through collaboration with PharmD and provider.   Interventions: 1:1 collaboration with Hoyt Koch, MD regarding development and update of comprehensive plan of care as evidenced by provider attestation and co-signature Inter-disciplinary care team collaboration (see longitudinal plan of care) Comprehensive medication review performed; medication list updated in electronic medical record  Asthma    Patient has tried/failed these meds in past: budesonide, montelukast Patient is currently controlled on the following medications: ProAir Respiclick 032 mcg/act 2 puff q4hr PRN Breo Ellipta 200-25 mcg/act 1 puff daily   Using maintenance inhaler  regularly? Yes Frequency of rescue inhaler use:  infrequently   We discussed: Pt reports rescue inhaler use has declined since starting Breo; she denies cost issues   Plan: Recommend to continue current medication   Hypertension    BP goal is:  <140/90 Patient checks BP at home daily Patient home BP readings are ranging: 120s/80s   Patient has failed these meds in the past: n/a Patient is currently controlled on the following medications: HCTZ 25 mg daily   We discussed: BP goals; pt previously had issues with low K but made an effort to increase potassium-rich foods; most recent K+ at [facility] was 3.5 (11/27/20) per Care Everywhere.   Plan: Continue current medications    Hx PE    Incidental finding on CT, 04/03/20 Pt completed 6 months of treatment with Lovenox then Eliquis. Pt denies s/sx of recurrent VTE   Plan: Continue to monitor   Depression / Anxiety    Patient has failed these meds in past: n/a Patient is currently controlled on the following medications: Clonazepam 0.5 mg QHS PRN (very rare use) Venlafaxine XR 150 mg daily Trazodone 100-200 mg QHS PRN sleep   We discussed:  Pt reports sx are well controlled; she has not used clonazepam in months   Plan: Continue current medications   GERD / Esophageal SCC    Esophageal cancer is in remission. Pt is no longer requiring PPI.   Plan: Continue to monitor for symptoms   Macular Edema Right Eye    Eye surgery 06/13/21  Patient has failed these meds in past: valacyclovir Patient is currently controlled on the following medications: Prolensa 0.07% 1 drop right eye QID Cosopt 22.3-6.8 mg/mL 1 drop right eye BID Lotemax 0.5% ophth 1 drop right eye QID Refresh eye drop PRN Vigamox eye drops Valacylovir 500 mg daily   We discussed:  Pt reports her eye issues stemmed from shingles in the eye; she is treated with oral  Valtrex and Vigamox eye drops; she is hopeful she will be able to stop many of the eye drops after  surgery   Plan: Continue current medications as directed by ophthalmologist    Patient Goals/Self-Care Activities Patient will:  - take medications as prescribed focus on medication adherence by routine check blood pressure weekly, document, and provide at future appointments -make appt with PCP at earliest convenience (last seen 08/2019)      Medication Assistance: None required.  Patient affirms current coverage meets needs.  Compliance/Adherence/Medication fill history: Care Gaps: Covid booster (due 11/02/20)  Star-Rating Drugs: None  Patient's preferred pharmacy is:  [id number] [address] AT [facility] [address] [place] [address] Phone: [phone] Fax: [phone]  Uses pill box? No - prefers bottles Pt endorses 100% compliance  We discussed: Current pharmacy is preferred with insurance plan and patient is satisfied with pharmacy services Patient decided to: Continue current medication management strategy  Care Plan and Follow Up Patient Decision:  Patient agrees to Care Plan and [name].  Plan: Telephone follow up appointment with care management team member scheduled for:  1 year  Charlene Brooke, PharmD, [place], CPP Clinical Pharmacist [facility] at [facility] [phone]

## 2021-06-05 NOTE — Patient Instructions (Signed)
Visit Information  Phone number for Pharmacist: [phone]   Goals Addressed             This Visit's Progress    Manage My Medicine       Timeframe:  Long-Range Goal Priority:  Medium Start Date:      06/04/21                       Expected End Date:  06/04/22                     Follow Up Date Oct 2022   - call for medicine refill 2 or 3 days before it runs out - call if I am sick and can't take my medicine - keep a list of all the medicines I take; vitamins and herbals too    Why is this important?   These steps will help you keep on track with your medicines.   Notes:          Patient verbalizes understanding of instructions provided today and agrees to view in [place].  Telephone follow up appointment with pharmacy team member scheduled for: 1 year  Charlene Brooke, PharmD, [place], CPP Clinical Pharmacist [facility] at [facility] [phone]

## 2021-06-13 DIAGNOSIS — Z79899 Other long term (current) drug therapy: Secondary | ICD-10-CM | POA: Diagnosis not present

## 2021-06-13 DIAGNOSIS — J45909 Unspecified asthma, uncomplicated: Secondary | ICD-10-CM | POA: Diagnosis not present

## 2021-06-13 DIAGNOSIS — I10 Essential (primary) hypertension: Secondary | ICD-10-CM | POA: Diagnosis not present

## 2021-06-13 DIAGNOSIS — Z91040 Latex allergy status: Secondary | ICD-10-CM | POA: Diagnosis not present

## 2021-06-13 DIAGNOSIS — Z86711 Personal history of pulmonary embolism: Secondary | ICD-10-CM | POA: Diagnosis not present

## 2021-06-13 DIAGNOSIS — Z923 Personal history of irradiation: Secondary | ICD-10-CM | POA: Diagnosis not present

## 2021-06-13 DIAGNOSIS — H1811 Bullous keratopathy, right eye: Secondary | ICD-10-CM | POA: Diagnosis not present

## 2021-06-13 DIAGNOSIS — H59011 Keratopathy (bullous aphakic) following cataract surgery, right eye: Secondary | ICD-10-CM | POA: Diagnosis not present

## 2021-06-13 DIAGNOSIS — Z9221 Personal history of antineoplastic chemotherapy: Secondary | ICD-10-CM | POA: Diagnosis not present

## 2021-06-13 DIAGNOSIS — Z8501 Personal history of malignant neoplasm of esophagus: Secondary | ICD-10-CM | POA: Diagnosis not present

## 2021-06-13 DIAGNOSIS — Z853 Personal history of malignant neoplasm of breast: Secondary | ICD-10-CM | POA: Diagnosis not present

## 2021-07-11 ENCOUNTER — Encounter: Payer: Self-pay | Admitting: Oncology

## 2021-07-26 ENCOUNTER — Other Ambulatory Visit: Payer: Self-pay | Admitting: Internal Medicine

## 2021-07-27 DIAGNOSIS — B0232 Zoster iridocyclitis: Secondary | ICD-10-CM | POA: Diagnosis not present

## 2021-08-03 ENCOUNTER — Ambulatory Visit: Payer: PPO | Admitting: Oncology

## 2021-08-06 DIAGNOSIS — Z8501 Personal history of malignant neoplasm of esophagus: Secondary | ICD-10-CM | POA: Diagnosis not present

## 2021-08-06 DIAGNOSIS — C158 Malignant neoplasm of overlapping sites of esophagus: Secondary | ICD-10-CM | POA: Diagnosis not present

## 2021-08-06 DIAGNOSIS — Z923 Personal history of irradiation: Secondary | ICD-10-CM | POA: Diagnosis not present

## 2021-08-06 DIAGNOSIS — Z961 Presence of intraocular lens: Secondary | ICD-10-CM | POA: Diagnosis not present

## 2021-08-06 DIAGNOSIS — Z947 Corneal transplant status: Secondary | ICD-10-CM | POA: Diagnosis not present

## 2021-08-06 DIAGNOSIS — Z23 Encounter for immunization: Secondary | ICD-10-CM | POA: Diagnosis not present

## 2021-08-06 DIAGNOSIS — Z08 Encounter for follow-up examination after completed treatment for malignant neoplasm: Secondary | ICD-10-CM | POA: Diagnosis not present

## 2021-08-06 DIAGNOSIS — Z853 Personal history of malignant neoplasm of breast: Secondary | ICD-10-CM | POA: Diagnosis not present

## 2021-08-06 DIAGNOSIS — C50511 Malignant neoplasm of lower-outer quadrant of right female breast: Secondary | ICD-10-CM | POA: Diagnosis not present

## 2021-08-06 DIAGNOSIS — H409 Unspecified glaucoma: Secondary | ICD-10-CM | POA: Diagnosis not present

## 2021-08-06 DIAGNOSIS — Z7901 Long term (current) use of anticoagulants: Secondary | ICD-10-CM | POA: Diagnosis not present

## 2021-08-06 DIAGNOSIS — I10 Essential (primary) hypertension: Secondary | ICD-10-CM | POA: Diagnosis not present

## 2021-08-06 DIAGNOSIS — Z79899 Other long term (current) drug therapy: Secondary | ICD-10-CM | POA: Diagnosis not present

## 2021-08-06 DIAGNOSIS — C159 Malignant neoplasm of esophagus, unspecified: Secondary | ICD-10-CM | POA: Diagnosis not present

## 2021-08-07 DIAGNOSIS — Z961 Presence of intraocular lens: Secondary | ICD-10-CM | POA: Diagnosis not present

## 2021-08-07 DIAGNOSIS — Z8501 Personal history of malignant neoplasm of esophagus: Secondary | ICD-10-CM | POA: Diagnosis not present

## 2021-08-07 DIAGNOSIS — K2289 Other specified disease of esophagus: Secondary | ICD-10-CM | POA: Diagnosis not present

## 2021-08-07 DIAGNOSIS — J449 Chronic obstructive pulmonary disease, unspecified: Secondary | ICD-10-CM | POA: Diagnosis not present

## 2021-08-07 DIAGNOSIS — Z7901 Long term (current) use of anticoagulants: Secondary | ICD-10-CM | POA: Diagnosis not present

## 2021-08-07 DIAGNOSIS — Z79899 Other long term (current) drug therapy: Secondary | ICD-10-CM | POA: Diagnosis not present

## 2021-08-07 DIAGNOSIS — Z86711 Personal history of pulmonary embolism: Secondary | ICD-10-CM | POA: Diagnosis not present

## 2021-08-07 DIAGNOSIS — I10 Essential (primary) hypertension: Secondary | ICD-10-CM | POA: Diagnosis not present

## 2021-08-07 DIAGNOSIS — Z947 Corneal transplant status: Secondary | ICD-10-CM | POA: Diagnosis not present

## 2021-08-07 DIAGNOSIS — F419 Anxiety disorder, unspecified: Secondary | ICD-10-CM | POA: Diagnosis not present

## 2021-08-07 DIAGNOSIS — Z86010 Personal history of colonic polyps: Secondary | ICD-10-CM | POA: Diagnosis not present

## 2021-08-07 DIAGNOSIS — Z7951 Long term (current) use of inhaled steroids: Secondary | ICD-10-CM | POA: Diagnosis not present

## 2021-08-07 DIAGNOSIS — Z9221 Personal history of antineoplastic chemotherapy: Secondary | ICD-10-CM | POA: Diagnosis not present

## 2021-08-07 DIAGNOSIS — Z86000 Personal history of in-situ neoplasm of breast: Secondary | ICD-10-CM | POA: Diagnosis not present

## 2021-08-07 DIAGNOSIS — Z923 Personal history of irradiation: Secondary | ICD-10-CM | POA: Diagnosis not present

## 2021-08-07 DIAGNOSIS — Z08 Encounter for follow-up examination after completed treatment for malignant neoplasm: Secondary | ICD-10-CM | POA: Diagnosis not present

## 2021-08-24 ENCOUNTER — Inpatient Hospital Stay: Payer: PPO | Admitting: Oncology

## 2021-08-30 ENCOUNTER — Telehealth: Payer: Self-pay | Admitting: Pharmacist

## 2021-08-30 ENCOUNTER — Other Ambulatory Visit: Payer: Self-pay

## 2021-08-30 ENCOUNTER — Inpatient Hospital Stay: Payer: PPO | Attending: Oncology | Admitting: Oncology

## 2021-08-30 VITALS — BP 133/82 | HR 99 | Temp 98.7°F | Resp 18 | Ht 63.0 in | Wt 109.6 lb

## 2021-08-30 DIAGNOSIS — C158 Malignant neoplasm of overlapping sites of esophagus: Secondary | ICD-10-CM | POA: Diagnosis not present

## 2021-08-30 DIAGNOSIS — Z86711 Personal history of pulmonary embolism: Secondary | ICD-10-CM | POA: Insufficient documentation

## 2021-08-30 DIAGNOSIS — Z853 Personal history of malignant neoplasm of breast: Secondary | ICD-10-CM | POA: Insufficient documentation

## 2021-08-30 DIAGNOSIS — I10 Essential (primary) hypertension: Secondary | ICD-10-CM | POA: Insufficient documentation

## 2021-08-30 DIAGNOSIS — F32A Depression, unspecified: Secondary | ICD-10-CM | POA: Diagnosis not present

## 2021-08-30 DIAGNOSIS — J45909 Unspecified asthma, uncomplicated: Secondary | ICD-10-CM | POA: Diagnosis not present

## 2021-08-30 DIAGNOSIS — Z9221 Personal history of antineoplastic chemotherapy: Secondary | ICD-10-CM | POA: Diagnosis not present

## 2021-08-30 DIAGNOSIS — Z8501 Personal history of malignant neoplasm of esophagus: Secondary | ICD-10-CM | POA: Diagnosis not present

## 2021-08-30 DIAGNOSIS — Z923 Personal history of irradiation: Secondary | ICD-10-CM | POA: Diagnosis not present

## 2021-08-30 NOTE — Progress Notes (Signed)
[place] OFFICE PROGRESS NOTE   Diagnosis: Esophagus cancer  INTERVAL HISTORY:   Jo Morrison returns as scheduled.  She feels well.  No dysphagia.  She is exercising with a trainer. She underwent a repeat upper endoscopy by Dr. Williemae Natter on 08/07/2021.  A mild stricture was noted in the distal esophagus.  No mass.  Multiple biopsies were obtained from the mid esophagus.  No carcinoma. Restaging CTs on 08/06/2021 were negative for recurrent disease.  Objective:  Vital signs in last 24 hours:  Blood pressure 133/82, pulse 99, temperature 98.7 F (37.1 C), temperature source Oral, resp. rate 18, height _0  (1.6 m), weight 109 lb 9.6 oz (49.7 kg), SpO2 100 %.    Lymphatics: No cervical, supraclavicular, axillary, or inguinal nodes Resp: Lungs clear bilaterally Cardio: Regular rate and rhythm  GI: No hepatosplenomegaly, no mass, nontender Vascular: No leg edema     Portacath/PICC-without erythema  Lab Results:  Lab Results  Component Value Date   WBC 3.1 (L) 03/13/2020   HGB 13.2 03/13/2020   HCT 38.9 03/13/2020   MCV 95.1 03/13/2020   PLT 193 03/13/2020   NEUTROABS 1.9 03/13/2020    CMP  Lab Results  Component Value Date   NA 141 02/21/2020   K 3.5 02/21/2020   CL 106 02/21/2020   CO2 28 02/21/2020   GLUCOSE 146 (H) 02/21/2020   BUN 10 02/21/2020   CREATININE 0.71 02/21/2020   CALCIUM 8.9 02/21/2020   PROT 6.4 (L) 02/21/2020   ALBUMIN 3.4 (L) 02/21/2020   AST 16 02/21/2020   ALT 12 02/21/2020   ALKPHOS 44 02/21/2020   BILITOT 0.8 02/21/2020   GFRNONAA >60 02/21/2020   GFRAA >60 02/21/2020    Medications: I have reviewed the patient's current medications.   Assessment/Plan: Squamous cell carcinoma of the upper/middle third of the esophagus Upper endoscopy 01/14/2020-ulcerated mass in the proximal/mid esophagus at 26-31 cm from the incisors, biopsy confirmed invasive moderately differentiated squamous cell carcinoma, PD-L1 combined positive  score 0% CT abdomen/pelvis 12/09/2019-uterine fibroids CT chest 12/30/2019-normal esophagus, new sclerosis of the medial head of the right clavicle likely degenerative PET scan 01/24/2020-long segment of hypermetabolic thickening of the distal esophagus.  For hypermetabolic metastatic lymph nodes, 3 in the mediastinum and 1 in the upper abdomen gastrohepatic ligament.  EUS 02/10/2020-ulcerated mass in the mid esophagus,uT3, 3 suspicious lymph nodes near the primary tumor, malignant appearing lymph node at the gastrohepatic ligament (level 18)-metastatic squamous cell carcinoma Radiation 01/31/2020 Cycle 1 weekly Taxol/carboplatin 02/01/2020 Cycle 2 weekly Taxol/carboplatin 02/08/2020 Cycle 3 weekly Taxol/carboplatin 02/15/2020 Cycle 4 weekly Taxol/carboplatin 02/22/2020 Cycle 5 weekly Taxol/carboplatin 02/29/2020 Radiation completed 03/08/2020 CTs 04/03/2020-esophageal mass appears grossly smaller, regression of previously noted lymphadenopathy in the mediastinum and upper abdomen, nonocclusive pulmonary embolus right-sided pulmonary arteries Upper endoscopy 05/02/2020-treatment effect visible starting approximately 26 cm from the incisors.  Scope passed easily into the stomach and proximal duodenum.  Stomach and proximal duodenum grossly normal.  Scope withdrawn to the mid esophagus with four-quadrant biopsies obtained.  2 biopsy showed acute ulcerative esophagitis; 2 biopsies showed squamous mucosa with minimal histologic abnormality. CTs 07/24/2020-no evidence of metastatic disease, left gastric lymph node has decreased in size, mild wall thickening of the mid to distal esophagus Upper endoscopy 07/25/2020-multiple biopsies with changes of ulcerative esophagitis, negative fungal, HSV, and CMV stains, no evidence of malignancy CTs at [facility] 11/27/2020-interval decrease in prior mediastinal groundglass opacities; decrease in now mild diffuse esophageal wall thickening, most prominent in the mid and distal  esophagus; few  scattered 3 mm and smaller noncalcified pulmonary nodules not significantly changed.  No definite new or enlarging pulmonary nodule.  No evidence of metastatic disease in the abdomen. EGD 11/28/2020-mild and discrete stricture in the distal esophagus; no mass lesions seen.  Biopsies taken, negative for malignancy x4; serial dilatation performed. CTs at [facility] 08/06/2021-no evidence of recurrent disease EGD 08/07/2021-no mass, mild stricture, multiple biopsies negative for carcinoma Right breast cancer July 1996, [name], [name], right lumpectomy and axillary dissection followed by adjuvant Adriamycin/Taxol chemotherapy-4 cycles, 5 years of tamoxifen, and breast radiation   3.   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  Depression   5.  Hypertension   6.  Right eye uveitis, macular edema, and corneal edema   7.  Asthma   8.  Pulmonary embolus on CT 04/03/2020-lovenox initiated, anticoagulation changed to apixaban beginning 08/03/2020, anticoagulation completed approximately the beginning of December 2021   9.  Odynophagia secondary to radiation -resolved      Disposition: Jo Morrison is in clinical remission from esophagus cancer.  She continues surveillance imaging and endoscopy at [facility].  She is scheduled for follow-up at [facility] in late January.  She will return for an office visit in 6 months.  Betsy Coder, MD  08/30/2021  9:09 AM

## 2021-08-30 NOTE — Progress Notes (Signed)
    Chronic Care Management Pharmacy Assistant   Name: Jo Morrison  MRN: [id number] DOB: Dec 07, 1945   Reason for Encounter: Disease State   Conditions to be addressed/monitored: General   Recent office visits:  None ID  Recent consult visits:  08/30/21 Jo Pier, MD-Oncology (Primary squamous cell carcinoma of overlapping sites of esophagus) no med changes  Hospital visits:  None in previous 6 months  Medications: Outpatient Encounter Medications as of 08/30/2021  Medication Sig Note   Bromfenac Sodium (PROLENSA) 0.07 % SOLN Place 1 drop into the right eye 4 (four) times daily. (Patient taking differently: Place 1 drop into the right eye daily in the afternoon.) 02/21/2020: Three times/day   clonazePAM (KLONOPIN) 0.5 MG tablet Take 1 tablet (0.5 mg total) by mouth at bedtime as needed.    dorzolamide-timolol (COSOPT) 22.3-6.8 MG/ML ophthalmic solution Place 1 drop into the right eye 2 (two) times daily.     fluticasone (FLONASE) 50 MCG/ACT nasal spray Place 2 sprays into the nose daily as needed for allergies.     fluticasone furoate-vilanterol (BREO ELLIPTA) 200-25 MCG/INH AEPB Inhale 1 puff into the lungs daily.    guaifenesin (ROBITUSSIN) 100 MG/5ML syrup Take 200 mg by mouth as needed for cough.    hydrochlorothiazide (HYDRODIURIL) 25 MG tablet TAKE 1 TABLET(25 MG) BY MOUTH DAILY    loteprednol (LOTEMAX) 0.5 % ophthalmic suspension Place 1 drop into the right eye 4 (four) times daily.    REFRESH OPTIVE ADVANCED 0.5-1-0.5 % SOLN Place 1 drop into both eyes daily as needed (itching Eyes).     traZODone (DESYREL) 100 MG tablet TAKE 1 TO 2 TABLETS(100 TO 200 MG) BY MOUTH AT BEDTIME AS NEEDED FOR SLEEP    valACYclovir (VALTREX) 500 MG tablet Take 500 mg by mouth daily.    venlafaxine XR (EFFEXOR-XR) 150 MG 24 hr capsule Take 1 capsule (150 mg total) by mouth daily.    No facility-administered encounter medications on file as of 08/30/2021.    Contacted Jo Morrison on  08/30/21, 08/31/21 for general disease state and medication adherence call.  Patient is not > 5 days past due for refill on the following medications per chart history:  [facility] Medications: Medication Name/mg Last Fill Days Supply None ID   What concerns do you have about your medications? Patient states she does not have any concerns about medications  The patient denies side effects with her medications.   How often do you forget or accidentally miss a dose? Never  Do you use a pillbox? No  Are you having any problems getting your medications from your pharmacy? No  Has the cost of your medications been a concern? No If yes, what medication and is patient assistance available or has it been applied for?  Since last visit with CPP, no interventions have been made:   The patient has not had an ED visit since last contact.   The patient denies problems with their health. Patient states she is doing just fine, no new health issues  she denies  concerns or questions for Jo Morrison, Pharm. D at this time.     Care Gaps: Annual wellness visit in last year? No Most Recent BP reading and date:133/82, 08/30/21   CCM appointment on 06/04/22

## 2021-09-21 DIAGNOSIS — J4520 Mild intermittent asthma, uncomplicated: Secondary | ICD-10-CM | POA: Diagnosis not present

## 2021-10-11 ENCOUNTER — Telehealth: Payer: Self-pay

## 2021-10-11 NOTE — Telephone Encounter (Signed)
LVM for patient to schedule a physical by 11/23/21

## 2021-10-24 ENCOUNTER — Other Ambulatory Visit: Payer: Self-pay | Admitting: Internal Medicine

## 2021-10-28 ENCOUNTER — Encounter: Payer: Self-pay | Admitting: Internal Medicine

## 2021-11-05 DIAGNOSIS — B0232 Zoster iridocyclitis: Secondary | ICD-10-CM | POA: Diagnosis not present

## 2021-11-09 DIAGNOSIS — H26492 Other secondary cataract, left eye: Secondary | ICD-10-CM | POA: Diagnosis not present

## 2021-11-09 DIAGNOSIS — H40051 Ocular hypertension, right eye: Secondary | ICD-10-CM | POA: Diagnosis not present

## 2021-11-27 DIAGNOSIS — M13831 Other specified arthritis, right wrist: Secondary | ICD-10-CM | POA: Diagnosis not present

## 2021-11-27 DIAGNOSIS — M67431 Ganglion, right wrist: Secondary | ICD-10-CM | POA: Diagnosis not present

## 2021-11-27 DIAGNOSIS — M25531 Pain in right wrist: Secondary | ICD-10-CM | POA: Diagnosis not present

## 2021-11-29 ENCOUNTER — Encounter: Payer: PPO | Admitting: Internal Medicine

## 2021-12-07 DIAGNOSIS — Z947 Corneal transplant status: Secondary | ICD-10-CM | POA: Diagnosis not present

## 2021-12-07 DIAGNOSIS — H401431 Capsular glaucoma with pseudoexfoliation of lens, bilateral, mild stage: Secondary | ICD-10-CM | POA: Diagnosis not present

## 2021-12-07 DIAGNOSIS — H40051 Ocular hypertension, right eye: Secondary | ICD-10-CM | POA: Diagnosis not present

## 2021-12-07 DIAGNOSIS — H18231 Secondary corneal edema, right eye: Secondary | ICD-10-CM | POA: Diagnosis not present

## 2021-12-07 DIAGNOSIS — H26492 Other secondary cataract, left eye: Secondary | ICD-10-CM | POA: Diagnosis not present

## 2021-12-12 ENCOUNTER — Other Ambulatory Visit: Payer: Self-pay | Admitting: Internal Medicine

## 2021-12-12 DIAGNOSIS — Z1231 Encounter for screening mammogram for malignant neoplasm of breast: Secondary | ICD-10-CM

## 2021-12-21 ENCOUNTER — Telehealth: Payer: Self-pay | Admitting: *Deleted

## 2021-12-21 ENCOUNTER — Encounter: Payer: Self-pay | Admitting: *Deleted

## 2021-12-21 NOTE — Telephone Encounter (Signed)
Patient left VM requesting this office reach out to Hendricks Milo at [facility] and inform her that Dr. Benay Spice referred her to Dr. Williemae Natter. She believes this will help get her biopsy that was scheduled for 1/31 to be approved. Her insurance has denied the service. Per Epic-Dr. Benay Spice referred her to [facility], Dr. Fanny Skates, not Dr. Williemae Natter. Called above number and left VM explaining reason for call w/request to return call to discuss situation and why her biopsy was denied.

## 2021-12-24 ENCOUNTER — Encounter: Payer: Self-pay | Admitting: Oncology

## 2021-12-24 DIAGNOSIS — Z01818 Encounter for other preprocedural examination: Secondary | ICD-10-CM | POA: Diagnosis not present

## 2021-12-24 DIAGNOSIS — Z9221 Personal history of antineoplastic chemotherapy: Secondary | ICD-10-CM | POA: Diagnosis not present

## 2021-12-24 DIAGNOSIS — Z923 Personal history of irradiation: Secondary | ICD-10-CM | POA: Diagnosis not present

## 2021-12-24 DIAGNOSIS — K229 Disease of esophagus, unspecified: Secondary | ICD-10-CM | POA: Diagnosis not present

## 2021-12-24 DIAGNOSIS — C154 Malignant neoplasm of middle third of esophagus: Secondary | ICD-10-CM | POA: Diagnosis not present

## 2021-12-25 DIAGNOSIS — I10 Essential (primary) hypertension: Secondary | ICD-10-CM | POA: Diagnosis not present

## 2021-12-25 DIAGNOSIS — M1990 Unspecified osteoarthritis, unspecified site: Secondary | ICD-10-CM | POA: Diagnosis not present

## 2021-12-25 DIAGNOSIS — Z86711 Personal history of pulmonary embolism: Secondary | ICD-10-CM | POA: Diagnosis not present

## 2021-12-25 DIAGNOSIS — Z08 Encounter for follow-up examination after completed treatment for malignant neoplasm: Secondary | ICD-10-CM | POA: Diagnosis not present

## 2021-12-25 DIAGNOSIS — Z8501 Personal history of malignant neoplasm of esophagus: Secondary | ICD-10-CM | POA: Diagnosis not present

## 2021-12-25 DIAGNOSIS — J449 Chronic obstructive pulmonary disease, unspecified: Secondary | ICD-10-CM | POA: Diagnosis not present

## 2021-12-25 DIAGNOSIS — Z79899 Other long term (current) drug therapy: Secondary | ICD-10-CM | POA: Diagnosis not present

## 2021-12-25 DIAGNOSIS — Z86000 Personal history of in-situ neoplasm of breast: Secondary | ICD-10-CM | POA: Diagnosis not present

## 2021-12-25 DIAGNOSIS — Z9221 Personal history of antineoplastic chemotherapy: Secondary | ICD-10-CM | POA: Diagnosis not present

## 2021-12-25 DIAGNOSIS — I700 Atherosclerosis of aorta: Secondary | ICD-10-CM | POA: Diagnosis not present

## 2021-12-25 DIAGNOSIS — Z7951 Long term (current) use of inhaled steroids: Secondary | ICD-10-CM | POA: Diagnosis not present

## 2021-12-25 DIAGNOSIS — Z923 Personal history of irradiation: Secondary | ICD-10-CM | POA: Diagnosis not present

## 2021-12-25 DIAGNOSIS — K222 Esophageal obstruction: Secondary | ICD-10-CM | POA: Diagnosis not present

## 2021-12-25 DIAGNOSIS — F32A Depression, unspecified: Secondary | ICD-10-CM | POA: Diagnosis not present

## 2022-01-01 ENCOUNTER — Encounter: Payer: Self-pay | Admitting: Internal Medicine

## 2022-01-01 ENCOUNTER — Ambulatory Visit (INDEPENDENT_AMBULATORY_CARE_PROVIDER_SITE_OTHER): Payer: PPO | Admitting: Internal Medicine

## 2022-01-01 ENCOUNTER — Other Ambulatory Visit: Payer: Self-pay

## 2022-01-01 VITALS — BP 122/82 | HR 95 | Resp 18 | Ht 63.0 in | Wt 109.6 lb

## 2022-01-01 DIAGNOSIS — I10 Essential (primary) hypertension: Secondary | ICD-10-CM

## 2022-01-01 DIAGNOSIS — C158 Malignant neoplasm of overlapping sites of esophagus: Secondary | ICD-10-CM | POA: Diagnosis not present

## 2022-01-01 DIAGNOSIS — J4520 Mild intermittent asthma, uncomplicated: Secondary | ICD-10-CM | POA: Diagnosis not present

## 2022-01-01 DIAGNOSIS — Z0000 Encounter for general adult medical examination without abnormal findings: Secondary | ICD-10-CM | POA: Diagnosis not present

## 2022-01-01 LAB — LIPID PANEL
Cholesterol: 190 mg/dL (ref 0–200)
HDL: 81.8 mg/dL (ref >39.00)
LDL Cholesterol: 76 mg/dL (ref 0–99)
NonHDL: 108.33
Total CHOL/HDL Ratio: 2
Triglycerides: 160 mg/dL — ABNORMAL HIGH (ref 0.0–149.0)
VLDL: 32 mg/dL (ref 0.0–40.0)

## 2022-01-01 MED ORDER — FLUTICASONE FUROATE-VILANTEROL 200-25 MCG/ACT IN AEPB: 1.0000 | INHALATION_SPRAY | Freq: Every day | RESPIRATORY_TRACT | 3 refills | Status: DC

## 2022-01-01 MED ORDER — HYDROCHLOROTHIAZIDE 25 MG PO TABS: 25.0000 mg | ORAL_TABLET | Freq: Every day | ORAL | 3 refills | Status: DC

## 2022-01-01 NOTE — Progress Notes (Signed)
° °  Subjective:   Patient ID: Jo Morrison, female    DOB: 12-11-1945, 76 y.o.   MRN: [id number]  HPI The patient is a 76 YO female coming in for physical.   PMH, [place], social history reviewed and updated  Review of Systems  Constitutional: Negative.   HENT: Negative.    Eyes: Negative.   Respiratory:  Negative for cough, chest tightness and shortness of breath.   Cardiovascular:  Negative for chest pain, palpitations and leg swelling.  Gastrointestinal:  Negative for abdominal distention, abdominal pain, constipation, diarrhea, nausea and vomiting.  Musculoskeletal: Negative.   Skin: Negative.   Neurological: Negative.   Psychiatric/Behavioral: Negative.     Objective:  Physical Exam Constitutional:      Appearance: She is well-developed.  HENT:     Head: Normocephalic and atraumatic.  Cardiovascular:     Rate and Rhythm: Normal rate and regular rhythm.  Pulmonary:     Effort: Pulmonary effort is normal. No respiratory distress.     Breath sounds: Normal breath sounds. No wheezing or rales.  Abdominal:     General: Bowel sounds are normal. There is no distension.     Palpations: Abdomen is soft.     Tenderness: There is no abdominal tenderness. There is no rebound.  Musculoskeletal:     Cervical back: Normal range of motion.  Skin:    General: Skin is warm and dry.  Neurological:     Mental Status: She is alert and oriented to person, place, and time.     Coordination: Coordination normal.    Vitals:   01/01/22 1306  BP: 122/82  Pulse: 95  Resp: 18  SpO2: 98%  Weight: 109 lb 9.6 oz (49.7 kg)  Height: 5\' 3"  (1.6 m)   This visit occurred during the SARS-CoV-2 public health emergency.  Safety protocols were in place, including screening questions prior to the visit, additional usage of staff PPE, and extensive cleaning of exam room while observing appropriate contact time as indicated for disinfecting solutions.   Assessment & Plan:

## 2022-01-05 NOTE — Assessment & Plan Note (Signed)
Flu shot up to date. Covid-19 up to date. Pneumonia complete. Shingrix complete. Tetanus up to date. Colonoscopy up to date. Mammogram up to date, pap smear aged out and dexa up to date. Counseled about sun safety and mole surveillance. Counseled about the dangers of distracted driving. Given 10 year screening recommendations.   

## 2022-01-05 NOTE — Assessment & Plan Note (Signed)
Discussed recent biopsies with patient and husband.

## 2022-01-05 NOTE — Assessment & Plan Note (Signed)
Using breo and albuterol with good relief.

## 2022-01-05 NOTE — Assessment & Plan Note (Signed)
BP at goal with hctz and reviewed recent CMP. Checking lipid panel today.

## 2022-01-07 DIAGNOSIS — H2011 Chronic iridocyclitis, right eye: Secondary | ICD-10-CM | POA: Diagnosis not present

## 2022-01-07 DIAGNOSIS — Z947 Corneal transplant status: Secondary | ICD-10-CM | POA: Diagnosis not present

## 2022-01-07 DIAGNOSIS — H401431 Capsular glaucoma with pseudoexfoliation of lens, bilateral, mild stage: Secondary | ICD-10-CM | POA: Diagnosis not present

## 2022-01-07 DIAGNOSIS — H35351 Cystoid macular degeneration, right eye: Secondary | ICD-10-CM | POA: Diagnosis not present

## 2022-01-21 ENCOUNTER — Other Ambulatory Visit: Payer: Self-pay

## 2022-01-21 ENCOUNTER — Ambulatory Visit
Admission: RE | Admit: 2022-01-21 | Discharge: 2022-01-21 | Disposition: A | Discharge status: Home / Self Care | Payer: PPO | Source: Ambulatory Visit | Attending: Internal Medicine | Admitting: Internal Medicine

## 2022-01-21 ENCOUNTER — Ambulatory Visit: Payer: PPO

## 2022-01-21 DIAGNOSIS — Z1231 Encounter for screening mammogram for malignant neoplasm of breast: Secondary | ICD-10-CM | POA: Diagnosis not present

## 2022-01-21 HISTORY — DX: Personal history of antineoplastic chemotherapy: Z92.21

## 2022-01-21 HISTORY — DX: Personal history of irradiation: Z92.3

## 2022-02-08 ENCOUNTER — Other Ambulatory Visit: Payer: Self-pay | Admitting: Physician Assistant

## 2022-02-15 ENCOUNTER — Ambulatory Visit (INDEPENDENT_AMBULATORY_CARE_PROVIDER_SITE_OTHER): Payer: PPO | Admitting: Physician Assistant

## 2022-02-15 ENCOUNTER — Encounter: Payer: Self-pay | Admitting: Physician Assistant

## 2022-02-15 ENCOUNTER — Other Ambulatory Visit: Payer: Self-pay

## 2022-02-15 DIAGNOSIS — G4700 Insomnia, unspecified: Secondary | ICD-10-CM

## 2022-02-15 DIAGNOSIS — F3342 Major depressive disorder, recurrent, in full remission: Secondary | ICD-10-CM | POA: Diagnosis not present

## 2022-02-15 DIAGNOSIS — F411 Generalized anxiety disorder: Secondary | ICD-10-CM | POA: Diagnosis not present

## 2022-02-15 MED ORDER — TRAZODONE HCL 100 MG PO TABS: 100.0000 mg | ORAL_TABLET | Freq: Every evening | ORAL | 3 refills | Status: DC | PRN

## 2022-02-15 MED ORDER — VENLAFAXINE HCL ER 150 MG PO CP24: ORAL_CAPSULE | ORAL | 3 refills | Status: DC

## 2022-02-15 MED ORDER — CLONAZEPAM 0.5 MG PO TABS: 0.2500 mg | ORAL_TABLET | Freq: Two times a day (BID) | ORAL | 1 refills | Status: DC | PRN

## 2022-02-15 NOTE — Progress Notes (Signed)
[facility] Med Check ? ?Patient ID: Jo Morrison,  ?MRN: [id number] ? ?PCP: Hoyt Koch, MD ? ?Date of Evaluation: 02/15/2022 ?Time spent:20 minutes ? ?Chief Complaint:  ?Chief Complaint   ?Anxiety; Depression; Insomnia; [name] ?  ? ? ?HISTORY/CURRENT STATUS: ?HPI For annual med check. ? ?She is doing really well with her physical and mental health.  See PMH.  Feels like her medications are still working very well.  She has not needed the Klonopin in quite a while and still has some of the old prescription.  She does take the trazodone every night.  If she does not take it she has a hard time going to sleep and staying asleep.  She feels well rested when she gets up. ? ?Patient denies loss of interest in usual activities and is able to enjoy things.  Denies decreased energy or motivation.  Appetite has not changed. Works out w/ a Physiological scientist twice a week. Has gained some weight, which is a good thing for her, after the esophageal cancer she had lost quite a bit.  No extreme sadness, tearfulness, or feelings of hopelessness.  Denies any changes in concentration, making decisions or remembering things.  Denies suicidal or homicidal thoughts. ? ?Her husband has just found out this week that he has a heart valve that is not functioning properly.  They will be seeing a cardiologist next week to see what the plan will be.  That has been traumatic but she is handling it well. ? ?Denies dizziness, syncope, seizures, numbness, tingling, tremor, tics, unsteady gait, slurred speech, confusion. Denies muscle or joint pain, stiffness, or dystonia. ? ?Individual Medical History/ Review of Systems: Changes? :Yes    corneal transplant since last visit. ?Esophageal cancer is stable.  She goes back for routine biopsy in June.  If all is well at that visit then the plan will be for her to go to 74-monthcheckups as opposed to 4 months that she is currently doing. ? ?Past medications for mental health diagnoses  include: ?Effexor XR, trazodone, Klonopin, Sonata ? ?Allergies: Latex ? ?Current Medications:  ?Current Outpatient Medications:  ?  Bromfenac Sodium (PROLENSA) 0.07 % SOLN, Place 1 drop into the right eye 4 (four) times daily. (Patient taking differently: Place 1 drop into the right eye daily in the afternoon.), Disp: 6 mL, Rfl: 3 ?  dorzolamide-timolol (COSOPT) 22.3-6.8 MG/ML ophthalmic solution, Place 1 drop into the right eye 2 (two) times daily. , Disp: , Rfl:  ?  fluticasone (FLONASE) 50 MCG/ACT nasal spray, Place 2 sprays into the nose daily as needed for allergies. , Disp: , Rfl:  ?  fluticasone furoate-vilanterol (BREO ELLIPTA) 200-25 MCG/ACT AEPB, Inhale 1 puff into the lungs daily., Disp: 3 each, Rfl: 3 ?  guaifenesin (ROBITUSSIN) 100 MG/5ML syrup, Take 200 mg by mouth as needed for cough., Disp: , Rfl:  ?  hydrochlorothiazide (HYDRODIURIL) 25 MG tablet, Take 1 tablet (25 mg total) by mouth daily., Disp: 90 tablet, Rfl: 3 ?  loteprednol (LOTEMAX) 0.5 % ophthalmic suspension, Place 1 drop into the right eye 4 (four) times daily., Disp: , Rfl:  ?  REFRESH OPTIVE ADVANCED 0.5-1-0.5 % SOLN, Place 1 drop into both eyes daily as needed (itching Eyes). , Disp: , Rfl:  ?  valACYclovir (VALTREX) 500 MG tablet, Take 500 mg by mouth daily., Disp: , Rfl:  ?  clonazePAM (KLONOPIN) 0.5 MG tablet, Take 0.5-1 tablets (0.25-0.5 mg total) by mouth 2 (two) times daily as needed., Disp: 30 tablet,  Rfl: 1 ?  traZODone (DESYREL) 100 MG tablet, Take 1-2 tablets (100-200 mg total) by mouth at bedtime as needed for sleep., Disp: 180 tablet, Rfl: 3 ?  venlafaxine XR (EFFEXOR-XR) 150 MG 24 hr capsule, TAKE 1 CAPSULE(150 MG) BY MOUTH DAILY, Disp: 90 capsule, Rfl: 3 ?Medication Side Effects: none ? ?Family Medical/ Social History: Changes? No ? ?MENTAL HEALTH EXAM: ? ?There were no vitals taken for this visit.There is no height or weight on file to calculate BMI.  ?General Appearance: Casual, Neat and Well Groomed  ?Eye Contact:  Good   ?Speech:  Clear and Coherent and Normal Rate  ?Volume:  Normal  ?Mood:  Euthymic  ?Affect:  Appropriate  ?Thought Process:  Goal Directed and Descriptions of Associations: Circumstantial  ?Orientation:  Full (Time, Place, and Person)  ?Thought Content: Logical   ?Suicidal Thoughts:  No  ?Homicidal Thoughts:  No  ?Memory:  WNL  ?Judgement:  Good  ?Insight:  Good  ?Psychomotor Activity:  Normal  ?Concentration:  Concentration: Good  ?Recall:  Good  ?Fund of Knowledge: Good  ?Language: Good  ?Assets:  Desire for Improvement  ?ADL's:  Intact  ?Cognition: WNL  ?Prognosis:  Good  ? ? ?DIAGNOSES:  ?  ICD-10-CM   ?1. Recurrent major depressive disorder, in full remission ([place])  F33.42   ?  ?2. Generalized anxiety disorder  F41.1   ?  ?3. Insomnia, unspecified type  G47.00   ?  ? ? ? ?Receiving Psychotherapy: No  ? ? ?RECOMMENDATIONS:  ?PDMP was reviewed.  No results available. ?I provided 20 minutes of face to face time during this encounter, including time spent before and after the visit in records review, medical decision making, counseling pertinent to today's visit, and charting.  ?I am glad to see her doing well as far as her medications go.  No changes will be made. ?I hope her husband gets along okay and that she continues to get good results when she follows up for her history of esophageal cancer. ?She prefers to come annually and has been doing that for several years so I see no reason to have her come more often.  She will definitely let me know if anything should change. ? ?Continue Klonopin 0.5 mg, 1/2-1 p.o. twice daily as needed.   ?Continue Effexor XR 150 mg, 1 p.o. daily. ?Continue trazodone 100 mg, 1-2 nightly as needed sleep. ?Return in 1 year. ? ?Donnal Moat, PA-C  ?

## 2022-03-04 ENCOUNTER — Ambulatory Visit: Payer: PPO | Admitting: Oncology

## 2022-03-06 ENCOUNTER — Encounter: Payer: Self-pay | Admitting: Internal Medicine

## 2022-03-07 MED ORDER — NITROFURANTOIN MONOHYD MACRO 100 MG PO CAPS: 100.0000 mg | ORAL_CAPSULE | Freq: Two times a day (BID) | ORAL | 0 refills | Status: DC

## 2022-03-14 ENCOUNTER — Inpatient Hospital Stay: Payer: PPO | Attending: Oncology | Admitting: Oncology

## 2022-03-14 VITALS — BP 128/70 | HR 100 | Temp 98.1°F | Resp 18 | Ht 63.0 in | Wt 110.4 lb

## 2022-03-14 DIAGNOSIS — C153 Malignant neoplasm of upper third of esophagus: Secondary | ICD-10-CM | POA: Diagnosis not present

## 2022-03-14 DIAGNOSIS — Z86000 Personal history of in-situ neoplasm of breast: Secondary | ICD-10-CM | POA: Diagnosis not present

## 2022-03-14 DIAGNOSIS — Z923 Personal history of irradiation: Secondary | ICD-10-CM | POA: Diagnosis not present

## 2022-03-14 DIAGNOSIS — Z853 Personal history of malignant neoplasm of breast: Secondary | ICD-10-CM | POA: Insufficient documentation

## 2022-03-14 DIAGNOSIS — Z9221 Personal history of antineoplastic chemotherapy: Secondary | ICD-10-CM | POA: Insufficient documentation

## 2022-03-14 DIAGNOSIS — C158 Malignant neoplasm of overlapping sites of esophagus: Secondary | ICD-10-CM

## 2022-03-14 DIAGNOSIS — C154 Malignant neoplasm of middle third of esophagus: Secondary | ICD-10-CM | POA: Insufficient documentation

## 2022-03-14 NOTE — Progress Notes (Signed)
?[facility] ?OFFICE PROGRESS NOTE ? ? ?Diagnosis: Esophagus cancer ? ?INTERVAL HISTORY:  ? ?Jo Morrison returns as scheduled.  She feels well.  No dysphagia.  No pain.  She is exercising.  No dyspnea.  Good appetite. ?She saw Dr. Williemae Natter 12/25/2021.  An endoscopy found a mild stricture in the distal esophagus with no mass.  Biopsies were negative for malignancy.  The biopsy at 30 cm revealed focal atypia consistent with reactive atypia versus low-grade squamous dysplasia.  No high-grade dysplasia or carcinoma. ?She is scheduled for a repeat endoscopy in June. ? ?Objective: ? ?Vital signs in last 24 hours: ? ?Blood pressure 128/70, pulse 100, temperature 98.1 ?F (36.7 ?C), temperature source Oral, resp. rate 18, height _0  (1.6 m), weight 110 lb 6.4 oz (50.1 kg), SpO2 100 %. ?  ? ?HEENT: Neck without mass ?Lymphatics: No cervical, supraclavicular, axillary, or inguinal nodes ?Resp: Lungs clear bilaterally ?Cardio: Regular rate and rhythm ?GI: No hepatosplenomegaly, nontender ?Vascular: No leg edema ?  ? ?Lab Results: ? ?Lab Results  ?Component Value Date  ? WBC 3.1 (L) 03/13/2020  ? HGB 13.2 03/13/2020  ? HCT 38.9 03/13/2020  ? MCV 95.1 03/13/2020  ? PLT 193 03/13/2020  ? NEUTROABS 1.9 03/13/2020  ? ? ?CMP  ?Lab Results  ?Component Value Date  ? NA 141 02/21/2020  ? K 3.5 02/21/2020  ? CL 106 02/21/2020  ? CO2 28 02/21/2020  ? GLUCOSE 146 (H) 02/21/2020  ? BUN 10 02/21/2020  ? CREATININE 0.71 02/21/2020  ? CALCIUM 8.9 02/21/2020  ? PROT 6.4 (L) 02/21/2020  ? ALBUMIN 3.4 (L) 02/21/2020  ? AST 16 02/21/2020  ? ALT 12 02/21/2020  ? ALKPHOS 44 02/21/2020  ? BILITOT 0.8 02/21/2020  ? GFRNONAA >60 02/21/2020  ? GFRAA >60 02/21/2020  ? ? ?No results found for: CEA1, CEA, [id number], CA125 ? ?No results found for: INR, LABPROT ? ?Imaging: ? ?No results found. ? ?Medications: I have reviewed the patient's current medications. ? ? 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  For hypermetabolic metastatic lymph nodes, 3 in the mediastinum and 1 in the upper abdomen gastrohepatic ligament.  ?EUS 02/10/2020-ulcerated mass in the mid esophagus,uT3, 3 suspicious lymph nodes near the primary tumor, malignant appearing lymph node at the gastrohepatic ligament (level 18)-metastatic squamous cell carcinoma ?Radiation 01/31/2020 ?Cycle 1 weekly Taxol/carboplatin 02/01/2020 ?Cycle 2 weekly Taxol/carboplatin 02/08/2020 ?Cycle 3 weekly Taxol/carboplatin 02/15/2020 ?Cycle 4 weekly Taxol/carboplatin 02/22/2020 ?Cycle 5 weekly Taxol/carboplatin 02/29/2020 ?Radiation completed 03/08/2020 ?CTs 04/03/2020-esophageal mass appears grossly smaller, regression of previously noted lymphadenopathy in the mediastinum and upper abdomen, nonocclusive pulmonary embolus right-sided pulmonary arteries ?Upper endoscopy 05/02/2020-treatment effect visible starting approximately 26 cm from the incisors.  Scope passed easily into the stomach and proximal duodenum.  Stomach and proximal duodenum grossly normal.  Scope withdrawn to the mid esophagus with four-quadrant biopsies obtained.  2 biopsy showed acute ulcerative esophagitis; 2 biopsies showed squamous mucosa with minimal histologic abnormality. ?CTs 07/24/2020-no evidence of metastatic disease, left gastric lymph node has decreased in size, mild wall thickening of the mid to distal esophagus ?Upper endoscopy 07/25/2020-multiple biopsies with changes of  ulcerative esophagitis, negative fungal, HSV, and CMV stains, no  evidence of malignancy ?CTs at [facility] 11/27/2020-interval decrease in prior mediastinal groundglass opacities; decrease in now mild diffuse esophageal wall thickening, most prominent in the mid and distal esophagus; few scattered 3 mm and smaller noncalcified pulmonary nodules not significantly changed.  No definite new or enlarging pulmonary nodule.  No evidence of metastatic disease in the abdomen. ?EGD 11/28/2020-mild and discrete stricture in the distal esophagus; no mass lesions seen.  Biopsies taken, negative for malignancy x4; serial dilatation performed. ?CTs at [facility] 08/06/2021-no evidence of recurrent disease ?EGD 08/07/2021-no mass, mild stricture, multiple biopsies negative for carcinoma ?EGD at [facility] 12/25/2021-mild stricture, no mass, biopsies negative for carcinoma, 1 biopsy with possible low-grade dysplasia ?CTs 12/24/2021-negative for recurrent disease ?Right breast cancer July 1996, [name], [name], right lumpectomy and axillary dissection followed by adjuvant Adriamycin/Taxol chemotherapy-4 cycles, 5 years of tamoxifen, and breast radiation ?  ?3.   Left breast DCIS January 2011 -1.1 cm, [name], [name], left breast lumpectomy followed by adjuvant radiation and 5 years of tamoxifen ?  ?4.  Depression ?  ?5.  Hypertension ?  ?6.  Right eye uveitis, macular edema, and corneal edema ?  ?7.  Asthma ?  ?8.  Pulmonary embolus on CT 04/03/2020-lovenox initiated, anticoagulation changed to apixaban beginning 08/03/2020, anticoagulation completed approximately the beginning of December 2021 ?  ?9.  Odynophagia secondary to radiation -resolved ?  ? ? ? ? ?Disposition: ?Ms. Steffensen is in clinical remission from esophagus cancer.  She will follow-up at [facility] in June.  She will return for an office visit in 6 months. ? ?Betsy Coder, MD ? ?03/14/2022  ?9:06 AM ? ? ?

## 2022-03-15 DIAGNOSIS — B0232 Zoster iridocyclitis: Secondary | ICD-10-CM | POA: Diagnosis not present

## 2022-03-30 ENCOUNTER — Other Ambulatory Visit: Payer: Self-pay | Admitting: Physician Assistant

## 2022-04-03 ENCOUNTER — Ambulatory Visit (INDEPENDENT_AMBULATORY_CARE_PROVIDER_SITE_OTHER): Payer: PPO

## 2022-04-03 DIAGNOSIS — Z0000 Encounter for general adult medical examination without abnormal findings: Secondary | ICD-10-CM

## 2022-04-03 NOTE — Patient Instructions (Signed)
Ms. Garcilazo , ?Thank you for taking time to come for your Medicare Wellness Visit. I appreciate your ongoing commitment to your health goals. Please review the following plan we discussed and let me know if I can assist you in the future.  ? ?Screening recommendations/referrals: ?Colonoscopy: 03/12/2018; due every 5 years ?Mammogram: 01/21/2022; due every year ?Bone Density: 06/21/2014; due every 5 years ?Recommended yearly ophthalmology/optometry visit for glaucoma screening and checkup ?Recommended yearly dental visit for hygiene and checkup ? ?Vaccinations: ?Influenza vaccine: 08/12/2021 ?Pneumococcal vaccine: 06/16/2013, 12/13/2014 ?Tdap vaccine: 08/10/2015 ?Shingles vaccine: 06/22/2017, 09/10/2017   ?Covid-19: 12/05/2019, 12/26/2019, 08/13/2020, 09/22/2021 ? ?Advanced directives: Yes; documents on file ? ?Conditions/risks identified: Yes ? ?Next appointment: Please schedule your next Medicare Wellness Visit with your Nurse Health Advisor in 1 year by calling [phone]. ? ? ?Preventive Care 76 Years and Older, Female ?Preventive care refers to lifestyle choices and visits with your health care provider that can promote health and wellness. ?What does preventive care include? ?A yearly physical exam. This is also called an annual well check. ?Dental exams once or twice a year. ?Routine eye exams. Ask your health care provider how often you should have your eyes checked. ?Personal lifestyle choices, including: ?Daily care of your teeth and gums. ?Regular physical activity. ?Eating a healthy diet. ?Avoiding tobacco and drug use. ?Limiting alcohol use. ?Practicing safe sex. ?Taking low-dose aspirin every day. ?Taking vitamin and mineral supplements as recommended by your health care provider. ?What happens during an annual well check? ?The services and screenings done by your health care provider during your annual well check will depend on your age, overall health, lifestyle risk factors, and family history of  disease. ?Counseling  ?Your health care provider may ask you questions about your: ?Alcohol use. ?Tobacco use. ?Drug use. ?Emotional well-being. ?Home and relationship well-being. ?Sexual activity. ?Eating habits. ?History of falls. ?Memory and ability to understand (cognition). ?Work and work Statistician. ?Reproductive health. ?Screening  ?You may have the following tests or measurements: ?Height, weight, and BMI. ?Blood pressure. ?Lipid and cholesterol levels. These may be checked every 5 years, or more frequently if you are over 52 years old. ?Skin check. ?Lung cancer screening. You may have this screening every year starting at age 82 if you have a 30-pack-year history of smoking and currently smoke or have quit within the past 15 years. ?Fecal occult blood test (FOBT) of the stool. You may have this test every year starting at age 64. ?Flexible sigmoidoscopy or colonoscopy. You may have a sigmoidoscopy every 5 years or a colonoscopy every 10 years starting at age 11. ?Hepatitis C blood test. ?Hepatitis B blood test. ?Sexually transmitted disease (STD) testing. ?Diabetes screening. This is done by checking your blood sugar (glucose) after you have not eaten for a while (fasting). You may have this done every 1-3 years. ?Bone density scan. This is done to screen for osteoporosis. You may have this done starting at age 80. ?Mammogram. This may be done every 1-2 years. Talk to your health care provider about how often you should have regular mammograms. ?Talk with your health care provider about your test results, treatment options, and if necessary, the need for more tests. ?Vaccines  ?Your health care provider may recommend certain vaccines, such as: ?Influenza vaccine. This is recommended every year. ?Tetanus, diphtheria, and acellular pertussis (Tdap, Td) vaccine. You may need a Td booster every 10 years. ?Zoster vaccine. You may need this after age 59. ?Pneumococcal 13-valent conjugate (PCV13) vaccine. One  dose  is recommended after age 26. ?Pneumococcal polysaccharide (PPSV23) vaccine. One dose is recommended after age 18. ?Talk to your health care provider about which screenings and vaccines you need and how often you need them. ?This information is not intended to replace advice given to you by your health care provider. Make sure you discuss any questions you have with your health care provider. ?Document Released: 12/08/2015 Document Revised: 07/31/2016 Document Reviewed: 09/12/2015 ?Elsevier Interactive Patient Education ? 2017 [place]. ? ?Fall Prevention in the Home ?Falls can cause injuries. They can happen to people of all ages. There are many things you can do to make your home safe and to help prevent falls. ?What can I do on the outside of my home? ?Regularly fix the edges of walkways and driveways and fix any cracks. ?Remove anything that might make you trip as you walk through a door, such as a raised step or threshold. ?Trim any bushes or trees on the path to your home. ?Use bright outdoor lighting. ?Clear any walking paths of anything that might make someone trip, such as rocks or tools. ?Regularly check to see if handrails are loose or broken. Make sure that both sides of any steps have handrails. ?Any raised decks and porches should have guardrails on the edges. ?Have any leaves, snow, or ice cleared regularly. ?Use sand or salt on walking paths during winter. ?Clean up any spills in your garage right away. This includes oil or grease spills. ?What can I do in the bathroom? ?Use night lights. ?Install grab bars by the toilet and in the tub and shower. Do not use towel bars as grab bars. ?Use non-skid mats or decals in the tub or shower. ?If you need to sit down in the shower, use a plastic, non-slip stool. ?Keep the floor dry. Clean up any water that spills on the floor as soon as it happens. ?Remove soap buildup in the tub or shower regularly. ?Attach bath mats securely with double-sided  non-slip rug tape. ?Do not have throw rugs and other things on the floor that can make you trip. ?What can I do in the bedroom? ?Use night lights. ?Make sure that you have a light by your bed that is easy to reach. ?Do not use any sheets or blankets that are too big for your bed. They should not hang down onto the floor. ?Have a firm chair that has side arms. You can use this for support while you get dressed. ?Do not have throw rugs and other things on the floor that can make you trip. ?What can I do in the kitchen? ?Clean up any spills right away. ?Avoid walking on wet floors. ?Keep items that you use a lot in easy-to-reach places. ?If you need to reach something above you, use a strong step stool that has a grab bar. ?Keep electrical cords out of the way. ?Do not use floor polish or wax that makes floors slippery. If you must use wax, use non-skid floor wax. ?Do not have throw rugs and other things on the floor that can make you trip. ?What can I do with my stairs? ?Do not leave any items on the stairs. ?Make sure that there are handrails on both sides of the stairs and use them. Fix handrails that are broken or loose. Make sure that handrails are as long as the stairways. ?Check any carpeting to make sure that it is firmly attached to the stairs. Fix any carpet that is loose or worn. ?Avoid having  throw rugs at the top or bottom of the stairs. If you do have throw rugs, attach them to the floor with carpet tape. ?Make sure that you have a light switch at the top of the stairs and the bottom of the stairs. If you do not have them, ask someone to add them for you. ?What else can I do to help prevent falls? ?Wear shoes that: ?Do not have high heels. ?Have rubber bottoms. ?Are comfortable and fit you well. ?Are closed at the toe. Do not wear sandals. ?If you use a stepladder: ?Make sure that it is fully opened. Do not climb a closed stepladder. ?Make sure that both sides of the stepladder are locked into place. ?Ask  someone to hold it for you, if possible. ?Clearly mark and make sure that you can see: ?Any grab bars or handrails. ?First and last steps. ?Where the edge of each step is. ?Use tools that help you move around (mobility aids

## 2022-04-03 NOTE — Progress Notes (Signed)
?I connected with Jo Morrison today by telephone and verified that I am speaking with the correct person using two identifiers. ?Location patient: home ?Location provider: work ?Persons participating in the virtual visit: patient, provider. ?  ?I discussed the limitations, risks, security and privacy concerns of performing an evaluation and management service by telephone and the availability of in person appointments. I also discussed with the patient that there may be a patient responsible charge related to this service. The patient expressed understanding and verbally consented to this telephonic visit.  ?  ?Interactive audio and video telecommunications were attempted between this provider and patient, however failed, due to patient having technical difficulties OR patient did not have access to video capability.  We continued and completed visit with audio only. ? ?Some vital signs may be absent or patient reported.  ? ?Time Spent with patient on telephone encounter: 30 minutes ? ?Subjective:  ? Jo Morrison is a 76 y.o. female who presents for Medicare Annual (Subsequent) preventive examination. ? ?Review of Systems    ? ?Cardiac Risk Factors include: advanced age (>34mn, >>37women);family history of premature cardiovascular disease;hypertension ? ?   ?Objective:  ?  ?There were no vitals filed for this visit. ?There is no height or weight on file to calculate BMI. ? ? ?  04/03/2022  ? 10:07 AM 03/12/2021  ?  2:58 PM 10/03/2020  ? 10:48 AM 05/12/2020  ? 12:20 PM 04/12/2020  ? 10:27 AM 03/20/2020  ? 11:14 AM 02/28/2020  ? 11:29 AM  ?Advanced Directives  ?Does Patient Have a Medical Advance Directive? Yes Yes Yes Yes Yes Yes Yes  ?Type of Advance Directive Living will;Healthcare Power of [place] Living will;Healthcare Power of Attorney Living will;Healthcare Power of Attorney  ?Does patient  want to make changes to medical advance directive? No - Patient declined No - Patient declined No - Patient declined No - Patient declined No - Patient declined    ?Copy of [place] Chart? Yes - validated most recent copy scanned in chart (See row information)  Yes - validated most recent copy scanned in chart (See row information) Yes - validated most recent copy scanned in chart (See row information) No - copy requested    ? ? ?Current Medications (verified) ?Outpatient Encounter Medications as of 04/03/2022  ?Medication Sig  ? clonazePAM (KLONOPIN) 0.5 MG tablet Take 0.5-1 tablets (0.25-0.5 mg total) by mouth 2 (two) times daily as needed.  ? dorzolamide-timolol (COSOPT) 22.3-6.8 MG/ML ophthalmic solution Place 1 drop into the right eye 2 (two) times daily.   ? fluticasone (FLONASE) 50 MCG/ACT nasal spray Place 2 sprays into the nose daily as needed for allergies.   ? fluticasone furoate-vilanterol (BREO ELLIPTA) 200-25 MCG/ACT AEPB Inhale 1 puff into the lungs daily.  ? hydrochlorothiazide (HYDRODIURIL) 25 MG tablet Take 1 tablet (25 mg total) by mouth daily.  ? loteprednol (LOTEMAX) 0.5 % ophthalmic suspension Place 1 drop into the right eye 4 (four) times daily.  ? REFRESH OPTIVE ADVANCED 0.5-1-0.5 % SOLN Place 1 drop into both eyes daily as needed (itching Eyes).   ? traZODone (DESYREL) 100 MG tablet Take 1-2 tablets (100-200 mg total) by mouth at bedtime as needed for sleep.  ? venlafaxine XR (EFFEXOR-XR) 150 MG 24 hr capsule TAKE 1 CAPSULE(150 MG) BY MOUTH DAILY  ? [DISCONTINUED] Bromfenac Sodium (PROLENSA) 0.07 % SOLN Place 1 drop into  the right eye 4 (four) times daily. (Patient taking differently: Place 1 drop into the right eye daily in the afternoon.)  ? [DISCONTINUED] guaifenesin (ROBITUSSIN) 100 MG/5ML syrup Take 200 mg by mouth as needed for cough.  ? [DISCONTINUED] nitrofurantoin, macrocrystal-monohydrate, (MACROBID) 100 MG capsule Take 1 capsule (100 mg total) by mouth 2  (two) times daily.  ? [DISCONTINUED] valACYclovir (VALTREX) 500 MG tablet Take 500 mg by mouth daily.  ? ?No facility-administered encounter medications on file as of 04/03/2022.  ? ? ?Allergies (verified) ?Latex  ? ?History: ?Past Medical History:  ?Diagnosis Date  ? Allergy   ? Anxiety   ? Asthma   ? Breast cancer ([facility]) 2012  ? left  ? Breast cancer, right ([facility]) 1996  ? right  ? Cataract   ? cataracts removed  ? Chronic uveitis of right eye   ? Colon polyps   ? Corneal edema of right eye   ? Cystoid macular edema of right eye   ? Depressive disorder   ? DVT (deep venous thrombosis) ([place])   ? Family history of colon cancer   ? Glaucoma   ? not on eye drops at this time.  ? Hypertension   ? Hypertensive retinopathy of both eyes   ? Olecranon bursitis   ? Osteoarthritis   ? Other disorders of bone and cartilage(733.99)   ? Personal history of chemotherapy   ? 1996  ? Personal history of radiation therapy   ? 1996 & 2011  ? Pseudoexfoliation (PXF) glaucoma of both eyes   ? Pseudophakia, both eyes   ? Seasonal allergies   ? Unspecified pruritic disorder   ? ?Past Surgical History:  ?Procedure Laterality Date  ? APPENDECTOMY  1958  ? BREAST LUMPECTOMY Right 1996  ? Dr. Corena Herter in [place]  ? BREAST LUMPECTOMY Left 2011  ? Dr. Pleas Patricia  ? cataract Bilateral   ? CATARACT EXTRACTION Bilateral   ? Dr. Lucita Ferrara  ? COLONOSCOPY  2011-last  ? DUPUYTREN CONTRACTURE RELEASE    ? ESOPHAGOGASTRODUODENOSCOPY (EGD) WITH PROPOFOL N/A 02/10/2020  ? Procedure: ESOPHAGOGASTRODUODENOSCOPY (EGD) WITH PROPOFOL;  Surgeon: Milus Banister, MD;  Location: [facility];  Service: Endoscopy;  Laterality: N/A;  ? FINE NEEDLE ASPIRATION N/A 02/10/2020  ? Procedure: FINE NEEDLE ASPIRATION (FNA) LINEAR;  Surgeon: Milus Banister, MD;  Location: [facility];  Service: Endoscopy;  Laterality: N/A;  ? HAND SURGERY Right 2020  ? LASIK Bilateral 2007  ? POLYPECTOMY    ? ROTATOR CUFF REPAIR  2007  ? TONSILLECTOMY  1967  ? UPPER ESOPHAGEAL ENDOSCOPIC  ULTRASOUND (EUS) N/A 02/10/2020  ? Procedure: UPPER ESOPHAGEAL ENDOSCOPIC ULTRASOUND (EUS);  Surgeon: Milus Banister, MD;  Location: [facility];  Service: Endoscopy;  Laterality: N/A;  ? ?Family History  ?Problem Relation Age of Onset  ? Stroke Father 73  ? Heart disease Brother   ? Colon cancer Maternal Aunt 78  ? Macular degeneration Mother   ? Cancer Maternal Grandmother   ?     unknown type  ? Colon polyps Neg Hx   ? Esophageal cancer Neg Hx   ? Rectal cancer Neg Hx   ? Stomach cancer Neg Hx   ? ?Social History  ? ?Socioeconomic History  ? Marital status: Married  ?  Spouse name: Not on file  ? Number of children: 0  ? Years of education: Not on file  ? Highest education level: Not on file  ?Occupational History  ? Not on file  ?Tobacco  Use  ? Smoking status: Never  ? Smokeless tobacco: Never  ?Vaping Use  ? Vaping Use: Never used  ?Substance and Sexual Activity  ? Alcohol use: Yes  ?  Alcohol/week: 6.0 standard drinks  ?  Types: 6 Glasses of wine per week  ? Drug use: No  ? Sexual activity: Yes  ?Other Topics Concern  ? Not on file  ?Social History Narrative  ? Married no children  ? Wine 3 to 4 glasses a day  ? Never smoker  ? No drug use  ? ?Social Determinants of Health  ? ?Financial Resource Strain: Low Risk   ? Difficulty of Paying Living Expenses: Not hard at all  ?Food Insecurity: No Food Insecurity  ? Worried About Charity fundraiser in the Last Year: Never true  ? Ran Out of Food in the Last Year: Never true  ?Transportation Needs: Not on file  ?Physical Activity: Sufficiently Active  ? Days of Exercise per Week: 5 days  ? Minutes of Exercise per Session: 30 min  ?Stress: No Stress Concern Present  ? Feeling of Stress : Not at all  ?Social Connections: Socially Integrated  ? Frequency of Communication with Friends and Family: More than three times a week  ? Frequency of Social Gatherings with Friends and Family: Once a week  ? Attends Religious Services: More than 4 times per year  ? Active Member  of Clubs or Organizations: Yes  ? Attends Archivist Meetings: More than 4 times per year  ? Marital Status: Married  ? ? ?Tobacco Counseling ?Counseling given: Not Answered ? ? ?Clinical Intake:

## 2022-04-05 DIAGNOSIS — Z947 Corneal transplant status: Secondary | ICD-10-CM | POA: Diagnosis not present

## 2022-04-05 DIAGNOSIS — H2011 Chronic iridocyclitis, right eye: Secondary | ICD-10-CM | POA: Diagnosis not present

## 2022-04-05 DIAGNOSIS — H401431 Capsular glaucoma with pseudoexfoliation of lens, bilateral, mild stage: Secondary | ICD-10-CM | POA: Diagnosis not present

## 2022-04-12 DIAGNOSIS — L814 Other melanin hyperpigmentation: Secondary | ICD-10-CM | POA: Diagnosis not present

## 2022-04-12 DIAGNOSIS — L821 Other seborrheic keratosis: Secondary | ICD-10-CM | POA: Diagnosis not present

## 2022-04-12 DIAGNOSIS — L570 Actinic keratosis: Secondary | ICD-10-CM | POA: Diagnosis not present

## 2022-04-12 DIAGNOSIS — D485 Neoplasm of uncertain behavior of skin: Secondary | ICD-10-CM | POA: Diagnosis not present

## 2022-04-12 DIAGNOSIS — L918 Other hypertrophic disorders of the skin: Secondary | ICD-10-CM | POA: Diagnosis not present

## 2022-04-15 ENCOUNTER — Encounter: Payer: Self-pay | Admitting: Internal Medicine

## 2022-06-04 ENCOUNTER — Telehealth: Payer: PPO

## 2022-06-07 DIAGNOSIS — H524 Presbyopia: Secondary | ICD-10-CM | POA: Diagnosis not present

## 2022-06-07 DIAGNOSIS — H401431 Capsular glaucoma with pseudoexfoliation of lens, bilateral, mild stage: Secondary | ICD-10-CM | POA: Diagnosis not present

## 2022-06-07 DIAGNOSIS — H2000 Unspecified acute and subacute iridocyclitis: Secondary | ICD-10-CM | POA: Diagnosis not present

## 2022-06-07 DIAGNOSIS — H35351 Cystoid macular degeneration, right eye: Secondary | ICD-10-CM | POA: Diagnosis not present

## 2022-06-07 DIAGNOSIS — Z947 Corneal transplant status: Secondary | ICD-10-CM | POA: Diagnosis not present

## 2022-06-11 ENCOUNTER — Encounter: Payer: Self-pay | Admitting: Oncology

## 2022-06-11 NOTE — Progress Notes (Incomplete)
[facility] Note  06/18/2022     CHIEF COMPLAINT Patient presents for Retina Evaluation   HISTORY OF PRESENT ILLNESS: Jo Morrison is a 76 y.o. female who presents to the clinic today for:   HPI     Retina Evaluation           Laterality: right eye         Comments   Patient here for Retina Evaluation. Referred by Dr Eliseo Squires. Patient states vision had a corneal transplant. Vision was great. Then developed a couple of bumps on right eyelid. Skin sluffing off. Using ung. Helping. If not helping may need a biopsy. No eye pain. Feels scratchy. DX with esophagul cancer in 2020. Went through radiation and chemo. Goes to [facility] every 6 months for biopsy. So far good.       Last edited by Theodore Demark, COA on 06/18/2022  2:04 PM.      Referring physician: Hoyt Koch, MD [place],  [place] [address]  HISTORICAL INFORMATION:   Selected notes from the MEDICAL RECORD NUMBER Referred by Dr. Truman Hayward:  Ocular Hx- PMH-    CURRENT MEDICATIONS: Current Outpatient Medications (Ophthalmic Drugs)  Medication Sig   dorzolamide-timolol (COSOPT) 22.3-6.8 MG/ML ophthalmic solution Place 1 drop into the right eye 2 (two) times daily.    loteprednol (LOTEMAX) 0.5 % ophthalmic suspension Place 1 drop into the right eye 4 (four) times daily.   REFRESH OPTIVE ADVANCED 0.5-1-0.5 % SOLN Place 1 drop into both eyes daily as needed (itching Eyes).    No current facility-administered medications for this visit. (Ophthalmic Drugs)   Current Outpatient Medications (Other)  Medication Sig   clonazePAM (KLONOPIN) 0.5 MG tablet Take 0.5-1 tablets (0.25-0.5 mg total) by mouth 2 (two) times daily as needed.   fluticasone (FLONASE) 50 MCG/ACT nasal spray Place 2 sprays into the nose daily as needed for allergies.    fluticasone furoate-vilanterol (BREO ELLIPTA) 200-25 MCG/ACT AEPB Inhale 1 puff into the lungs daily.   hydrochlorothiazide (HYDRODIURIL)  25 MG tablet Take 1 tablet (25 mg total) by mouth daily.   traZODone (DESYREL) 100 MG tablet Take 1-2 tablets (100-200 mg total) by mouth at bedtime as needed for sleep.   venlafaxine XR (EFFEXOR-XR) 150 MG 24 hr capsule TAKE 1 CAPSULE(150 MG) BY MOUTH DAILY   No current facility-administered medications for this visit. (Other)      REVIEW OF SYSTEMS: ROS   Positive for: Eyes Negative for: Constitutional, Gastrointestinal, Neurological, Skin, Genitourinary, Musculoskeletal, HENT, Endocrine, Cardiovascular, Respiratory, Psychiatric, Allergic/Imm, Heme/Lymph Last edited by Theodore Demark, COA on 06/18/2022  2:04 PM.       ALLERGIES Allergies  Allergen Reactions   Latex Shortness Of Breath    PAST MEDICAL HISTORY Past Medical History:  Diagnosis Date   Allergy    Anxiety    Asthma    Breast cancer ([place]) 2012   left   Breast cancer, right ([place]) 1996   right   Cataract    cataracts removed   Chronic uveitis of right eye    Colon polyps    Corneal edema of right eye    Cystoid macular edema of right eye    Depressive disorder    DVT (deep venous thrombosis) ([place])    Family history of colon cancer    Glaucoma    not on eye drops at this time.   Hypertension    Hypertensive retinopathy of both eyes  Olecranon bursitis    Osteoarthritis    Other disorders of bone and cartilage(733.99)    Personal history of chemotherapy    1996   Personal history of radiation therapy    1996 & 2011   Pseudoexfoliation (PXF) glaucoma of both eyes    Pseudophakia, both eyes    Seasonal allergies    Unspecified pruritic disorder    Past Surgical History:  Procedure Laterality Date   APPENDECTOMY  1958   BREAST LUMPECTOMY Right 1996   Dr. Corena Herter in [place]   BREAST LUMPECTOMY Left 2011   Dr. Pleas Patricia   cataract Bilateral    CATARACT EXTRACTION Bilateral    Dr. Lucita Ferrara   COLONOSCOPY  2011-last   DUPUYTREN CONTRACTURE RELEASE     ESOPHAGOGASTRODUODENOSCOPY (EGD)  WITH PROPOFOL N/A 02/10/2020   Procedure: ESOPHAGOGASTRODUODENOSCOPY (EGD) WITH PROPOFOL;  Surgeon: Milus Banister, MD;  Location: [facility];  Service: Endoscopy;  Laterality: N/A;   FINE NEEDLE ASPIRATION N/A 02/10/2020   Procedure: FINE NEEDLE ASPIRATION (FNA) LINEAR;  Surgeon: Milus Banister, MD;  Location: [facility];  Service: Endoscopy;  Laterality: N/A;   HAND SURGERY Right 2020   LASIK Bilateral 2007   POLYPECTOMY     ROTATOR CUFF REPAIR  2007   TONSILLECTOMY  1967   UPPER ESOPHAGEAL ENDOSCOPIC ULTRASOUND (EUS) N/A 02/10/2020   Procedure: UPPER ESOPHAGEAL ENDOSCOPIC ULTRASOUND (EUS);  Surgeon: Milus Banister, MD;  Location: [facility];  Service: Endoscopy;  Laterality: N/A;    FAMILY HISTORY Family History  Problem Relation Age of Onset   Stroke Father 87   Heart disease Brother    Colon cancer Maternal Aunt 1   Macular degeneration Mother    Cancer Maternal Grandmother        unknown type   Colon polyps Neg Hx    Esophageal cancer Neg Hx    Rectal cancer Neg Hx    Stomach cancer Neg Hx     SOCIAL HISTORY Social History   Tobacco Use   Smoking status: Never   Smokeless tobacco: Never  Vaping Use   Vaping Use: Never used  Substance Use Topics   Alcohol use: Yes    Alcohol/week: 6.0 standard drinks of alcohol    Types: 6 Glasses of wine per week   Drug use: No         OPHTHALMIC EXAM:  Base Eye Exam     Visual Acuity (Snellen - Linear)       Right Left   Dist [place] 20/100 20/40   Dist ph [place] 20/50 20/20 -2         Tonometry (Tonopen, 1:56 PM)       Right Left   Pressure 16 14         Pupils       Dark Light Shape React APD   Right 3 2 Round Brisk None   Left 3 2 Round Brisk None         Visual Fields (Counting fingers)       Left Right    Full Full         Extraocular Movement       Right Left    Full, Ortho Full, Ortho         Neuro/Psych     Oriented x3: Yes   Mood/Affect: Normal         Dilation      Both eyes: 1.0% Mydriacyl, 2.5% Phenylephrine @ 1:56 PM  Slit Lamp and Fundus Exam     Slit Lamp Exam       Right Left   Lids/Lashes Dermatochalasis - upper lid, Meibomian gland dysfunction Dermatochalasis - upper lid, Meibomian gland dysfunction   Conjunctiva/Sclera White and quiet, trace temporal injection White and quiet, trace temporal injection   Cornea Mild Arcus, well healed lasik flap, Well healed temporal cataract wounds, trace Punctate epithelial erosions, trace Descemet's folds, DSEK graft in good position  and clear Mild Arcus, well healed lasik flap, Well healed temporal cataract wounds,    Anterior Chamber Deep and quiet, no cell or flare Deep and quiet, no cell or flare   Iris Round and moderately dilated to 83m, mild pseudo exfoliative material on pupil margin Round and moderately dilated to 5.555m  Lens PC IOL in good position PC IOL in good postion with open PC   Anterior Vitreous Vitreous syneresis, no vit cells Vitreous syneresis         Fundus Exam       Right Left   Disc Pink and Sharp, mild inferior PPP Pink and Sharp, temporal PPP   C/D Ratio 0.5 0.6   Macula Flat, Blunted foveal reflex, no heme, no edema Flat, good foveal reflex, mild Retinal pigment epithelial mottling, No heme or edema   Vessels Vascular attenuation, Tortuous, no sheathing or vasculitis Vascular attenuation, Tortuous, no sheathing or vasculitis   Periphery Attached, pigmented CR scar at 0400 equator, reticular degeneration Attached, reticular degeneration greatest nasal, attached, no heme           Refraction     Wearing Rx       Sphere Cylinder Axis   Right -1.75 +1.00 007   Left -2.50 +1.00 157            IMAGING AND PROCEDURES  Imaging and Procedures for 06/18/2022  OCT, Retina - OU - Both Eyes       Right Eye Quality was poor. Central Foveal Thickness: 275. Findings include normal foveal contour, no IRF, no SRF, cystoid macular edema, intraretinal fluid  (No CME/IRF).   Left Eye Quality was good. Central Foveal Thickness: 256. Progression has been stable. Findings include normal foveal contour, no IRF, no SRF, epiretinal membrane (Trace ERM temporal mac).   Notes *Images captured and stored on drive  Diagnosis / Impression:  OD: No CME/IRF OS: NFP, no IRF/SRF- Trace ERM temporal mac  Clinical management:  See below  Abbreviations: NFP - Normal foveal profile. CME - cystoid macular edema. PED - pigment epithelial detachment. IRF - intraretinal fluid. SRF - subretinal fluid. EZ - ellipsoid zone. ERM - epiretinal membrane. ORA - outer retinal atrophy. ORT - outer retinal tubulation. SRHM - subretinal hyper-reflective material              ASSESSMENT/PLAN:    ICD-10-CM   1. Hypertensive retinopathy of both eyes  H35.033 OCT, Retina - OU - Both Eyes    2. Essential hypertension  I10     3. Dry eyes  H04.123     4. Hx of LASIK  Z98.890      1.2. Hypertensive retinopathy OU - discussed importance of tight BP control - monitor   - Follow up as needed  2. Hx of DSEK OD    DSEK surgery 06/13/21  - continue care with Dr.Symsischian    3. Dry eyes OU - recommend artificial tears and lubricating ointment as needed   4. Hx of LASIK   - continue care with Dr.  Lucianne Lei  Ophthalmic Meds Ordered this visit:  No orders of the defined types were placed in this encounter.      Return if symptoms worsen or fail to improve.  There are no Patient Instructions on file for this visit.   Explained the diagnoses, plan, and follow up with the patient and they expressed understanding.  Patient expressed understanding of the importance of proper follow up care.   This document serves as a record of services personally performed by Gardiner Sleeper, MD, PhD. It was created on their behalf by Renaldo Reel, [place] an ophthalmic technician. The creation of this record is the provider's dictation and/or activities during the visit.     Electronically signed by:  Renaldo Reel, COT  06/11/22  2:49 PM  Gardiner Sleeper, M.D., Ph.D. Diseases & Surgery of the Retina and Vitreous [facility]: M myopia (nearsighted); A astigmatism; H hyperopia (farsighted); P presbyopia; Mrx spectacle prescription;  CTL contact lenses; OD right eye; OS left eye; OU both eyes  XT exotropia; ET esotropia; PEK punctate epithelial keratitis; PEE punctate epithelial erosions; DES dry eye syndrome; MGD meibomian gland dysfunction; ATs artificial tears; PFAT's preservative free artificial tears; Show Low nuclear sclerotic cataract; PSC posterior subcapsular cataract; ERM epi-retinal membrane; PVD posterior vitreous detachment; RD retinal detachment; DM diabetes mellitus; DR diabetic retinopathy; NPDR non-proliferative diabetic retinopathy; PDR proliferative diabetic retinopathy; CSME clinically significant macular edema; DME diabetic macular edema; dbh dot blot hemorrhages; CWS cotton wool spot; POAG primary open angle glaucoma; C/D cup-to-disc ratio; HVF humphrey visual field; GVF goldmann visual field; OCT optical coherence tomography; IOP intraocular pressure; BRVO Branch retinal vein occlusion; CRVO central retinal vein occlusion; CRAO central retinal artery occlusion; BRAO branch retinal artery occlusion; RT retinal tear; SB scleral buckle; PPV pars plana vitrectomy; VH Vitreous hemorrhage; PRP panretinal laser photocoagulation; IVK intravitreal kenalog; VMT vitreomacular traction; MH Macular hole;  NVD neovascularization of the disc; NVE neovascularization elsewhere; AREDS age related eye disease study; ARMD age related macular degeneration; POAG primary open angle glaucoma; EBMD epithelial/anterior basement membrane dystrophy; ACIOL anterior chamber intraocular lens; IOL intraocular lens; PCIOL posterior chamber intraocular lens; Phaco/IOL phacoemulsification with intraocular lens placement; [place] photorefractive  keratectomy; LASIK laser assisted in situ keratomileusis; HTN hypertension; DM diabetes mellitus; COPD chronic obstructive pulmonary disease

## 2022-06-17 ENCOUNTER — Other Ambulatory Visit: Payer: Self-pay

## 2022-06-18 ENCOUNTER — Encounter (INDEPENDENT_AMBULATORY_CARE_PROVIDER_SITE_OTHER): Payer: Self-pay | Admitting: Ophthalmology

## 2022-06-18 ENCOUNTER — Ambulatory Visit (INDEPENDENT_AMBULATORY_CARE_PROVIDER_SITE_OTHER): Payer: PPO | Admitting: Ophthalmology

## 2022-06-18 DIAGNOSIS — I10 Essential (primary) hypertension: Secondary | ICD-10-CM

## 2022-06-18 DIAGNOSIS — H35351 Cystoid macular degeneration, right eye: Secondary | ICD-10-CM

## 2022-06-18 DIAGNOSIS — H401490 Capsular glaucoma with pseudoexfoliation of lens, unspecified eye, stage unspecified: Secondary | ICD-10-CM | POA: Diagnosis not present

## 2022-06-18 DIAGNOSIS — H35033 Hypertensive retinopathy, bilateral: Secondary | ICD-10-CM | POA: Diagnosis not present

## 2022-06-18 DIAGNOSIS — Z9889 Other specified postprocedural states: Secondary | ICD-10-CM | POA: Diagnosis not present

## 2022-06-18 DIAGNOSIS — H04123 Dry eye syndrome of bilateral lacrimal glands: Secondary | ICD-10-CM | POA: Diagnosis not present

## 2022-06-18 DIAGNOSIS — Z947 Corneal transplant status: Secondary | ICD-10-CM | POA: Diagnosis not present

## 2022-06-18 DIAGNOSIS — H40041 Steroid responder, right eye: Secondary | ICD-10-CM

## 2022-06-18 DIAGNOSIS — Z961 Presence of intraocular lens: Secondary | ICD-10-CM

## 2022-06-19 ENCOUNTER — Encounter (INDEPENDENT_AMBULATORY_CARE_PROVIDER_SITE_OTHER): Payer: Self-pay | Admitting: Ophthalmology

## 2022-06-19 NOTE — Progress Notes (Signed)
[facility]  06/18/2022     CHIEF COMPLAINT Patient presents for Retina Evaluation   HISTORY OF PRESENT ILLNESS: Jo Morrison is a 76 y.o. female who presents to the clinic today for:   HPI     Retina Evaluation   In right eye.  I, the attending physician,  performed the HPI with the patient and updated documentation appropriately.        Comments   Patient here for Retina Evaluation. Referred by Dr Lucianne Lei. Patient states vision had a corneal transplant. Vision was great. Then developed a couple of bumps on right eyelid. Skin sluffing off. Using ung. Helping. If not helping may need a biopsy. No eye pain. Feels scratchy. DX with esophageal cancer in 2020. Went through radiation and chemo. Goes to [facility] every 6 months for biopsy. So far good.       Last edited by Bernarda Caffey, MD on 06/19/2022  1:11 AM.       Referring physician: Lisabeth Pick, MD [address] [place],  [place] [address]  HISTORICAL INFORMATION:   Selected notes from the MEDICAL RECORD NUMBER Referred by Dr. Quentin Ore for concern of uveitis OD LEE: 09.23.20 Read Drivers) [BCVA: OD: 20/60-- OS: 20/30] Ocular Hx-pseudoexfloiation glaucoma (s/p SLT OU), drusen OU, DES, pseudo OU (2016), lasik (1996) PMH-HTN, cancer, depression   CURRENT MEDICATIONS: Current Outpatient Medications (Ophthalmic Drugs)  Medication Sig   dorzolamide-timolol (COSOPT) 22.3-6.8 MG/ML ophthalmic solution Place 1 drop into the right eye 2 (two) times daily.    loteprednol (LOTEMAX) 0.5 % ophthalmic suspension Place 1 drop into the right eye 4 (four) times daily.   REFRESH OPTIVE ADVANCED 0.5-1-0.5 % SOLN Place 1 drop into both eyes daily as needed (itching Eyes).    No current facility-administered medications for this visit. (Ophthalmic Drugs)   Current Outpatient Medications (Other)  Medication Sig   clonazePAM (KLONOPIN) 0.5 MG tablet Take 0.5-1 tablets (0.25-0.5 mg total) by mouth 2  (two) times daily as needed.   fluticasone (FLONASE) 50 MCG/ACT nasal spray Place 2 sprays into the nose daily as needed for allergies.    fluticasone furoate-vilanterol (BREO ELLIPTA) 200-25 MCG/ACT AEPB Inhale 1 puff into the lungs daily.   hydrochlorothiazide (HYDRODIURIL) 25 MG tablet Take 1 tablet (25 mg total) by mouth daily.   traZODone (DESYREL) 100 MG tablet Take 1-2 tablets (100-200 mg total) by mouth at bedtime as needed for sleep.   venlafaxine XR (EFFEXOR-XR) 150 MG 24 hr capsule TAKE 1 CAPSULE(150 MG) BY MOUTH DAILY   No current facility-administered medications for this visit. (Other)   REVIEW OF SYSTEMS: ROS   Positive for: Eyes Negative for: Constitutional, Gastrointestinal, Neurological, Skin, Genitourinary, Musculoskeletal, HENT, Endocrine, Cardiovascular, Respiratory, Psychiatric, Allergic/Imm, Heme/Lymph Last edited by Theodore Demark, COA on 06/18/2022  2:04 PM.     ALLERGIES Allergies  Allergen Reactions   Latex Shortness Of Breath   PAST MEDICAL HISTORY Past Medical History:  Diagnosis Date   Allergy    Anxiety    Asthma    Breast cancer ([place]) 2012   left   Breast cancer, right ([place]) 1996   right   Cataract    cataracts removed   Chronic uveitis of right eye    Colon polyps    Corneal edema of right eye    Cystoid macular edema of right eye    Depressive disorder    DVT (deep venous thrombosis) ([place])    Family history of colon  cancer    Glaucoma    not on eye drops at this time.   Hypertension    Hypertensive retinopathy of both eyes    Olecranon bursitis    Osteoarthritis    Other disorders of bone and cartilage(733.99)    Personal history of chemotherapy    1996   Personal history of radiation therapy    1996 & 2011   Pseudoexfoliation (PXF) glaucoma of both eyes    Pseudophakia, both eyes    Seasonal allergies    Unspecified pruritic disorder    Past Surgical History:  Procedure Laterality Date   APPENDECTOMY  1958   BREAST  LUMPECTOMY Right 1996   Dr. Corena Herter in [place]   BREAST LUMPECTOMY Left 2011   Dr. Pleas Patricia   cataract Bilateral    CATARACT EXTRACTION Bilateral    Dr. Lucita Ferrara   COLONOSCOPY  2011-last   DUPUYTREN CONTRACTURE RELEASE     ESOPHAGOGASTRODUODENOSCOPY (EGD) WITH PROPOFOL N/A 02/10/2020   Procedure: ESOPHAGOGASTRODUODENOSCOPY (EGD) WITH PROPOFOL;  Surgeon: Milus Banister, MD;  Location: [facility];  Service: Endoscopy;  Laterality: N/A;   FINE NEEDLE ASPIRATION N/A 02/10/2020   Procedure: FINE NEEDLE ASPIRATION (FNA) LINEAR;  Surgeon: Milus Banister, MD;  Location: [facility];  Service: Endoscopy;  Laterality: N/A;   HAND SURGERY Right 2020   LASIK Bilateral 2007   POLYPECTOMY     ROTATOR CUFF REPAIR  2007   TONSILLECTOMY  1967   UPPER ESOPHAGEAL ENDOSCOPIC ULTRASOUND (EUS) N/A 02/10/2020   Procedure: UPPER ESOPHAGEAL ENDOSCOPIC ULTRASOUND (EUS);  Surgeon: Milus Banister, MD;  Location: [facility];  Service: Endoscopy;  Laterality: N/A;   FAMILY HISTORY Family History  Problem Relation Age of Onset   Stroke Father 63   Heart disease Brother    Colon cancer Maternal Aunt 62   Macular degeneration Mother    Cancer Maternal Grandmother        unknown type   Colon polyps Neg Hx    Esophageal cancer Neg Hx    Rectal cancer Neg Hx    Stomach cancer Neg Hx    SOCIAL HISTORY Social History   Tobacco Use   Smoking status: Never   Smokeless tobacco: Never  Vaping Use   Vaping Use: Never used  Substance Use Topics   Alcohol use: Yes    Alcohol/week: 6.0 standard drinks of alcohol    Types: 6 Glasses of wine per week   Drug use: No       OPHTHALMIC EXAM:   Base Eye Exam     Visual Acuity (Snellen - Linear)       Right Left   Dist [place] 20/100 20/40   Dist ph [place] 20/50 20/20 -2         Tonometry (Tonopen, 1:56 PM)       Right Left   Pressure 16 14         Pupils       Dark Light Shape React APD   Right 3 2 Round Brisk None   Left 3 2 Round  Brisk None         Visual Fields (Counting fingers)       Left Right    Full Full         Extraocular Movement       Right Left    Full, Ortho Full, Ortho         Neuro/Psych     Oriented x3: Yes   Mood/Affect: Normal  Dilation     Both eyes: 1.0% Mydriacyl, 2.5% Phenylephrine @ 1:56 PM           Slit Lamp and Fundus Exam     Slit Lamp Exam       Right Left   Lids/Lashes Dermatochalasis - upper lid, Meibomian gland dysfunction Dermatochalasis - upper lid, Meibomian gland dysfunction   Conjunctiva/Sclera White and quiet, trace temporal injection White and quiet, trace temporal injection   Cornea Mild Arcus, well healed lasik flap, Well healed temporal cataract wounds, trace Punctate epithelial erosions, trace Descemet's folds, DSEK graft in good position  and clear Mild Arcus, well healed lasik flap, Well healed temporal cataract wounds,    Anterior Chamber Deep and quiet, no cell or flare Deep and quiet, no cell or flare   Iris Round and moderately dilated to 28m, mild pseudo exfoliative material on pupil margin Round and moderately dilated to 5.578m  Lens PC IOL in good position PC IOL in good postion with open PC   Anterior Vitreous Vitreous syneresis, no vit cells Vitreous syneresis         Fundus Exam       Right Left   Disc Pink and Sharp, mild inferior PPP Pink and Sharp, temporal PPP   C/D Ratio 0.5 0.6   Macula Flat, Blunted foveal reflex, no heme, no edema Flat, good foveal reflex, mild Retinal pigment epithelial mottling, No heme or edema   Vessels Vascular attenuation, Tortuous, no sheathing or vasculitis Vascular attenuation, Tortuous, no sheathing or vasculitis   Periphery Attached, pigmented CR scar at 0400 equator, reticular degeneration Attached, reticular degeneration greatest nasal, attached, no heme           Refraction     Wearing Rx       Sphere Cylinder Axis   Right -1.75 +1.00 007   Left -2.50 +1.00 157            IMAGING AND PROCEDURES  Imaging and Procedures for _0 @  OCT, Retina - OU - Both Eyes       Right Eye Quality was poor. Central Foveal Thickness: 275. Findings include normal foveal contour, no IRF, no SRF (No CME).   Left Eye Quality was good. Central Foveal Thickness: 256. Progression has been stable. Findings include normal foveal contour, no IRF, no SRF, epiretinal membrane (Trace ERM temporal mac).   Notes *Images captured and stored on drive  Diagnosis / Impression:  NFP; no IRF/SRF OU OD: No CME OS: Trace ERM temporal mac  Clinical management:  See below  Abbreviations: NFP - Normal foveal profile. CME - cystoid macular edema. PED - pigment epithelial detachment. IRF - intraretinal fluid. SRF - subretinal fluid. EZ - ellipsoid zone. ERM - epiretinal membrane. ORA - outer retinal atrophy. ORT - outer retinal tubulation. SRHM - subretinal hyper-reflective material            ASSESSMENT/PLAN:    ICD-10-CM   1. Hypertensive retinopathy of both eyes  H35.033     2. Essential hypertension  I10     3. Dry eyes  H04.123     4. Hx of LASIK  Z98.890     5. CME (cystoid macular edema), right  H35.351 OCT, Retina - OU - Both Eyes    6. Pseudoexfoliation (PXF) glaucoma, unspecified glaucoma stage  H40.1490     7. Steroid responder, right eye  H40.041     8. Pseudophakia of both eyes  Z96.1     9. History of Descemet's  stripping endothelial keratoplasty (DSEK)  Z94.7      **pt lost to f/u here since 12.09.2020**  - was referred to and saw Dr. Randell Loop by [facility] team  - referred back here by Dr. Lucianne Lei to f/u  1. 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 Lucianne Lei noted CME on exam 07.14.23 w/ BCVA 20/400  - here today, no CME on exam or OCT and BCVA 20/50  - cont Lotemax and Prolensa per [facility] team  - no additional intervention indicated or recommended  - pt is cleared from a retina standpoint for release to Dr. Lucianne Lei and Dr. Adella Nissen for resumption of primary eye and corneal specialty care    2,3. History of pseudoexfoliation glaucoma and steroid response OD  - s/p SLT x2  - tmax 32 OD while on Pred-Forte  - IOP 16 today  - cont Cosopt and Brim TID OD  4,5. Hypertensive retinopathy OU  - discussed importance of tight BP control  - monitor  6. Pseudophakia OU  - s/p CE/IOL OU (Dr. Astrid Drafts)  - IOLs in good position  - monitor  7. History of Lasik  - 20+ years ago in [place]  8. Dry eyes OU - artificial tears and lubricating ointment as needed  9. H/o DSEK OD  - s/p DSEK 07.20.22 (Semchyshyn)  - graft in good position and clear  Ophthalmic Meds Ordered this visit:  No orders of the defined types were placed in this encounter.    Return if symptoms worsen or fail to improve.  There are no Patient Instructions on file for this visit.   Explained the diagnoses, plan, and follow up with the patient and they expressed understanding.  Patient expressed understanding of the importance of proper follow up care.   This document serves as a record of services personally performed by Gardiner Sleeper, MD, PhD. It was created on their behalf by Roselee Nova, COMT. The creation of this record is the provider's dictation and/or activities during the visit.  Electronically signed by: Roselee Nova, COMT 06/19/22 1:16 AM   This document serves as a record of services personally performed by Gardiner Sleeper, MD, PhD. It was created on their behalf by Ernest Mallick, OA, an ophthalmic assistant. The creation of this record is the provider's dictation and/or activities during the visit.    Electronically signed by: Ernest Mallick,  OA 12.09.2020 1:16 AM  Gardiner Sleeper, M.D., Ph.D. Diseases & Surgery of the Retina and [place] 06/18/2022   I have reviewed the above documentation for accuracy and completeness, and I agree with the above. Gardiner Sleeper, M.D., Ph.D. 06/19/22 1:16 AM   Abbreviations: M myopia (nearsighted); A astigmatism; H hyperopia (farsighted); P presbyopia; Mrx spectacle prescription;  CTL contact lenses; OD right eye; OS left eye; OU both eyes  XT exotropia; ET esotropia; PEK punctate epithelial keratitis; PEE punctate epithelial erosions; DES dry eye syndrome; MGD meibomian gland dysfunction; ATs artificial tears; PFAT's preservative free artificial tears; [place] nuclear sclerotic cataract; PSC posterior subcapsular cataract; ERM epi-retinal membrane; PVD posterior vitreous detachment; RD retinal detachment; DM diabetes mellitus; DR diabetic retinopathy; NPDR non-proliferative  diabetic retinopathy; PDR proliferative diabetic retinopathy; CSME clinically significant macular edema; DME diabetic macular edema; dbh dot blot hemorrhages; CWS cotton wool spot; POAG primary open angle glaucoma; C/D cup-to-disc ratio; HVF humphrey visual field; GVF goldmann visual field; OCT optical coherence tomography; IOP intraocular pressure; BRVO Branch retinal vein occlusion; CRVO central retinal vein occlusion; CRAO central retinal artery occlusion; BRAO branch retinal artery occlusion; RT retinal tear; SB scleral buckle; PPV pars plana vitrectomy; VH Vitreous hemorrhage; PRP panretinal laser photocoagulation; IVK intravitreal kenalog; VMT vitreomacular traction; MH Macular hole;  NVD neovascularization of the disc; NVE neovascularization elsewhere; AREDS age related eye disease study; ARMD age related macular degeneration; POAG primary open angle glaucoma; EBMD epithelial/anterior basement membrane dystrophy; ACIOL anterior chamber intraocular lens; IOL intraocular lens; PCIOL posterior chamber  intraocular lens; Phaco/IOL phacoemulsification with intraocular lens placement; [place] photorefractive keratectomy; LASIK laser assisted in situ keratomileusis; HTN hypertension; DM diabetes mellitus; COPD chronic obstructive pulmonary disease

## 2022-06-20 DIAGNOSIS — D492 Neoplasm of unspecified behavior of bone, soft tissue, and skin: Secondary | ICD-10-CM | POA: Diagnosis not present

## 2022-07-01 DIAGNOSIS — Z7952 Long term (current) use of systemic steroids: Secondary | ICD-10-CM | POA: Diagnosis not present

## 2022-07-01 DIAGNOSIS — H4010X0 Unspecified open-angle glaucoma, stage unspecified: Secondary | ICD-10-CM | POA: Diagnosis not present

## 2022-07-01 DIAGNOSIS — M720 Palmar fascial fibromatosis [Dupuytren]: Secondary | ICD-10-CM | POA: Diagnosis not present

## 2022-07-01 DIAGNOSIS — F419 Anxiety disorder, unspecified: Secondary | ICD-10-CM | POA: Diagnosis not present

## 2022-07-01 DIAGNOSIS — Z01810 Encounter for preprocedural cardiovascular examination: Secondary | ICD-10-CM | POA: Diagnosis not present

## 2022-07-01 DIAGNOSIS — Z923 Personal history of irradiation: Secondary | ICD-10-CM | POA: Diagnosis not present

## 2022-07-01 DIAGNOSIS — I2693 Single subsegmental pulmonary embolism without acute cor pulmonale: Secondary | ICD-10-CM | POA: Diagnosis not present

## 2022-07-01 DIAGNOSIS — I10 Essential (primary) hypertension: Secondary | ICD-10-CM | POA: Diagnosis not present

## 2022-07-01 DIAGNOSIS — J4520 Mild intermittent asthma, uncomplicated: Secondary | ICD-10-CM | POA: Diagnosis not present

## 2022-07-01 DIAGNOSIS — Z8501 Personal history of malignant neoplasm of esophagus: Secondary | ICD-10-CM | POA: Diagnosis not present

## 2022-07-01 DIAGNOSIS — Z7951 Long term (current) use of inhaled steroids: Secondary | ICD-10-CM | POA: Diagnosis not present

## 2022-07-01 DIAGNOSIS — Z79899 Other long term (current) drug therapy: Secondary | ICD-10-CM | POA: Diagnosis not present

## 2022-07-01 DIAGNOSIS — C158 Malignant neoplasm of overlapping sites of esophagus: Secondary | ICD-10-CM | POA: Diagnosis not present

## 2022-07-01 DIAGNOSIS — J479 Bronchiectasis, uncomplicated: Secondary | ICD-10-CM | POA: Diagnosis not present

## 2022-07-01 DIAGNOSIS — Z947 Corneal transplant status: Secondary | ICD-10-CM | POA: Diagnosis not present

## 2022-07-01 DIAGNOSIS — Z9221 Personal history of antineoplastic chemotherapy: Secondary | ICD-10-CM | POA: Diagnosis not present

## 2022-07-01 DIAGNOSIS — R9431 Abnormal electrocardiogram [ECG] [EKG]: Secondary | ICD-10-CM | POA: Diagnosis not present

## 2022-07-01 DIAGNOSIS — C154 Malignant neoplasm of middle third of esophagus: Secondary | ICD-10-CM | POA: Diagnosis not present

## 2022-07-01 DIAGNOSIS — C50511 Malignant neoplasm of lower-outer quadrant of right female breast: Secondary | ICD-10-CM | POA: Diagnosis not present

## 2022-07-01 DIAGNOSIS — Z853 Personal history of malignant neoplasm of breast: Secondary | ICD-10-CM | POA: Diagnosis not present

## 2022-07-02 DIAGNOSIS — Z9889 Other specified postprocedural states: Secondary | ICD-10-CM | POA: Diagnosis not present

## 2022-07-02 DIAGNOSIS — M1990 Unspecified osteoarthritis, unspecified site: Secondary | ICD-10-CM | POA: Diagnosis not present

## 2022-07-02 DIAGNOSIS — Z853 Personal history of malignant neoplasm of breast: Secondary | ICD-10-CM | POA: Diagnosis not present

## 2022-07-02 DIAGNOSIS — F32A Depression, unspecified: Secondary | ICD-10-CM | POA: Diagnosis not present

## 2022-07-02 DIAGNOSIS — Z9221 Personal history of antineoplastic chemotherapy: Secondary | ICD-10-CM | POA: Diagnosis not present

## 2022-07-02 DIAGNOSIS — D0512 Intraductal carcinoma in situ of left breast: Secondary | ICD-10-CM | POA: Diagnosis not present

## 2022-07-02 DIAGNOSIS — I2699 Other pulmonary embolism without acute cor pulmonale: Secondary | ICD-10-CM | POA: Diagnosis not present

## 2022-07-02 DIAGNOSIS — Z923 Personal history of irradiation: Secondary | ICD-10-CM | POA: Diagnosis not present

## 2022-07-02 DIAGNOSIS — Z947 Corneal transplant status: Secondary | ICD-10-CM | POA: Diagnosis not present

## 2022-07-02 DIAGNOSIS — Z08 Encounter for follow-up examination after completed treatment for malignant neoplasm: Secondary | ICD-10-CM | POA: Diagnosis not present

## 2022-07-02 DIAGNOSIS — H269 Unspecified cataract: Secondary | ICD-10-CM | POA: Diagnosis not present

## 2022-07-02 DIAGNOSIS — I10 Essential (primary) hypertension: Secondary | ICD-10-CM | POA: Diagnosis not present

## 2022-07-02 DIAGNOSIS — Z780 Asymptomatic menopausal state: Secondary | ICD-10-CM | POA: Diagnosis not present

## 2022-07-02 DIAGNOSIS — J4520 Mild intermittent asthma, uncomplicated: Secondary | ICD-10-CM | POA: Diagnosis not present

## 2022-07-02 DIAGNOSIS — Z86711 Personal history of pulmonary embolism: Secondary | ICD-10-CM | POA: Diagnosis not present

## 2022-07-02 DIAGNOSIS — J449 Chronic obstructive pulmonary disease, unspecified: Secondary | ICD-10-CM | POA: Diagnosis not present

## 2022-07-02 DIAGNOSIS — Z8501 Personal history of malignant neoplasm of esophagus: Secondary | ICD-10-CM | POA: Diagnosis not present

## 2022-07-02 DIAGNOSIS — C159 Malignant neoplasm of esophagus, unspecified: Secondary | ICD-10-CM | POA: Diagnosis not present

## 2022-07-09 DIAGNOSIS — H0288A Meibomian gland dysfunction right eye, upper and lower eyelids: Secondary | ICD-10-CM | POA: Diagnosis not present

## 2022-07-09 DIAGNOSIS — H40041 Steroid responder, right eye: Secondary | ICD-10-CM | POA: Diagnosis not present

## 2022-07-09 DIAGNOSIS — H401431 Capsular glaucoma with pseudoexfoliation of lens, bilateral, mild stage: Secondary | ICD-10-CM | POA: Diagnosis not present

## 2022-07-09 DIAGNOSIS — H0288B Meibomian gland dysfunction left eye, upper and lower eyelids: Secondary | ICD-10-CM | POA: Diagnosis not present

## 2022-07-09 DIAGNOSIS — Z947 Corneal transplant status: Secondary | ICD-10-CM | POA: Diagnosis not present

## 2022-07-22 ENCOUNTER — Encounter: Payer: Self-pay | Admitting: Oncology

## 2022-07-31 DIAGNOSIS — Z947 Corneal transplant status: Secondary | ICD-10-CM | POA: Diagnosis not present

## 2022-07-31 DIAGNOSIS — H0288A Meibomian gland dysfunction right eye, upper and lower eyelids: Secondary | ICD-10-CM | POA: Diagnosis not present

## 2022-07-31 DIAGNOSIS — H0288B Meibomian gland dysfunction left eye, upper and lower eyelids: Secondary | ICD-10-CM | POA: Diagnosis not present

## 2022-07-31 DIAGNOSIS — H401431 Capsular glaucoma with pseudoexfoliation of lens, bilateral, mild stage: Secondary | ICD-10-CM | POA: Diagnosis not present

## 2022-08-19 ENCOUNTER — Encounter: Payer: Self-pay | Admitting: Family Medicine

## 2022-08-19 ENCOUNTER — Telehealth (INDEPENDENT_AMBULATORY_CARE_PROVIDER_SITE_OTHER): Payer: PPO | Admitting: Family Medicine

## 2022-08-19 VITALS — Ht 63.0 in

## 2022-08-19 DIAGNOSIS — U071 COVID-19: Secondary | ICD-10-CM | POA: Insufficient documentation

## 2022-08-19 MED ORDER — NIRMATRELVIR/RITONAVIR (PAXLOVID)TABLET: 3.0000 | ORAL_TABLET | Freq: Two times a day (BID) | ORAL | 0 refills | Status: AC

## 2022-08-19 NOTE — Progress Notes (Signed)
Established Patient Office Visit  Subjective   Patient ID: Jo Morrison, female    DOB: 22-Jul-1946  Age: 76 y.o. MRN: [id number]  Chief Complaint  Patient presents with   Sore Throat    Sore throat, ear pain, fatigue, headaches exposed to covid symptoms x 2 days. Covid positive 2 days ago.     Sore Throat  Associated symptoms include congestion, coughing and headaches. Pertinent negatives include no abdominal pain, shortness of breath or vomiting.   2-day history of elevated temperature with malaise, headache, sore throat, dry cough without wheezing or difficulty breathing.  She does have a history of asthma but it has not bothered her with this illness.  There is fatigue and muscle aches.  Tested positive for COVID yesterday.    Review of Systems  Constitutional:  Positive for malaise/fatigue.  HENT:  Positive for congestion and sore throat.   Eyes:  Negative for blurred vision, discharge and redness.  Respiratory:  Positive for cough. Negative for sputum production, shortness of breath and wheezing.   Cardiovascular: Negative.   Gastrointestinal:  Negative for abdominal pain, nausea and vomiting.  Genitourinary: Negative.   Musculoskeletal:  Positive for myalgias. Negative for joint pain.  Skin:  Negative for rash.  Neurological:  Positive for headaches. Negative for tingling, loss of consciousness and weakness.  Endo/Heme/Allergies:  Negative for polydipsia.      Objective:     Ht '5\' 3"'$  (1.6 m)   BMI 19.56 kg/m    Physical Exam Constitutional:      General: She is not in acute distress.    Appearance: Normal appearance. She is not ill-appearing, toxic-appearing or diaphoretic.  HENT:     Head: Normocephalic and atraumatic.     Right Ear: External ear normal.     Left Ear: External ear normal.  Eyes:     General: No scleral icterus.       Right eye: No discharge.        Left eye: No discharge.     Extraocular Movements: Extraocular movements intact.      Conjunctiva/sclera: Conjunctivae normal.     Pupils: Pupils are equal, round, and reactive to light.  Pulmonary:     Effort: Pulmonary effort is normal. No respiratory distress.  Musculoskeletal:     Cervical back: No rigidity or tenderness.  Skin:    General: Skin is warm and dry.  Neurological:     Mental Status: She is alert and oriented to person, place, and time.  Psychiatric:        Mood and Affect: Mood normal.        Behavior: Behavior normal.      No results found for any visits on 08/19/22.    The 10-year ASCVD risk score (Arnett DK, et al., 2019) is: 25%    Assessment & Plan:   Problem List Items Addressed This Visit       Other   COVID-19 - Primary   Relevant Medications   nirmatrelvir/ritonavir EUA (PAXLOVID) 20 x 150 MG & 10 x '100MG'$  TABS    Return in about 1 week (around 08/26/2022), or if symptoms worsen or fail to improve.  She is aware that Paxlovid is an experimental medication and has been approved for emergency use.  It is helped a lot of people and  will probably do the same for her.   Libby Maw, MD  Virtual Visit via Video Note  I connected with Jo Morrison on 08/19/22 at  3:20 PM EDT by a video enabled telemedicine application and verified that I am speaking with the correct person using two identifiers.  Location: Patient: home with her husband.  Provider: work   I discussed the limitations of evaluation and management by telemedicine and the availability of in person appointments. The patient expressed understanding and agreed to proceed.  History of Present Illness:    Observations/Objective:   Assessment and Plan:   Follow Up Instructions:    I discussed the assessment and treatment plan with the patient. The patient was provided an opportunity to ask questions and all were answered. The patient agreed with the plan and demonstrated an understanding of the instructions.   The patient was advised to call back or  seek an in-person evaluation if the symptoms worsen or if the condition fails to improve as anticipated.  I provided 20 minutes of non-face-to-face time during this encounter.   Libby Maw, MD

## 2022-08-20 ENCOUNTER — Other Ambulatory Visit: Payer: Self-pay | Admitting: Internal Medicine

## 2022-09-13 ENCOUNTER — Inpatient Hospital Stay: Payer: PPO | Admitting: Oncology

## 2022-10-04 DIAGNOSIS — H40041 Steroid responder, right eye: Secondary | ICD-10-CM | POA: Diagnosis not present

## 2022-10-04 DIAGNOSIS — Z947 Corneal transplant status: Secondary | ICD-10-CM | POA: Diagnosis not present

## 2022-10-04 DIAGNOSIS — H18231 Secondary corneal edema, right eye: Secondary | ICD-10-CM | POA: Diagnosis not present

## 2022-10-09 ENCOUNTER — Encounter: Payer: Self-pay | Admitting: Oncology

## 2022-10-09 ENCOUNTER — Inpatient Hospital Stay: Payer: PPO | Attending: Oncology | Admitting: Oncology

## 2022-10-09 ENCOUNTER — Ambulatory Visit
Admission: EM | Admit: 2022-10-09 | Discharge: 2022-10-09 | Disposition: A | Discharge status: Home / Self Care | Payer: PPO | Attending: Urgent Care | Admitting: Urgent Care

## 2022-10-09 VITALS — BP 131/90 | HR 100 | Temp 98.1°F | Resp 18 | Ht 63.0 in | Wt 110.0 lb

## 2022-10-09 DIAGNOSIS — C158 Malignant neoplasm of overlapping sites of esophagus: Secondary | ICD-10-CM

## 2022-10-09 DIAGNOSIS — F32A Depression, unspecified: Secondary | ICD-10-CM | POA: Diagnosis not present

## 2022-10-09 DIAGNOSIS — I10 Essential (primary) hypertension: Secondary | ICD-10-CM | POA: Diagnosis not present

## 2022-10-09 DIAGNOSIS — C779 Secondary and unspecified malignant neoplasm of lymph node, unspecified: Secondary | ICD-10-CM | POA: Insufficient documentation

## 2022-10-09 DIAGNOSIS — J029 Acute pharyngitis, unspecified: Secondary | ICD-10-CM | POA: Insufficient documentation

## 2022-10-09 DIAGNOSIS — Z853 Personal history of malignant neoplasm of breast: Secondary | ICD-10-CM | POA: Insufficient documentation

## 2022-10-09 DIAGNOSIS — J45909 Unspecified asthma, uncomplicated: Secondary | ICD-10-CM | POA: Insufficient documentation

## 2022-10-09 DIAGNOSIS — J309 Allergic rhinitis, unspecified: Secondary | ICD-10-CM | POA: Diagnosis not present

## 2022-10-09 DIAGNOSIS — Z923 Personal history of irradiation: Secondary | ICD-10-CM | POA: Diagnosis not present

## 2022-10-09 DIAGNOSIS — H209 Unspecified iridocyclitis: Secondary | ICD-10-CM | POA: Insufficient documentation

## 2022-10-09 DIAGNOSIS — H1820 Unspecified corneal edema: Secondary | ICD-10-CM | POA: Diagnosis not present

## 2022-10-09 DIAGNOSIS — J018 Other acute sinusitis: Secondary | ICD-10-CM

## 2022-10-09 DIAGNOSIS — J3489 Other specified disorders of nose and nasal sinuses: Secondary | ICD-10-CM | POA: Diagnosis not present

## 2022-10-09 DIAGNOSIS — Z79899 Other long term (current) drug therapy: Secondary | ICD-10-CM | POA: Insufficient documentation

## 2022-10-09 DIAGNOSIS — Z86711 Personal history of pulmonary embolism: Secondary | ICD-10-CM | POA: Diagnosis not present

## 2022-10-09 MED ORDER — PREDNISONE 10 MG PO TABS: 30.0000 mg | ORAL_TABLET | Freq: Every day | ORAL | 0 refills | Status: DC

## 2022-10-09 MED ORDER — AMOXICILLIN-POT CLAVULANATE 875-125 MG PO TABS: 1.0000 | ORAL_TABLET | Freq: Two times a day (BID) | ORAL | 0 refills | Status: DC

## 2022-10-09 NOTE — ED Triage Notes (Signed)
Pt c/o cough x 4 week-worse x 1 week-NAD-steady gait

## 2022-10-09 NOTE — Progress Notes (Signed)
[facility] OFFICE PROGRESS NOTE   Diagnosis: Esophagus cancer  INTERVAL HISTORY:   Jo Morrison returns as scheduled.  She underwent an upper endoscopy by Dr. Williemae Natter on 07/02/2022.  There was no evidence of cancer.  Biopsies were negative for malignancy.  Reports being diagnosed with COVID-19 approximately 1 month ago.  A few weeks ago she developed a cough, sore throat, and rhinorrhea/tearing.  The symptoms have persisted.  She has mild associated solid/liquid dysphagia.  No fever.  Objective:  Vital signs in last 24 hours:  Blood pressure (!) 131/90, pulse 100, temperature 98.1 F (36.7 C), temperature source Oral, resp. rate 18, height _0  (1.6 m), weight 110 lb (49.9 kg), SpO2 100 %.    HEENT: Neck without mass, pharynx without erythema or exudate Lymphatics: No cervical, supraclavicular, axillary, or inguinal nodes Resp: Lungs clear bilaterally Cardio: Regular rate and rhythm GI: No mass, nontender, no hepatosplenomegaly Vascular: No leg edema   Lab Results:  Lab Results  Component Value Date   WBC 3.1 (L) 03/13/2020   HGB 13.2 03/13/2020   HCT 38.9 03/13/2020   MCV 95.1 03/13/2020   PLT 193 03/13/2020   NEUTROABS 1.9 03/13/2020    CMP  Lab Results  Component Value Date   NA 141 02/21/2020   K 3.5 02/21/2020   CL 106 02/21/2020   CO2 28 02/21/2020   GLUCOSE 146 (H) 02/21/2020   BUN 10 02/21/2020   CREATININE 0.71 02/21/2020   CALCIUM 8.9 02/21/2020   PROT 6.4 (L) 02/21/2020   ALBUMIN 3.4 (L) 02/21/2020   AST 16 02/21/2020   ALT 12 02/21/2020   ALKPHOS 44 02/21/2020   BILITOT 0.8 02/21/2020   GFRNONAA >60 02/21/2020   GFRAA >60 02/21/2020    No results found for: "CEA1", "CEA", "JWJ191", "CA125"  No results found for: "INR", "LABPROT"  Imaging:  No results found.  Medications: I have reviewed the patient's current medications.   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  For hypermetabolic metastatic lymph nodes, 3 in the mediastinum and 1 in the upper abdomen gastrohepatic ligament.  EUS 02/10/2020-ulcerated mass in the mid esophagus,uT3, 3 suspicious lymph nodes near the primary tumor, malignant appearing lymph node at the gastrohepatic ligament (level 18)-metastatic squamous cell carcinoma Radiation 01/31/2020 Cycle 1 weekly Taxol/carboplatin 02/01/2020 Cycle 2 weekly Taxol/carboplatin 02/08/2020 Cycle 3 weekly Taxol/carboplatin 02/15/2020 Cycle 4 weekly Taxol/carboplatin 02/22/2020 Cycle 5 weekly Taxol/carboplatin 02/29/2020 Radiation completed 03/08/2020 CTs 04/03/2020-esophageal mass appears grossly smaller, regression of previously noted lymphadenopathy in the mediastinum and upper abdomen, nonocclusive pulmonary embolus right-sided pulmonary arteries Upper endoscopy 05/02/2020-treatment effect visible starting approximately 26 cm from the incisors.  Scope passed easily into the stomach and proximal duodenum.  Stomach and proximal duodenum grossly normal.  Scope withdrawn to the mid esophagus with four-quadrant biopsies obtained.  2 biopsy showed acute ulcerative esophagitis; 2 biopsies showed squamous mucosa with minimal histologic abnormality. CTs 07/24/2020-no evidence of metastatic disease, left gastric lymph node has decreased in size, mild wall thickening of the mid to distal esophagus Upper endoscopy 07/25/2020-multiple biopsies with changes of ulcerative esophagitis, negative fungal, HSV, and CMV stains, no evidence of  malignancy CTs at  [facility] 11/27/2020-interval decrease in prior mediastinal groundglass opacities; decrease in now mild diffuse esophageal wall thickening, most prominent in the mid and distal esophagus; few scattered 3 mm and smaller noncalcified pulmonary nodules not significantly changed.  No definite new or enlarging pulmonary nodule.  No evidence of metastatic disease in the abdomen. EGD 11/28/2020-mild and discrete stricture in the distal esophagus; no mass lesions seen.  Biopsies taken, negative for malignancy x4; serial dilatation performed. CTs at [facility] 08/06/2021-no evidence of recurrent disease EGD 08/07/2021-no mass, mild stricture, multiple biopsies negative for carcinoma EGD at [facility] 12/25/2021-mild stricture, no mass, biopsies negative for carcinoma, 1 biopsy with possible low-grade dysplasia CTs 12/24/2021-negative for recurrent disease CTs at [facility] 07/01/2022-no evidence of recurrent disease Endoscopy 07/02/2022-negative for malignancy Right breast cancer July 1996, [name], [name], right lumpectomy and axillary dissection followed by adjuvant Adriamycin/Taxol chemotherapy-4 cycles, 5 years of tamoxifen, and breast radiation   3.   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  Depression   5.  Hypertension   6.  Right eye uveitis, macular edema, and corneal edema   7.  Asthma   8.  Pulmonary embolus on CT 04/03/2020-lovenox initiated, anticoagulation changed to apixaban beginning 08/03/2020, anticoagulation completed approximately the beginning of December 2021   9.  Odynophagia secondary to radiation -resolved        Disposition: Jo Morrison is in clinical remission esophagus cancer.  She will follow-up with Dr. Williemae Natter for a repeat endoscopy in February.  She will return for an office visit in 6 months.  She will seek medical attention if the dysphagia does not improve.  Jo Coder, MD  10/09/2022   12:27 PM

## 2022-10-09 NOTE — ED Provider Notes (Signed)
[facility]  Note:  This document was prepared using Systems analyst and may include unintentional dictation errors.  MRN: [id number] DOB: 1946/05/07  Subjective:   Jo Morrison is a 76 y.o. female presenting for 4-week history of persistent coughing worse in the past week with drainage and rhinorrhea.  Patient has longstanding history of allergies, she recently switched to [place] and felt like it was helping.  This time of year can be hard on her.  Also has a history of asthma but no chest pain, shortness of breath or wheezing.  Patient is not a smoker, no vaping, no marijuana use.  No current facility-administered medications for this encounter.  Current Outpatient Medications:    clonazePAM (KLONOPIN) 0.5 MG tablet, Take 0.5-1 tablets (0.25-0.5 mg total) by mouth 2 (two) times daily as needed., Disp: 30 tablet, Rfl: 1   dorzolamide-timolol (COSOPT) 22.3-6.8 MG/ML ophthalmic solution, Place 1 drop into the right eye 2 (two) times daily. , Disp: , Rfl:    fluticasone (FLONASE) 50 MCG/ACT nasal spray, Place 2 sprays into the nose daily as needed for allergies. , Disp: , Rfl:    fluticasone furoate-vilanterol (BREO ELLIPTA) 200-25 MCG/ACT AEPB, Inhale 1 puff into the lungs daily., Disp: 3 each, Rfl: 3   hydrochlorothiazide (HYDRODIURIL) 25 MG tablet, Take 1 tablet (25 mg total) by mouth daily., Disp: 90 tablet, Rfl: 3   loteprednol (LOTEMAX) 0.5 % ophthalmic suspension, Place 1 drop into the right eye 4 (four) times daily., Disp: , Rfl:    REFRESH OPTIVE ADVANCED 0.5-1-0.5 % SOLN, Place 1 drop into both eyes daily as needed (itching Eyes). , Disp: , Rfl:    traZODone (DESYREL) 100 MG tablet, Take 1-2 tablets (100-200 mg total) by mouth at bedtime as needed for sleep., Disp: 180 tablet, Rfl: 3   venlafaxine XR (EFFEXOR-XR) 150 MG 24 hr capsule, TAKE 1 CAPSULE(150 MG) BY MOUTH DAILY, Disp: 90 capsule, Rfl: 3   Allergies  Allergen Reactions   Latex  Shortness Of Breath    Past Medical History:  Diagnosis Date   Allergy    Anxiety    Asthma    Breast cancer ([place]) 2012   left   Breast cancer, right ([place]) 1996   right   Cataract    cataracts removed   Chronic uveitis of right eye    Colon polyps    Corneal edema of right eye    Cystoid macular edema of right eye    Depressive disorder    DVT (deep venous thrombosis) (HCC)    Family history of colon cancer    Glaucoma    not on eye drops at this time.   Hypertension    Hypertensive retinopathy of both eyes    Olecranon bursitis    Osteoarthritis    Other disorders of bone and cartilage(733.99)    Personal history of chemotherapy    1996   Personal history of radiation therapy    1996 & 2011   Pseudoexfoliation (PXF) glaucoma of both eyes    Pseudophakia, both eyes    Seasonal allergies    Unspecified pruritic disorder      Past Surgical History:  Procedure Laterality Date   APPENDECTOMY  1958   BREAST LUMPECTOMY Right 1996   Dr. Corena Herter in [place]   BREAST LUMPECTOMY Left 2011   Dr. Pleas Patricia   cataract Bilateral    CATARACT EXTRACTION Bilateral    Dr. Lucita Ferrara   COLONOSCOPY  2011-last  DUPUYTREN CONTRACTURE RELEASE     ESOPHAGOGASTRODUODENOSCOPY (EGD) WITH PROPOFOL N/A 02/10/2020   Procedure: ESOPHAGOGASTRODUODENOSCOPY (EGD) WITH PROPOFOL;  Surgeon: Milus Banister, MD;  Location: [facility];  Service: Endoscopy;  Laterality: N/A;   FINE NEEDLE ASPIRATION N/A 02/10/2020   Procedure: FINE NEEDLE ASPIRATION (FNA) LINEAR;  Surgeon: Milus Banister, MD;  Location: [facility];  Service: Endoscopy;  Laterality: N/A;   HAND SURGERY Right 2020   LASIK Bilateral 2007   POLYPECTOMY     ROTATOR CUFF REPAIR  2007   TONSILLECTOMY  1967   UPPER ESOPHAGEAL ENDOSCOPIC ULTRASOUND (EUS) N/A 02/10/2020   Procedure: UPPER ESOPHAGEAL ENDOSCOPIC ULTRASOUND (EUS);  Surgeon: Milus Banister, MD;  Location: [facility];  Service: Endoscopy;  Laterality: N/A;     Family History  Problem Relation Age of Onset   Stroke Father 68   Heart disease Brother    Colon cancer Maternal Aunt 43   Macular degeneration Mother    Cancer Maternal Grandmother        unknown type   Colon polyps Neg Hx    Esophageal cancer Neg Hx    Rectal cancer Neg Hx    Stomach cancer Neg Hx     Social History   Tobacco Use   Smoking status: Never   Smokeless tobacco: Never  Vaping Use   Vaping Use: Never used  Substance Use Topics   Alcohol use: Yes    Alcohol/week: 6.0 standard drinks of alcohol    Types: 6 Glasses of wine per week    Comment: daily   Drug use: No    ROS   Objective:   Vitals: BP (!) 149/94 (BP Location: Left Arm)   Pulse (!) 103   Temp 99.4 F (37.4 C) (Oral)   Resp 18   SpO2 96%   Physical Exam Constitutional:      General: She is not in acute distress.    Appearance: Normal appearance. She is well-developed. She is not ill-appearing, toxic-appearing or diaphoretic.  HENT:     Head: Normocephalic and atraumatic.     Nose: Congestion present. No rhinorrhea.     Mouth/Throat:     Mouth: Mucous membranes are moist.     Pharynx: No oropharyngeal exudate or posterior oropharyngeal erythema.     Comments: Thick streaks of drainage.  Eyes:     General: No scleral icterus.       Right eye: No discharge.        Left eye: No discharge.     Extraocular Movements: Extraocular movements intact.  Cardiovascular:     Rate and Rhythm: Normal rate and regular rhythm.     Heart sounds: Normal heart sounds. No murmur heard.    No friction rub. No gallop.  Pulmonary:     Effort: Pulmonary effort is normal. No respiratory distress.     Breath sounds: No stridor. No wheezing, rhonchi or rales.  Chest:     Chest wall: No tenderness.  Skin:    General: Skin is warm and dry.  Neurological:     General: No focal deficit present.     Mental Status: She is alert and oriented to person, place, and time.  Psychiatric:        Mood and  Affect: Mood normal.        Behavior: Behavior normal.     Assessment and Plan :   PDMP not reviewed this encounter.  1. Other acute sinusitis, recurrence not specified   2. Allergic rhinitis, unspecified  seasonality, unspecified trigger     Will start empiric treatment for sinusitis with Augmentin.  Recommended supportive care otherwise including the use of Xyzal as she takes this regularly anyway, will add in prednisone given allergic rhinitis and asthma.  Deferred imaging given clear cardiopulmonary exam, hemodynamically stable vital signs.  Counseled patient on potential for adverse effects with medications prescribed/recommended today, ER and return-to-clinic precautions discussed, patient verbalized understanding.    Jaynee Eagles, PA-C 10/09/22 1500

## 2022-10-12 ENCOUNTER — Telehealth: Payer: Self-pay | Admitting: Emergency Medicine

## 2022-10-12 MED ORDER — CEFDINIR 300 MG PO CAPS: 300.0000 mg | ORAL_CAPSULE | Freq: Two times a day (BID) | ORAL | 0 refills | Status: AC

## 2022-10-12 NOTE — Telephone Encounter (Signed)
Patient verification complete (name and date of birth).   Pt called urgent care stating the prescribed antibiotic gave her diarrhea and she is requesting a new one. New prescription sent by provider, patient made aware of medication and educated on it. All questions answered.

## 2022-10-24 ENCOUNTER — Other Ambulatory Visit: Payer: Self-pay | Admitting: Internal Medicine

## 2022-10-25 ENCOUNTER — Telehealth: Payer: Self-pay

## 2022-10-25 ENCOUNTER — Ambulatory Visit (INDEPENDENT_AMBULATORY_CARE_PROVIDER_SITE_OTHER): Payer: PPO | Admitting: Internal Medicine

## 2022-10-25 ENCOUNTER — Encounter: Payer: Self-pay | Admitting: Internal Medicine

## 2022-10-25 VITALS — BP 120/80 | HR 112 | Temp 98.3°F | Ht 63.0 in | Wt 111.6 lb

## 2022-10-25 DIAGNOSIS — J0110 Acute frontal sinusitis, unspecified: Secondary | ICD-10-CM

## 2022-10-25 DIAGNOSIS — J019 Acute sinusitis, unspecified: Secondary | ICD-10-CM | POA: Insufficient documentation

## 2022-10-25 MED ORDER — NYSTATIN 100000 UNIT/ML MT SUSP: 5.0000 mL | Freq: Three times a day (TID) | OROMUCOSAL | 0 refills | Status: DC

## 2022-10-25 NOTE — Assessment & Plan Note (Signed)
Recent infection and took omnicef and prednisone which helped some. Cough improving and lungs clear. Concern for thrush as she recently took prednisone and is on breo. Rx nystatin 5 mL TID for 5 days to clear. Ear lavage done and hearing improved immediately and tolerated well without complications. No further antibiotics or steroids needed.

## 2022-10-25 NOTE — Patient Instructions (Signed)
We have sent in the nystatin to take 5 mL 3 times a day for 5 days.

## 2022-10-25 NOTE — Progress Notes (Signed)
   Subjective:   Patient ID: Jo Morrison, female    DOB: 26-Nov-1945, 76 y.o.   MRN: [id number]  Sinus Problem Associated symptoms include coughing, ear pain and a sore throat. Pertinent negatives include no shortness of breath.   The patient is a 76 YO female coming in for hearing loss and sinus symptoms. Finished antibiotics and steroids and still sore throat. Hx esophageal cancer making her want to get checked out.  Review of Systems  Constitutional: Negative.   HENT:  Positive for ear pain, hearing loss and sore throat.   Eyes: Negative.   Respiratory:  Positive for cough. Negative for chest tightness and shortness of breath.   Cardiovascular:  Negative for chest pain, palpitations and leg swelling.  Gastrointestinal:  Negative for abdominal distention, abdominal pain, constipation, diarrhea, nausea and vomiting.  Musculoskeletal: Negative.   Skin: Negative.   Neurological: Negative.   Psychiatric/Behavioral: Negative.      Objective:  Physical Exam Constitutional:      Appearance: She is well-developed.  HENT:     Head: Normocephalic and atraumatic.     Right Ear: There is impacted cerumen.     Left Ear: There is impacted cerumen.     Ears:     Comments:  both ears canal impacted with copious hard wax, examination post ear lavage canal is clear and no bleeding or complications noted.      Mouth/Throat:     Comments: Tongue with dark plaque stable from prior, redness of the oropharynx Cardiovascular:     Rate and Rhythm: Normal rate and regular rhythm.  Pulmonary:     Effort: Pulmonary effort is normal. No respiratory distress.     Breath sounds: Normal breath sounds. No wheezing or rales.  Abdominal:     General: Bowel sounds are normal. There is no distension.     Palpations: Abdomen is soft.     Tenderness: There is no abdominal tenderness. There is no rebound.  Musculoskeletal:     Cervical back: Normal range of motion.  Skin:    General: Skin is warm and dry.   Neurological:     Mental Status: She is alert and oriented to person, place, and time.     Coordination: Coordination normal.     Vitals:   10/25/22 0910  BP: 120/80  Pulse: (!) 112  Temp: 98.3 F (36.8 C)  TempSrc: Oral  SpO2: 99%  Weight: 111 lb 9.6 oz (50.6 kg)  Height: '5\' 3"'$  (1.6 m)    Assessment & Plan:

## 2022-10-25 NOTE — Telephone Encounter (Signed)
PRE-PROCEDURE EXAM: Left TM cannot be visualized due to total occlusion/impaction of the ear canal.  PROCEDURE INDICATION: remove wax to visualize ear drum & relieve discomfort  CONSENT:  Verbal     PROCEDURE NOTE:     R/L EAR:  I used warm water irrigation under direct visualization with the otoscope to free the wax bolus from the ear canal.    POST- PROCEDURE EXAM: TMs successfully visualized and found to have no erythema     The patient tolerated the procedure well.

## 2022-11-05 ENCOUNTER — Encounter: Payer: Self-pay | Admitting: Internal Medicine

## 2022-11-07 MED ORDER — FLUCONAZOLE 150 MG PO TABS: 150.0000 mg | ORAL_TABLET | Freq: Every day | ORAL | 0 refills | Status: DC

## 2022-11-07 MED ORDER — LIDOCAINE VISCOUS HCL 2 % MT SOLN: 15.0000 mL | Freq: Four times a day (QID) | OROMUCOSAL | 0 refills | Status: DC | PRN

## 2022-12-02 DIAGNOSIS — Z08 Encounter for follow-up examination after completed treatment for malignant neoplasm: Secondary | ICD-10-CM | POA: Diagnosis not present

## 2022-12-02 DIAGNOSIS — Z8501 Personal history of malignant neoplasm of esophagus: Secondary | ICD-10-CM | POA: Diagnosis not present

## 2022-12-02 DIAGNOSIS — R059 Cough, unspecified: Secondary | ICD-10-CM | POA: Diagnosis not present

## 2022-12-02 DIAGNOSIS — Z923 Personal history of irradiation: Secondary | ICD-10-CM | POA: Diagnosis not present

## 2022-12-02 DIAGNOSIS — Z9221 Personal history of antineoplastic chemotherapy: Secondary | ICD-10-CM | POA: Diagnosis not present

## 2022-12-02 DIAGNOSIS — C154 Malignant neoplasm of middle third of esophagus: Secondary | ICD-10-CM | POA: Diagnosis not present

## 2022-12-03 DIAGNOSIS — J4520 Mild intermittent asthma, uncomplicated: Secondary | ICD-10-CM | POA: Diagnosis not present

## 2022-12-03 DIAGNOSIS — Z853 Personal history of malignant neoplasm of breast: Secondary | ICD-10-CM | POA: Diagnosis not present

## 2022-12-03 DIAGNOSIS — C154 Malignant neoplasm of middle third of esophagus: Secondary | ICD-10-CM | POA: Diagnosis not present

## 2022-12-03 DIAGNOSIS — I10 Essential (primary) hypertension: Secondary | ICD-10-CM | POA: Diagnosis not present

## 2022-12-03 DIAGNOSIS — Z01818 Encounter for other preprocedural examination: Secondary | ICD-10-CM | POA: Diagnosis not present

## 2022-12-03 DIAGNOSIS — Z86711 Personal history of pulmonary embolism: Secondary | ICD-10-CM | POA: Diagnosis not present

## 2022-12-17 DIAGNOSIS — F32A Depression, unspecified: Secondary | ICD-10-CM | POA: Diagnosis not present

## 2022-12-17 DIAGNOSIS — I10 Essential (primary) hypertension: Secondary | ICD-10-CM | POA: Diagnosis not present

## 2022-12-17 DIAGNOSIS — Z853 Personal history of malignant neoplasm of breast: Secondary | ICD-10-CM | POA: Diagnosis not present

## 2022-12-17 DIAGNOSIS — K222 Esophageal obstruction: Secondary | ICD-10-CM | POA: Diagnosis not present

## 2022-12-17 DIAGNOSIS — C153 Malignant neoplasm of upper third of esophagus: Secondary | ICD-10-CM | POA: Diagnosis not present

## 2022-12-17 DIAGNOSIS — R918 Other nonspecific abnormal finding of lung field: Secondary | ICD-10-CM | POA: Diagnosis not present

## 2022-12-17 DIAGNOSIS — K6389 Other specified diseases of intestine: Secondary | ICD-10-CM | POA: Diagnosis not present

## 2022-12-17 DIAGNOSIS — C160 Malignant neoplasm of cardia: Secondary | ICD-10-CM | POA: Diagnosis not present

## 2022-12-17 DIAGNOSIS — Z86711 Personal history of pulmonary embolism: Secondary | ICD-10-CM | POA: Diagnosis not present

## 2022-12-17 DIAGNOSIS — J4520 Mild intermittent asthma, uncomplicated: Secondary | ICD-10-CM | POA: Diagnosis not present

## 2022-12-17 DIAGNOSIS — Z91040 Latex allergy status: Secondary | ICD-10-CM | POA: Diagnosis not present

## 2022-12-17 DIAGNOSIS — C154 Malignant neoplasm of middle third of esophagus: Secondary | ICD-10-CM | POA: Diagnosis not present

## 2022-12-17 DIAGNOSIS — Z9221 Personal history of antineoplastic chemotherapy: Secondary | ICD-10-CM | POA: Diagnosis not present

## 2022-12-17 DIAGNOSIS — F419 Anxiety disorder, unspecified: Secondary | ICD-10-CM | POA: Diagnosis not present

## 2022-12-17 DIAGNOSIS — Z923 Personal history of irradiation: Secondary | ICD-10-CM | POA: Diagnosis not present

## 2022-12-17 DIAGNOSIS — Z79899 Other long term (current) drug therapy: Secondary | ICD-10-CM | POA: Diagnosis not present

## 2022-12-17 DIAGNOSIS — R1900 Intra-abdominal and pelvic swelling, mass and lump, unspecified site: Secondary | ICD-10-CM | POA: Diagnosis not present

## 2022-12-17 DIAGNOSIS — C159 Malignant neoplasm of esophagus, unspecified: Secondary | ICD-10-CM | POA: Diagnosis not present

## 2022-12-18 ENCOUNTER — Other Ambulatory Visit: Payer: Self-pay | Admitting: Nurse Practitioner

## 2022-12-18 DIAGNOSIS — Z8501 Personal history of malignant neoplasm of esophagus: Secondary | ICD-10-CM

## 2022-12-18 DIAGNOSIS — K2289 Other specified disease of esophagus: Secondary | ICD-10-CM

## 2022-12-20 ENCOUNTER — Encounter: Payer: Self-pay | Admitting: Physician Assistant

## 2022-12-20 ENCOUNTER — Ambulatory Visit
Admission: RE | Admit: 2022-12-20 | Discharge: 2022-12-20 | Disposition: A | Discharge status: Home / Self Care | Payer: Self-pay | Source: Ambulatory Visit | Attending: Nurse Practitioner | Admitting: Nurse Practitioner

## 2022-12-20 ENCOUNTER — Inpatient Hospital Stay: Payer: PPO | Attending: Oncology | Admitting: Nurse Practitioner

## 2022-12-20 VITALS — BP 118/89 | HR 100 | Temp 97.8°F | Resp 18 | Ht 63.0 in | Wt 104.8 lb

## 2022-12-20 DIAGNOSIS — Z853 Personal history of malignant neoplasm of breast: Secondary | ICD-10-CM | POA: Insufficient documentation

## 2022-12-20 DIAGNOSIS — Z923 Personal history of irradiation: Secondary | ICD-10-CM | POA: Diagnosis not present

## 2022-12-20 DIAGNOSIS — Z86711 Personal history of pulmonary embolism: Secondary | ICD-10-CM | POA: Diagnosis not present

## 2022-12-20 DIAGNOSIS — Z8501 Personal history of malignant neoplasm of esophagus: Secondary | ICD-10-CM

## 2022-12-20 DIAGNOSIS — Z79891 Long term (current) use of opiate analgesic: Secondary | ICD-10-CM | POA: Insufficient documentation

## 2022-12-20 DIAGNOSIS — C158 Malignant neoplasm of overlapping sites of esophagus: Secondary | ICD-10-CM | POA: Insufficient documentation

## 2022-12-20 DIAGNOSIS — R1310 Dysphagia, unspecified: Secondary | ICD-10-CM | POA: Insufficient documentation

## 2022-12-20 DIAGNOSIS — R070 Pain in throat: Secondary | ICD-10-CM | POA: Diagnosis not present

## 2022-12-20 MED ORDER — HYDROCODONE-ACETAMINOPHEN 7.5-325 MG/15ML PO SOLN: 10.0000 mL | Freq: Four times a day (QID) | ORAL | 0 refills | Status: DC | PRN

## 2022-12-20 NOTE — Progress Notes (Unsigned)
Jo Morrison   Diagnosis: Esophagus cancer  INTERVAL HISTORY:   Jo Morrison returns prior to scheduled follow-up.  She developed progressive dysphagia.  She underwent an EGD by Dr. Williemae Natter at [facility] on 12/17/2022.  UES with near complete obstruction, able to be traversed with a wire and then subsequently dilated and allowed a pediatric scope.  The area of narrowing was at 14 cm from the teeth.  The area was biopsied.  Examination of the remainder of the esophagus and stomach was without abnormality.  Mid esophagus was not able to be biopsied due to inability to pass a regular scope.  Pathology is pending.  PET scan on 12/18/2022 showed FDG avidity in the cervical esophagus.  No FDG avid metastatic disease.  She reports continued dysphagia and odynophagia.  She is tolerating liquids, some soft solids.  Appetite is okay.  She estimates losing about 5 pounds recently.  Bowels are moving.  No nausea or vomiting.  We reviewed her medication list.  She is having difficulty swallowing trazodone even if she breaks it in half.  Objective:  Vital signs in last 24 hours:  Blood pressure 118/89, pulse 100, [name], temperature source Oral, resp. rate 18, height _0  (1.6 m), weight 104 lb 12.8 oz (47.5 kg), SpO2 100 %.    HEENT: No thrush or ulcers. Lymphatics: No palpable cervical, supraclavicular, axillary or inguinal lymph nodes. Resp: Lungs clear bilaterally. Cardio: Regular rate and rhythm. GI: No hepatosplenomegaly. Vascular: No leg edema.   Lab Results:  Lab Results  Component Value Date   WBC 3.1 (L) 03/13/2020   HGB 13.2 03/13/2020   HCT 38.9 03/13/2020   MCV 95.1 03/13/2020   PLT 193 03/13/2020   NEUTROABS 1.9 03/13/2020    Imaging:  No results found.  Medications: I have reviewed the patient's current medications.  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  For hypermetabolic metastatic lymph nodes, 3 in the mediastinum and 1 in the upper abdomen gastrohepatic ligament.  EUS 02/10/2020-ulcerated mass in the mid esophagus,uT3, 3 suspicious lymph nodes near the primary tumor, malignant appearing lymph node at the gastrohepatic ligament (level 18)-metastatic squamous cell carcinoma Radiation 01/31/2020 Cycle 1 weekly Taxol/carboplatin 02/01/2020 Cycle 2 weekly Taxol/carboplatin 02/08/2020 Cycle 3 weekly Taxol/carboplatin 02/15/2020 Cycle 4 weekly Taxol/carboplatin 02/22/2020 Cycle 5 weekly Taxol/carboplatin 02/29/2020 Radiation completed 03/08/2020 CTs 04/03/2020-esophageal mass appears grossly smaller, regression of previously noted lymphadenopathy in the mediastinum and upper abdomen, nonocclusive pulmonary embolus right-sided pulmonary arteries Upper endoscopy 05/02/2020-treatment effect visible starting approximately 26 cm from the incisors.  Scope passed easily into the stomach and proximal duodenum.  Stomach and proximal duodenum grossly normal.  Scope withdrawn to the mid esophagus with four-quadrant biopsies obtained.  2 biopsy showed acute ulcerative esophagitis; 2 biopsies showed squamous mucosa with minimal histologic abnormality. CTs 07/24/2020-no evidence of metastatic disease, left gastric lymph node has decreased in size, mild wall thickening of the mid to distal esophagus Upper endoscopy 07/25/2020-multiple biopsies with changes of ulcerative esophagitis, negative fungal, HSV, and CMV stains, no evidence of malignancy CTs at  [facility] 11/27/2020-interval decrease in prior mediastinal  groundglass opacities; decrease in now mild diffuse esophageal wall thickening, most prominent in the mid and distal esophagus; few scattered 3 mm and smaller noncalcified pulmonary nodules not significantly changed.  No definite new or enlarging pulmonary nodule.  No evidence of metastatic disease in the abdomen. EGD 11/28/2020-mild and discrete stricture in the distal esophagus; no mass lesions seen.  Biopsies taken, negative for malignancy x4; serial dilatation performed. CTs at [facility] 08/06/2021-no evidence of recurrent disease EGD 08/07/2021-no mass, mild stricture, multiple biopsies negative for carcinoma EGD at [facility] 12/25/2021-mild stricture, no mass, biopsies negative for carcinoma, 1 biopsy with possible low-grade dysplasia CTs 12/24/2021-negative for recurrent disease CTs at [facility] 07/01/2022-no evidence of recurrent disease Endoscopy 07/02/2022-negative for malignancy Progressive dysphagia/odynophagia status post EGD 12/17/2022-near complete obstruction UES, biopsy result pending PET scan at [facility] 12/18/2022-FDG avidity in the cervical esophagus.  No FDG avid metastatic disease. Right breast cancer July 1996, [name], [name], right lumpectomy and axillary dissection followed by adjuvant Adriamycin/Taxol chemotherapy-4 cycles, 5 years of tamoxifen, and breast radiation   3.   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  Depression   5.  Hypertension   6.  Right eye uveitis, macular edema, and corneal edema   7.  Asthma   8.  Pulmonary embolus on CT 04/03/2020-lovenox initiated, anticoagulation changed to apixaban beginning 08/03/2020, anticoagulation completed approximately the beginning of December 2021   9.  Odynophagia secondary to radiation -resolved  Disposition: Jo Morrison appears stable.  She recently developed progressive  dysphagia/odynophagia.  She underwent an EGD earlier this week with near complete obstruction of the UES.  Biopsy result is pending.  PET scan shows no evidence of metastatic disease.  She is tolerating liquids and most soft solids.  She will begin nutritional supplements.  She is having difficulty swallowing trazodone which she takes for sleep.  We have messaged Donnal Moat, [place] for assistance.  For the odynophagia a prescription was sent to her pharmacy for hydrocodone elixir.  Her case will be presented at the upcoming GI tumor conference.  She will return for a follow-up visit 12/25/2022.  She will contact the office in the interim with any problems.  Patient seen with Dr. Benay Spice.    Ned Card ANP/GNP-BC   12/20/2022  8:20 AM  This was a shared visit with Ned Card. Jo Morrison was interviewed and examined.  I discussed the case with Dr. Williemae Natter. Imaging and EGD reports reviewed and reviewed.  There is clinical and x-ray evidence of an upper esophagus malignancy.  Pathology from the EGD biopsy 12/17/2022 is pending.  The esophagus mass may be a new primary tumor versus a local recurrence of the previous esophagus cancer.  We discussed treatment options with Jo Morrison including surgery, systemic therapy, and repeat treatment with chemotherapy/radiation.  I contacted Dr. Lisbeth Renshaw.  He will review the previous radiation plan to see whether she is a candidate for additional esophagus radiation.  She is likely not a surgical candidate based on the tumor location.  I will present her case at the GI tumor conference on 12/25/2022.  She will return for an office visit and further discussion on 12/25/2022.  I was present for greater than 50% of today's visit.  I performed medical decision making.  Julieanne Manson, MD

## 2022-12-23 ENCOUNTER — Encounter: Payer: Self-pay | Admitting: Oncology

## 2022-12-25 ENCOUNTER — Inpatient Hospital Stay: Payer: PPO | Admitting: Oncology

## 2022-12-25 ENCOUNTER — Other Ambulatory Visit: Payer: Self-pay

## 2022-12-25 ENCOUNTER — Other Ambulatory Visit: Payer: Self-pay | Admitting: *Deleted

## 2022-12-25 VITALS — BP 112/76 | HR 113 | Temp 97.9°F | Resp 18 | Ht 63.0 in | Wt 103.6 lb

## 2022-12-25 DIAGNOSIS — C158 Malignant neoplasm of overlapping sites of esophagus: Secondary | ICD-10-CM

## 2022-12-25 DIAGNOSIS — Z8501 Personal history of malignant neoplasm of esophagus: Secondary | ICD-10-CM | POA: Diagnosis not present

## 2022-12-25 DIAGNOSIS — K2289 Other specified disease of esophagus: Secondary | ICD-10-CM

## 2022-12-25 MED ORDER — HYDROCODONE-ACETAMINOPHEN 7.5-325 MG/15ML PO SOLN: 10.0000 mL | Freq: Four times a day (QID) | ORAL | 0 refills | Status: DC | PRN

## 2022-12-25 MED ORDER — PROCHLORPERAZINE MALEATE 10 MG PO TABS: 10.0000 mg | ORAL_TABLET | Freq: Four times a day (QID) | ORAL | 1 refills | Status: DC | PRN

## 2022-12-25 MED ORDER — ONDANSETRON 8 MG PO TBDP: 8.0000 mg | ORAL_TABLET | Freq: Three times a day (TID) | ORAL | 1 refills | Status: DC | PRN

## 2022-12-25 MED ORDER — DEXAMETHASONE 2 MG PO TABS: 2.0000 mg | ORAL_TABLET | ORAL | 0 refills | Status: DC

## 2022-12-25 NOTE — Progress Notes (Signed)
Radiation Oncology         [phone] ________________________________  Name: Jo Morrison        MRN: [id number]  Date of Service: 12/26/2022 DOB: 25-Nov-1946  SH:FWYOVZCH, Real Cons, MD  Ladell Pier, MD     REFERRING PHYSICIAN: Ladell Pier, MD   DIAGNOSIS: The primary encounter diagnosis was Malignant neoplasm of lower-outer quadrant of right breast of female, estrogen receptor positive ([place]). 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   HISTORY OF PRESENT ILLNESS: Jo Morrison is a 77 y.o. female  with a history of squamous cell carcinoma of the proximal and mid esophagus. She has a history of right invasive ER positive ductal breast cancer treated with lumpectomy, adjuvant chemotherapy and adjuvant radiotherapy and antiestrogen therapy in 1996, and a left ER positive DCIS that was treated with lumpectomy, radiotherapy, and antiestogen therapy. She was diagnosed by EGD on 01/14/20 with a large ulcerating mass at the proximal esophagus and extending to the mid esophagus. The lesion was partially obstructing and partially circumferential involving 2/3 of the lumen circumference. Biopsies showed an invasive moderately differentiated squamous cell carcinoma. She received chemoRT which she completed in April 2021.  She's been followed in surveillance without evidence of disease.  She had CTs and endoscopy in August 2023 that were reassuring.  The patient developed dysphagia and a dyne aphasia and an EGD on 12/17/2022 showed near complete obstruction in the proximal esophagus.  A biopsy showed moderately differentiated squamous cell carcinoma.  She underwent a PET scan on 12/18/2022 that showed hypermetabolic activity within the cervical esophagus but no evidence of metastatic disease.  Her case has been discussed in multidisciplinary GI oncology conference and fortunately the area is smaller and above the previous  disease she has been treated for.  While there is some concern for overlap of the lower dose of her prior radiation fields, she is seen to discuss definitive chemoradiation.   PREVIOUS RADIATION THERAPY:   01/31/20-03/08/20: The esophagus target was treated to 50 Gy in 25 fractions followed by a 6 Gy Boost.   01/22/10-02/26/10: The left breast was treated over 4 weeks at [facility] in [facility], details are otherwise unknown.   1996: The right breast was treated over 6 1/2 weeks at [facility] in [facility], details are otherwise unknown.     PAST MEDICAL HISTORY:  Past Medical History:  Diagnosis Date   Allergy    Anxiety    Asthma    Breast cancer ([place]) 2012   left   Breast cancer, right ([place]) 1996   right   Cataract    cataracts removed   Chronic uveitis of right eye    Colon polyps    Corneal edema of right eye    Cystoid macular edema of right eye    Depressive disorder    DVT (deep venous thrombosis) ([facility])    Family history of colon cancer    Glaucoma    not on eye drops at this time.   Hypertension    Hypertensive retinopathy of both eyes    Olecranon bursitis    Osteoarthritis    Other disorders of bone and cartilage(733.99)    Personal history of chemotherapy    1996   Personal history of radiation therapy    1996 & 2011   Pseudoexfoliation (PXF) glaucoma of both eyes    Pseudophakia, both eyes  Seasonal allergies    Unspecified pruritic disorder        PAST SURGICAL HISTORY: Past Surgical History:  Procedure Laterality Date   APPENDECTOMY  1958   BREAST LUMPECTOMY Right 1996   Dr. Corena Herter in [place]   BREAST LUMPECTOMY Left 2011   Dr. Pleas Patricia   cataract Bilateral    CATARACT EXTRACTION Bilateral    Dr. Lucita Ferrara   COLONOSCOPY  2011-last   DUPUYTREN CONTRACTURE RELEASE     ESOPHAGOGASTRODUODENOSCOPY (EGD) WITH PROPOFOL N/A 02/10/2020   Procedure: ESOPHAGOGASTRODUODENOSCOPY (EGD) WITH PROPOFOL;  Surgeon:  Milus Banister, MD;  Location: [facility] END[facility]COPY;  Service: Endoscopy;  Laterality: N/A;   FINE NEEDLE ASPIRATION N/A 02/10/2020   Procedure: FINE NEEDLE ASPIRATION (FNA) LINEAR;  Surgeon: Milus Banister, MD;  Location: [facility]OSCOPY;  Service: Endoscopy;  Laterality: N/A;   HAND SURGERY Right 2020   LASIK Bilateral 2007   POLYPECTOMY     ROTATOR CUFF REPAIR  2007   TONSILLECTOMY  1967   UPPER ESOPHAGEAL ENDOSCOPIC ULTRASOUND (EUS) N/A 02/10/2020   Procedure: UPPER ESOPHAGEAL ENDOSCOPIC ULTRASOUND (EUS);  Surgeon: Milus Banister, MD;  Location: [facility];  Service: Endoscopy;  Laterality: N/A;     FAMILY HISTORY:  Family History  Problem Relation Age of Onset   Stroke Father 37   Heart disease Brother    Colon cancer Maternal Aunt 70   Macular degeneration Mother    Cancer Maternal Grandmother        unknown type   Colon polyps Neg Hx    Esophageal cancer Neg Hx    Rectal cancer Neg Hx    Stomach cancer Neg Hx      SOCIAL HISTORY:  reports that she has never smoked. She has never used smokeless tobacco. She reports current alcohol use of about 6.0 standard drinks of alcohol per week. She reports that she does not use drugs. The patient is married and lives in [place]. She previously lived in [place], [place]. She is retired but worked as a Garment/textile technologist.    ALLERGIES: Latex   MEDICATIONS:  Current Outpatient Medications  Medication Sig Dispense Refill   clonazePAM (KLONOPIN) 0.5 MG tablet Take 0.5-1 tablets (0.25-0.5 mg total) by mouth 2 (two) times daily as needed. 30 tablet 1   dorzolamide-timolol (COSOPT) 22.3-6.8 MG/ML ophthalmic solution Place 1 drop into the right eye 2 (two) times daily.      fluticasone (FLONASE) 50 MCG/ACT nasal spray Place 2 sprays into the nose daily as needed for allergies.      fluticasone furoate-vilanterol (BREO ELLIPTA) 200-25 MCG/ACT AEPB Inhale 1 puff into the lungs daily. 3 each 3   hydrochlorothiazide (HYDRODIURIL) 25 MG tablet TAKE 1  TABLET(25 MG) BY MOUTH DAILY 90 tablet 3   HYDROcodone-acetaminophen (HYCET) 7.5-325 mg/15 ml solution Take 10 mLs by mouth 4 (four) times daily as needed for moderate pain. 120 mL 0   loteprednol (LOTEMAX) 0.5 % ophthalmic suspension Place 1 drop into the right eye 4 (four) times daily.     nystatin (MYCOSTATIN) 100000 UNIT/ML suspension Take 5 mLs (500,000 Units total) by mouth in the morning, at noon, and at bedtime. 60 mL 0   REFRESH OPTIVE ADVANCED 0.5-1-0.5 % SOLN Place 1 drop into both eyes daily as needed (itching Eyes).      traZODone (DESYREL) 100 MG tablet Take 1-2 tablets (100-200 mg total) by mouth at bedtime as needed for sleep. 180 tablet 3   venlafaxine XR (EFFEXOR-XR) 150 MG 24 hr capsule TAKE  1 CAPSULE(150 MG) BY MOUTH DAILY 90 capsule 3   No current facility-administered medications for this visit.     REVIEW OF SYSTEMS: On review of systems, the patient reports that she is having a hard time with her swallowing.  She is using Hycet which does seem to help somewhat but is struggling with taking medication by mouth that is in pill form.  She has had to start crushing her medication in order to take this.  I encouraged her to follow-up with her pharmacist about potentially converting her medications to liquid form.  She has lost about 5 to 6 pounds since November of last year.  No other complaints are verbalized.     PHYSICAL EXAM:  Wt Readings from Last 3 Encounters:  12/20/22 104 lb 12.8 oz (47.5 kg)  10/25/22 111 lb 9.6 oz (50.6 kg)  10/09/22 110 lb (49.9 kg)   Temp Readings from Last 3 Encounters:  12/20/22 97.8 F (36.6 C) (Oral)  10/25/22 98.3 F (36.8 C) (Oral)  10/09/22 99.4 F (37.4 C) (Oral)   BP Readings from Last 3 Encounters:  12/20/22 118/89  10/25/22 120/80  10/09/22 (!) 149/94   Pulse Readings from Last 3 Encounters:  12/20/22 100  10/25/22 (!) 112  10/09/22 (!) 103   In general this is a well appearing caucasian female in no acute distress.  She's alert and oriented x4 and appropriate throughout the examination. Cardiopulmonary assessment is negative for acute distress and she exhibits normal effort.      ECOG = 1  0 - Asymptomatic (Fully active, able to carry on all predisease activities without restriction)  1 - Symptomatic but completely ambulatory (Restricted in physically strenuous activity but ambulatory and able to carry out work of a light or sedentary nature. For example, light housework, office work)  2 - Symptomatic, <50% in bed during the day (Ambulatory and capable of all self care but unable to carry out any work activities. Up and about more than 50% of waking hours)  3 - Symptomatic, >50% in bed, but not bedbound (Capable of only limited self-care, confined to bed or chair 50% or more of waking hours)  4 - Bedbound (Completely disabled. Cannot carry on any self-care. Totally confined to bed or chair)  5 - Death   Eustace Pen MM, Creech RH, Tormey DC, et al. [phone]). "Toxicity and response criteria of the [facility]". [place] Oncol. 5 (6): 649-55    LABORATORY DATA:  Lab Results  Component Value Date   WBC 3.1 (L) 03/13/2020   HGB 13.2 03/13/2020   HCT 38.9 03/13/2020   MCV 95.1 03/13/2020   PLT 193 03/13/2020   Lab Results  Component Value Date   NA 141 02/21/2020   K 3.5 02/21/2020   CL 106 02/21/2020   CO2 28 02/21/2020   Lab Results  Component Value Date   ALT 12 02/21/2020   AST 16 02/21/2020   ALKPHOS 44 02/21/2020   BILITOT 0.8 02/21/2020      RADIOGRAPHY: No results found.      IMPRESSION/PLAN: 1. Locally Recurrent Squamous Cell Carcinoma of the involving the cervical esophagus and a history of overlapping squamous cell carcinoma involving the proximal and middle third esophagus. Dr. Lisbeth Renshaw discusses the pathology findings, her course to date, and the concerns about having prior treatment to the area just below her current area of disease. While the area is  distinct on PET and smaller than prior disease we've treated, there are  some rare complications related to treatment overlap. Dr. Lisbeth Renshaw still offers a definitive conformal radiation dose along with chemosensitization Dr. Benay Spice is planning. We discussed damage to the esophagus, trachea, nerves in the chest, and usual  short, and long term effects of radiotherapy, as well as the curative intent, and the patient is interested in proceeding. Written consent is obtained and placed in the chart, a copy was provided to the patient. She will simulate tomorrow and start treatment on 01/01/23. 2. Remote history of bilateral breast cancers. She will continue to follow up with Dr. Lindi Adie in medical oncology as well.   This encounter was provided by telemedicine platform MyChart.  The patient has provided two factor identification and has given verbal consent for this type of encounter and has been advised to only accept a meeting of this type in a secure network environment. The time spent during this encounter was 60 minutes including preparation, discussion, and coordination of the patient's care. The attendants for this meeting include Blenda Nicely, RN, Dr. Lisbeth Renshaw, Hayden Pedro  and Chical  During the encounter,  Blenda Nicely, RN, Dr. Lisbeth Renshaw, and Hayden Pedro were located at [facility] Department.  Jo Morrison was located at home.    The above documentation reflects my direct findings during this shared patient visit. Please see the separate note by Dr. Lisbeth Renshaw on this date for the remainder of the patient's plan of care.    Carola Rhine, PAC

## 2022-12-25 NOTE — Progress Notes (Signed)
, °  The proposed treatment discussed in conference is for discussion purpose only and is not a binding recommendation.  The patients have not been physically examined, or presented with their treatment options.  Therefore, final treatment plans cannot be decided.  

## 2022-12-25 NOTE — Progress Notes (Signed)
Provided patient printed information on Taxol/Carbo (she had same regimen in 2021). Informed her that steroid will be taking night prior and day of 1st treatment. She does not want a port due to blood clot in past. Only able to use left arm due to breast surgery. Left arm venous access appears adequate for treatment. She knows to push fluids day prior to help with hydration.

## 2022-12-25 NOTE — Progress Notes (Signed)
ON PATHWAY REGIMEN - Gastroesophageal  No Change  Continue With Treatment as Ordered.  Original Decision Date/Time: 01/20/2020 17:58     Administer weekly during RT:     Paclitaxel      Carboplatin   **Always confirm dose/schedule in your pharmacy ordering system**  Patient Characteristics: Esophageal & GE Junction, Squamous Cell, Preoperative or Nonsurgical Candidate (Clinical Staging), cT2 or Higher or cN+, Surgical Candidate (Up to cT4a) - Preoperative Therapy Histology: Squamous Cell Disease Classification: Esophageal Therapeutic Status: Preoperative or Nonsurgical Candidate (Clinical Staging) AJCC M Category: Staged < 8th Ed. AJCC 8 Stage Grouping: Staged < 8th Ed. AJCC Grade: Staged < 8th Ed. AJCC Location: Staged < 8th Ed. AJCC T Category: Staged < 8th Ed. AJCC N Category: Staged < 8th Ed. Intent of Therapy: Curative Intent, Discussed with Patient

## 2022-12-25 NOTE — Progress Notes (Signed)
[facility] OFFICE PROGRESS NOTE   Diagnosis: Esophagus cancer  INTERVAL HISTORY:   Jo Morrison returns as scheduled.  She is tolerating liquids.  She is using nutrition supplements.  Hydrocodone helps the throat pain.  No new complaint.  Objective:  Vital signs in last 24 hours:  Blood pressure 112/76, pulse (!) 113, temperature 97.9 F (36.6 C), temperature source Oral, resp. rate 18, height _0  (1.6 m), weight 103 lb 9.6 oz (47 kg), SpO2 100 %.    Lymphatics: No cervical, supraclavicular, or axillary nodes Resp: Lungs clear bilaterally Cardio: Regular rate and rhythm tachycardia GI: No hepatosplenomegaly Vascular: No leg edema     Lab Results:  Lab Results  Component Value Date   WBC 3.1 (L) 03/13/2020   HGB 13.2 03/13/2020   HCT 38.9 03/13/2020   MCV 95.1 03/13/2020   PLT 193 03/13/2020   NEUTROABS 1.9 03/13/2020    CMP  Lab Results  Component Value Date   NA 141 02/21/2020   K 3.5 02/21/2020   CL 106 02/21/2020   CO2 28 02/21/2020   GLUCOSE 146 (H) 02/21/2020   BUN 10 02/21/2020   CREATININE 0.71 02/21/2020   CALCIUM 8.9 02/21/2020   PROT 6.4 (L) 02/21/2020   ALBUMIN 3.4 (L) 02/21/2020   AST 16 02/21/2020   ALT 12 02/21/2020   ALKPHOS 44 02/21/2020   BILITOT 0.8 02/21/2020   GFRNONAA >60 02/21/2020   GFRAA >60 02/21/2020    No results found for: "CEA1", "CEA", "CZY606", "CA125"  No results found for: "INR", "LABPROT"  Imaging:  No results found.  Medications: I have reviewed the patient's current medications.   Assessment/Plan: Squamous cell carcinoma of the upper/middle third of the esophagus Upper endoscopy 01/14/2020-ulcerated mass in the proximal/mid esophagus at 26-31 cm from the incisors, biopsy confirmed invasive moderately differentiated squamous cell carcinoma, PD-L1 combined positive score 0% CT abdomen/pelvis 12/09/2019-uterine fibroids CT chest 12/30/2019-normal esophagus, new sclerosis of the medial head of the  right clavicle likely degenerative PET scan 01/24/2020-long segment of hypermetabolic thickening of the distal esophagus.  For hypermetabolic metastatic lymph nodes, 3 in the mediastinum and 1 in the upper abdomen gastrohepatic ligament.  EUS 02/10/2020-ulcerated mass in the mid esophagus,uT3, 3 suspicious lymph nodes near the primary tumor, malignant appearing lymph node at the gastrohepatic ligament (level 18)-metastatic squamous cell carcinoma Radiation 01/31/2020 Cycle 1 weekly Taxol/carboplatin 02/01/2020 Cycle 2 weekly Taxol/carboplatin 02/08/2020 Cycle 3 weekly Taxol/carboplatin 02/15/2020 Cycle 4 weekly Taxol/carboplatin 02/22/2020 Cycle 5 weekly Taxol/carboplatin 02/29/2020 Radiation completed 03/08/2020 CTs 04/03/2020-esophageal mass appears grossly smaller, regression of previously noted lymphadenopathy in the mediastinum and upper abdomen, nonocclusive pulmonary embolus right-sided pulmonary arteries Upper endoscopy 05/02/2020-treatment effect visible starting approximately 26 cm from the incisors.  Scope passed easily into the stomach and proximal duodenum.  Stomach and proximal duodenum grossly normal.  Scope withdrawn to the mid esophagus with four-quadrant biopsies obtained.  2 biopsy showed acute ulcerative esophagitis; 2 biopsies showed squamous mucosa with minimal histologic abnormality. CTs 07/24/2020-no evidence of metastatic disease, left gastric lymph node has decreased in size, mild wall thickening of the mid to distal esophagus Upper endoscopy 07/25/2020-multiple biopsies with changes of ulcerative esophagitis, negative fungal, HSV, and CMV stains, no evidence of malignancy CTs at [facility] 11/27/2020-interval decrease in prior mediastinal groundglass opacities; decrease in now mild diffuse esophageal wall thickening, most prominent in the mid and distal esophagus; few scattered 3 mm and smaller noncalcified pulmonary nodules not significantly changed.  No definite new or enlarging pulmonary nodule.  No evidence of metastatic disease in the abdomen. EGD 11/28/2020-mild and discrete stricture in the distal esophagus; no mass lesions seen.  Biopsies taken, negative for malignancy x4; serial dilatation performed. CTs at [facility] 08/06/2021-no evidence of recurrent disease EGD 08/07/2021-no mass, mild stricture, multiple biopsies negative for carcinoma EGD at [facility] 12/25/2021-mild stricture, no mass, biopsies negative for carcinoma, 1 biopsy with possible low-grade dysplasia CTs 12/24/2021-negative for recurrent disease CTs at [facility] 07/01/2022-no evidence of recurrent disease Endoscopy 07/02/2022-negative for malignancy Progressive dysphagia/odynophagia status post EGD 12/17/2022-near complete obstruction UES, biopsy-moderately differentiated squamous cell carcinoma  PET scan at [facility] 12/18/2022-FDG avidity in the cervical esophagus.  No FDG avid metastatic disease. Right breast cancer July 1996, [name], [name], right lumpectomy and axillary dissection followed by adjuvant Adriamycin/Taxol chemotherapy-4 cycles, 5 years of tamoxifen, and breast radiation   3.   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  Depression   5.  Hypertension   6.  Right eye uveitis, macular edema, and corneal edema   7.  Asthma   8.  Pulmonary embolus on CT 04/03/2020-lovenox initiated, anticoagulation changed to apixaban beginning 08/03/2020, anticoagulation completed approximately the beginning of December 2021   9.  Odynophagia secondary to radiation -resolved    Disposition: Jo Morrison appears unchanged.  She continues to have dysphagia secondary to the obstructing upper esophageal tumor.  The pathology returned positive for moderately differentiated invasive squamous cell carcinoma.  Her case was presented at the GI tumor conference earlier today.  The consensus recommendation is to proceed with concurrent chemotherapy  and radiation.  She is not a candidate for surgical resection.  We are waiting on results from PD-L1 testing on the deep biopsy.  Dr. Lisbeth Renshaw feels she can receive another course of radiation due to the distance of the current mass from the 2021 tumor.  The plan is to repeat treatment with weekly Taxol/carboplatin and radiation.  The systemic therapy plan may be adjusted based on the PD-1 testing and recommendations from Dr. Fanny Skates.  We reviewed potential toxicities associated with the weekly Taxol/carboplatin regimen including the chance of nausea/vomiting, alopecia, hematologic toxicity, infection, and bleeding.  We discussed the neuropathy and allergic reaction seen with Taxol.  We discussed the allergic reaction that can occur with a second course of carboplatin.  She agrees to proceed.  She is scheduled to see Dr. Lisbeth Renshaw tomorrow.  We will plan to begin concurrent chemotherapy and radiation on 01/06/2023.  I refilled her prescription for hydrocodone elixir.  A chemotherapy plan was entered today.  We will consider prophylactic anticoagulation during the course of treatment given the history of a pulmonary embolism following treatment in 2021.  Betsy Coder, MD  12/25/2022  1:43 PM

## 2022-12-26 ENCOUNTER — Encounter: Payer: Self-pay | Admitting: Radiation Oncology

## 2022-12-26 ENCOUNTER — Other Ambulatory Visit: Payer: Self-pay | Admitting: Oncology

## 2022-12-26 ENCOUNTER — Ambulatory Visit
Admission: RE | Admit: 2022-12-26 | Discharge: 2022-12-26 | Disposition: A | Discharge status: Home / Self Care | Payer: PPO | Source: Ambulatory Visit | Attending: Radiation Oncology | Admitting: Radiation Oncology

## 2022-12-26 ENCOUNTER — Other Ambulatory Visit: Payer: Self-pay

## 2022-12-26 ENCOUNTER — Encounter: Payer: Self-pay | Admitting: Nurse Practitioner

## 2022-12-26 VITALS — BP 117/79 | HR 50 | Temp 97.7°F | Resp 18 | Ht 63.0 in | Wt 104.0 lb

## 2022-12-26 DIAGNOSIS — Z86718 Personal history of other venous thrombosis and embolism: Secondary | ICD-10-CM | POA: Insufficient documentation

## 2022-12-26 DIAGNOSIS — Z170 Estrogen receptor positive status [ER+]: Secondary | ICD-10-CM | POA: Diagnosis not present

## 2022-12-26 DIAGNOSIS — C50511 Malignant neoplasm of lower-outer quadrant of right female breast: Secondary | ICD-10-CM

## 2022-12-26 DIAGNOSIS — Z9221 Personal history of antineoplastic chemotherapy: Secondary | ICD-10-CM | POA: Insufficient documentation

## 2022-12-26 DIAGNOSIS — Z800 Family history of malignant neoplasm of digestive organs: Secondary | ICD-10-CM | POA: Diagnosis not present

## 2022-12-26 DIAGNOSIS — Z923 Personal history of irradiation: Secondary | ICD-10-CM | POA: Insufficient documentation

## 2022-12-26 DIAGNOSIS — I10 Essential (primary) hypertension: Secondary | ICD-10-CM | POA: Diagnosis not present

## 2022-12-26 DIAGNOSIS — M1990 Unspecified osteoarthritis, unspecified site: Secondary | ICD-10-CM | POA: Insufficient documentation

## 2022-12-26 DIAGNOSIS — Z79899 Other long term (current) drug therapy: Secondary | ICD-10-CM | POA: Diagnosis not present

## 2022-12-26 DIAGNOSIS — C158 Malignant neoplasm of overlapping sites of esophagus: Secondary | ICD-10-CM | POA: Insufficient documentation

## 2022-12-26 MED ORDER — SUCRALFATE 1 G PO TABS: 1.0000 g | ORAL_TABLET | Freq: Three times a day (TID) | ORAL | 2 refills | Status: DC

## 2022-12-26 NOTE — Progress Notes (Addendum)
Reconsult nursing interview for Primary squamous cell carcinoma of overlapping sites of esophagus ([place]).  I verified patient's identity and began nursing interview. Patient reports dysphagia 4/10 (currently on a liquid diet), and fatigue. No other related issues reported at this time.  Meaningful use complete.  BP 117/79 (BP Location: Left Arm, Patient Position: Sitting, Cuff Size: Normal)   Pulse (!) 50   Temp 97.7 F (36.5 C) (Temporal)   Resp 18   Ht '5\' 3"'$  (1.6 m)   Wt 104 lb (47.2 kg)   SpO2 95%   BMI 18.42 kg/m   This concludes the interview.   Leandra Kern, LPN

## 2022-12-26 NOTE — Addendum Note (Signed)
Encounter addended by: Mollie Germany, LPN on: 07/02/5026 7:41 AM  Actions taken: Visit diagnoses modified

## 2022-12-27 ENCOUNTER — Ambulatory Visit
Admission: RE | Admit: 2022-12-27 | Discharge: 2022-12-27 | Disposition: A | Discharge status: Home / Self Care | Payer: PPO | Source: Ambulatory Visit | Attending: Radiation Oncology | Admitting: Radiation Oncology

## 2022-12-27 ENCOUNTER — Other Ambulatory Visit: Payer: Self-pay

## 2022-12-27 DIAGNOSIS — R1310 Dysphagia, unspecified: Secondary | ICD-10-CM | POA: Diagnosis not present

## 2022-12-27 DIAGNOSIS — R200 Anesthesia of skin: Secondary | ICD-10-CM | POA: Diagnosis not present

## 2022-12-27 DIAGNOSIS — Z5111 Encounter for antineoplastic chemotherapy: Secondary | ICD-10-CM | POA: Diagnosis not present

## 2022-12-27 DIAGNOSIS — Z79891 Long term (current) use of opiate analgesic: Secondary | ICD-10-CM | POA: Diagnosis not present

## 2022-12-27 DIAGNOSIS — Z86711 Personal history of pulmonary embolism: Secondary | ICD-10-CM | POA: Insufficient documentation

## 2022-12-27 DIAGNOSIS — Z510 Encounter for antineoplastic radiation therapy: Secondary | ICD-10-CM | POA: Insufficient documentation

## 2022-12-27 DIAGNOSIS — Z853 Personal history of malignant neoplasm of breast: Secondary | ICD-10-CM | POA: Diagnosis not present

## 2022-12-27 DIAGNOSIS — C158 Malignant neoplasm of overlapping sites of esophagus: Secondary | ICD-10-CM | POA: Insufficient documentation

## 2022-12-29 ENCOUNTER — Other Ambulatory Visit: Payer: Self-pay | Admitting: Oncology

## 2023-01-01 ENCOUNTER — Ambulatory Visit: Payer: PPO | Admitting: Radiation Oncology

## 2023-01-01 ENCOUNTER — Encounter: Payer: Self-pay | Admitting: Nurse Practitioner

## 2023-01-02 ENCOUNTER — Ambulatory Visit
Admission: RE | Admit: 2023-01-02 | Discharge: 2023-01-02 | Disposition: A | Discharge status: Home / Self Care | Payer: PPO | Source: Ambulatory Visit | Attending: Radiation Oncology | Admitting: Radiation Oncology

## 2023-01-02 ENCOUNTER — Other Ambulatory Visit: Payer: Self-pay

## 2023-01-02 ENCOUNTER — Encounter: Payer: Self-pay | Admitting: Nurse Practitioner

## 2023-01-02 ENCOUNTER — Ambulatory Visit: Payer: PPO

## 2023-01-02 ENCOUNTER — Inpatient Hospital Stay: Payer: PPO

## 2023-01-02 ENCOUNTER — Inpatient Hospital Stay: Payer: PPO | Admitting: Nurse Practitioner

## 2023-01-02 VITALS — BP 155/90 | HR 90 | Temp 98.1°F | Resp 18

## 2023-01-02 VITALS — BP 145/86 | HR 113 | Temp 98.1°F | Resp 18 | Ht 63.0 in | Wt 105.0 lb

## 2023-01-02 DIAGNOSIS — Z86711 Personal history of pulmonary embolism: Secondary | ICD-10-CM | POA: Insufficient documentation

## 2023-01-02 DIAGNOSIS — Z79891 Long term (current) use of opiate analgesic: Secondary | ICD-10-CM | POA: Insufficient documentation

## 2023-01-02 DIAGNOSIS — K5900 Constipation, unspecified: Secondary | ICD-10-CM | POA: Insufficient documentation

## 2023-01-02 DIAGNOSIS — C158 Malignant neoplasm of overlapping sites of esophagus: Secondary | ICD-10-CM

## 2023-01-02 DIAGNOSIS — Z923 Personal history of irradiation: Secondary | ICD-10-CM | POA: Insufficient documentation

## 2023-01-02 DIAGNOSIS — R1310 Dysphagia, unspecified: Secondary | ICD-10-CM | POA: Insufficient documentation

## 2023-01-02 DIAGNOSIS — Z853 Personal history of malignant neoplasm of breast: Secondary | ICD-10-CM | POA: Insufficient documentation

## 2023-01-02 DIAGNOSIS — Z5111 Encounter for antineoplastic chemotherapy: Secondary | ICD-10-CM | POA: Insufficient documentation

## 2023-01-02 DIAGNOSIS — R200 Anesthesia of skin: Secondary | ICD-10-CM | POA: Insufficient documentation

## 2023-01-02 LAB — CBC WITH DIFFERENTIAL (CANCER CENTER ONLY)
Abs Immature Granulocytes: 0.02 K/uL (ref 0.00–0.07)
Basophils Absolute: 0 K/uL (ref 0.0–0.1)
Basophils Relative: 0 %
Eosinophils Absolute: 0 K/uL (ref 0.0–0.5)
Eosinophils Relative: 0 %
HCT: 46.2 % — ABNORMAL HIGH (ref 36.0–46.0)
Hemoglobin: 15.1 g/dL — ABNORMAL HIGH (ref 12.0–15.0)
Immature Granulocytes: 0 %
Lymphocytes Relative: 11 %
Lymphs Abs: 0.8 K/uL (ref 0.7–4.0)
MCH: 30.4 pg (ref 26.0–34.0)
MCHC: 32.7 g/dL (ref 30.0–36.0)
MCV: 93 fL (ref 80.0–100.0)
Monocytes Absolute: 0.1 K/uL (ref 0.1–1.0)
Monocytes Relative: 2 %
Neutro Abs: 6.1 K/uL (ref 1.7–7.7)
Neutrophils Relative %: 87 %
Platelet Count: 369 K/uL (ref 150–400)
RBC: 4.97 MIL/uL (ref 3.87–5.11)
RDW: 13.2 % (ref 11.5–15.5)
WBC Count: 7 K/uL (ref 4.0–10.5)
nRBC: 0 % (ref 0.0–0.2)

## 2023-01-02 LAB — RAD ONC ARIA SESSION SUMMARY
Course Elapsed Days: 0
Plan Fractions Treated to Date: 1
Plan Prescribed Dose Per Fraction: 1.8 Gy
Plan Total Fractions Prescribed: 25
Plan Total Prescribed Dose: 45 Gy
Reference Point Dosage Given to Date: 1.8 Gy
Reference Point Session Dosage Given: 1.8 Gy
Session Number: 1

## 2023-01-02 LAB — CMP (CANCER CENTER ONLY)
ALT: 7 U/L (ref 0–44)
AST: 12 U/L — ABNORMAL LOW (ref 15–41)
Albumin: 4.6 g/dL (ref 3.5–5.0)
Alkaline Phosphatase: 58 U/L (ref 38–126)
Anion gap: 14 (ref 5–15)
BUN: 11 mg/dL (ref 8–23)
CO2: 26 mmol/L (ref 22–32)
Calcium: 10.1 mg/dL (ref 8.9–10.3)
Chloride: 98 mmol/L (ref 98–111)
Creatinine: 0.8 mg/dL (ref 0.44–1.00)
GFR, Estimated: >60 mL/min (ref >60)
Glucose, Bld: 198 mg/dL — ABNORMAL HIGH (ref 70–99)
Potassium: 3.2 mmol/L — ABNORMAL LOW (ref 3.5–5.1)
Sodium: 138 mmol/L (ref 135–145)
Total Bilirubin: 0.5 mg/dL (ref 0.3–1.2)
Total Protein: 8.1 g/dL (ref 6.5–8.1)

## 2023-01-02 MED ORDER — SODIUM CHLORIDE 0.9 % IV SOLN
121.0000 mg | Freq: Once | INTRAVENOUS | Status: AC
  Administered 2023-01-02: 120 mg via INTRAVENOUS
  Filled 2023-01-02: qty 12

## 2023-01-02 MED ORDER — PALONOSETRON HCL INJECTION 0.25 MG/5ML
0.2500 mg | Freq: Once | INTRAVENOUS | Status: AC
  Administered 2023-01-02: 0.25 mg via INTRAVENOUS
  Filled 2023-01-02: qty 5

## 2023-01-02 MED ORDER — DIPHENHYDRAMINE HCL 50 MG/ML IJ SOLN
50.0000 mg | Freq: Once | INTRAMUSCULAR | Status: AC
  Administered 2023-01-02: 50 mg via INTRAVENOUS
  Filled 2023-01-02: qty 1

## 2023-01-02 MED ORDER — FAMOTIDINE IN NACL 20-0.9 MG/50ML-% IV SOLN
20.0000 mg | Freq: Once | INTRAVENOUS | Status: AC
  Administered 2023-01-02: 20 mg via INTRAVENOUS
  Filled 2023-01-02: qty 50

## 2023-01-02 MED ORDER — POTASSIUM CHLORIDE 20 MEQ/15ML (10%) PO SOLN: 20.0000 meq | Freq: Every day | ORAL | 0 refills | Status: DC

## 2023-01-02 MED ORDER — SODIUM CHLORIDE 0.9 % IV SOLN
50.0000 mg/m2 | Freq: Once | INTRAVENOUS | Status: AC
  Administered 2023-01-02: 72 mg via INTRAVENOUS
  Filled 2023-01-02: qty 12

## 2023-01-02 MED ORDER — SODIUM CHLORIDE 0.9 % IV SOLN
10.0000 mg | Freq: Once | INTRAVENOUS | Status: AC
  Administered 2023-01-02: 10 mg via INTRAVENOUS
  Filled 2023-01-02: qty 10

## 2023-01-02 MED ORDER — SODIUM CHLORIDE 0.9 % IV SOLN: Freq: Once | INTRAVENOUS | Status: AC

## 2023-01-02 NOTE — Progress Notes (Signed)
Patient seen by Ned Card NP today  Vitals are not all within treatment parameters. HR is 113 repeat before treatment  Labs reviewed by Ned Card NP and are within treatment parameters.  Per physician team, patient is ready for treatment and there are NO modifications to the treatment plan.

## 2023-01-02 NOTE — Progress Notes (Signed)
Per Ned Card, NP ok to treat with HR 104.

## 2023-01-02 NOTE — Patient Instructions (Signed)
[facility]  Discharge Instructions: Thank you for choosing [facility] to provide your oncology and hematology care.   If you have a lab appointment with the [facility], please go directly to the [facility] and check in at the registration area.   Wear comfortable clothing and clothing appropriate for easy access to any Portacath or PICC line.   We strive to give you quality time with your provider. You may need to reschedule your appointment if you arrive late (15 or more minutes).  Arriving late affects you and other patients whose appointments are after yours.  Also, if you miss three or more appointments without notifying the office, you may be dismissed from the clinic at the provider's discretion.      For prescription refill requests, have your pharmacy contact our office and allow 72 hours for refills to be completed.    Today you received the following chemotherapy and/or immunotherapy agents: paclitaxel, carboplatin      To help prevent nausea and vomiting after your treatment, we encourage you to take your nausea medication as directed.  BELOW ARE SYMPTOMS THAT SHOULD BE REPORTED IMMEDIATELY: *FEVER GREATER THAN 100.4 F (38 C) OR HIGHER *CHILLS OR SWEATING *NAUSEA AND VOMITING THAT IS NOT CONTROLLED WITH YOUR NAUSEA MEDICATION *UNUSUAL SHORTNESS OF BREATH *UNUSUAL BRUISING OR BLEEDING *URINARY PROBLEMS (pain or burning when urinating, or frequent urination) *BOWEL PROBLEMS (unusual diarrhea, constipation, pain near the anus) TENDERNESS IN MOUTH AND THROAT WITH OR WITHOUT PRESENCE OF ULCERS (sore throat, sores in mouth, or a toothache) UNUSUAL RASH, SWELLING OR PAIN  UNUSUAL VAGINAL DISCHARGE OR ITCHING   Items with * indicate a potential emergency and should be followed up as soon as possible or go to the Emergency Department if any problems should occur.  Please show the CHEMOTHERAPY ALERT CARD or IMMUNOTHERAPY ALERT  CARD at check-in to the Emergency Department and triage nurse.  Should you have questions after your visit or need to cancel or reschedule your appointment, please contact [facility]  Dept: [phone]  and follow the prompts.  Office hours are 8:00 a.m. to 4:30 p.m. Monday - Friday. Please note that voicemails left after 4:00 p.m. may not be returned until the following business day.  We are closed weekends and major holidays. You have access to a nurse at all times for urgent questions. Please call the main number to the clinic Dept: [phone] and follow the prompts.   For any non-urgent questions, you may also contact your provider using MyChart. We now offer e-Visits for anyone 43 and older to request care online for non-urgent symptoms. For details visit [email or web address].   Also download the MyChart app! Go to the app store, search "MyChart", open the app, select [facility], and log in with your MyChart username and password.  Paclitaxel Injection What is this medication? PACLITAXEL (PAK li TAX el) treats some types of cancer. It works by slowing down the growth of cancer cells. This medicine may be used for other purposes; ask your health care provider or pharmacist if you have questions. COMMON BRAND NAME(S): Onxol, Taxol What should I tell my care team before I take this medication? They need to know if you have any of these conditions: Heart disease Liver disease Low white blood cell levels An unusual or allergic reaction to paclitaxel, other medications, foods, dyes, or preservatives If you or your partner are pregnant or trying to get pregnant  Breast-feeding How should I use this medication? This medication is injected into a vein. It is given by your care team in a hospital or clinic setting. Talk to your care team about the use of this medication in children. While it may be given to children for selected conditions, precautions do  apply. Overdosage: If you think you have taken too much of this medicine contact a poison control center or emergency room at once. NOTE: This medicine is only for you. Do not share this medicine with others. What if I miss a dose? Keep appointments for follow-up doses. It is important not to miss your dose. Call your care team if you are unable to keep an appointment. What may interact with this medication? Do not take this medication with any of the following: Live virus vaccines Other medications may affect the way this medication works. Talk with your care team about all of the medications you take. They may suggest changes to your treatment plan to lower the risk of side effects and to make sure your medications work as intended. This list may not describe all possible interactions. Give your health care provider a list of all the medicines, herbs, non-prescription drugs, or dietary supplements you use. Also tell them if you smoke, drink alcohol, or use illegal drugs. Some items may interact with your medicine. What should I watch for while using this medication? Your condition will be monitored carefully while you are receiving this medication. You may need blood work while taking this medication. This medication may make you feel generally unwell. This is not uncommon as chemotherapy can affect healthy cells as well as cancer cells. Report any side effects. Continue your course of treatment even though you feel ill unless your care team tells you to stop. This medication can cause serious allergic reactions. To reduce the risk, your care team may give you other medications to take before receiving this one. Be sure to follow the directions from your care team. This medication may increase your risk of getting an infection. Call your care team for advice if you get a fever, chills, sore throat, or other symptoms of a cold or flu. Do not treat yourself. Try to avoid being around people who are  sick. This medication may increase your risk to bruise or bleed. Call your care team if you notice any unusual bleeding. Be careful brushing or flossing your teeth or using a toothpick because you may get an infection or bleed more easily. If you have any dental work done, tell your dentist you are receiving this medication. Talk to your care team if you may be pregnant. Serious birth defects can occur if you take this medication during pregnancy. Talk to your care team before breastfeeding. Changes to your treatment plan may be needed. What side effects may I notice from receiving this medication? Side effects that you should report to your care team as soon as possible: Allergic reactions--skin rash, itching, hives, swelling of the face, lips, tongue, or throat Heart rhythm changes--fast or irregular heartbeat, dizziness, feeling faint or lightheaded, chest pain, trouble breathing Increase in blood pressure Infection--fever, chills, cough, sore throat, wounds that don't heal, pain or trouble when passing urine, general feeling of discomfort or being unwell Low blood pressure--dizziness, feeling faint or lightheaded, blurry vision Low red blood cell level--unusual weakness or fatigue, dizziness, headache, trouble breathing Painful swelling, warmth, or redness of the skin, blisters or sores at the infusion site Pain, tingling, or numbness in the  numbness in the hands or feet Slow heartbeat--dizziness, feeling faint or lightheaded, confusion, trouble breathing, unusual weakness or fatigue Unusual bruising or bleeding Side effects that usually do not require medical attention (report to your care team if they continue or are bothersome): Diarrhea Hair loss Joint pain Loss of appetite Muscle pain Nausea Vomiting This list may not describe all possible side effects. Call your doctor for medical advice about side effects. You may report side effects to FDA at 1-800-FDA-1088. Where should I keep  my medication? This medication is given in a hospital or clinic. It will not be stored at home. NOTE: This sheet is a summary. It may not cover all possible information. If you have questions about this medicine, talk to your doctor, pharmacist, or health care provider.  2023 Elsevier/Gold Standard (2022-03-13 00:00:00)  Carboplatin Injection What is this medication? CARBOPLATIN (KAR boe pla tin) treats some types of cancer. It works by slowing down the growth of cancer cells. This medicine may be used for other purposes; ask your health care provider or pharmacist if you have questions. COMMON BRAND NAME(S): Paraplatin What should I tell my care team before I take this medication? They need to know if you have any of these conditions: Blood disorders Hearing problems Kidney disease Recent or ongoing radiation therapy An unusual or allergic reaction to carboplatin, cisplatin, other medications, foods, dyes, or preservatives Pregnant or trying to get pregnant Breast-feeding How should I use this medication? This medication is injected into a vein. It is given by your care team in a hospital or clinic setting. Talk to your care team about the use of this medication in children. Special care may be needed. Overdosage: If you think you have taken too much of this medicine contact a poison control center or emergency room at once. NOTE: This medicine is only for you. Do not share this medicine with others. What if I miss a dose? Keep appointments for follow-up doses. It is important not to miss your dose. Call your care team if you are unable to keep an appointment. What may interact with this medication? Medications for seizures Some antibiotics, such as amikacin, gentamicin, neomycin, streptomycin, tobramycin Vaccines This list may not describe all possible interactions. Give your health care provider a list of all the medicines, herbs, non-prescription drugs, or dietary supplements you  use. Also tell them if you smoke, drink alcohol, or use illegal drugs. Some items may interact with your medicine. What should I watch for while using this medication? Your condition will be monitored carefully while you are receiving this medication. You may need blood work while taking this medication. This medication may make you feel generally unwell. This is not uncommon, as chemotherapy can affect healthy cells as well as cancer cells. Report any side effects. Continue your course of treatment even though you feel ill unless your care team tells you to stop. In some cases, you may be given additional medications to help with side effects. Follow all directions for their use. This medication may increase your risk of getting an infection. Call your care team for advice if you get a fever, chills, sore throat, or other symptoms of a cold or flu. Do not treat yourself. Try to avoid being around people who are sick. Avoid taking medications that contain aspirin, acetaminophen, ibuprofen, naproxen, or ketoprofen unless instructed by your care team. These medications may hide a fever. Be careful brushing or flossing your teeth or using a toothpick because you may get   more easily. If you have any dental work done, tell your dentist you are receiving this medication. Talk to your care team if you wish to become pregnant or think you might be pregnant. This medication can cause serious birth defects. Talk to your care team about effective forms of contraception. Do not breast-feed while taking this medication. What side effects may I notice from receiving this medication? Side effects that you should report to your care team as soon as possible: Allergic reactions--skin rash, itching, hives, swelling of the face, lips, tongue, or throat Infection--fever, chills, cough, sore throat, wounds that don't heal, pain or trouble when passing urine, general feeling of discomfort or being  unwell Low red blood cell level--unusual weakness or fatigue, dizziness, headache, trouble breathing Pain, tingling, or numbness in the hands or feet, muscle weakness, change in vision, confusion or trouble speaking, loss of balance or coordination, trouble walking, seizures Unusual bruising or bleeding Side effects that usually do not require medical attention (report to your care team if they continue or are bothersome): Hair loss Nausea Unusual weakness or fatigue Vomiting This list may not describe all possible side effects. Call your doctor for medical advice about side effects. You may report side effects to FDA at 1-800-FDA-1088. Where should I keep my medication? This medication is given in a hospital or clinic. It will not be stored at home. NOTE: This sheet is a summary. It may not cover all possible information. If you have questions about this medicine, talk to your doctor, pharmacist, or health care provider.  2023 Elsevier/Gold Standard (2022-02-25 00:00:00)

## 2023-01-02 NOTE — Progress Notes (Signed)
[facility] [place] OFFICE PROGRESS NOTE   Diagnosis: Esophagus cancer  INTERVAL HISTORY:   Jo Morrison returns as scheduled.  Continued dysphagia/odynophagia.  Pain medication helps.  Baseline numbness fingertips right hand.  She relates this to rotator cuff surgery.  Objective:  Vital signs in last 24 hours:  Blood pressure (!) 145/86, pulse (!) 113, temperature 98.1 F (36.7 C), temperature source Oral, resp. rate 18, height '5\' 3"'$  (1.6 m), weight 105 lb (47.6 kg), SpO2 100 %.    Resp: Lungs clear bilaterally. Cardio: Regular rate and rhythm. GI: No hepatosplenomegaly. Vascular: No leg edema. Neuro: Mild decrease in vibratory sense fingertips bilaterally. Skin: No rash.   Lab Results:  Lab Results  Component Value Date   WBC 7.0 01/02/2023   HGB 15.1 (H) 01/02/2023   HCT 46.2 (H) 01/02/2023   MCV 93.0 01/02/2023   PLT 369 01/02/2023   NEUTROABS 6.1 01/02/2023    Imaging:  No results found.  Medications: I have reviewed the patient's current medications.  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  For hypermetabolic metastatic lymph nodes, 3 in the mediastinum and 1 in the upper abdomen gastrohepatic ligament.  EUS 02/10/2020-ulcerated mass in the mid esophagus,uT3, 3 suspicious lymph nodes near the primary tumor, malignant appearing lymph node at the gastrohepatic ligament (level 18)-metastatic squamous cell carcinoma Radiation 01/31/2020 Cycle 1 weekly Taxol/carboplatin  02/01/2020 Cycle 2 weekly Taxol/carboplatin 02/08/2020 Cycle 3 weekly Taxol/carboplatin 02/15/2020 Cycle 4 weekly Taxol/carboplatin 02/22/2020 Cycle 5 weekly Taxol/carboplatin 02/29/2020 Radiation completed 03/08/2020 CTs 04/03/2020-esophageal mass appears grossly smaller, regression of previously noted lymphadenopathy in the mediastinum and upper abdomen, nonocclusive pulmonary embolus right-sided pulmonary arteries Upper endoscopy 05/02/2020-treatment effect visible starting approximately 26 cm from the incisors.  Scope passed easily into the stomach and proximal duodenum.  Stomach and proximal duodenum grossly normal.  Scope withdrawn to the mid esophagus with four-quadrant biopsies obtained.  2 biopsy showed acute ulcerative esophagitis; 2 biopsies showed squamous mucosa with minimal histologic abnormality. CTs 07/24/2020-no evidence of metastatic disease, left gastric lymph node has decreased in size, mild wall thickening of the mid to distal esophagus Upper endoscopy 07/25/2020-multiple biopsies with changes of ulcerative esophagitis, negative fungal, HSV, and CMV stains, no evidence of malignancy CTs at [facility] 11/27/2020-interval decrease in prior mediastinal groundglass opacities; decrease in now mild diffuse esophageal wall thickening, most prominent in the mid and distal esophagus; few scattered 3 mm and smaller noncalcified pulmonary nodules not significantly changed.  No definite new or enlarging pulmonary nodule.  No evidence of metastatic disease in the abdomen. EGD 11/28/2020-mild and discrete stricture in the distal esophagus; no mass lesions seen.  Biopsies taken, negative for malignancy x4; serial dilatation performed. CTs at [facility] 08/06/2021-no evidence of recurrent disease EGD 08/07/2021-no mass, mild stricture, multiple biopsies negative for carcinoma EGD at [facility] 12/25/2021-mild stricture, no mass, biopsies negative for carcinoma, 1 biopsy with possible low-grade dysplasia CTs 12/24/2021-negative for  recurrent disease CTs at [facility] 07/01/2022-no evidence of recurrent disease Endoscopy 07/02/2022-negative for malignancy Progressive dysphagia/odynophagia status post EGD 12/17/2022-near complete obstruction UES, biopsy-moderately differentiated squamous cell carcinoma; PD-L1 CPS 45  PET scan at [facility] 12/18/2022-FDG avidity in the  cervical esophagus.  No FDG avid metastatic disease. Radiation 01/02/2023 Cycle 1 weekly Taxol/carboplatin 01/02/2023 Right breast cancer July 1996, [name], [name], right lumpectomy and axillary dissection followed by adjuvant Adriamycin/Taxol chemotherapy-4 cycles, 5 years of tamoxifen, and breast radiation   3.   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  Depression   5.  Hypertension   6.  Right eye uveitis, macular edema, and corneal edema   7.  Asthma   8.  Pulmonary embolus on CT 04/03/2020-lovenox initiated, anticoagulation changed to apixaban beginning 08/03/2020, anticoagulation completed approximately the beginning of December 2021   9.  Odynophagia secondary to radiation -resolved  Disposition: [name] appears unchanged.  She is scheduled to begin treatment today with weekly Taxol/carboplatin.  We again reviewed potential toxicities.  She agrees to proceed.  She is scheduled to begin radiation later today.  We reviewed the PD-1 CPS score of 45.  Immunotherapy will be considered at the  completion of concurrent chemo/radiation therapy.  CBC and chemistry panel reviewed.  Labs adequate to proceed as above.  She has hypokalemia.  She will begin liquid KCl 20 mEq daily.  She will return for lab, follow-up, cycle 2 weekly Taxol/carboplatin in 1 week.  We are available to see her sooner if needed.  Patient seen with Dr. Benay Spice.    Ned Card ANP/GNP-BC   01/02/2023  10:02 AM This was a shared visit with Ned Card.  [name] appears unchanged.   The plan is to begin weekly Taxol/carboplatin and radiation today.  I reviewed the case with Dr. Williemae Natter.  She reports she and Dr. Fanny Skates are in agreement.  The PD-L1 score returned elevated.  We will recommend a PD-1 inhibitor if there is residual disease at the completion of chemotherapy/radiation.  I was present for greater than 50% of today's visit.  I performed medical decision making.  Julieanne Manson, MD

## 2023-01-03 ENCOUNTER — Telehealth: Payer: Self-pay

## 2023-01-03 ENCOUNTER — Other Ambulatory Visit: Payer: Self-pay

## 2023-01-03 ENCOUNTER — Ambulatory Visit
Admission: RE | Admit: 2023-01-03 | Discharge: 2023-01-03 | Disposition: A | Discharge status: Home / Self Care | Payer: PPO | Source: Ambulatory Visit | Attending: Radiation Oncology | Admitting: Radiation Oncology

## 2023-01-03 ENCOUNTER — Ambulatory Visit: Payer: PPO

## 2023-01-03 DIAGNOSIS — C158 Malignant neoplasm of overlapping sites of esophagus: Secondary | ICD-10-CM

## 2023-01-03 DIAGNOSIS — Z510 Encounter for antineoplastic radiation therapy: Secondary | ICD-10-CM | POA: Diagnosis not present

## 2023-01-03 DIAGNOSIS — Z5111 Encounter for antineoplastic chemotherapy: Secondary | ICD-10-CM | POA: Diagnosis not present

## 2023-01-03 LAB — RAD ONC ARIA SESSION SUMMARY
Course Elapsed Days: 1
Plan Fractions Treated to Date: 2
Plan Prescribed Dose Per Fraction: 1.8 Gy
Plan Total Fractions Prescribed: 25
Plan Total Prescribed Dose: 45 Gy
Reference Point Dosage Given to Date: 3.6 Gy
Reference Point Session Dosage Given: 1.8 Gy
Session Number: 2

## 2023-01-03 MED ORDER — SONAFINE EX EMUL
1.0000 | Freq: Once | CUTANEOUS | Status: AC
  Administered 2023-01-03: 1 via TOPICAL

## 2023-01-03 NOTE — Telephone Encounter (Signed)
Jo Morrison  Telephone call to patient post her first time Taxol/Carboplatin treatment. She denied any complaints or concerns. She denied having any side effects. She verbalized "I am feeling just great!" Patient knows to call the clinic if this changes.

## 2023-01-06 ENCOUNTER — Inpatient Hospital Stay: Payer: PPO

## 2023-01-06 ENCOUNTER — Other Ambulatory Visit: Payer: Self-pay

## 2023-01-06 ENCOUNTER — Inpatient Hospital Stay: Payer: PPO | Admitting: Nurse Practitioner

## 2023-01-06 ENCOUNTER — Ambulatory Visit
Admission: RE | Admit: 2023-01-06 | Discharge: 2023-01-06 | Disposition: A | Discharge status: Home / Self Care | Payer: PPO | Source: Ambulatory Visit | Attending: Radiation Oncology | Admitting: Radiation Oncology

## 2023-01-06 DIAGNOSIS — Z510 Encounter for antineoplastic radiation therapy: Secondary | ICD-10-CM | POA: Diagnosis not present

## 2023-01-06 DIAGNOSIS — C158 Malignant neoplasm of overlapping sites of esophagus: Secondary | ICD-10-CM | POA: Diagnosis not present

## 2023-01-06 DIAGNOSIS — Z5111 Encounter for antineoplastic chemotherapy: Secondary | ICD-10-CM | POA: Diagnosis not present

## 2023-01-06 LAB — RAD ONC ARIA SESSION SUMMARY
Course Elapsed Days: 4
Plan Fractions Treated to Date: 3
Plan Prescribed Dose Per Fraction: 1.8 Gy
Plan Total Fractions Prescribed: 25
Plan Total Prescribed Dose: 45 Gy
Reference Point Dosage Given to Date: 5.4 Gy
Reference Point Session Dosage Given: 1.8 Gy
Session Number: 3

## 2023-01-07 ENCOUNTER — Encounter: Payer: Self-pay | Admitting: Internal Medicine

## 2023-01-07 ENCOUNTER — Ambulatory Visit
Admission: RE | Admit: 2023-01-07 | Discharge: 2023-01-07 | Disposition: A | Discharge status: Home / Self Care | Payer: PPO | Source: Ambulatory Visit | Attending: Radiation Oncology | Admitting: Radiation Oncology

## 2023-01-07 ENCOUNTER — Other Ambulatory Visit: Payer: Self-pay

## 2023-01-07 DIAGNOSIS — C158 Malignant neoplasm of overlapping sites of esophagus: Secondary | ICD-10-CM | POA: Diagnosis not present

## 2023-01-07 DIAGNOSIS — Z5111 Encounter for antineoplastic chemotherapy: Secondary | ICD-10-CM | POA: Diagnosis not present

## 2023-01-07 DIAGNOSIS — Z510 Encounter for antineoplastic radiation therapy: Secondary | ICD-10-CM | POA: Diagnosis not present

## 2023-01-07 LAB — RAD ONC ARIA SESSION SUMMARY
Course Elapsed Days: 5
Plan Fractions Treated to Date: 4
Plan Prescribed Dose Per Fraction: 1.8 Gy
Plan Total Fractions Prescribed: 25
Plan Total Prescribed Dose: 45 Gy
Reference Point Dosage Given to Date: 7.2 Gy
Reference Point Session Dosage Given: 1.8 Gy
Session Number: 4

## 2023-01-08 ENCOUNTER — Other Ambulatory Visit: Payer: Self-pay

## 2023-01-08 ENCOUNTER — Telehealth: Payer: Self-pay | Admitting: Dietician

## 2023-01-08 ENCOUNTER — Encounter: Payer: PPO | Admitting: Dietician

## 2023-01-08 ENCOUNTER — Ambulatory Visit
Admission: RE | Admit: 2023-01-08 | Discharge: 2023-01-08 | Disposition: A | Discharge status: Home / Self Care | Payer: PPO | Source: Ambulatory Visit | Attending: Radiation Oncology | Admitting: Radiation Oncology

## 2023-01-08 DIAGNOSIS — Z510 Encounter for antineoplastic radiation therapy: Secondary | ICD-10-CM | POA: Diagnosis not present

## 2023-01-08 DIAGNOSIS — C158 Malignant neoplasm of overlapping sites of esophagus: Secondary | ICD-10-CM | POA: Diagnosis not present

## 2023-01-08 DIAGNOSIS — Z5111 Encounter for antineoplastic chemotherapy: Secondary | ICD-10-CM | POA: Diagnosis not present

## 2023-01-08 LAB — RAD ONC ARIA SESSION SUMMARY
Course Elapsed Days: 6
Plan Fractions Treated to Date: 5
Plan Prescribed Dose Per Fraction: 1.8 Gy
Plan Total Fractions Prescribed: 25
Plan Total Prescribed Dose: 45 Gy
Reference Point Dosage Given to Date: 9 Gy
Reference Point Session Dosage Given: 1.8 Gy
Session Number: 5

## 2023-01-08 NOTE — Telephone Encounter (Signed)
Patient starting treatment for esophageal cancer.  First attempt to reach. Called home/cell#. Patient not in an area to speak freely.  She agreed to talk tomorrow during her infusion. Set up a remote nutrition consult for tomorrow.  April Manson, RDN, LDN Registered Dietitian, [facility] Part Time Remote (Usual office hours: Tuesday-Thursday) Cell: [phone]

## 2023-01-09 ENCOUNTER — Encounter: Payer: Self-pay | Admitting: Nurse Practitioner

## 2023-01-09 ENCOUNTER — Inpatient Hospital Stay: Payer: PPO | Admitting: Nurse Practitioner

## 2023-01-09 ENCOUNTER — Inpatient Hospital Stay: Payer: PPO

## 2023-01-09 ENCOUNTER — Ambulatory Visit: Payer: PPO | Admitting: Dietician

## 2023-01-09 ENCOUNTER — Ambulatory Visit
Admission: RE | Admit: 2023-01-09 | Discharge: 2023-01-09 | Disposition: A | Discharge status: Home / Self Care | Payer: PPO | Source: Ambulatory Visit | Attending: Radiation Oncology | Admitting: Radiation Oncology

## 2023-01-09 ENCOUNTER — Other Ambulatory Visit: Payer: Self-pay

## 2023-01-09 VITALS — BP 130/68 | HR 87 | Resp 18

## 2023-01-09 DIAGNOSIS — C158 Malignant neoplasm of overlapping sites of esophagus: Secondary | ICD-10-CM | POA: Diagnosis not present

## 2023-01-09 DIAGNOSIS — Z510 Encounter for antineoplastic radiation therapy: Secondary | ICD-10-CM | POA: Diagnosis not present

## 2023-01-09 DIAGNOSIS — Z5111 Encounter for antineoplastic chemotherapy: Secondary | ICD-10-CM | POA: Diagnosis not present

## 2023-01-09 LAB — CBC WITH DIFFERENTIAL (CANCER CENTER ONLY)
Abs Immature Granulocytes: 0.02 10*3/uL (ref 0.00–0.07)
Basophils Absolute: 0 10*3/uL (ref 0.0–0.1)
Basophils Relative: 0 %
Eosinophils Absolute: 0.2 10*3/uL (ref 0.0–0.5)
Eosinophils Relative: 4 %
HCT: 38.2 % (ref 36.0–46.0)
Hemoglobin: 12.9 g/dL (ref 12.0–15.0)
Immature Granulocytes: 0 %
Lymphocytes Relative: 9 %
Lymphs Abs: 0.5 10*3/uL — ABNORMAL LOW (ref 0.7–4.0)
MCH: 31 pg (ref 26.0–34.0)
MCHC: 33.8 g/dL (ref 30.0–36.0)
MCV: 91.8 fL (ref 80.0–100.0)
Monocytes Absolute: 0.4 10*3/uL (ref 0.1–1.0)
Monocytes Relative: 8 %
Neutro Abs: 4.4 10*3/uL (ref 1.7–7.7)
Neutrophils Relative %: 79 %
Platelet Count: 316 10*3/uL (ref 150–400)
RBC: 4.16 MIL/uL (ref 3.87–5.11)
RDW: 12.5 % (ref 11.5–15.5)
WBC Count: 5.5 10*3/uL (ref 4.0–10.5)
nRBC: 0 % (ref 0.0–0.2)

## 2023-01-09 LAB — CMP (CANCER CENTER ONLY)
ALT: 20 U/L (ref 0–44)
AST: 27 U/L (ref 15–41)
Albumin: 3.7 g/dL (ref 3.5–5.0)
Alkaline Phosphatase: 51 U/L (ref 38–126)
Anion gap: 11 (ref 5–15)
BUN: 15 mg/dL (ref 8–23)
CO2: 28 mmol/L (ref 22–32)
Calcium: 9.2 mg/dL (ref 8.9–10.3)
Chloride: 100 mmol/L (ref 98–111)
Creatinine: 0.77 mg/dL (ref 0.44–1.00)
GFR, Estimated: >60 mL/min (ref >60)
Glucose, Bld: 109 mg/dL — ABNORMAL HIGH (ref 70–99)
Potassium: 4.1 mmol/L (ref 3.5–5.1)
Sodium: 139 mmol/L (ref 135–145)
Total Bilirubin: 0.8 mg/dL (ref 0.3–1.2)
Total Protein: 6.8 g/dL (ref 6.5–8.1)

## 2023-01-09 LAB — RAD ONC ARIA SESSION SUMMARY
Course Elapsed Days: 7
Plan Fractions Treated to Date: 6
Plan Prescribed Dose Per Fraction: 1.8 Gy
Plan Total Fractions Prescribed: 25
Plan Total Prescribed Dose: 45 Gy
Reference Point Dosage Given to Date: 10.8 Gy
Reference Point Session Dosage Given: 1.8 Gy
Session Number: 6

## 2023-01-09 MED ORDER — SODIUM CHLORIDE 0.9 % IV SOLN: Freq: Once | INTRAVENOUS | Status: AC

## 2023-01-09 MED ORDER — PALONOSETRON HCL INJECTION 0.25 MG/5ML
0.2500 mg | Freq: Once | INTRAVENOUS | Status: AC
  Administered 2023-01-09: 0.25 mg via INTRAVENOUS
  Filled 2023-01-09: qty 5

## 2023-01-09 MED ORDER — FAMOTIDINE IN NACL 20-0.9 MG/50ML-% IV SOLN
20.0000 mg | Freq: Once | INTRAVENOUS | Status: AC
  Administered 2023-01-09: 20 mg via INTRAVENOUS
  Filled 2023-01-09: qty 50

## 2023-01-09 MED ORDER — SODIUM CHLORIDE 0.9 % IV SOLN
10.0000 mg | Freq: Once | INTRAVENOUS | Status: AC
  Administered 2023-01-09: 10 mg via INTRAVENOUS
  Filled 2023-01-09: qty 10

## 2023-01-09 MED ORDER — SODIUM CHLORIDE 0.9 % IV SOLN
50.0000 mg/m2 | Freq: Once | INTRAVENOUS | Status: AC
  Administered 2023-01-09: 72 mg via INTRAVENOUS
  Filled 2023-01-09: qty 12

## 2023-01-09 MED ORDER — DIPHENHYDRAMINE HCL 50 MG/ML IJ SOLN
50.0000 mg | Freq: Once | INTRAMUSCULAR | Status: AC
  Administered 2023-01-09: 50 mg via INTRAVENOUS
  Filled 2023-01-09: qty 1

## 2023-01-09 MED ORDER — SODIUM CHLORIDE 0.9 % IV SOLN
121.0000 mg | Freq: Once | INTRAVENOUS | Status: AC
  Administered 2023-01-09: 120 mg via INTRAVENOUS
  Filled 2023-01-09: qty 12

## 2023-01-09 NOTE — Progress Notes (Signed)
Patient seen by Lisa Thomas NP today  Vitals are within treatment parameters.  Labs reviewed by Lisa Thomas NP and are within treatment parameters.  Per physician team, patient is ready for treatment and there are NO modifications to the treatment plan.     

## 2023-01-09 NOTE — Progress Notes (Signed)
[facility] OFFICE PROGRESS NOTE   Diagnosis: Esophagus cancer  INTERVAL HISTORY:   Jo Morrison returns as scheduled.  She continues radiation.  She completed cycle 1 week 1 Taxol/carboplatin 01/02/2023.  She feels she tolerated the chemotherapy well.  No signs of allergic reaction.  No nausea or vomiting.  No mouth sores.  No diarrhea.  Recent constipation.  She plans to begin a laxative.  No change in baseline numbness involving the right hand.  Continued dysphagia/odynophagia.  Objective:  Vital signs in last 24 hours:  Blood pressure 114/76, pulse 98, temperature 97.9 F (36.6 C), temperature source Oral, resp. rate 16, height 5' 3"$  (1.6 m), weight 105 lb 9.6 oz (47.9 kg), SpO2 100 %.    HEENT: No thrush or ulcers. Resp: Lungs clear bilaterally. Cardio: Regular rate and rhythm. GI: No hepatosplenomegaly. Vascular: No leg edema.   Lab Results:  Lab Results  Component Value Date   WBC 5.5 01/09/2023   HGB 12.9 01/09/2023   HCT 38.2 01/09/2023   MCV 91.8 01/09/2023   PLT 316 01/09/2023   NEUTROABS 4.4 01/09/2023    Imaging:  No results found.  Medications: I have reviewed the patient's current medications.  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  For hypermetabolic metastatic lymph nodes, 3 in the mediastinum and 1 in the upper abdomen gastrohepatic ligament.  EUS 02/10/2020-ulcerated mass in the mid esophagus,uT3, 3 suspicious lymph nodes near the primary tumor,  malignant appearing lymph node at the gastrohepatic ligament (level 18)-metastatic squamous cell carcinoma Radiation 01/31/2020 Cycle 1 weekly Taxol/carboplatin 02/01/2020 Cycle 2 weekly Taxol/carboplatin 02/08/2020 Cycle 3 weekly Taxol/carboplatin 02/15/2020 Cycle 4 weekly Taxol/carboplatin 02/22/2020 Cycle 5 weekly Taxol/carboplatin 02/29/2020 Radiation completed 03/08/2020 CTs 04/03/2020-esophageal mass appears grossly smaller, regression of previously noted lymphadenopathy in the mediastinum and upper abdomen, nonocclusive pulmonary embolus right-sided pulmonary arteries Upper endoscopy 05/02/2020-treatment effect visible starting approximately 26 cm from the incisors.  Scope passed easily into the stomach and proximal duodenum.  Stomach and proximal duodenum grossly normal.  Scope withdrawn to the mid esophagus with four-quadrant biopsies obtained.  2 biopsy showed acute ulcerative esophagitis; 2 biopsies showed squamous mucosa with minimal histologic abnormality. CTs 07/24/2020-no evidence of metastatic disease, left gastric lymph node has decreased in size, mild wall thickening of the mid to distal esophagus Upper endoscopy 07/25/2020-multiple biopsies with changes of ulcerative esophagitis, negative fungal, HSV, and CMV stains, no evidence of malignancy CTs at [facility] 11/27/2020-interval decrease in prior mediastinal groundglass opacities; decrease in now mild diffuse esophageal wall thickening, most prominent in the mid and distal esophagus; few scattered 3 mm and smaller noncalcified pulmonary nodules not significantly changed.  No definite new or enlarging pulmonary nodule.  No evidence of metastatic disease in the abdomen. EGD 11/28/2020-mild and discrete stricture in the distal esophagus; no mass lesions seen.  Biopsies taken, negative for malignancy x4; serial dilatation performed. CTs at [facility] 08/06/2021-no evidence of recurrent disease EGD 08/07/2021-no mass, mild stricture, multiple biopsies negative for  carcinoma EGD at [facility] 12/25/2021-mild stricture, no mass, biopsies negative for carcinoma, 1 biopsy with possible low-grade dysplasia CTs 12/24/2021-negative for recurrent disease CTs at [facility] 07/01/2022-no evidence  of recurrent disease Endoscopy 07/02/2022-negative for malignancy Progressive dysphagia/odynophagia status post EGD 12/17/2022-near complete obstruction UES, biopsy-moderately differentiated squamous cell carcinoma; PD-L1 CPS 45  PET scan at [facility] 12/18/2022-FDG avidity in the cervical esophagus.  No FDG avid metastatic disease. Radiation 01/02/2023 Cycle 1 weekly Taxol/carboplatin 01/02/2023 Cycle 2 weekly Taxol/carboplatin 01/09/2023 Right breast cancer July 1996, [name], [name], right lumpectomy and axillary dissection followed by adjuvant Adriamycin/Taxol chemotherapy-4 cycles, 5 years of tamoxifen, and breast radiation   3.   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  Depression   5.  Hypertension   6.  Right eye uveitis, macular edema, and corneal edema   7.  Asthma   8.  Pulmonary embolus on CT 04/03/2020-lovenox initiated, anticoagulation changed to apixaban beginning 08/03/2020, anticoagulation completed approximately the beginning of December 2021   9.  Odynophagia secondary to radiation -resolved  Disposition: Jo Morrison appears stable.  She continues radiation.  She tolerated week 1 Taxol/carboplatin well.  Plan to proceed with week 2 today as scheduled.  CBC and chemistry panel reviewed.  Labs adequate to proceed as above.  She will return for lab and week 3 chemotherapy 01/16/2023.  We will see her in follow-up in 2 weeks.    Ned Card ANP/GNP-BC   01/09/2023  10:11 AM

## 2023-01-09 NOTE — Progress Notes (Signed)
Nutrition Assessment Called patient at her cell during infusion.  Reason for Assessment: MST screen for weight loss.    ASSESSMENT: Patient is a 77 year old female who is under treatment for with weekly Taxol/carboplatin with radiation.  She is being followed by Dr. Ammie Dalton.  She has PMHx significant for squamous cell carcinoma of the esophagus, bilateral breast cancers, HTN, chronic diarrhea, and depression.  She has progressive dysphagia/odynophagia and is only tolerating liquids at this time.  Current PO intake includes: chocolate "Slate" shakes  (she likes taste more than other ONS that tasted chalky to her) home made smoothies(Greek yogurt, spinach, peaches w/whey protein powder) pureed soups.  Denies any NIS at this time other than constipation.  She and NP found a laxative tea she will attempt to help manage.   Anthropometrics:   Height: 63" Weight:  01/09/23  105.6# UBW: 105-111#  [id number] BMI: 18.71  INTERVENTION:  Relayed that nutrition services are wrap around service provided at no charge and encouraged continued communication if experiencing continued weight loss or any nutritional impact symptoms (NIS). Discussed ways to add calories to smoothies with addition of healthy fats. Reviewed high potassium foods/products to add to smoothies (bananas, tomato/vegetable juice to soupies, powdered Pedialyte, Electrolyte replacement drinks). Encouraged prune juice and apple juice to aid in bowel regularity  Emailed Nutrition Tip sheet  for  constipation with contact information provided.  MONITORING, EVALUATION, GOAL: weight, PO intake, Nutrition Impact Symptoms, labs Goal is weight maintenance  Next Visit: PRN at patient or provider request  April Manson, RDN, LDN Registered Dietitian, [facility] Part Time Remote (Usual office hours: Tuesday-Thursday) Cell: [phone]

## 2023-01-09 NOTE — Patient Instructions (Signed)
Jo Morrison   Discharge Instructions: Thank you for choosing [facility] to provide your oncology and hematology care.   If you have a lab appointment with the [facility], please go directly to the [facility] and check in at the registration area.   Wear comfortable clothing and clothing appropriate for easy access to any Portacath or PICC line.   We strive to give you quality time with your provider. You may need to reschedule your appointment if you arrive late (15 or more minutes).  Arriving late affects you and other patients whose appointments are after yours.  Also, if you miss three or more appointments without notifying the office, you may be dismissed from the clinic at the provider's discretion.      For prescription refill requests, have your pharmacy contact our office and allow 72 hours for refills to be completed.    Today you received the following chemotherapy and/or immunotherapy agents Paclitaxel (TAXOL) & Carboplatin (PARAPLATIN).      To help prevent nausea and vomiting after your treatment, we encourage you to take your nausea medication as directed.  BELOW ARE SYMPTOMS THAT SHOULD BE REPORTED IMMEDIATELY: *FEVER GREATER THAN 100.4 F (38 C) OR HIGHER *CHILLS OR SWEATING *NAUSEA AND VOMITING THAT IS NOT CONTROLLED WITH YOUR NAUSEA MEDICATION *UNUSUAL SHORTNESS OF BREATH *UNUSUAL BRUISING OR BLEEDING *URINARY PROBLEMS (pain or burning when urinating, or frequent urination) *BOWEL PROBLEMS (unusual diarrhea, constipation, pain near the anus) TENDERNESS IN MOUTH AND THROAT WITH OR WITHOUT PRESENCE OF ULCERS (sore throat, sores in mouth, or a toothache) UNUSUAL RASH, SWELLING OR PAIN  UNUSUAL VAGINAL DISCHARGE OR ITCHING   Items with * indicate a potential emergency and should be followed up as soon as possible or go to the Emergency Department if any problems should occur.  Please show the CHEMOTHERAPY ALERT CARD  or IMMUNOTHERAPY ALERT CARD at check-in to the Emergency Department and triage nurse.  Should you have questions after your visit or need to cancel or reschedule your appointment, please contact [facility]  Dept: [phone]  and follow the prompts.  Office hours are 8:00 a.m. to 4:30 p.m. Monday - Friday. Please note that voicemails left after 4:00 p.m. may not be returned until the following business day.  We are closed weekends and major holidays. You have access to a nurse at all times for urgent questions. Please call the main number to the clinic Dept: [phone] and follow the prompts.   For any non-urgent questions, you may also contact your provider using MyChart. We now offer e-Visits for anyone 65 and older to request care online for non-urgent symptoms. For details visit [email or web address].   Also download the MyChart app! Go to the app store, search "MyChart", open the app, select [facility], and log in with your MyChart username and password.  Paclitaxel Injection What is this medication? PACLITAXEL (PAK li TAX el) treats some types of cancer. It works by slowing down the growth of cancer cells. This medicine may be used for other purposes; ask your health care provider or pharmacist if you have questions. COMMON BRAND NAME(S): Onxol, Taxol What should I tell my care team before I take this medication? They need to know if you have any of these conditions: Heart disease Liver disease Low white blood cell levels An unusual or allergic reaction to paclitaxel, other medications, foods, dyes, or preservatives If you or your partner are pregnant or  trying to get pregnant Breast-feeding How should I use this medication? This medication is injected into a vein. It is given by your care team in a hospital or clinic setting. Talk to your care team about the use of this medication in children. While it may be given to children for selected  conditions, precautions do apply. Overdosage: If you think you have taken too much of this medicine contact a poison control center or emergency room at once. NOTE: This medicine is only for you. Do not share this medicine with others. What if I miss a dose? Keep appointments for follow-up doses. It is important not to miss your dose. Call your care team if you are unable to keep an appointment. What may interact with this medication? Do not take this medication with any of the following: Live virus vaccines Other medications may affect the way this medication works. Talk with your care team about all of the medications you take. They may suggest changes to your treatment plan to lower the risk of side effects and to make sure your medications work as intended. This list may not describe all possible interactions. Give your health care provider a list of all the medicines, herbs, non-prescription drugs, or dietary supplements you use. Also tell them if you smoke, drink alcohol, or use illegal drugs. Some items may interact with your medicine. What should I watch for while using this medication? Your condition will be monitored carefully while you are receiving this medication. You may need blood work while taking this medication. This medication may make you feel generally unwell. This is not uncommon as chemotherapy can affect healthy cells as well as cancer cells. Report any side effects. Continue your course of treatment even though you feel ill unless your care team tells you to stop. This medication can cause serious allergic reactions. To reduce the risk, your care team may give you other medications to take before receiving this one. Be sure to follow the directions from your care team. This medication may increase your risk of getting an infection. Call your care team for advice if you get a fever, chills, sore throat, or other symptoms of a cold or flu. Do not treat yourself. Try to avoid  being around people who are sick. This medication may increase your risk to bruise or bleed. Call your care team if you notice any unusual bleeding. Be careful brushing or flossing your teeth or using a toothpick because you may get an infection or bleed more easily. If you have any dental work done, tell your dentist you are receiving this medication. Talk to your care team if you may be pregnant. Serious birth defects can occur if you take this medication during pregnancy. Talk to your care team before breastfeeding. Changes to your treatment plan may be needed. What side effects may I notice from receiving this medication? Side effects that you should report to your care team as soon as possible: Allergic reactions--skin rash, itching, hives, swelling of the face, lips, tongue, or throat Heart rhythm changes--fast or irregular heartbeat, dizziness, feeling faint or lightheaded, chest pain, trouble breathing Increase in blood pressure Infection--fever, chills, cough, sore throat, wounds that don't heal, pain or trouble when passing urine, general feeling of discomfort or being unwell Low blood pressure--dizziness, feeling faint or lightheaded, blurry vision Low red blood cell level--unusual weakness or fatigue, dizziness, headache, trouble breathing Painful swelling, warmth, or redness of the skin, blisters or sores at the infusion site Pain, tingling,  or numbness in the hands or feet Slow heartbeat--dizziness, feeling faint or lightheaded, confusion, trouble breathing, unusual weakness or fatigue Unusual bruising or bleeding Side effects that usually do not require medical attention (report to your care team if they continue or are bothersome): Diarrhea Hair loss Joint pain Loss of appetite Muscle pain Nausea Vomiting This list may not describe all possible side effects. Call your doctor for medical advice about side effects. You may report side effects to FDA at 1-800-FDA-1088. Where  should I keep my medication? This medication is given in a hospital or clinic. It will not be stored at home. NOTE: This sheet is a summary. It may not cover all possible information. If you have questions about this medicine, talk to your doctor, pharmacist, or health care provider.  2023 Elsevier/Gold Standard (2022-03-13 00:00:00)   Carboplatin Injection What is this medication? CARBOPLATIN (KAR boe pla tin) treats some types of cancer. It works by slowing down the growth of cancer cells. This medicine may be used for other purposes; ask your health care provider or pharmacist if you have questions. COMMON BRAND NAME(S): Paraplatin What should I tell my care team before I take this medication? They need to know if you have any of these conditions: Blood disorders Hearing problems Kidney disease Recent or ongoing radiation therapy An unusual or allergic reaction to carboplatin, cisplatin, other medications, foods, dyes, or preservatives Pregnant or trying to get pregnant Breast-feeding How should I use this medication? This medication is injected into a vein. It is given by your care team in a hospital or clinic setting. Talk to your care team about the use of this medication in children. Special care may be needed. Overdosage: If you think you have taken too much of this medicine contact a poison control center or emergency room at once. NOTE: This medicine is only for you. Do not share this medicine with others. What if I miss a dose? Keep appointments for follow-up doses. It is important not to miss your dose. Call your care team if you are unable to keep an appointment. What may interact with this medication? Medications for seizures Some antibiotics, such as amikacin, gentamicin, neomycin, streptomycin, tobramycin Vaccines This list may not describe all possible interactions. Give your health care provider a list of all the medicines, herbs, non-prescription drugs, or dietary  supplements you use. Also tell them if you smoke, drink alcohol, or use illegal drugs. Some items may interact with your medicine. What should I watch for while using this medication? Your condition will be monitored carefully while you are receiving this medication. You may need blood work while taking this medication. This medication may make you feel generally unwell. This is not uncommon, as chemotherapy can affect healthy cells as well as cancer cells. Report any side effects. Continue your course of treatment even though you feel ill unless your care team tells you to stop. In some cases, you may be given additional medications to help with side effects. Follow all directions for their use. This medication may increase your risk of getting an infection. Call your care team for advice if you get a fever, chills, sore throat, or other symptoms of a cold or flu. Do not treat yourself. Try to avoid being around people who are sick. Avoid taking medications that contain aspirin, acetaminophen, ibuprofen, naproxen, or ketoprofen unless instructed by your care team. These medications may hide a fever. Be careful brushing or flossing your teeth or using a toothpick because you  may get an infection or bleed more easily. If you have any dental work done, tell your dentist you are receiving this medication. Talk to your care team if you wish to become pregnant or think you might be pregnant. This medication can cause serious birth defects. Talk to your care team about effective forms of contraception. Do not breast-feed while taking this medication. What side effects may I notice from receiving this medication? Side effects that you should report to your care team as soon as possible: Allergic reactions--skin rash, itching, hives, swelling of the face, lips, tongue, or throat Infection--fever, chills, cough, sore throat, wounds that don't heal, pain or trouble when passing urine, general feeling of  discomfort or being unwell Low red blood cell level--unusual weakness or fatigue, dizziness, headache, trouble breathing Pain, tingling, or numbness in the hands or feet, muscle weakness, change in vision, confusion or trouble speaking, loss of balance or coordination, trouble walking, seizures Unusual bruising or bleeding Side effects that usually do not require medical attention (report to your care team if they continue or are bothersome): Hair loss Nausea Unusual weakness or fatigue Vomiting This list may not describe all possible side effects. Call your doctor for medical advice about side effects. You may report side effects to FDA at 1-800-FDA-1088. Where should I keep my medication? This medication is given in a hospital or clinic. It will not be stored at home. NOTE: This sheet is a summary. It may not cover all possible information. If you have questions about this medicine, talk to your doctor, pharmacist, or health care provider.  2023 Elsevier/Gold Standard (2022-02-25 00:00:00)

## 2023-01-10 ENCOUNTER — Ambulatory Visit
Admission: RE | Admit: 2023-01-10 | Discharge: 2023-01-10 | Disposition: A | Discharge status: Home / Self Care | Payer: PPO | Source: Ambulatory Visit | Attending: Radiation Oncology | Admitting: Radiation Oncology

## 2023-01-10 ENCOUNTER — Other Ambulatory Visit: Payer: Self-pay

## 2023-01-10 DIAGNOSIS — Z5111 Encounter for antineoplastic chemotherapy: Secondary | ICD-10-CM | POA: Diagnosis not present

## 2023-01-10 DIAGNOSIS — Z510 Encounter for antineoplastic radiation therapy: Secondary | ICD-10-CM | POA: Diagnosis not present

## 2023-01-10 DIAGNOSIS — C158 Malignant neoplasm of overlapping sites of esophagus: Secondary | ICD-10-CM | POA: Diagnosis not present

## 2023-01-10 LAB — RAD ONC ARIA SESSION SUMMARY
Course Elapsed Days: 8
Plan Fractions Treated to Date: 7
Plan Prescribed Dose Per Fraction: 1.8 Gy
Plan Total Fractions Prescribed: 25
Plan Total Prescribed Dose: 45 Gy
Reference Point Dosage Given to Date: 12.6 Gy
Reference Point Session Dosage Given: 1.8 Gy
Session Number: 7

## 2023-01-12 ENCOUNTER — Other Ambulatory Visit: Payer: Self-pay | Admitting: Oncology

## 2023-01-12 DIAGNOSIS — C158 Malignant neoplasm of overlapping sites of esophagus: Secondary | ICD-10-CM

## 2023-01-13 ENCOUNTER — Inpatient Hospital Stay: Payer: PPO | Admitting: Oncology

## 2023-01-13 ENCOUNTER — Ambulatory Visit
Admission: RE | Admit: 2023-01-13 | Discharge: 2023-01-13 | Disposition: A | Discharge status: Home / Self Care | Payer: PPO | Source: Ambulatory Visit | Attending: Radiation Oncology | Admitting: Radiation Oncology

## 2023-01-13 ENCOUNTER — Inpatient Hospital Stay: Payer: PPO

## 2023-01-13 ENCOUNTER — Other Ambulatory Visit: Payer: Self-pay

## 2023-01-13 DIAGNOSIS — C158 Malignant neoplasm of overlapping sites of esophagus: Secondary | ICD-10-CM | POA: Diagnosis not present

## 2023-01-13 DIAGNOSIS — Z510 Encounter for antineoplastic radiation therapy: Secondary | ICD-10-CM | POA: Diagnosis not present

## 2023-01-13 DIAGNOSIS — Z5111 Encounter for antineoplastic chemotherapy: Secondary | ICD-10-CM | POA: Diagnosis not present

## 2023-01-13 LAB — RAD ONC ARIA SESSION SUMMARY
Course Elapsed Days: 11
Plan Fractions Treated to Date: 8
Plan Prescribed Dose Per Fraction: 1.8 Gy
Plan Total Fractions Prescribed: 25
Plan Total Prescribed Dose: 45 Gy
Reference Point Dosage Given to Date: 14.4 Gy
Reference Point Session Dosage Given: 1.8 Gy
Session Number: 8

## 2023-01-14 ENCOUNTER — Other Ambulatory Visit: Payer: Self-pay

## 2023-01-14 ENCOUNTER — Ambulatory Visit
Admission: RE | Admit: 2023-01-14 | Discharge: 2023-01-14 | Disposition: A | Discharge status: Home / Self Care | Payer: PPO | Source: Ambulatory Visit | Attending: Radiation Oncology | Admitting: Radiation Oncology

## 2023-01-14 DIAGNOSIS — Z510 Encounter for antineoplastic radiation therapy: Secondary | ICD-10-CM | POA: Diagnosis not present

## 2023-01-14 DIAGNOSIS — Z5111 Encounter for antineoplastic chemotherapy: Secondary | ICD-10-CM | POA: Diagnosis not present

## 2023-01-14 DIAGNOSIS — C158 Malignant neoplasm of overlapping sites of esophagus: Secondary | ICD-10-CM | POA: Diagnosis not present

## 2023-01-14 LAB — RAD ONC ARIA SESSION SUMMARY
Course Elapsed Days: 12
Plan Fractions Treated to Date: 9
Plan Prescribed Dose Per Fraction: 1.8 Gy
Plan Total Fractions Prescribed: 25
Plan Total Prescribed Dose: 45 Gy
Reference Point Dosage Given to Date: 16.2 Gy
Reference Point Session Dosage Given: 1.8 Gy
Session Number: 9

## 2023-01-15 ENCOUNTER — Ambulatory Visit
Admission: RE | Admit: 2023-01-15 | Discharge: 2023-01-15 | Disposition: A | Discharge status: Home / Self Care | Payer: PPO | Source: Ambulatory Visit | Attending: Radiation Oncology | Admitting: Radiation Oncology

## 2023-01-15 ENCOUNTER — Other Ambulatory Visit: Payer: Self-pay

## 2023-01-15 DIAGNOSIS — Z510 Encounter for antineoplastic radiation therapy: Secondary | ICD-10-CM | POA: Diagnosis not present

## 2023-01-15 DIAGNOSIS — Z5111 Encounter for antineoplastic chemotherapy: Secondary | ICD-10-CM | POA: Diagnosis not present

## 2023-01-15 DIAGNOSIS — C158 Malignant neoplasm of overlapping sites of esophagus: Secondary | ICD-10-CM | POA: Diagnosis not present

## 2023-01-15 LAB — RAD ONC ARIA SESSION SUMMARY
Course Elapsed Days: 13
Plan Fractions Treated to Date: 10
Plan Prescribed Dose Per Fraction: 1.8 Gy
Plan Total Fractions Prescribed: 25
Plan Total Prescribed Dose: 45 Gy
Reference Point Dosage Given to Date: 18 Gy
Reference Point Session Dosage Given: 1.8 Gy
Session Number: 10

## 2023-01-16 ENCOUNTER — Inpatient Hospital Stay: Payer: PPO

## 2023-01-16 ENCOUNTER — Other Ambulatory Visit: Payer: Self-pay

## 2023-01-16 ENCOUNTER — Ambulatory Visit
Admission: RE | Admit: 2023-01-16 | Discharge: 2023-01-16 | Disposition: A | Discharge status: Home / Self Care | Payer: PPO | Source: Ambulatory Visit | Attending: Radiation Oncology | Admitting: Radiation Oncology

## 2023-01-16 VITALS — BP 147/82 | HR 96 | Temp 98.1°F | Resp 18 | Ht 63.0 in | Wt 103.6 lb

## 2023-01-16 DIAGNOSIS — Z510 Encounter for antineoplastic radiation therapy: Secondary | ICD-10-CM | POA: Diagnosis not present

## 2023-01-16 DIAGNOSIS — Z5111 Encounter for antineoplastic chemotherapy: Secondary | ICD-10-CM | POA: Diagnosis not present

## 2023-01-16 DIAGNOSIS — C158 Malignant neoplasm of overlapping sites of esophagus: Secondary | ICD-10-CM | POA: Diagnosis not present

## 2023-01-16 LAB — CMP (CANCER CENTER ONLY)
ALT: 8 U/L (ref 0–44)
AST: 11 U/L — ABNORMAL LOW (ref 15–41)
Albumin: 3.7 g/dL (ref 3.5–5.0)
Alkaline Phosphatase: 50 U/L (ref 38–126)
Anion gap: 4 — ABNORMAL LOW (ref 5–15)
BUN: 11 mg/dL (ref 8–23)
CO2: 30 mmol/L (ref 22–32)
Calcium: 9.1 mg/dL (ref 8.9–10.3)
Chloride: 107 mmol/L (ref 98–111)
Creatinine: 0.62 mg/dL (ref 0.44–1.00)
GFR, Estimated: >60 mL/min (ref >60)
Glucose, Bld: 99 mg/dL (ref 70–99)
Potassium: 4.3 mmol/L (ref 3.5–5.1)
Sodium: 141 mmol/L (ref 135–145)
Total Bilirubin: 0.5 mg/dL (ref 0.3–1.2)
Total Protein: 6.5 g/dL (ref 6.5–8.1)

## 2023-01-16 LAB — RAD ONC ARIA SESSION SUMMARY
Course Elapsed Days: 14
Plan Fractions Treated to Date: 11
Plan Prescribed Dose Per Fraction: 1.8 Gy
Plan Total Fractions Prescribed: 25
Plan Total Prescribed Dose: 45 Gy
Reference Point Dosage Given to Date: 19.8 Gy
Reference Point Session Dosage Given: 1.8 Gy
Session Number: 11

## 2023-01-16 LAB — CBC WITH DIFFERENTIAL (CANCER CENTER ONLY)
Abs Immature Granulocytes: 0.03 10*3/uL (ref 0.00–0.07)
Basophils Absolute: 0 10*3/uL (ref 0.0–0.1)
Basophils Relative: 1 %
Eosinophils Absolute: 0.1 10*3/uL (ref 0.0–0.5)
Eosinophils Relative: 3 %
HCT: 37.3 % (ref 36.0–46.0)
Hemoglobin: 12.4 g/dL (ref 12.0–15.0)
Immature Granulocytes: 1 %
Lymphocytes Relative: 16 %
Lymphs Abs: 0.5 10*3/uL — ABNORMAL LOW (ref 0.7–4.0)
MCH: 30.5 pg (ref 26.0–34.0)
MCHC: 33.2 g/dL (ref 30.0–36.0)
MCV: 91.6 fL (ref 80.0–100.0)
Monocytes Absolute: 0.3 10*3/uL (ref 0.1–1.0)
Monocytes Relative: 8 %
Neutro Abs: 2.4 10*3/uL (ref 1.7–7.7)
Neutrophils Relative %: 71 %
Platelet Count: 279 10*3/uL (ref 150–400)
RBC: 4.07 MIL/uL (ref 3.87–5.11)
RDW: 12.4 % (ref 11.5–15.5)
WBC Count: 3.4 10*3/uL — ABNORMAL LOW (ref 4.0–10.5)
nRBC: 0 % (ref 0.0–0.2)

## 2023-01-16 MED ORDER — SODIUM CHLORIDE 0.9 % IV SOLN: Freq: Once | INTRAVENOUS | Status: AC

## 2023-01-16 MED ORDER — PALONOSETRON HCL INJECTION 0.25 MG/5ML
0.2500 mg | Freq: Once | INTRAVENOUS | Status: AC
  Administered 2023-01-16: 0.25 mg via INTRAVENOUS
  Filled 2023-01-16: qty 5

## 2023-01-16 MED ORDER — SODIUM CHLORIDE 0.9 % IV SOLN
121.0000 mg | Freq: Once | INTRAVENOUS | Status: AC
  Administered 2023-01-16: 120 mg via INTRAVENOUS
  Filled 2023-01-16: qty 12

## 2023-01-16 MED ORDER — FAMOTIDINE IN NACL 20-0.9 MG/50ML-% IV SOLN
20.0000 mg | Freq: Once | INTRAVENOUS | Status: AC
  Administered 2023-01-16: 20 mg via INTRAVENOUS
  Filled 2023-01-16: qty 50

## 2023-01-16 MED ORDER — DIPHENHYDRAMINE HCL 50 MG/ML IJ SOLN
25.0000 mg | Freq: Once | INTRAMUSCULAR | Status: AC
  Administered 2023-01-16: 25 mg via INTRAVENOUS
  Filled 2023-01-16: qty 1

## 2023-01-16 MED ORDER — SODIUM CHLORIDE 0.9 % IV SOLN
10.0000 mg | Freq: Once | INTRAVENOUS | Status: AC
  Administered 2023-01-16: 10 mg via INTRAVENOUS
  Filled 2023-01-16: qty 1

## 2023-01-16 MED ORDER — SODIUM CHLORIDE 0.9 % IV SOLN
50.0000 mg/m2 | Freq: Once | INTRAVENOUS | Status: AC
  Administered 2023-01-16: 72 mg via INTRAVENOUS
  Filled 2023-01-16: qty 12

## 2023-01-16 NOTE — Patient Instructions (Signed)
[facility]   Discharge Instructions: Thank you for choosing [facility] to provide your oncology and hematology care.   If you have a lab appointment with the [facility], please go directly to the [facility] and check in at the registration area.   Wear comfortable clothing and clothing appropriate for easy access to any Portacath or PICC line.   We strive to give you quality time with your provider. You may need to reschedule your appointment if you arrive late (15 or more minutes).  Arriving late affects you and other patients whose appointments are after yours.  Also, if you miss three or more appointments without notifying the office, you may be dismissed from the clinic at the provider's discretion.      For prescription refill requests, have your pharmacy contact our office and allow 72 hours for refills to be completed.    Today you received the following chemotherapy and/or immunotherapy agents Paclitaxel (TAXOL) & Carboplatin (PARAPLATIN).      To help prevent nausea and vomiting after your treatment, we encourage you to take your nausea medication as directed.  BELOW ARE SYMPTOMS THAT SHOULD BE REPORTED IMMEDIATELY: *FEVER GREATER THAN 100.4 F (38 C) OR HIGHER *CHILLS OR SWEATING *NAUSEA AND VOMITING THAT IS NOT CONTROLLED WITH YOUR NAUSEA MEDICATION *UNUSUAL SHORTNESS OF BREATH *UNUSUAL BRUISING OR BLEEDING *URINARY PROBLEMS (pain or burning when urinating, or frequent urination) *BOWEL PROBLEMS (unusual diarrhea, constipation, pain near the anus) TENDERNESS IN MOUTH AND THROAT WITH OR WITHOUT PRESENCE OF ULCERS (sore throat, sores in mouth, or a toothache) UNUSUAL RASH, SWELLING OR PAIN  UNUSUAL VAGINAL DISCHARGE OR ITCHING   Items with * indicate a potential emergency and should be followed up as soon as possible or go to the Emergency Department if any problems should occur.  Please show the CHEMOTHERAPY ALERT CARD  or IMMUNOTHERAPY ALERT CARD at check-in to the Emergency Department and triage nurse.  Should you have questions after your visit or need to cancel or reschedule your appointment, please contact [facility]  Dept: [phone]  and follow the prompts.  Office hours are 8:00 a.m. to 4:30 p.m. Monday - Friday. Please note that voicemails left after 4:00 p.m. may not be returned until the following business day.  We are closed weekends and major holidays. You have access to a nurse at all times for urgent questions. Please call the main number to the clinic Dept: [phone] and follow the prompts.   For any non-urgent questions, you may also contact your provider using MyChart. We now offer e-Visits for anyone 8 and older to request care online for non-urgent symptoms. For details visit [email or web address].   Also download the MyChart app! Go to the app store, search "MyChart", open the app, select [facility], and log in with your MyChart username and password.  Paclitaxel Injection What is this medication? PACLITAXEL (PAK li TAX el) treats some types of cancer. It works by slowing down the growth of cancer cells. This medicine may be used for other purposes; ask your health care provider or pharmacist if you have questions. COMMON BRAND NAME(S): Onxol, Taxol What should I tell my care team before I take this medication? They need to know if you have any of these conditions: Heart disease Liver disease Low white blood cell levels An unusual or allergic reaction to paclitaxel, other medications, foods, dyes, or preservatives If you or your partner are pregnant or  trying to get pregnant Breast-feeding How should I use this medication? This medication is injected into a vein. It is given by your care team in a hospital or clinic setting. Talk to your care team about the use of this medication in children. While it may be given to children for selected  conditions, precautions do apply. Overdosage: If you think you have taken too much of this medicine contact a poison control center or emergency room at once. NOTE: This medicine is only for you. Do not share this medicine with others. What if I miss a dose? Keep appointments for follow-up doses. It is important not to miss your dose. Call your care team if you are unable to keep an appointment. What may interact with this medication? Do not take this medication with any of the following: Live virus vaccines Other medications may affect the way this medication works. Talk with your care team about all of the medications you take. They may suggest changes to your treatment plan to lower the risk of side effects and to make sure your medications work as intended. This list may not describe all possible interactions. Give your health care provider a list of all the medicines, herbs, non-prescription drugs, or dietary supplements you use. Also tell them if you smoke, drink alcohol, or use illegal drugs. Some items may interact with your medicine. What should I watch for while using this medication? Your condition will be monitored carefully while you are receiving this medication. You may need blood work while taking this medication. This medication may make you feel generally unwell. This is not uncommon as chemotherapy can affect healthy cells as well as cancer cells. Report any side effects. Continue your course of treatment even though you feel ill unless your care team tells you to stop. This medication can cause serious allergic reactions. To reduce the risk, your care team may give you other medications to take before receiving this one. Be sure to follow the directions from your care team. This medication may increase your risk of getting an infection. Call your care team for advice if you get a fever, chills, sore throat, or other symptoms of a cold or flu. Do not treat yourself. Try to avoid  being around people who are sick. This medication may increase your risk to bruise or bleed. Call your care team if you notice any unusual bleeding. Be careful brushing or flossing your teeth or using a toothpick because you may get an infection or bleed more easily. If you have any dental work done, tell your dentist you are receiving this medication. Talk to your care team if you may be pregnant. Serious birth defects can occur if you take this medication during pregnancy. Talk to your care team before breastfeeding. Changes to your treatment plan may be needed. What side effects may I notice from receiving this medication? Side effects that you should report to your care team as soon as possible: Allergic reactions--skin rash, itching, hives, swelling of the face, lips, tongue, or throat Heart rhythm changes--fast or irregular heartbeat, dizziness, feeling faint or lightheaded, chest pain, trouble breathing Increase in blood pressure Infection--fever, chills, cough, sore throat, wounds that don't heal, pain or trouble when passing urine, general feeling of discomfort or being unwell Low blood pressure--dizziness, feeling faint or lightheaded, blurry vision Low red blood cell level--unusual weakness or fatigue, dizziness, headache, trouble breathing Painful swelling, warmth, or redness of the skin, blisters or sores at the infusion site Pain, tingling,  or numbness in the hands or feet Slow heartbeat--dizziness, feeling faint or lightheaded, confusion, trouble breathing, unusual weakness or fatigue Unusual bruising or bleeding Side effects that usually do not require medical attention (report to your care team if they continue or are bothersome): Diarrhea Hair loss Joint pain Loss of appetite Muscle pain Nausea Vomiting This list may not describe all possible side effects. Call your doctor for medical advice about side effects. You may report side effects to FDA at 1-800-FDA-1088. Where  should I keep my medication? This medication is given in a hospital or clinic. It will not be stored at home. NOTE: This sheet is a summary. It may not cover all possible information. If you have questions about this medicine, talk to your doctor, pharmacist, or health care provider.  2023 Elsevier/Gold Standard (2022-03-13 00:00:00)   Carboplatin Injection What is this medication? CARBOPLATIN (KAR boe pla tin) treats some types of cancer. It works by slowing down the growth of cancer cells. This medicine may be used for other purposes; ask your health care provider or pharmacist if you have questions. COMMON BRAND NAME(S): Paraplatin What should I tell my care team before I take this medication? They need to know if you have any of these conditions: Blood disorders Hearing problems Kidney disease Recent or ongoing radiation therapy An unusual or allergic reaction to carboplatin, cisplatin, other medications, foods, dyes, or preservatives Pregnant or trying to get pregnant Breast-feeding How should I use this medication? This medication is injected into a vein. It is given by your care team in a hospital or clinic setting. Talk to your care team about the use of this medication in children. Special care may be needed. Overdosage: If you think you have taken too much of this medicine contact a poison control center or emergency room at once. NOTE: This medicine is only for you. Do not share this medicine with others. What if I miss a dose? Keep appointments for follow-up doses. It is important not to miss your dose. Call your care team if you are unable to keep an appointment. What may interact with this medication? Medications for seizures Some antibiotics, such as amikacin, gentamicin, neomycin, streptomycin, tobramycin Vaccines This list may not describe all possible interactions. Give your health care provider a list of all the medicines, herbs, non-prescription drugs, or dietary  supplements you use. Also tell them if you smoke, drink alcohol, or use illegal drugs. Some items may interact with your medicine. What should I watch for while using this medication? Your condition will be monitored carefully while you are receiving this medication. You may need blood work while taking this medication. This medication may make you feel generally unwell. This is not uncommon, as chemotherapy can affect healthy cells as well as cancer cells. Report any side effects. Continue your course of treatment even though you feel ill unless your care team tells you to stop. In some cases, you may be given additional medications to help with side effects. Follow all directions for their use. This medication may increase your risk of getting an infection. Call your care team for advice if you get a fever, chills, sore throat, or other symptoms of a cold or flu. Do not treat yourself. Try to avoid being around people who are sick. Avoid taking medications that contain aspirin, acetaminophen, ibuprofen, naproxen, or ketoprofen unless instructed by your care team. These medications may hide a fever. Be careful brushing or flossing your teeth or using a toothpick because you  may get an infection or bleed more easily. If you have any dental work done, tell your dentist you are receiving this medication. Talk to your care team if you wish to become pregnant or think you might be pregnant. This medication can cause serious birth defects. Talk to your care team about effective forms of contraception. Do not breast-feed while taking this medication. What side effects may I notice from receiving this medication? Side effects that you should report to your care team as soon as possible: Allergic reactions--skin rash, itching, hives, swelling of the face, lips, tongue, or throat Infection--fever, chills, cough, sore throat, wounds that don't heal, pain or trouble when passing urine, general feeling of  discomfort or being unwell Low red blood cell level--unusual weakness or fatigue, dizziness, headache, trouble breathing Pain, tingling, or numbness in the hands or feet, muscle weakness, change in vision, confusion or trouble speaking, loss of balance or coordination, trouble walking, seizures Unusual bruising or bleeding Side effects that usually do not require medical attention (report to your care team if they continue or are bothersome): Hair loss Nausea Unusual weakness or fatigue Vomiting This list may not describe all possible side effects. Call your doctor for medical advice about side effects. You may report side effects to FDA at 1-800-FDA-1088. Where should I keep my medication? This medication is given in a hospital or clinic. It will not be stored at home. NOTE: This sheet is a summary. It may not cover all possible information. If you have questions about this medicine, talk to your doctor, pharmacist, or health care provider.  2023 Elsevier/Gold Standard (2022-02-25 00:00:00)

## 2023-01-17 ENCOUNTER — Ambulatory Visit
Admission: RE | Admit: 2023-01-17 | Discharge: 2023-01-17 | Disposition: A | Discharge status: Home / Self Care | Payer: PPO | Source: Ambulatory Visit | Attending: Radiation Oncology | Admitting: Radiation Oncology

## 2023-01-17 ENCOUNTER — Other Ambulatory Visit: Payer: Self-pay

## 2023-01-17 DIAGNOSIS — Z5111 Encounter for antineoplastic chemotherapy: Secondary | ICD-10-CM | POA: Diagnosis not present

## 2023-01-17 DIAGNOSIS — C158 Malignant neoplasm of overlapping sites of esophagus: Secondary | ICD-10-CM

## 2023-01-17 DIAGNOSIS — Z510 Encounter for antineoplastic radiation therapy: Secondary | ICD-10-CM | POA: Diagnosis not present

## 2023-01-17 LAB — RAD ONC ARIA SESSION SUMMARY
Course Elapsed Days: 15
Plan Fractions Treated to Date: 12
Plan Prescribed Dose Per Fraction: 1.8 Gy
Plan Total Fractions Prescribed: 25
Plan Total Prescribed Dose: 45 Gy
Reference Point Dosage Given to Date: 21.6 Gy
Reference Point Session Dosage Given: 1.8 Gy
Session Number: 12

## 2023-01-17 MED ORDER — SONAFINE EX EMUL
1.0000 | Freq: Two times a day (BID) | CUTANEOUS | Status: DC
  Administered 2023-01-17: 1 via TOPICAL

## 2023-01-19 ENCOUNTER — Other Ambulatory Visit: Payer: Self-pay | Admitting: Oncology

## 2023-01-19 DIAGNOSIS — C158 Malignant neoplasm of overlapping sites of esophagus: Secondary | ICD-10-CM

## 2023-01-20 ENCOUNTER — Inpatient Hospital Stay: Payer: PPO

## 2023-01-20 ENCOUNTER — Ambulatory Visit
Admission: RE | Admit: 2023-01-20 | Discharge: 2023-01-20 | Disposition: A | Discharge status: Home / Self Care | Payer: PPO | Source: Ambulatory Visit | Attending: Radiation Oncology | Admitting: Radiation Oncology

## 2023-01-20 ENCOUNTER — Other Ambulatory Visit: Payer: Self-pay

## 2023-01-20 DIAGNOSIS — C158 Malignant neoplasm of overlapping sites of esophagus: Secondary | ICD-10-CM | POA: Diagnosis not present

## 2023-01-20 DIAGNOSIS — Z510 Encounter for antineoplastic radiation therapy: Secondary | ICD-10-CM | POA: Diagnosis not present

## 2023-01-20 DIAGNOSIS — Z5111 Encounter for antineoplastic chemotherapy: Secondary | ICD-10-CM | POA: Diagnosis not present

## 2023-01-20 LAB — RAD ONC ARIA SESSION SUMMARY
Course Elapsed Days: 18
Plan Fractions Treated to Date: 13
Plan Prescribed Dose Per Fraction: 1.8 Gy
Plan Total Fractions Prescribed: 25
Plan Total Prescribed Dose: 45 Gy
Reference Point Dosage Given to Date: 23.4 Gy
Reference Point Session Dosage Given: 1.8 Gy
Session Number: 13

## 2023-01-21 ENCOUNTER — Ambulatory Visit
Admission: RE | Admit: 2023-01-21 | Discharge: 2023-01-21 | Disposition: A | Discharge status: Home / Self Care | Payer: PPO | Source: Ambulatory Visit | Attending: Radiation Oncology | Admitting: Radiation Oncology

## 2023-01-21 ENCOUNTER — Other Ambulatory Visit: Payer: Self-pay

## 2023-01-21 DIAGNOSIS — Z5111 Encounter for antineoplastic chemotherapy: Secondary | ICD-10-CM | POA: Diagnosis not present

## 2023-01-21 DIAGNOSIS — Z510 Encounter for antineoplastic radiation therapy: Secondary | ICD-10-CM | POA: Diagnosis not present

## 2023-01-21 DIAGNOSIS — C158 Malignant neoplasm of overlapping sites of esophagus: Secondary | ICD-10-CM | POA: Diagnosis not present

## 2023-01-21 LAB — RAD ONC ARIA SESSION SUMMARY
Course Elapsed Days: 19
Plan Fractions Treated to Date: 14
Plan Prescribed Dose Per Fraction: 1.8 Gy
Plan Total Fractions Prescribed: 25
Plan Total Prescribed Dose: 45 Gy
Reference Point Dosage Given to Date: 25.2 Gy
Reference Point Session Dosage Given: 1.8 Gy
Session Number: 14

## 2023-01-22 ENCOUNTER — Ambulatory Visit
Admission: RE | Admit: 2023-01-22 | Discharge: 2023-01-22 | Disposition: A | Discharge status: Home / Self Care | Payer: PPO | Source: Ambulatory Visit | Attending: Radiation Oncology | Admitting: Radiation Oncology

## 2023-01-22 ENCOUNTER — Other Ambulatory Visit: Payer: Self-pay

## 2023-01-22 DIAGNOSIS — Z510 Encounter for antineoplastic radiation therapy: Secondary | ICD-10-CM | POA: Diagnosis not present

## 2023-01-22 DIAGNOSIS — C158 Malignant neoplasm of overlapping sites of esophagus: Secondary | ICD-10-CM | POA: Diagnosis not present

## 2023-01-22 DIAGNOSIS — Z5111 Encounter for antineoplastic chemotherapy: Secondary | ICD-10-CM | POA: Diagnosis not present

## 2023-01-22 LAB — RAD ONC ARIA SESSION SUMMARY
Course Elapsed Days: 20
Plan Fractions Treated to Date: 15
Plan Prescribed Dose Per Fraction: 1.8 Gy
Plan Total Fractions Prescribed: 25
Plan Total Prescribed Dose: 45 Gy
Reference Point Dosage Given to Date: 27 Gy
Reference Point Session Dosage Given: 1.8 Gy
Session Number: 15

## 2023-01-23 ENCOUNTER — Inpatient Hospital Stay: Payer: PPO

## 2023-01-23 ENCOUNTER — Ambulatory Visit
Admission: RE | Admit: 2023-01-23 | Discharge: 2023-01-23 | Disposition: A | Discharge status: Home / Self Care | Payer: PPO | Source: Ambulatory Visit | Attending: Radiation Oncology | Admitting: Radiation Oncology

## 2023-01-23 ENCOUNTER — Other Ambulatory Visit: Payer: Self-pay

## 2023-01-23 ENCOUNTER — Encounter: Payer: Self-pay | Admitting: Nurse Practitioner

## 2023-01-23 ENCOUNTER — Inpatient Hospital Stay (HOSPITAL_BASED_OUTPATIENT_CLINIC_OR_DEPARTMENT_OTHER): Payer: PPO | Admitting: Nurse Practitioner

## 2023-01-23 VITALS — BP 109/61 | HR 95 | Temp 98.1°F | Resp 18 | Ht 63.0 in | Wt 104.6 lb

## 2023-01-23 VITALS — BP 132/68 | HR 94

## 2023-01-23 DIAGNOSIS — C158 Malignant neoplasm of overlapping sites of esophagus: Secondary | ICD-10-CM

## 2023-01-23 DIAGNOSIS — Z510 Encounter for antineoplastic radiation therapy: Secondary | ICD-10-CM | POA: Diagnosis not present

## 2023-01-23 DIAGNOSIS — Z5111 Encounter for antineoplastic chemotherapy: Secondary | ICD-10-CM | POA: Diagnosis not present

## 2023-01-23 LAB — RAD ONC ARIA SESSION SUMMARY
Course Elapsed Days: 21
Plan Fractions Treated to Date: 16
Plan Prescribed Dose Per Fraction: 1.8 Gy
Plan Total Fractions Prescribed: 25
Plan Total Prescribed Dose: 45 Gy
Reference Point Dosage Given to Date: 28.8 Gy
Reference Point Session Dosage Given: 1.8 Gy
Session Number: 16

## 2023-01-23 LAB — CMP (CANCER CENTER ONLY)
ALT: 7 U/L (ref 0–44)
AST: 13 U/L — ABNORMAL LOW (ref 15–41)
Albumin: 3.7 g/dL (ref 3.5–5.0)
Alkaline Phosphatase: 47 U/L (ref 38–126)
Anion gap: 6 (ref 5–15)
BUN: 13 mg/dL (ref 8–23)
CO2: 29 mmol/L (ref 22–32)
Calcium: 8.6 mg/dL — ABNORMAL LOW (ref 8.9–10.3)
Chloride: 105 mmol/L (ref 98–111)
Creatinine: 0.56 mg/dL (ref 0.44–1.00)
GFR, Estimated: >60 mL/min (ref >60)
Glucose, Bld: 86 mg/dL (ref 70–99)
Potassium: 3.7 mmol/L (ref 3.5–5.1)
Sodium: 140 mmol/L (ref 135–145)
Total Bilirubin: 0.5 mg/dL (ref 0.3–1.2)
Total Protein: 5.7 g/dL — ABNORMAL LOW (ref 6.5–8.1)

## 2023-01-23 LAB — CBC WITH DIFFERENTIAL (CANCER CENTER ONLY)
Abs Immature Granulocytes: 0.03 10*3/uL (ref 0.00–0.07)
Basophils Absolute: 0 10*3/uL (ref 0.0–0.1)
Basophils Relative: 1 %
Eosinophils Absolute: 0.1 10*3/uL (ref 0.0–0.5)
Eosinophils Relative: 5 %
HCT: 34.5 % — ABNORMAL LOW (ref 36.0–46.0)
Hemoglobin: 11.6 g/dL — ABNORMAL LOW (ref 12.0–15.0)
Immature Granulocytes: 1 %
Lymphocytes Relative: 15 %
Lymphs Abs: 0.5 10*3/uL — ABNORMAL LOW (ref 0.7–4.0)
MCH: 30.6 pg (ref 26.0–34.0)
MCHC: 33.6 g/dL (ref 30.0–36.0)
MCV: 91 fL (ref 80.0–100.0)
Monocytes Absolute: 0.3 10*3/uL (ref 0.1–1.0)
Monocytes Relative: 9 %
Neutro Abs: 2.1 10*3/uL (ref 1.7–7.7)
Neutrophils Relative %: 69 %
Platelet Count: 255 10*3/uL (ref 150–400)
RBC: 3.79 MIL/uL — ABNORMAL LOW (ref 3.87–5.11)
RDW: 12.9 % (ref 11.5–15.5)
WBC Count: 3.1 10*3/uL — ABNORMAL LOW (ref 4.0–10.5)
nRBC: 0 % (ref 0.0–0.2)

## 2023-01-23 MED ORDER — SODIUM CHLORIDE 0.9 % IV SOLN
121.0000 mg | Freq: Once | INTRAVENOUS | Status: AC
  Administered 2023-01-23: 120 mg via INTRAVENOUS
  Filled 2023-01-23: qty 12

## 2023-01-23 MED ORDER — PALONOSETRON HCL INJECTION 0.25 MG/5ML
0.2500 mg | Freq: Once | INTRAVENOUS | Status: AC
  Administered 2023-01-23: 0.25 mg via INTRAVENOUS
  Filled 2023-01-23: qty 5

## 2023-01-23 MED ORDER — DIPHENHYDRAMINE HCL 50 MG/ML IJ SOLN
25.0000 mg | Freq: Once | INTRAMUSCULAR | Status: AC
  Administered 2023-01-23: 25 mg via INTRAVENOUS
  Filled 2023-01-23: qty 1

## 2023-01-23 MED ORDER — FAMOTIDINE IN NACL 20-0.9 MG/50ML-% IV SOLN
20.0000 mg | Freq: Once | INTRAVENOUS | Status: AC
  Administered 2023-01-23: 20 mg via INTRAVENOUS
  Filled 2023-01-23: qty 50

## 2023-01-23 MED ORDER — SODIUM CHLORIDE 0.9 % IV SOLN
10.0000 mg | Freq: Once | INTRAVENOUS | Status: AC
  Administered 2023-01-23: 10 mg via INTRAVENOUS
  Filled 2023-01-23: qty 1

## 2023-01-23 MED ORDER — SODIUM CHLORIDE 0.9 % IV SOLN: Freq: Once | INTRAVENOUS | Status: AC

## 2023-01-23 MED ORDER — SODIUM CHLORIDE 0.9 % IV SOLN
50.0000 mg/m2 | Freq: Once | INTRAVENOUS | Status: AC
  Administered 2023-01-23: 72 mg via INTRAVENOUS
  Filled 2023-01-23: qty 12

## 2023-01-23 NOTE — Patient Instructions (Signed)
[facility]   Discharge Instructions: Thank you for choosing [facility] to provide your oncology and hematology care.   If you have a lab appointment with the [facility], please go directly to the [facility] and check in at the registration area.   Wear comfortable clothing and clothing appropriate for easy access to any Portacath or PICC line.   We strive to give you quality time with your provider. You may need to reschedule your appointment if you arrive late (15 or more minutes).  Arriving late affects you and other patients whose appointments are after yours.  Also, if you miss three or more appointments without notifying the office, you may be dismissed from the clinic at the provider's discretion.      For prescription refill requests, have your pharmacy contact our office and allow 72 hours for refills to be completed.    Today you received the following chemotherapy and/or immunotherapy agents Taxol, Carboplatin.      To help prevent nausea and vomiting after your treatment, we encourage you to take your nausea medication as directed.  BELOW ARE SYMPTOMS THAT SHOULD BE REPORTED IMMEDIATELY: *FEVER GREATER THAN 100.4 F (38 C) OR HIGHER *CHILLS OR SWEATING *NAUSEA AND VOMITING THAT IS NOT CONTROLLED WITH YOUR NAUSEA MEDICATION *UNUSUAL SHORTNESS OF BREATH *UNUSUAL BRUISING OR BLEEDING *URINARY PROBLEMS (pain or burning when urinating, or frequent urination) *BOWEL PROBLEMS (unusual diarrhea, constipation, pain near the anus) TENDERNESS IN MOUTH AND THROAT WITH OR WITHOUT PRESENCE OF ULCERS (sore throat, sores in mouth, or a toothache) UNUSUAL RASH, SWELLING OR PAIN  UNUSUAL VAGINAL DISCHARGE OR ITCHING   Items with * indicate a potential emergency and should be followed up as soon as possible or go to the Emergency Department if any problems should occur.  Please show the CHEMOTHERAPY ALERT CARD or IMMUNOTHERAPY ALERT CARD  at check-in to the Emergency Department and triage nurse.  Should you have questions after your visit or need to cancel or reschedule your appointment, please contact [facility]  Dept: [phone]  and follow the prompts.  Office hours are 8:00 a.m. to 4:30 p.m. Monday - Friday. Please note that voicemails left after 4:00 p.m. may not be returned until the following business day.  We are closed weekends and major holidays. You have access to a nurse at all times for urgent questions. Please call the main number to the clinic Dept: [phone] and follow the prompts.   For any non-urgent questions, you may also contact your provider using MyChart. We now offer e-Visits for anyone 42 and older to request care online for non-urgent symptoms. For details visit [email or web address].   Also download the MyChart app! Go to the app store, search "MyChart", open the app, select [facility], and log in with your MyChart username and password.  Paclitaxel Injection What is this medication? PACLITAXEL (PAK li TAX el) treats some types of cancer. It works by slowing down the growth of cancer cells. This medicine may be used for other purposes; ask your health care provider or pharmacist if you have questions. COMMON BRAND NAME(S): Onxol, Taxol What should I tell my care team before I take this medication? They need to know if you have any of these conditions: Heart disease Liver disease Low white blood cell levels An unusual or allergic reaction to paclitaxel, other medications, foods, dyes, or preservatives If you or your partner are pregnant or trying to get  pregnant Breast-feeding How should I use this medication? This medication is injected into a vein. It is given by your care team in a hospital or clinic setting. Talk to your care team about the use of this medication in children. While it may be given to children for selected conditions, precautions do  apply. Overdosage: If you think you have taken too much of this medicine contact a poison control center or emergency room at once. NOTE: This medicine is only for you. Do not share this medicine with others. What if I miss a dose? Keep appointments for follow-up doses. It is important not to miss your dose. Call your care team if you are unable to keep an appointment. What may interact with this medication? Do not take this medication with any of the following: Live virus vaccines Other medications may affect the way this medication works. Talk with your care team about all of the medications you take. They may suggest changes to your treatment plan to lower the risk of side effects and to make sure your medications work as intended. This list may not describe all possible interactions. Give your health care provider a list of all the medicines, herbs, non-prescription drugs, or dietary supplements you use. Also tell them if you smoke, drink alcohol, or use illegal drugs. Some items may interact with your medicine. What should I watch for while using this medication? Your condition will be monitored carefully while you are receiving this medication. You may need blood work while taking this medication. This medication may make you feel generally unwell. This is not uncommon as chemotherapy can affect healthy cells as well as cancer cells. Report any side effects. Continue your course of treatment even though you feel ill unless your care team tells you to stop. This medication can cause serious allergic reactions. To reduce the risk, your care team may give you other medications to take before receiving this one. Be sure to follow the directions from your care team. This medication may increase your risk of getting an infection. Call your care team for advice if you get a fever, chills, sore throat, or other symptoms of a cold or flu. Do not treat yourself. Try to avoid being around people who are  sick. This medication may increase your risk to bruise or bleed. Call your care team if you notice any unusual bleeding. Be careful brushing or flossing your teeth or using a toothpick because you may get an infection or bleed more easily. If you have any dental work done, tell your dentist you are receiving this medication. Talk to your care team if you may be pregnant. Serious birth defects can occur if you take this medication during pregnancy. Talk to your care team before breastfeeding. Changes to your treatment plan may be needed. What side effects may I notice from receiving this medication? Side effects that you should report to your care team as soon as possible: Allergic reactions--skin rash, itching, hives, swelling of the face, lips, tongue, or throat Heart rhythm changes--fast or irregular heartbeat, dizziness, feeling faint or lightheaded, chest pain, trouble breathing Increase in blood pressure Infection--fever, chills, cough, sore throat, wounds that don't heal, pain or trouble when passing urine, general feeling of discomfort or being unwell Low blood pressure--dizziness, feeling faint or lightheaded, blurry vision Low red blood cell level--unusual weakness or fatigue, dizziness, headache, trouble breathing Painful swelling, warmth, or redness of the skin, blisters or sores at the infusion site Pain, tingling, or numbness in  the hands or feet Slow heartbeat--dizziness, feeling faint or lightheaded, confusion, trouble breathing, unusual weakness or fatigue Unusual bruising or bleeding Side effects that usually do not require medical attention (report to your care team if they continue or are bothersome): Diarrhea Hair loss Joint pain Loss of appetite Muscle pain Nausea Vomiting This list may not describe all possible side effects. Call your doctor for medical advice about side effects. You may report side effects to FDA at 1-800-FDA-1088. Where should I keep my  medication? This medication is given in a hospital or clinic. It will not be stored at home. NOTE: This sheet is a summary. It may not cover all possible information. If you have questions about this medicine, talk to your doctor, pharmacist, or health care provider.  2023 Elsevier/Gold Standard (2022-03-28 00:00:00)  Carboplatin Injection What is this medication? CARBOPLATIN (KAR boe pla tin) treats some types of cancer. It works by slowing down the growth of cancer cells. This medicine may be used for other purposes; ask your health care provider or pharmacist if you have questions. COMMON BRAND NAME(S): Paraplatin What should I tell my care team before I take this medication? They need to know if you have any of these conditions: Blood disorders Hearing problems Kidney disease Recent or ongoing radiation therapy An unusual or allergic reaction to carboplatin, cisplatin, other medications, foods, dyes, or preservatives Pregnant or trying to get pregnant Breast-feeding How should I use this medication? This medication is injected into a vein. It is given by your care team in a hospital or clinic setting. Talk to your care team about the use of this medication in children. Special care may be needed. Overdosage: If you think you have taken too much of this medicine contact a poison control center or emergency room at once. NOTE: This medicine is only for you. Do not share this medicine with others. What if I miss a dose? Keep appointments for follow-up doses. It is important not to miss your dose. Call your care team if you are unable to keep an appointment. What may interact with this medication? Medications for seizures Some antibiotics, such as amikacin, gentamicin, neomycin, streptomycin, tobramycin Vaccines This list may not describe all possible interactions. Give your health care provider a list of all the medicines, herbs, non-prescription drugs, or dietary supplements you use.  Also tell them if you smoke, drink alcohol, or use illegal drugs. Some items may interact with your medicine. What should I watch for while using this medication? Your condition will be monitored carefully while you are receiving this medication. You may need blood work while taking this medication. This medication may make you feel generally unwell. This is not uncommon, as chemotherapy can affect healthy cells as well as cancer cells. Report any side effects. Continue your course of treatment even though you feel ill unless your care team tells you to stop. In some cases, you may be given additional medications to help with side effects. Follow all directions for their use. This medication may increase your risk of getting an infection. Call your care team for advice if you get a fever, chills, sore throat, or other symptoms of a cold or flu. Do not treat yourself. Try to avoid being around people who are sick. Avoid taking medications that contain aspirin, acetaminophen, ibuprofen, naproxen, or ketoprofen unless instructed by your care team. These medications may hide a fever. Be careful brushing or flossing your teeth or using a toothpick because you may get an infection  or bleed more easily. If you have any dental work done, tell your dentist you are receiving this medication. Talk to your care team if you wish to become pregnant or think you might be pregnant. This medication can cause serious birth defects. Talk to your care team about effective forms of contraception. Do not breast-feed while taking this medication. What side effects may I notice from receiving this medication? Side effects that you should report to your care team as soon as possible: Allergic reactions--skin rash, itching, hives, swelling of the face, lips, tongue, or throat Infection--fever, chills, cough, sore throat, wounds that don't heal, pain or trouble when passing urine, general feeling of discomfort or being  unwell Low red blood cell level--unusual weakness or fatigue, dizziness, headache, trouble breathing Pain, tingling, or numbness in the hands or feet, muscle weakness, change in vision, confusion or trouble speaking, loss of balance or coordination, trouble walking, seizures Unusual bruising or bleeding Side effects that usually do not require medical attention (report to your care team if they continue or are bothersome): Hair loss Nausea Unusual weakness or fatigue Vomiting This list may not describe all possible side effects. Call your doctor for medical advice about side effects. You may report side effects to FDA at 1-800-FDA-1088. Where should I keep my medication? This medication is given in a hospital or clinic. It will not be stored at home. NOTE: This sheet is a summary. It may not cover all possible information. If you have questions about this medicine, talk to your doctor, pharmacist, or health care provider.  2023 Elsevier/Gold Standard (2008-01-02 00:00:00)

## 2023-01-23 NOTE — Progress Notes (Signed)
Patient seen by Lisa Thomas, NP today  Vitals are within treatment parameters.   Labs reviewed by Lisa Thomas, NP and are within treatment parameters.  Per physician team, patient is ready for treatment and there are NO modifications to the treatment plan. 

## 2023-01-23 NOTE — Progress Notes (Signed)
[facility] OFFICE PROGRESS NOTE   Diagnosis: Esophagus cancer  INTERVAL HISTORY:   Jo Morrison returns as scheduled.  She continues radiation.  She completed week 3 Taxol/carboplatin 01/16/2023.  No signs of allergic reaction.  No nausea or vomiting.  No mouth sores.  She has intermittent mild constipation.  No diarrhea.  No change in baseline neuropathy symptoms which predated current chemotherapy.  She continues to have dysphagia but is maintaining her weight.  Objective:  Vital signs in last 24 hours:  Blood pressure 109/61, pulse 95, temperature 98.1 F (36.7 C), temperature source Oral, resp. rate 18, height '5\' 3"'$  (1.6 m), weight 104 lb 9.6 oz (47.4 kg), SpO2 100 %.    HEENT: No thrush or ulcers. Resp: Lungs clear bilaterally. Cardio: Regular rate and rhythm. GI: Abdomen soft and nontender.  No hepatosplenomegaly. Vascular: No leg edema. Skin: No rash.   Lab Results:  Lab Results  Component Value Date   WBC 3.1 (L) 01/23/2023   HGB 11.6 (L) 01/23/2023   HCT 34.5 (L) 01/23/2023   MCV 91.0 01/23/2023   PLT 255 01/23/2023   NEUTROABS 2.1 01/23/2023    Imaging:  No results found.  Medications: I have reviewed the patient's current medications.  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  For hypermetabolic metastatic lymph nodes, 3 in the mediastinum and 1 in the upper abdomen gastrohepatic ligament.  EUS 02/10/2020-ulcerated mass in the mid esophagus,uT3, 3 suspicious  lymph nodes near the primary tumor, malignant appearing lymph node at the gastrohepatic ligament (level 18)-metastatic squamous cell carcinoma Radiation 01/31/2020 Cycle 1 weekly Taxol/carboplatin 02/01/2020 Cycle 2 weekly Taxol/carboplatin 02/08/2020 Cycle 3 weekly Taxol/carboplatin 02/15/2020 Cycle 4 weekly Taxol/carboplatin 02/22/2020 Cycle 5 weekly Taxol/carboplatin 02/29/2020 Radiation completed 03/08/2020 CTs 04/03/2020-esophageal mass appears grossly smaller, regression of previously noted lymphadenopathy in the mediastinum and upper abdomen, nonocclusive pulmonary embolus right-sided pulmonary arteries Upper endoscopy 05/02/2020-treatment effect visible starting approximately 26 cm from the incisors.  Scope passed easily into the stomach and proximal duodenum.  Stomach and proximal duodenum grossly normal.  Scope withdrawn to the mid esophagus with four-quadrant biopsies obtained.  2 biopsy showed acute ulcerative esophagitis; 2 biopsies showed squamous mucosa with minimal histologic abnormality. CTs 07/24/2020-no evidence of metastatic disease, left gastric lymph node has decreased in size, mild wall thickening of the mid to distal esophagus Upper endoscopy 07/25/2020-multiple biopsies with changes of ulcerative esophagitis, negative fungal, HSV, and CMV stains, no evidence of malignancy CT[facility] at [facility] 11/27/2020-interval decrease in prior mediastinal groundglass opacities; decrease in now mild diffuse esophageal wall thickening, most prominent in the mid and distal esophagus; few scattered 3 mm and smaller noncalcified pulmonary nodules not significantly changed.  No definite new or enlarging pulmonary nodule.  No evidence of metastatic disease in the abdomen. EGD 11/28/2020-mild and discrete stricture in the distal esophagus; no mass lesions seen.  Biopsies taken, negative for malignancy x4; serial dilatation performed. CTs at [facility] 08/06/2021-no evidence of recurrent disease EGD 08/07/2021-no mass, mild stricture,  multiple biopsies negative for carcinoma EGD at [facility] 12/25/2021-mild stricture, no mass, biopsies negative for carcinoma, 1 biopsy with possible low-grade dysplasia CTs 12/24/2021-negative for recurrent  disease CTs at [facility] 07/01/2022-no evidence of recurrent disease Endoscopy 07/02/2022-negative for malignancy Progressive dysphagia/odynophagia status post EGD 12/17/2022-near complete obstruction UES, biopsy-moderately differentiated squamous cell carcinoma; PD-L1 CPS 45  PET scan at [facility] 12/18/2022-FDG avidity in the cervical esophagus.  No FDG avid metastatic disease. Radiation 01/02/2023 Cycle 1 weekly Taxol/carboplatin 01/02/2023 Cycle 2 weekly Taxol/carboplatin 01/09/2023 Cycle 3 weekly Taxol/carboplatin 01/16/2023 Cycle 4 weekly Taxol/carboplatin 01/23/2023 Right breast cancer July 1996, [name], [name], right lumpectomy and axillary dissection followed by adjuvant Adriamycin/Taxol chemotherapy-4 cycles, 5 years of tamoxifen, and breast radiation   3.   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  Depression   5.  Hypertension   6.  Right eye uveitis, macular edema, and corneal edema   7.  Asthma   8.  Pulmonary embolus on CT 04/03/2020-lovenox initiated, anticoagulation changed to apixaban beginning 08/03/2020, anticoagulation completed approximately the beginning of December 2021   9.  Odynophagia secondary to radiation -resolved  Disposition: Jo Morrison appears stable.  She continues radiation.  She has completed 3 weekly treatments with Taxol/carboplatin.  She is tolerating chemotherapy well.  Plan to proceed with week 4 today as scheduled.  CBC and chemistry panel reviewed.  Labs adequate to proceed as above.  She will return for lab, follow-up, Taxol/carboplatin in 1 week.    Ned Card ANP/GNP-BC   01/23/2023  11:15 AM

## 2023-01-24 ENCOUNTER — Other Ambulatory Visit: Payer: Self-pay

## 2023-01-24 ENCOUNTER — Ambulatory Visit
Admission: RE | Admit: 2023-01-24 | Discharge: 2023-01-24 | Disposition: A | Discharge status: Home / Self Care | Payer: PPO | Source: Ambulatory Visit | Attending: Radiation Oncology | Admitting: Radiation Oncology

## 2023-01-24 DIAGNOSIS — C158 Malignant neoplasm of overlapping sites of esophagus: Secondary | ICD-10-CM | POA: Insufficient documentation

## 2023-01-24 DIAGNOSIS — Z510 Encounter for antineoplastic radiation therapy: Secondary | ICD-10-CM | POA: Diagnosis not present

## 2023-01-24 LAB — RAD ONC ARIA SESSION SUMMARY
Course Elapsed Days: 22
Plan Fractions Treated to Date: 17
Plan Prescribed Dose Per Fraction: 1.8 Gy
Plan Total Fractions Prescribed: 25
Plan Total Prescribed Dose: 45 Gy
Reference Point Dosage Given to Date: 30.6 Gy
Reference Point Session Dosage Given: 1.8 Gy
Session Number: 17

## 2023-01-27 ENCOUNTER — Ambulatory Visit
Admission: RE | Admit: 2023-01-27 | Discharge: 2023-01-27 | Disposition: A | Discharge status: Home / Self Care | Payer: PPO | Source: Ambulatory Visit | Attending: Radiation Oncology | Admitting: Radiation Oncology

## 2023-01-27 ENCOUNTER — Other Ambulatory Visit: Payer: Self-pay

## 2023-01-27 DIAGNOSIS — C158 Malignant neoplasm of overlapping sites of esophagus: Secondary | ICD-10-CM | POA: Diagnosis not present

## 2023-01-27 DIAGNOSIS — Z510 Encounter for antineoplastic radiation therapy: Secondary | ICD-10-CM | POA: Diagnosis not present

## 2023-01-27 LAB — RAD ONC ARIA SESSION SUMMARY
Course Elapsed Days: 25
Plan Fractions Treated to Date: 18
Plan Prescribed Dose Per Fraction: 1.8 Gy
Plan Total Fractions Prescribed: 25
Plan Total Prescribed Dose: 45 Gy
Reference Point Dosage Given to Date: 32.4 Gy
Reference Point Session Dosage Given: 1.8 Gy
Session Number: 18

## 2023-01-28 ENCOUNTER — Other Ambulatory Visit: Payer: Self-pay

## 2023-01-28 ENCOUNTER — Ambulatory Visit
Admission: RE | Admit: 2023-01-28 | Discharge: 2023-01-28 | Disposition: A | Discharge status: Home / Self Care | Payer: PPO | Source: Ambulatory Visit | Attending: Radiation Oncology | Admitting: Radiation Oncology

## 2023-01-28 DIAGNOSIS — Z510 Encounter for antineoplastic radiation therapy: Secondary | ICD-10-CM | POA: Diagnosis not present

## 2023-01-28 DIAGNOSIS — C158 Malignant neoplasm of overlapping sites of esophagus: Secondary | ICD-10-CM | POA: Diagnosis not present

## 2023-01-28 LAB — RAD ONC ARIA SESSION SUMMARY
Course Elapsed Days: 26
Plan Fractions Treated to Date: 19
Plan Prescribed Dose Per Fraction: 1.8 Gy
Plan Total Fractions Prescribed: 25
Plan Total Prescribed Dose: 45 Gy
Reference Point Dosage Given to Date: 34.2 Gy
Reference Point Session Dosage Given: 1.8 Gy
Session Number: 19

## 2023-01-29 ENCOUNTER — Ambulatory Visit
Admission: RE | Admit: 2023-01-29 | Discharge: 2023-01-29 | Disposition: A | Discharge status: Home / Self Care | Payer: PPO | Source: Ambulatory Visit | Attending: Radiation Oncology | Admitting: Radiation Oncology

## 2023-01-29 ENCOUNTER — Other Ambulatory Visit: Payer: Self-pay

## 2023-01-29 DIAGNOSIS — C158 Malignant neoplasm of overlapping sites of esophagus: Secondary | ICD-10-CM | POA: Diagnosis not present

## 2023-01-29 DIAGNOSIS — Z510 Encounter for antineoplastic radiation therapy: Secondary | ICD-10-CM | POA: Diagnosis not present

## 2023-01-29 LAB — RAD ONC ARIA SESSION SUMMARY
Course Elapsed Days: 27
Plan Fractions Treated to Date: 20
Plan Prescribed Dose Per Fraction: 1.8 Gy
Plan Total Fractions Prescribed: 25
Plan Total Prescribed Dose: 45 Gy
Reference Point Dosage Given to Date: 36 Gy
Reference Point Session Dosage Given: 1.8 Gy
Session Number: 20

## 2023-01-30 ENCOUNTER — Ambulatory Visit
Admission: RE | Admit: 2023-01-30 | Discharge: 2023-01-30 | Disposition: A | Discharge status: Home / Self Care | Payer: PPO | Source: Ambulatory Visit | Attending: Radiation Oncology | Admitting: Radiation Oncology

## 2023-01-30 ENCOUNTER — Other Ambulatory Visit: Payer: Self-pay

## 2023-01-30 ENCOUNTER — Encounter: Payer: Self-pay | Admitting: *Deleted

## 2023-01-30 ENCOUNTER — Inpatient Hospital Stay (HOSPITAL_BASED_OUTPATIENT_CLINIC_OR_DEPARTMENT_OTHER): Payer: PPO | Admitting: Oncology

## 2023-01-30 ENCOUNTER — Inpatient Hospital Stay: Payer: PPO

## 2023-01-30 VITALS — BP 136/95 | HR 99

## 2023-01-30 DIAGNOSIS — C158 Malignant neoplasm of overlapping sites of esophagus: Secondary | ICD-10-CM

## 2023-01-30 DIAGNOSIS — Z5111 Encounter for antineoplastic chemotherapy: Secondary | ICD-10-CM | POA: Insufficient documentation

## 2023-01-30 DIAGNOSIS — Z923 Personal history of irradiation: Secondary | ICD-10-CM | POA: Insufficient documentation

## 2023-01-30 DIAGNOSIS — C771 Secondary and unspecified malignant neoplasm of intrathoracic lymph nodes: Secondary | ICD-10-CM | POA: Insufficient documentation

## 2023-01-30 DIAGNOSIS — Z86711 Personal history of pulmonary embolism: Secondary | ICD-10-CM | POA: Insufficient documentation

## 2023-01-30 DIAGNOSIS — J45909 Unspecified asthma, uncomplicated: Secondary | ICD-10-CM | POA: Insufficient documentation

## 2023-01-30 DIAGNOSIS — Z853 Personal history of malignant neoplasm of breast: Secondary | ICD-10-CM | POA: Insufficient documentation

## 2023-01-30 DIAGNOSIS — I10 Essential (primary) hypertension: Secondary | ICD-10-CM | POA: Insufficient documentation

## 2023-01-30 DIAGNOSIS — F32A Depression, unspecified: Secondary | ICD-10-CM | POA: Insufficient documentation

## 2023-01-30 DIAGNOSIS — Z510 Encounter for antineoplastic radiation therapy: Secondary | ICD-10-CM | POA: Diagnosis not present

## 2023-01-30 LAB — CMP (CANCER CENTER ONLY)
ALT: 9 U/L (ref 0–44)
AST: 13 U/L — ABNORMAL LOW (ref 15–41)
Albumin: 3.8 g/dL (ref 3.5–5.0)
Alkaline Phosphatase: 44 U/L (ref 38–126)
Anion gap: 7 (ref 5–15)
BUN: 11 mg/dL (ref 8–23)
CO2: 29 mmol/L (ref 22–32)
Calcium: 9.1 mg/dL (ref 8.9–10.3)
Chloride: 105 mmol/L (ref 98–111)
Creatinine: 0.69 mg/dL (ref 0.44–1.00)
GFR, Estimated: >60 mL/min (ref >60)
Glucose, Bld: 98 mg/dL (ref 70–99)
Potassium: 3.6 mmol/L (ref 3.5–5.1)
Sodium: 141 mmol/L (ref 135–145)
Total Bilirubin: 0.8 mg/dL (ref 0.3–1.2)
Total Protein: 6.3 g/dL — ABNORMAL LOW (ref 6.5–8.1)

## 2023-01-30 LAB — CBC WITH DIFFERENTIAL (CANCER CENTER ONLY)
Abs Immature Granulocytes: 0.03 10*3/uL (ref 0.00–0.07)
Basophils Absolute: 0 10*3/uL (ref 0.0–0.1)
Basophils Relative: 1 %
Eosinophils Absolute: 0.1 10*3/uL (ref 0.0–0.5)
Eosinophils Relative: 4 %
HCT: 34.8 % — ABNORMAL LOW (ref 36.0–46.0)
Hemoglobin: 11.9 g/dL — ABNORMAL LOW (ref 12.0–15.0)
Immature Granulocytes: 1 %
Lymphocytes Relative: 22 %
Lymphs Abs: 0.6 10*3/uL — ABNORMAL LOW (ref 0.7–4.0)
MCH: 31.1 pg (ref 26.0–34.0)
MCHC: 34.2 g/dL (ref 30.0–36.0)
MCV: 90.9 fL (ref 80.0–100.0)
Monocytes Absolute: 0.2 10*3/uL (ref 0.1–1.0)
Monocytes Relative: 8 %
Neutro Abs: 1.8 10*3/uL (ref 1.7–7.7)
Neutrophils Relative %: 64 %
Platelet Count: 183 10*3/uL (ref 150–400)
RBC: 3.83 MIL/uL — ABNORMAL LOW (ref 3.87–5.11)
RDW: 13.4 % (ref 11.5–15.5)
WBC Count: 2.8 10*3/uL — ABNORMAL LOW (ref 4.0–10.5)
nRBC: 0 % (ref 0.0–0.2)

## 2023-01-30 LAB — RAD ONC ARIA SESSION SUMMARY
Course Elapsed Days: 28
Plan Fractions Treated to Date: 21
Plan Prescribed Dose Per Fraction: 1.8 Gy
Plan Total Fractions Prescribed: 25
Plan Total Prescribed Dose: 45 Gy
Reference Point Dosage Given to Date: 37.8 Gy
Reference Point Session Dosage Given: 1.8 Gy
Session Number: 21

## 2023-01-30 MED ORDER — SODIUM CHLORIDE 0.9 % IV SOLN
50.0000 mg/m2 | Freq: Once | INTRAVENOUS | Status: AC
  Administered 2023-01-30: 72 mg via INTRAVENOUS
  Filled 2023-01-30: qty 12

## 2023-01-30 MED ORDER — SODIUM CHLORIDE 0.9 % IV SOLN
10.0000 mg | Freq: Once | INTRAVENOUS | Status: AC
  Administered 2023-01-30: 10 mg via INTRAVENOUS
  Filled 2023-01-30: qty 1

## 2023-01-30 MED ORDER — SODIUM CHLORIDE 0.9 % IV SOLN
120.0000 mg | Freq: Once | INTRAVENOUS | Status: AC
  Administered 2023-01-30: 120 mg via INTRAVENOUS
  Filled 2023-01-30: qty 12

## 2023-01-30 MED ORDER — FAMOTIDINE IN NACL 20-0.9 MG/50ML-% IV SOLN
20.0000 mg | Freq: Once | INTRAVENOUS | Status: AC
  Administered 2023-01-30: 20 mg via INTRAVENOUS
  Filled 2023-01-30: qty 50

## 2023-01-30 MED ORDER — DIPHENHYDRAMINE HCL 50 MG/ML IJ SOLN
25.0000 mg | Freq: Once | INTRAMUSCULAR | Status: AC
  Administered 2023-01-30: 25 mg via INTRAVENOUS
  Filled 2023-01-30: qty 1

## 2023-01-30 MED ORDER — SODIUM CHLORIDE 0.9 % IV SOLN: Freq: Once | INTRAVENOUS | Status: AC

## 2023-01-30 MED ORDER — PALONOSETRON HCL INJECTION 0.25 MG/5ML
0.2500 mg | Freq: Once | INTRAVENOUS | Status: AC
  Administered 2023-01-30: 0.25 mg via INTRAVENOUS
  Filled 2023-01-30: qty 5

## 2023-01-30 NOTE — Patient Instructions (Signed)
[facility]   Discharge Instructions: Thank you for choosing [facility] to provide your oncology and hematology care.   If you have a lab appointment with the [facility], please go directly to the [facility] and check in at the registration area.   Wear comfortable clothing and clothing appropriate for easy access to any Portacath or PICC line.   We strive to give you quality time with your provider. You may need to reschedule your appointment if you arrive late (15 or more minutes).  Arriving late affects you and other patients whose appointments are after yours.  Also, if you miss three or more appointments without notifying the office, you may be dismissed from the clinic at the provider's discretion.      For prescription refill requests, have your pharmacy contact our office and allow 72 hours for refills to be completed.    Today you received the following chemotherapy and/or immunotherapy agents Taxol, Carboplatin.      To help prevent nausea and vomiting after your treatment, we encourage you to take your nausea medication as directed.  BELOW ARE SYMPTOMS THAT SHOULD BE REPORTED IMMEDIATELY: *FEVER GREATER THAN 100.4 F (38 C) OR HIGHER *CHILLS OR SWEATING *NAUSEA AND VOMITING THAT IS NOT CONTROLLED WITH YOUR NAUSEA MEDICATION *UNUSUAL SHORTNESS OF BREATH *UNUSUAL BRUISING OR BLEEDING *URINARY PROBLEMS (pain or burning when urinating, or frequent urination) *BOWEL PROBLEMS (unusual diarrhea, constipation, pain near the anus) TENDERNESS IN MOUTH AND THROAT WITH OR WITHOUT PRESENCE OF ULCERS (sore throat, sores in mouth, or a toothache) UNUSUAL RASH, SWELLING OR PAIN  UNUSUAL VAGINAL DISCHARGE OR ITCHING   Items with * indicate a potential emergency and should be followed up as soon as possible or go to the Emergency Department if any problems should occur.  Please show the CHEMOTHERAPY ALERT CARD or IMMUNOTHERAPY ALERT CARD  at check-in to the Emergency Department and triage nurse.  Should you have questions after your visit or need to cancel or reschedule your appointment, please contact [facility]  Dept: [phone]  and follow the prompts.  Office hours are 8:00 a.m. to 4:30 p.m. Monday - Friday. Please note that voicemails left after 4:00 p.m. may not be returned until the following business day.  We are closed weekends and major holidays. You have access to a nurse at all times for urgent questions. Please call the main number to the clinic Dept: [phone] and follow the prompts.   For any non-urgent questions, you may also contact your provider using MyChart. We now offer e-Visits for anyone 42 and older to request care online for non-urgent symptoms. For details visit [email or web address].   Also download the MyChart app! Go to the app store, search "MyChart", open the app, select [facility], and log in with your MyChart username and password.  Paclitaxel Injection What is this medication? PACLITAXEL (PAK li TAX el) treats some types of cancer. It works by slowing down the growth of cancer cells. This medicine may be used for other purposes; ask your health care provider or pharmacist if you have questions. COMMON BRAND NAME(S): Onxol, Taxol What should I tell my care team before I take this medication? They need to know if you have any of these conditions: Heart disease Liver disease Low white blood cell levels An unusual or allergic reaction to paclitaxel, other medications, foods, dyes, or preservatives If you or your partner are pregnant or trying to get  pregnant Breast-feeding How should I use this medication? This medication is injected into a vein. It is given by your care team in a hospital or clinic setting. Talk to your care team about the use of this medication in children. While it may be given to children for selected conditions, precautions do  apply. Overdosage: If you think you have taken too much of this medicine contact a poison control center or emergency room at once. NOTE: This medicine is only for you. Do not share this medicine with others. What if I miss a dose? Keep appointments for follow-up doses. It is important not to miss your dose. Call your care team if you are unable to keep an appointment. What may interact with this medication? Do not take this medication with any of the following: Live virus vaccines Other medications may affect the way this medication works. Talk with your care team about all of the medications you take. They may suggest changes to your treatment plan to lower the risk of side effects and to make sure your medications work as intended. This list may not describe all possible interactions. Give your health care provider a list of all the medicines, herbs, non-prescription drugs, or dietary supplements you use. Also tell them if you smoke, drink alcohol, or use illegal drugs. Some items may interact with your medicine. What should I watch for while using this medication? Your condition will be monitored carefully while you are receiving this medication. You may need blood work while taking this medication. This medication may make you feel generally unwell. This is not uncommon as chemotherapy can affect healthy cells as well as cancer cells. Report any side effects. Continue your course of treatment even though you feel ill unless your care team tells you to stop. This medication can cause serious allergic reactions. To reduce the risk, your care team may give you other medications to take before receiving this one. Be sure to follow the directions from your care team. This medication may increase your risk of getting an infection. Call your care team for advice if you get a fever, chills, sore throat, or other symptoms of a cold or flu. Do not treat yourself. Try to avoid being around people who are  sick. This medication may increase your risk to bruise or bleed. Call your care team if you notice any unusual bleeding. Be careful brushing or flossing your teeth or using a toothpick because you may get an infection or bleed more easily. If you have any dental work done, tell your dentist you are receiving this medication. Talk to your care team if you may be pregnant. Serious birth defects can occur if you take this medication during pregnancy. Talk to your care team before breastfeeding. Changes to your treatment plan may be needed. What side effects may I notice from receiving this medication? Side effects that you should report to your care team as soon as possible: Allergic reactions--skin rash, itching, hives, swelling of the face, lips, tongue, or throat Heart rhythm changes--fast or irregular heartbeat, dizziness, feeling faint or lightheaded, chest pain, trouble breathing Increase in blood pressure Infection--fever, chills, cough, sore throat, wounds that don't heal, pain or trouble when passing urine, general feeling of discomfort or being unwell Low blood pressure--dizziness, feeling faint or lightheaded, blurry vision Low red blood cell level--unusual weakness or fatigue, dizziness, headache, trouble breathing Painful swelling, warmth, or redness of the skin, blisters or sores at the infusion site Pain, tingling, or numbness in  the hands or feet Slow heartbeat--dizziness, feeling faint or lightheaded, confusion, trouble breathing, unusual weakness or fatigue Unusual bruising or bleeding Side effects that usually do not require medical attention (report to your care team if they continue or are bothersome): Diarrhea Hair loss Joint pain Loss of appetite Muscle pain Nausea Vomiting This list may not describe all possible side effects. Call your doctor for medical advice about side effects. You may report side effects to FDA at 1-800-FDA-1088. Where should I keep my  medication? This medication is given in a hospital or clinic. It will not be stored at home. NOTE: This sheet is a summary. It may not cover all possible information. If you have questions about this medicine, talk to your doctor, pharmacist, or health care provider.  2023 Elsevier/Gold Standard (2022-03-28 00:00:00)  Carboplatin Injection What is this medication? CARBOPLATIN (KAR boe pla tin) treats some types of cancer. It works by slowing down the growth of cancer cells. This medicine may be used for other purposes; ask your health care provider or pharmacist if you have questions. COMMON BRAND NAME(S): Paraplatin What should I tell my care team before I take this medication? They need to know if you have any of these conditions: Blood disorders Hearing problems Kidney disease Recent or ongoing radiation therapy An unusual or allergic reaction to carboplatin, cisplatin, other medications, foods, dyes, or preservatives Pregnant or trying to get pregnant Breast-feeding How should I use this medication? This medication is injected into a vein. It is given by your care team in a hospital or clinic setting. Talk to your care team about the use of this medication in children. Special care may be needed. Overdosage: If you think you have taken too much of this medicine contact a poison control center or emergency room at once. NOTE: This medicine is only for you. Do not share this medicine with others. What if I miss a dose? Keep appointments for follow-up doses. It is important not to miss your dose. Call your care team if you are unable to keep an appointment. What may interact with this medication? Medications for seizures Some antibiotics, such as amikacin, gentamicin, neomycin, streptomycin, tobramycin Vaccines This list may not describe all possible interactions. Give your health care provider a list of all the medicines, herbs, non-prescription drugs, or dietary supplements you use.  Also tell them if you smoke, drink alcohol, or use illegal drugs. Some items may interact with your medicine. What should I watch for while using this medication? Your condition will be monitored carefully while you are receiving this medication. You may need blood work while taking this medication. This medication may make you feel generally unwell. This is not uncommon, as chemotherapy can affect healthy cells as well as cancer cells. Report any side effects. Continue your course of treatment even though you feel ill unless your care team tells you to stop. In some cases, you may be given additional medications to help with side effects. Follow all directions for their use. This medication may increase your risk of getting an infection. Call your care team for advice if you get a fever, chills, sore throat, or other symptoms of a cold or flu. Do not treat yourself. Try to avoid being around people who are sick. Avoid taking medications that contain aspirin, acetaminophen, ibuprofen, naproxen, or ketoprofen unless instructed by your care team. These medications may hide a fever. Be careful brushing or flossing your teeth or using a toothpick because you may get an infection  or bleed more easily. If you have any dental work done, tell your dentist you are receiving this medication. Talk to your care team if you wish to become pregnant or think you might be pregnant. This medication can cause serious birth defects. Talk to your care team about effective forms of contraception. Do not breast-feed while taking this medication. What side effects may I notice from receiving this medication? Side effects that you should report to your care team as soon as possible: Allergic reactions--skin rash, itching, hives, swelling of the face, lips, tongue, or throat Infection--fever, chills, cough, sore throat, wounds that don't heal, pain or trouble when passing urine, general feeling of discomfort or being  unwell Low red blood cell level--unusual weakness or fatigue, dizziness, headache, trouble breathing Pain, tingling, or numbness in the hands or feet, muscle weakness, change in vision, confusion or trouble speaking, loss of balance or coordination, trouble walking, seizures Unusual bruising or bleeding Side effects that usually do not require medical attention (report to your care team if they continue or are bothersome): Hair loss Nausea Unusual weakness or fatigue Vomiting This list may not describe all possible side effects. Call your doctor for medical advice about side effects. You may report side effects to FDA at 1-800-FDA-1088. Where should I keep my medication? This medication is given in a hospital or clinic. It will not be stored at home. NOTE: This sheet is a summary. It may not cover all possible information. If you have questions about this medicine, talk to your doctor, pharmacist, or health care provider.  2023 Elsevier/Gold Standard (2008-01-02 00:00:00)

## 2023-01-30 NOTE — Progress Notes (Signed)
[facility] OFFICE PROGRESS NOTE   Diagnosis: Esophagus cancer  INTERVAL HISTORY:   Jo Morrison returns as scheduled.  She continues daily radiation.  She completed another treatment with Taxol/carboplatin on 01/23/2023.  No symptoms of allergic reaction.  No nausea/vomiting.  No new neuropathy symptoms.  Odynophagia has progressed.  She is tolerating liquids and soft foods.  She takes hydrocodone approximately twice daily.  She has mild exertional dyspnea.  Objective:  Vital signs in last 24 hours:  Blood pressure (!) 153/86, pulse 100, temperature 98.1 F (36.7 C), resp. rate 18, height '5\' 3"'$  (1.6 m), weight 110 lb (49.9 kg), SpO2 100 %.    HEENT: Thrush or ulcers Resp: Lungs clear bilaterally Cardio: Regular rate and rhythm GI: No hepatosplenomegaly Vascular: No leg edema   Lab Results:  Lab Results  Component Value Date   WBC 2.8 (L) 01/30/2023   HGB 11.9 (L) 01/30/2023   HCT 34.8 (L) 01/30/2023   MCV 90.9 01/30/2023   PLT 183 01/30/2023   NEUTROABS 1.8 01/30/2023    CMP  Lab Results  Component Value Date   NA 141 01/30/2023   K 3.6 01/30/2023   CL 105 01/30/2023   CO2 29 01/30/2023   GLUCOSE 98 01/30/2023   BUN 11 01/30/2023   CREATININE 0.69 01/30/2023   CALCIUM 9.1 01/30/2023   PROT 6.3 (L) 01/30/2023   ALBUMIN 3.8 01/30/2023   AST 13 (L) 01/30/2023   ALT 9 01/30/2023   ALKPHOS 44 01/30/2023   BILITOT 0.8 01/30/2023   GFRNONAA >60 01/30/2023   GFRAA >60 02/21/2020    No results found for: "CEA1", "CEA", "[id number]", "CA125"  No results found for: "INR", "LABPROT"  Imaging:  No results found.  Medications: I have reviewed the patient's current medications.   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  For hypermetabolic metastatic lymph nodes, 3 in the mediastinum and 1 in the upper abdomen gastrohepatic ligament.  EUS 02/10/2020-ulcerated mass in the mid esophagus,uT3, 3 suspicious lymph nodes near the primary tumor, malignant appearing lymph node at the gastrohepatic ligament (level 18)-metastatic squamous cell carcinoma Radiation 01/31/2020 Cycle 1 weekly Taxol/carboplatin 02/01/2020 Cycle 2 weekly Taxol/carboplatin 02/08/2020 Cycle 3 weekly Taxol/carboplatin 02/15/2020 Cycle 4 weekly Taxol/carboplatin 02/22/2020 Cycle 5 weekly Taxol/carboplatin 02/29/2020 Radiation completed 03/08/2020 CTs 04/03/2020-esophageal mass appears grossly smaller, regression of previously noted lymphadenopathy in the mediastinum and upper abdomen, nonocclusive pulmonary embolus right-sided pulmonary arteries Upper endoscopy 05/02/2020-treatment effect visible starting approximately 26 cm from the incisors.  Scope passed easily into the stomach and proximal duodenum.  Stomach and proximal duodenum grossly normal.  Scope withdrawn to the mid esophagus with four-quadrant biopsies obtained.  2 biopsy showed acute ulcerative esophagitis; 2 biopsies showed squamous mucosa with minimal histologic abnormality. CTs 07/24/2020-no evidence of metastatic disease, left gastric lymph node has decreased in size, mild wall thickening of the mid to distal esophagus Upper endoscopy 07/25/2020-multiple biopsies with changes of ulcerative esophagitis, negative fungal, HSV, and CMV stains, no evidence of malignancy CTs at [facility] [address] 11/27/2020-interval decrease in prior mediastinal groundglass opacities; decrease in now mild diffuse esophageal wall thickening, most prominent in the mid and distal  esophagus; few scattered 3 mm and smaller noncalcified pulmonary nodules not significantly changed.  No definite new or enlarging pulmonary nodule.  No evidence of metastatic disease in the abdomen. EGD 11/28/2020-mild and discrete stricture in the distal esophagus; no mass lesions seen.  Biopsies taken, negative for malignancy x4; serial dilatation performed. CTs at [facility] 08/06/2021-no evidence of recurrent disease EGD 08/07/2021-no mass, mild stricture, multiple biopsies negative for carcinoma EGD at [facility] 12/25/2021-mild stricture, no mass, biopsies negative for carcinoma, 1 biopsy with possible low-grade dysplasia CTs 12/24/2021-negative for recurrent disease CTs at [facility] 07/01/2022-no evidence of recurrent disease Endoscopy 07/02/2022-negative for malignancy Progressive dysphagia/odynophagia status post EGD 12/17/2022-near complete obstruction UES, biopsy-moderately differentiated squamous cell carcinoma; PD-L1 CPS 45  PET scan at [facility] 12/18/2022-FDG avidity in the cervical esophagus.  No FDG avid metastatic disease. Radiation 01/02/2023 Cycle 1 weekly Taxol/carboplatin 01/02/2023 Cycle 2 weekly Taxol/carboplatin 01/09/2023 Cycle 3 weekly Taxol/carboplatin 01/16/2023 Cycle 4 weekly Taxol/carboplatin 01/23/2023 Cycle 5 weekly Taxol/carboplatin 01/30/2023 Right breast cancer July 1996, [name], [name], right lumpectomy and axillary dissection followed by adjuvant Adriamycin/Taxol chemotherapy-4 cycles, 5 years of tamoxifen, and breast radiation   3.   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  Depression   5.  Hypertension   6.  Right eye uveitis, macular edema, and corneal edema   7.  Asthma   8.  Pulmonary embolus on CT 04/03/2020-lovenox initiated, anticoagulation changed to apixaban beginning 08/03/2020, anticoagulation completed approximately the beginning of December 2021   9.  Odynophagia  secondary to radiation -resolved    Disposition: Jo Morrison has a history of locally recurrent esophagus cancer.  She is completing a course of concurrent chemotherapy and radiation.  She will complete a final week of Taxol/carboplatin today.  She is scheduled to complete radiation 02/10/2023.  Jo Morrison will return for an office and lab visit in 2 weeks.  She will be referred for restaging chest CT 3-4 weeks following completion of radiation.  We will consider "adjuvant "immunotherapy.  Jo Coder, MD  01/30/2023  12:53 PM

## 2023-01-30 NOTE — Progress Notes (Signed)
Patient seen by Dr. Benay Spice today  Vitals are within treatment parameters.No intervention for BP 153/86 or pulse 100  Labs reviewed by Dr. Benay Spice and are within treatment parameters.  Per physician team, patient is ready for treatment and there are NO modifications to the treatment plan.

## 2023-01-31 ENCOUNTER — Other Ambulatory Visit: Payer: Self-pay

## 2023-01-31 ENCOUNTER — Ambulatory Visit
Admission: RE | Admit: 2023-01-31 | Discharge: 2023-01-31 | Disposition: A | Discharge status: Home / Self Care | Payer: PPO | Source: Ambulatory Visit | Attending: Radiation Oncology | Admitting: Radiation Oncology

## 2023-01-31 DIAGNOSIS — C158 Malignant neoplasm of overlapping sites of esophagus: Secondary | ICD-10-CM | POA: Diagnosis not present

## 2023-01-31 DIAGNOSIS — Z510 Encounter for antineoplastic radiation therapy: Secondary | ICD-10-CM | POA: Diagnosis not present

## 2023-01-31 LAB — RAD ONC ARIA SESSION SUMMARY
Course Elapsed Days: 29
Plan Fractions Treated to Date: 22
Plan Prescribed Dose Per Fraction: 1.8 Gy
Plan Total Fractions Prescribed: 25
Plan Total Prescribed Dose: 45 Gy
Reference Point Dosage Given to Date: 39.6 Gy
Reference Point Session Dosage Given: 1.8 Gy
Session Number: 22

## 2023-02-02 ENCOUNTER — Encounter: Payer: Self-pay | Admitting: Nurse Practitioner

## 2023-02-03 ENCOUNTER — Ambulatory Visit
Admission: RE | Admit: 2023-02-03 | Discharge: 2023-02-03 | Disposition: A | Discharge status: Home / Self Care | Payer: PPO | Source: Ambulatory Visit | Attending: Radiation Oncology | Admitting: Radiation Oncology

## 2023-02-03 ENCOUNTER — Other Ambulatory Visit: Payer: Self-pay | Admitting: Nurse Practitioner

## 2023-02-03 ENCOUNTER — Other Ambulatory Visit: Payer: Self-pay

## 2023-02-03 DIAGNOSIS — C158 Malignant neoplasm of overlapping sites of esophagus: Secondary | ICD-10-CM | POA: Diagnosis not present

## 2023-02-03 DIAGNOSIS — Z510 Encounter for antineoplastic radiation therapy: Secondary | ICD-10-CM | POA: Diagnosis not present

## 2023-02-03 DIAGNOSIS — Z8501 Personal history of malignant neoplasm of esophagus: Secondary | ICD-10-CM

## 2023-02-03 LAB — RAD ONC ARIA SESSION SUMMARY
Course Elapsed Days: 32
Plan Fractions Treated to Date: 23
Plan Prescribed Dose Per Fraction: 1.8 Gy
Plan Total Fractions Prescribed: 25
Plan Total Prescribed Dose: 45 Gy
Reference Point Dosage Given to Date: 41.4 Gy
Reference Point Session Dosage Given: 1.8 Gy
Session Number: 23

## 2023-02-03 MED ORDER — HYDROCODONE-ACETAMINOPHEN 7.5-325 MG/15ML PO SOLN: 10.0000 mL | Freq: Four times a day (QID) | ORAL | 0 refills | Status: DC | PRN

## 2023-02-04 ENCOUNTER — Other Ambulatory Visit: Payer: Self-pay | Admitting: Physician Assistant

## 2023-02-04 ENCOUNTER — Other Ambulatory Visit: Payer: Self-pay

## 2023-02-04 ENCOUNTER — Ambulatory Visit
Admission: RE | Admit: 2023-02-04 | Discharge: 2023-02-04 | Disposition: A | Discharge status: Home / Self Care | Payer: PPO | Source: Ambulatory Visit | Attending: Radiation Oncology | Admitting: Radiation Oncology

## 2023-02-04 DIAGNOSIS — C158 Malignant neoplasm of overlapping sites of esophagus: Secondary | ICD-10-CM | POA: Diagnosis not present

## 2023-02-04 DIAGNOSIS — Z510 Encounter for antineoplastic radiation therapy: Secondary | ICD-10-CM | POA: Diagnosis not present

## 2023-02-04 LAB — RAD ONC ARIA SESSION SUMMARY
Course Elapsed Days: 33
Plan Fractions Treated to Date: 24
Plan Prescribed Dose Per Fraction: 1.8 Gy
Plan Total Fractions Prescribed: 25
Plan Total Prescribed Dose: 45 Gy
Reference Point Dosage Given to Date: 43.2 Gy
Reference Point Session Dosage Given: 1.8 Gy
Session Number: 24

## 2023-02-05 ENCOUNTER — Other Ambulatory Visit: Payer: Self-pay | Admitting: Nurse Practitioner

## 2023-02-05 ENCOUNTER — Other Ambulatory Visit: Payer: Self-pay

## 2023-02-05 ENCOUNTER — Ambulatory Visit: Payer: PPO

## 2023-02-05 ENCOUNTER — Other Ambulatory Visit: Payer: Self-pay | Admitting: Oncology

## 2023-02-05 ENCOUNTER — Ambulatory Visit
Admission: RE | Admit: 2023-02-05 | Discharge: 2023-02-05 | Disposition: A | Discharge status: Home / Self Care | Payer: PPO | Source: Ambulatory Visit | Attending: Radiation Oncology | Admitting: Radiation Oncology

## 2023-02-05 DIAGNOSIS — C158 Malignant neoplasm of overlapping sites of esophagus: Secondary | ICD-10-CM

## 2023-02-05 DIAGNOSIS — Z510 Encounter for antineoplastic radiation therapy: Secondary | ICD-10-CM | POA: Diagnosis not present

## 2023-02-05 LAB — RAD ONC ARIA SESSION SUMMARY
Course Elapsed Days: 34
Plan Fractions Treated to Date: 25
Plan Prescribed Dose Per Fraction: 1.8 Gy
Plan Total Fractions Prescribed: 25
Plan Total Prescribed Dose: 45 Gy
Reference Point Dosage Given to Date: 45 Gy
Reference Point Session Dosage Given: 1.8 Gy
Session Number: 25

## 2023-02-06 ENCOUNTER — Ambulatory Visit
Admission: RE | Admit: 2023-02-06 | Discharge: 2023-02-06 | Disposition: A | Discharge status: Home / Self Care | Payer: PPO | Source: Ambulatory Visit | Attending: Radiation Oncology | Admitting: Radiation Oncology

## 2023-02-06 ENCOUNTER — Other Ambulatory Visit: Payer: Self-pay

## 2023-02-06 ENCOUNTER — Ambulatory Visit: Payer: PPO

## 2023-02-06 DIAGNOSIS — Z510 Encounter for antineoplastic radiation therapy: Secondary | ICD-10-CM | POA: Diagnosis not present

## 2023-02-06 DIAGNOSIS — C158 Malignant neoplasm of overlapping sites of esophagus: Secondary | ICD-10-CM | POA: Diagnosis not present

## 2023-02-06 LAB — RAD ONC ARIA SESSION SUMMARY
Course Elapsed Days: 35
Plan Fractions Treated to Date: 1
Plan Prescribed Dose Per Fraction: 1.8 Gy
Plan Total Fractions Prescribed: 3
Plan Total Prescribed Dose: 5.4 Gy
Reference Point Dosage Given to Date: 1.8 Gy
Reference Point Session Dosage Given: 1.8 Gy
Session Number: 26

## 2023-02-07 ENCOUNTER — Ambulatory Visit: Admission: RE | Admit: 2023-02-07 | Payer: PPO | Source: Ambulatory Visit

## 2023-02-07 ENCOUNTER — Ambulatory Visit
Admission: RE | Admit: 2023-02-07 | Discharge: 2023-02-07 | Disposition: A | Discharge status: Home / Self Care | Payer: PPO | Source: Ambulatory Visit | Attending: Radiation Oncology | Admitting: Radiation Oncology

## 2023-02-07 ENCOUNTER — Ambulatory Visit: Payer: PPO

## 2023-02-07 ENCOUNTER — Other Ambulatory Visit: Payer: Self-pay

## 2023-02-07 DIAGNOSIS — C158 Malignant neoplasm of overlapping sites of esophagus: Secondary | ICD-10-CM

## 2023-02-07 DIAGNOSIS — Z510 Encounter for antineoplastic radiation therapy: Secondary | ICD-10-CM | POA: Diagnosis not present

## 2023-02-07 LAB — RAD ONC ARIA SESSION SUMMARY
Course Elapsed Days: 36
Plan Fractions Treated to Date: 2
Plan Prescribed Dose Per Fraction: 1.8 Gy
Plan Total Fractions Prescribed: 3
Plan Total Prescribed Dose: 5.4 Gy
Reference Point Dosage Given to Date: 3.6 Gy
Reference Point Session Dosage Given: 1.8 Gy
Session Number: 27

## 2023-02-07 MED ORDER — SONAFINE EX EMUL
1.0000 | Freq: Once | CUTANEOUS | Status: AC
  Administered 2023-02-07: 1 via TOPICAL

## 2023-02-10 ENCOUNTER — Ambulatory Visit: Payer: PPO

## 2023-02-10 ENCOUNTER — Ambulatory Visit
Admission: RE | Admit: 2023-02-10 | Discharge: 2023-02-10 | Disposition: A | Discharge status: Home / Self Care | Payer: PPO | Source: Ambulatory Visit | Attending: Radiation Oncology | Admitting: Radiation Oncology

## 2023-02-10 ENCOUNTER — Encounter: Payer: Self-pay | Admitting: Radiation Oncology

## 2023-02-10 ENCOUNTER — Other Ambulatory Visit: Payer: Self-pay

## 2023-02-10 DIAGNOSIS — Z510 Encounter for antineoplastic radiation therapy: Secondary | ICD-10-CM | POA: Diagnosis not present

## 2023-02-10 DIAGNOSIS — C158 Malignant neoplasm of overlapping sites of esophagus: Secondary | ICD-10-CM | POA: Diagnosis not present

## 2023-02-10 LAB — RAD ONC ARIA SESSION SUMMARY
Course Elapsed Days: 39
Plan Fractions Treated to Date: 3
Plan Prescribed Dose Per Fraction: 1.8 Gy
Plan Total Fractions Prescribed: 3
Plan Total Prescribed Dose: 5.4 Gy
Reference Point Dosage Given to Date: 5.4 Gy
Reference Point Session Dosage Given: 1.8 Gy
Session Number: 28

## 2023-02-11 ENCOUNTER — Ambulatory Visit: Payer: PPO

## 2023-02-12 ENCOUNTER — Inpatient Hospital Stay (HOSPITAL_BASED_OUTPATIENT_CLINIC_OR_DEPARTMENT_OTHER): Payer: PPO | Admitting: Oncology

## 2023-02-12 ENCOUNTER — Telehealth: Payer: Self-pay | Admitting: *Deleted

## 2023-02-12 ENCOUNTER — Ambulatory Visit: Payer: PPO

## 2023-02-12 ENCOUNTER — Inpatient Hospital Stay: Payer: PPO

## 2023-02-12 VITALS — BP 99/65 | HR 76 | Temp 97.9°F | Resp 18 | Ht 63.0 in | Wt 103.8 lb

## 2023-02-12 DIAGNOSIS — C158 Malignant neoplasm of overlapping sites of esophagus: Secondary | ICD-10-CM | POA: Diagnosis not present

## 2023-02-12 DIAGNOSIS — Z8501 Personal history of malignant neoplasm of esophagus: Secondary | ICD-10-CM

## 2023-02-12 LAB — CMP (CANCER CENTER ONLY)
ALT: 7 U/L (ref 0–44)
AST: 12 U/L — ABNORMAL LOW (ref 15–41)
Albumin: 3.7 g/dL (ref 3.5–5.0)
Alkaline Phosphatase: 43 U/L (ref 38–126)
Anion gap: 7 (ref 5–15)
BUN: 16 mg/dL (ref 8–23)
CO2: 34 mmol/L — ABNORMAL HIGH (ref 22–32)
Calcium: 9.6 mg/dL (ref 8.9–10.3)
Chloride: 99 mmol/L (ref 98–111)
Creatinine: 0.81 mg/dL (ref 0.44–1.00)
GFR, Estimated: >60 mL/min (ref >60)
Glucose, Bld: 149 mg/dL — ABNORMAL HIGH (ref 70–99)
Potassium: 3.3 mmol/L — ABNORMAL LOW (ref 3.5–5.1)
Sodium: 140 mmol/L (ref 135–145)
Total Bilirubin: 0.8 mg/dL (ref 0.3–1.2)
Total Protein: 6.5 g/dL (ref 6.5–8.1)

## 2023-02-12 LAB — CBC WITH DIFFERENTIAL (CANCER CENTER ONLY)
Abs Immature Granulocytes: 0.01 10*3/uL (ref 0.00–0.07)
Basophils Absolute: 0 10*3/uL (ref 0.0–0.1)
Basophils Relative: 1 %
Eosinophils Absolute: 0.1 10*3/uL (ref 0.0–0.5)
Eosinophils Relative: 4 %
HCT: 35.2 % — ABNORMAL LOW (ref 36.0–46.0)
Hemoglobin: 11.8 g/dL — ABNORMAL LOW (ref 12.0–15.0)
Immature Granulocytes: 0 %
Lymphocytes Relative: 16 %
Lymphs Abs: 0.5 10*3/uL — ABNORMAL LOW (ref 0.7–4.0)
MCH: 30.8 pg (ref 26.0–34.0)
MCHC: 33.5 g/dL (ref 30.0–36.0)
MCV: 91.9 fL (ref 80.0–100.0)
Monocytes Absolute: 0.3 10*3/uL (ref 0.1–1.0)
Monocytes Relative: 11 %
Neutro Abs: 2 10*3/uL (ref 1.7–7.7)
Neutrophils Relative %: 68 %
Platelet Count: 180 10*3/uL (ref 150–400)
RBC: 3.83 MIL/uL — ABNORMAL LOW (ref 3.87–5.11)
RDW: 15.3 % (ref 11.5–15.5)
WBC Count: 2.9 10*3/uL — ABNORMAL LOW (ref 4.0–10.5)
nRBC: 0 % (ref 0.0–0.2)

## 2023-02-12 NOTE — Telephone Encounter (Signed)
Informed Mrs. Luberto that Dr. Benay Spice suggests she hold her HCTZ until her odynophagia resolves. This could be contributing to her weakness and dizziness. Dr. Williemae Natter reached out and said an EGD will be scheduled in 1 month. Dr. Benay Spice will order CT chest after next office visit. She verbalized understanding and agrees.

## 2023-02-12 NOTE — Progress Notes (Signed)
[place] OFFICE PROGRESS NOTE   Diagnosis: Esophagus cancer  INTERVAL HISTORY:   Jo Morrison returns as scheduled.  She completed radiation on 02/10/2023.  She continues to have a dyne aphasia.  She is tolerating liquids and some foods.  No new complaint.  Objective:  Vital signs in last 24 hours:  Blood pressure 99/65, pulse 76, temperature 97.9 F (36.6 C), temperature source Oral, resp. rate 18, height 5\' 3"  (1.6 m), weight 103 lb 12.8 oz (47.1 kg), SpO2 98 %.    HEENT: No thrush or ulcers Lymphatics: No cervical, supraclavicular, or axillary nodes Resp: Lungs clear bilaterally Cardio: Regular rate and rhythm GI: No hepatosplenomegaly, no mass, nontender Vascular: No leg edema   Lab Results:  Lab Results  Component Value Date   WBC 2.9 (L) 02/12/2023   HGB 11.8 (L) 02/12/2023   HCT 35.2 (L) 02/12/2023   MCV 91.9 02/12/2023   PLT 180 02/12/2023   NEUTROABS 2.0 02/12/2023    CMP  Lab Results  Component Value Date   NA 140 02/12/2023   K 3.3 (L) 02/12/2023   CL 99 02/12/2023   CO2 34 (H) 02/12/2023   GLUCOSE 149 (H) 02/12/2023   BUN 16 02/12/2023   CREATININE 0.81 02/12/2023   CALCIUM 9.6 02/12/2023   PROT 6.5 02/12/2023   ALBUMIN 3.7 02/12/2023   AST 12 (L) 02/12/2023   ALT 7 02/12/2023   ALKPHOS 43 02/12/2023   BILITOT 0.8 02/12/2023   GFRNONAA >60 02/12/2023   GFRAA >60 02/21/2020     Medications: I have reviewed the patient's current medications.   Assessment/Plan: Squamous cell carcinoma of the upper/middle third of the esophagus Upper endoscopy 01/14/2020-ulcerated mass in the proximal/mid esophagus at 26-31 cm from the incisors, biopsy confirmed invasive moderately differentiated squamous cell carcinoma, PD-L1 combined positive score 0% CT abdomen/pelvis 12/09/2019-uterine fibroids CT chest 12/30/2019-normal esophagus, new sclerosis of the medial head of the right clavicle likely degenerative PET scan 01/24/2020-long segment of  hypermetabolic thickening of the distal esophagus.  For hypermetabolic metastatic lymph nodes, 3 in the mediastinum and 1 in the upper abdomen gastrohepatic ligament.  EUS 02/10/2020-ulcerated mass in the mid esophagus,uT3, 3 suspicious lymph nodes near the primary tumor, malignant appearing lymph node at the gastrohepatic ligament (level 18)-metastatic squamous cell carcinoma Radiation 01/31/2020 Cycle 1 weekly Taxol/carboplatin 02/01/2020 Cycle 2 weekly Taxol/carboplatin 02/08/2020 Cycle 3 weekly Taxol/carboplatin 02/15/2020 Cycle 4 weekly Taxol/carboplatin 02/22/2020 Cycle 5 weekly Taxol/carboplatin 02/29/2020 Radiation completed 03/08/2020 CTs 04/03/2020-esophageal mass appears grossly smaller, regression of previously noted lymphadenopathy in the mediastinum and upper abdomen, nonocclusive pulmonary embolus right-sided pulmonary arteries Upper endoscopy 05/02/2020-treatment effect visible starting approximately 26 cm from the incisors.  Scope passed easily into the stomach and proximal duodenum.  Stomach and proximal duodenum grossly normal.  Scope withdrawn to the mid esophagus with four-quadrant biopsies obtained.  2 biopsy showed acute ulcerative esophagitis; 2 biopsies showed squamous mucosa with minimal histologic abnormality. CTs 07/24/2020-no evidence of metastatic disease, left gastric lymph node has decreased in size, mild wall thickening of the mid to distal esophagus Upper endoscopy 07/25/2020-multiple biopsies with changes of ulcerative esophagitis, negative fungal, HSV, and CMV stains, no evidence of malignancy CTs at [facility] 11/27/2020-interval decrease in prior mediastinal groundglass opacities; decrease in now mild diffuse esophageal wall thickening, most prominent in the mid and distal esophagus; few scattered 3 mm and smaller noncalcified pulmonary nodules not significantly changed.  No definite new or enlarging pulmonary nodule.  No evidence of metastatic disease in the abdomen. EGD  11/28/2020-mild  and discrete stricture in the distal esophagus; no mass lesions seen.  Biopsies taken, negative for malignancy x4; serial dilatation performed. CTs at [facility] 08/06/2021-no evidence of recurrent disease EGD 08/07/2021-no mass, mild stricture, multiple biopsies negative for carcinoma EGD at [facility] 12/25/2021-mild stricture, no mass, biopsies negative for carcinoma, 1 biopsy with possible low-grade dysplasia CTs 12/24/2021-negative for recurrent disease CTs at [facility] 07/01/2022-no evidence of recurrent disease Endoscopy 07/02/2022-negative for malignancy Progressive dysphagia/odynophagia status post EGD 12/17/2022-near complete obstruction UES, biopsy-moderately differentiated squamous cell carcinoma; PD-L1 CPS 45  PET scan at [facility] 12/18/2022-FDG avidity in the cervical esophagus.  No FDG avid metastatic disease. Radiation 01/02/2023-02/10/2023 Cycle 1 weekly Taxol/carboplatin 01/02/2023 Cycle 2 weekly Taxol/carboplatin 01/09/2023 Cycle 3 weekly Taxol/carboplatin 01/16/2023 Cycle 4 weekly Taxol/carboplatin 01/23/2023 Cycle 5 weekly Taxol/carboplatin 01/30/2023 Right breast cancer July 1996, [name], [name], right lumpectomy and axillary dissection followed by adjuvant Adriamycin/Taxol chemotherapy-4 cycles, 5 years of tamoxifen, and breast radiation   3.   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  Depression   5.  Hypertension   6.  Right eye uveitis, macular edema, and corneal edema   7.  Asthma   8.  Pulmonary embolus on CT 04/03/2020-lovenox initiated, anticoagulation changed to apixaban beginning 08/03/2020, anticoagulation completed approximately the beginning of December 2021   9.  Odynophagia secondary to radiation -resolved      Disposition: Jo Morrison completed the course of Taxol/carboplatin and radiation.  She tolerated the treatment well.  She has persistent odynophagia.  Hopefully this will  improve over the next few weeks.  She will work on maintaining her hydration and weight.  She will contact us if she has difficulty with oral intake.  I contacted Dr. Williemae Natter.  She will schedule Jo Morrison for a restaging endoscopy in approximately 1 month.  I will see her on 03/18/2023.  We will schedule a restaging CT chest.  The plan is to administer a course of immunotherapy if there is endoscopic evidence of residual disease.  We will ask her to hold HCTZ until the odynophagia resolves.  She will continue a potassium supplement.  Betsy Coder, MD  02/12/2023  11:58 AM

## 2023-02-13 ENCOUNTER — Encounter: Payer: Self-pay | Admitting: Oncology

## 2023-02-13 NOTE — Progress Notes (Signed)
                                                                                                                                                             Patient Name: SEMRA WENSEL MRN: [id number] DOB: 10-30-1946 Referring Physician: Vertell Novak (Profile Not Attached) Date of Service: 02/10/2023 [facility], [place]                                                        End Of Treatment Note  Diagnoses: C15.8-Malignant neoplasm of overlapping sites of esophagus  Cancer Staging:   Locally Recurrent Squamous Cell Carcinoma of the involving the cervical esophagus and a history of overlapping squamous cell carcinoma involving the proximal and middle third esophagus   Intent: Curative  Radiation Treatment Dates:    01/02/2023 through 02/10/2023 Site Technique Total Dose (Gy) Dose per Fx (Gy) Completed Fx Beam Energies  Esophagus: Esoph IMRT 45/45 1.8 25/25 6X  Esophagus: Esoph_Bst IMRT 5.4/5.4 1.8 3/3 6X   Narrative: The patient tolerated radiation therapy relatively well. She developed fatigue and anticipated skin changes in the treatment field and esophagitis due to her treatment.  Plan: The patient will receive a call in about one month from the radiation oncology department. She will continue follow up with Dr. Benay Spice as well.   ________________________________________________    Carola Rhine, [facility]

## 2023-02-14 ENCOUNTER — Encounter: Payer: Self-pay | Admitting: Physician Assistant

## 2023-02-14 ENCOUNTER — Ambulatory Visit (INDEPENDENT_AMBULATORY_CARE_PROVIDER_SITE_OTHER): Payer: PPO | Admitting: Physician Assistant

## 2023-02-14 ENCOUNTER — Encounter: Payer: Self-pay | Admitting: Oncology

## 2023-02-14 DIAGNOSIS — C158 Malignant neoplasm of overlapping sites of esophagus: Secondary | ICD-10-CM | POA: Diagnosis not present

## 2023-02-14 DIAGNOSIS — G4700 Insomnia, unspecified: Secondary | ICD-10-CM | POA: Diagnosis not present

## 2023-02-14 DIAGNOSIS — F3342 Major depressive disorder, recurrent, in full remission: Secondary | ICD-10-CM | POA: Diagnosis not present

## 2023-02-14 DIAGNOSIS — F411 Generalized anxiety disorder: Secondary | ICD-10-CM

## 2023-02-14 MED ORDER — TRAZODONE HCL 100 MG PO TABS: 100.0000 mg | ORAL_TABLET | Freq: Every evening | ORAL | 3 refills | Status: DC | PRN

## 2023-02-14 MED ORDER — VENLAFAXINE HCL ER 150 MG PO CP24: 150.0000 mg | ORAL_CAPSULE | Freq: Every day | ORAL | 3 refills | Status: DC

## 2023-02-14 NOTE — Progress Notes (Unsigned)
[facility] Med Check  Patient ID: Jo Morrison,  MRN: [id number]  PCP: Hoyt Koch, MD  Date of Evaluation: 02/14/2023 Time spent:20 minutes  Chief Complaint:  Chief Complaint   [name]     HISTORY/CURRENT STATUS: HPI for annual medication check.  See ROS, states she is handling this diagnosis and treatment pretty good.  Her husband is taking it harder than she is.  She has times where she feels down but overall her mood is mostly good.  Energy and motivation can be low depending on chemo and radiation schedule.  ADLs and personal hygiene are normal.  She is able to swallow much better now, just soft things.  Cannot swallow pills.  Has trouble sleeping if she does not take the trazodone.  Does not have a lot of anxiety.  Not taking the Klonopin hardly ever.  No suicidal or homicidal thoughts.  Is crushing the trazodone and it is helpful for the most part.  There are times when she is uncomfortable lying down which prevents good sleep.  States the trazodone of course is not going to help then.  She does take a nap during the day sometimes.  Patient denies increased energy with decreased need for sleep, increased talkativeness, racing thoughts, impulsivity or risky behaviors, increased spending, increased libido, grandiosity, increased irritability or anger, paranoia, or hallucinations.  Individual Medical History/ Review of Systems: Changes? :Yes     Esophageal cancer recurred, EGD on 12/17/2022 showed almost complete obstruction, biopsy showed moderately differentiated squamous cell carcinoma.  See Dr. Betsy Coder and Ned Card, NP's notes. Finished XRT on 02/10/2023. Will see [facility] surgeon  Past medications for mental health diagnoses include: Effexor XR, trazodone, Klonopin, Sonata  Allergies: Latex  Current Medications:  Current Outpatient Medications:    brimonidine (ALPHAGAN) 0.2 % ophthalmic solution, Place 1 drop into the right eye 3 (three) times daily.,  Disp: , Rfl:    dorzolamide-timolol (COSOPT) 22.3-6.8 MG/ML ophthalmic solution, Place 1 drop into the right eye 2 (two) times daily. , Disp: , Rfl:    fluticasone (FLONASE) 50 MCG/ACT nasal spray, Place 2 sprays into the nose daily as needed for allergies. , Disp: , Rfl:    fluticasone furoate-vilanterol (BREO ELLIPTA) 200-25 MCG/ACT AEPB, Inhale 1 puff into the lungs daily., Disp: 3 each, Rfl: 3   HYDROcodone-acetaminophen (HYCET) 7.5-325 mg/15 ml solution, Take 10 mLs by mouth 4 (four) times daily as needed for moderate pain., Disp: 473 mL, Rfl: 0   loteprednol (LOTEMAX) 0.5 % ophthalmic suspension, Place 1 drop into the right eye 4 (four) times daily., Disp: , Rfl:    potassium chloride 20 MEQ/15ML (10%) SOLN, TAKE 15 ML BY MOUTH DAILY, Disp: 1373 mL, Rfl: 0   REFRESH OPTIVE ADVANCED 0.5-1-0.5 % SOLN, Place 1 drop into both eyes daily as needed (itching Eyes). , Disp: , Rfl:    clonazePAM (KLONOPIN) 0.5 MG tablet, Take 0.5-1 tablets (0.25-0.5 mg total) by mouth 2 (two) times daily as needed. (Patient not taking: Reported on 01/30/2023), Disp: 30 tablet, Rfl: 1   hydrochlorothiazide (HYDRODIURIL) 25 MG tablet, TAKE 1 TABLET(25 MG) BY MOUTH DAILY (Patient not taking: Reported on 02/14/2023), Disp: 90 tablet, Rfl: 3   ondansetron (ZOFRAN-ODT) 8 MG disintegrating tablet, Take 1 tablet (8 mg total) by mouth every 8 (eight) hours as needed for nausea or vomiting. (Patient not taking: Reported on 01/30/2023), Disp: 20 tablet, Rfl: 1   prochlorperazine (COMPAZINE) 10 MG tablet, Take 1 tablet (10 mg total) by mouth every 6 (  six) hours as needed for nausea or vomiting. (Patient not taking: Reported on 01/30/2023), Disp: 30 tablet, Rfl: 1   traZODone (DESYREL) 100 MG tablet, Take 1-2 tablets (100-200 mg total) by mouth at bedtime as needed for sleep., Disp: 180 tablet, Rfl: 3   venlafaxine XR (EFFEXOR-XR) 150 MG 24 hr capsule, Take 1 capsule (150 mg total) by mouth daily., Disp: 90 capsule, Rfl: 3 Medication Side  Effects: none  Family Medical/ Social History: Changes? No  MENTAL HEALTH EXAM:  There were no vitals taken for this visit.There is no height or weight on file to calculate BMI.  General Appearance: Casual, Well Groomed, and partial alopecia, wearing a scarf over her head  Eye Contact:  Good  Speech:  Clear and Coherent and Normal Rate  Volume:  Normal  Mood:  Euthymic  Affect:  Appropriate  Thought Process:  Goal Directed and Descriptions of Associations: Circumstantial  Orientation:  Full (Time, Place, and Person)  Thought Content: Logical   Suicidal Thoughts:  No  Homicidal Thoughts:  No  Memory:  WNL  Judgement:  Good  Insight:  Good  Psychomotor Activity:  Normal  Concentration:  Concentration: Good  Recall:  Good  Fund of Knowledge: Good  Language: Good  Assets:  Desire for Improvement  ADL's:  Intact  Cognition: WNL  Prognosis:  Good   DIAGNOSES:    ICD-10-CM   1. Recurrent major depressive disorder, in full remission ([place])  F33.42     2. Insomnia, unspecified type  G47.00     3. Generalized anxiety disorder  F41.1     4. Primary squamous cell carcinoma of overlapping sites of esophagus ([place])  C15.8       Receiving Psychotherapy: No   RECOMMENDATIONS:  PDMP was reviewed.  Klonopin filled 02/15/2022.  Also on hydrocodone. I provided 20 minutes of face to face time during this encounter, including time spent before and after the visit in records review, medical decision making, counseling pertinent to today's visit, and charting.   I am sorry to hear about the recurrence of esophageal cancer, but overall her mental health is good.  She is responding well to radiation and chemo and will continue to follow up with oncology as directed.  She is able to open up the Effexor XR capsule and sprinkle it on ice cream, that is not typical but it is fine to continue that.  Some of the extended release benefit may not be there however she is doing well and there is no need  to change at this point.  Continue Klonopin 0.5 mg, 1/2-1 p.o. twice daily as needed.  (She rarely takes.) Continue trazodone 100 mg, 1-2 nightly as needed sleep. Continue Effexor XR 150 mg, 1 p.o. daily. Return in 1 year, she prefers to continue annual follow-ups.  She will call if needed prior to that time.  Donnal Moat, PA-C

## 2023-02-24 ENCOUNTER — Encounter: Payer: Self-pay | Admitting: Oncology

## 2023-02-25 ENCOUNTER — Telehealth: Payer: Self-pay

## 2023-02-25 ENCOUNTER — Other Ambulatory Visit: Payer: Self-pay

## 2023-02-25 NOTE — Telephone Encounter (Signed)
The patient contacted our office regarding swelling in her ankles, but upon speaking with her over the phone, she reported that the swelling had subsided after going for a walk. I recommended that she elevate her legs above her heart and instructed her to contact us if she has any further concerns.

## 2023-03-13 ENCOUNTER — Telehealth: Payer: Self-pay

## 2023-03-13 NOTE — Telephone Encounter (Signed)
Called patient to schedule Medicare Annual Wellness Visit (AWV). Unable to reach patient.  Last date of AWV: 04/03/22  Please schedule an appointment at any time on Annual Wellness Schedule.

## 2023-03-16 DIAGNOSIS — H02843 Edema of right eye, unspecified eyelid: Secondary | ICD-10-CM | POA: Diagnosis not present

## 2023-03-16 DIAGNOSIS — H01003 Unspecified blepharitis right eye, unspecified eyelid: Secondary | ICD-10-CM | POA: Diagnosis not present

## 2023-03-17 ENCOUNTER — Encounter: Payer: Self-pay | Admitting: Oncology

## 2023-03-18 ENCOUNTER — Ambulatory Visit: Payer: PPO | Admitting: Oncology

## 2023-03-19 ENCOUNTER — Other Ambulatory Visit: Payer: Self-pay | Admitting: Radiation Oncology

## 2023-03-27 ENCOUNTER — Inpatient Hospital Stay: Payer: PPO | Admitting: Oncology

## 2023-03-31 ENCOUNTER — Ambulatory Visit
Admission: RE | Admit: 2023-03-31 | Discharge: 2023-03-31 | Disposition: A | Discharge status: Home / Self Care | Payer: PPO | Source: Ambulatory Visit | Attending: Radiation Oncology | Admitting: Radiation Oncology

## 2023-03-31 DIAGNOSIS — R918 Other nonspecific abnormal finding of lung field: Secondary | ICD-10-CM | POA: Diagnosis not present

## 2023-03-31 DIAGNOSIS — Z01818 Encounter for other preprocedural examination: Secondary | ICD-10-CM | POA: Diagnosis not present

## 2023-03-31 DIAGNOSIS — J4520 Mild intermittent asthma, uncomplicated: Secondary | ICD-10-CM | POA: Diagnosis not present

## 2023-03-31 DIAGNOSIS — C158 Malignant neoplasm of overlapping sites of esophagus: Secondary | ICD-10-CM | POA: Diagnosis not present

## 2023-03-31 DIAGNOSIS — J90 Pleural effusion, not elsewhere classified: Secondary | ICD-10-CM | POA: Diagnosis not present

## 2023-03-31 DIAGNOSIS — E876 Hypokalemia: Secondary | ICD-10-CM | POA: Diagnosis not present

## 2023-03-31 DIAGNOSIS — Z853 Personal history of malignant neoplasm of breast: Secondary | ICD-10-CM | POA: Diagnosis not present

## 2023-03-31 DIAGNOSIS — I3139 Other pericardial effusion (noninflammatory): Secondary | ICD-10-CM | POA: Diagnosis not present

## 2023-03-31 DIAGNOSIS — C50511 Malignant neoplasm of lower-outer quadrant of right female breast: Secondary | ICD-10-CM | POA: Diagnosis not present

## 2023-03-31 DIAGNOSIS — I10 Essential (primary) hypertension: Secondary | ICD-10-CM | POA: Diagnosis not present

## 2023-03-31 DIAGNOSIS — Z8501 Personal history of malignant neoplasm of esophagus: Secondary | ICD-10-CM | POA: Diagnosis not present

## 2023-03-31 DIAGNOSIS — Z9221 Personal history of antineoplastic chemotherapy: Secondary | ICD-10-CM | POA: Diagnosis not present

## 2023-03-31 DIAGNOSIS — Z923 Personal history of irradiation: Secondary | ICD-10-CM | POA: Diagnosis not present

## 2023-03-31 DIAGNOSIS — Z86711 Personal history of pulmonary embolism: Secondary | ICD-10-CM | POA: Diagnosis not present

## 2023-03-31 NOTE — Progress Notes (Signed)
  Radiation Oncology         [phone] ________________________________  Name: Jo Morrison MRN: [id number]  Date of Service: 03/31/2023  DOB: 1946/09/29  Post Treatment Telephone Note  Diagnosis:  Locally Recurrent Squamous Cell Carcinoma of the involving the cervical esophagus and a history of overlapping squamous cell carcinoma involving the proximal and middle third esophagus   Intent: Curative  Radiation Treatment Dates:    01/02/2023 through 02/10/2023 Site Technique Total Dose (Gy) Dose per Fx (Gy) Completed Fx Beam Energies  Esophagus: Esoph IMRT 45/45 1.8 25/25 6X  Esophagus: Esoph_Bst IMRT 5.4/5.4 1.8 3/3 6X   (as documented in provider EOT note)   The patient was available for call today.   Symptoms of fatigue have improved since completing therapy.  Symptoms of skin changes have improved since completing therapy.  Symptoms of esophagitis have improved since completing therapy.  The patient has scheduled follow up with her medical oncologist Dr. Truett Perna  for ongoing care, and was encouraged to call if she develops concerns or questions regarding radiation.   This concludes the interview.   Ruel Favors, LPN

## 2023-04-01 DIAGNOSIS — Z923 Personal history of irradiation: Secondary | ICD-10-CM | POA: Diagnosis not present

## 2023-04-01 DIAGNOSIS — K222 Esophageal obstruction: Secondary | ICD-10-CM | POA: Diagnosis not present

## 2023-04-01 DIAGNOSIS — Z8501 Personal history of malignant neoplasm of esophagus: Secondary | ICD-10-CM | POA: Diagnosis not present

## 2023-04-01 DIAGNOSIS — J4520 Mild intermittent asthma, uncomplicated: Secondary | ICD-10-CM | POA: Diagnosis not present

## 2023-04-01 DIAGNOSIS — Z86711 Personal history of pulmonary embolism: Secondary | ICD-10-CM | POA: Diagnosis not present

## 2023-04-01 DIAGNOSIS — K209 Esophagitis, unspecified without bleeding: Secondary | ICD-10-CM | POA: Diagnosis not present

## 2023-04-01 DIAGNOSIS — Z8616 Personal history of COVID-19: Secondary | ICD-10-CM | POA: Diagnosis not present

## 2023-04-01 DIAGNOSIS — Z79899 Other long term (current) drug therapy: Secondary | ICD-10-CM | POA: Diagnosis not present

## 2023-04-01 DIAGNOSIS — I10 Essential (primary) hypertension: Secondary | ICD-10-CM | POA: Diagnosis not present

## 2023-04-01 DIAGNOSIS — J90 Pleural effusion, not elsewhere classified: Secondary | ICD-10-CM | POA: Diagnosis not present

## 2023-04-01 DIAGNOSIS — Z853 Personal history of malignant neoplasm of breast: Secondary | ICD-10-CM | POA: Diagnosis not present

## 2023-04-01 DIAGNOSIS — Z91040 Latex allergy status: Secondary | ICD-10-CM | POA: Diagnosis not present

## 2023-04-01 DIAGNOSIS — Z7951 Long term (current) use of inhaled steroids: Secondary | ICD-10-CM | POA: Diagnosis not present

## 2023-04-01 DIAGNOSIS — Z9221 Personal history of antineoplastic chemotherapy: Secondary | ICD-10-CM | POA: Diagnosis not present

## 2023-04-07 ENCOUNTER — Ambulatory Visit: Payer: PPO | Admitting: Oncology

## 2023-04-08 DIAGNOSIS — H00011 Hordeolum externum right upper eyelid: Secondary | ICD-10-CM | POA: Diagnosis not present

## 2023-04-14 ENCOUNTER — Inpatient Hospital Stay: Payer: PPO | Attending: Oncology | Admitting: Oncology

## 2023-04-14 VITALS — BP 126/74 | HR 100 | Temp 98.1°F | Resp 18 | Ht 63.0 in | Wt 106.7 lb

## 2023-04-14 DIAGNOSIS — C158 Malignant neoplasm of overlapping sites of esophagus: Secondary | ICD-10-CM

## 2023-04-14 DIAGNOSIS — Z8501 Personal history of malignant neoplasm of esophagus: Secondary | ICD-10-CM | POA: Insufficient documentation

## 2023-04-14 DIAGNOSIS — Z923 Personal history of irradiation: Secondary | ICD-10-CM | POA: Insufficient documentation

## 2023-04-14 DIAGNOSIS — Z9221 Personal history of antineoplastic chemotherapy: Secondary | ICD-10-CM | POA: Diagnosis not present

## 2023-04-14 NOTE — Progress Notes (Signed)
[facility] OFFICE PROGRESS NOTE   Diagnosis: Esophagus cancer  INTERVAL HISTORY:   Ms. Lilienthal returns as scheduled.  She feels well.  No dysphagia.  Her appetite has improved.  She saw Dr. Glenna Durand on 03/31/2023 and underwent a restaging upper endoscopy on 04/01/2023.  She reports no tumor was seen.  Biopsies from the esophagus were negative for tumor.  Restaging CTs of the neck and chest showed no evidence of recurrent disease.  The esophagus mass has resolved.  She plans to follow-up with Dr. Glenna Durand in 3 months. Objective:  Vital signs in last 24 hours:  Blood pressure 126/74, pulse 100, temperature 98.1 F (36.7 C), temperature source Oral, resp. rate 18, height 5\' 3"  (1.6 m), weight 106 lb 11.2 oz (48.4 kg), SpO2 99 %.     Lymphatics: No cervical, supraclavicular, axillary, or inguinal nodes Resp: Lungs clear bilaterally Cardio: Regular rate and rhythm GI: No hepatosplenomegaly, nontender, no mass Vascular: No leg edema   Lab Results:  Lab Results  Component Value Date   WBC 2.9 (L) 02/12/2023   HGB 11.8 (L) 02/12/2023   HCT 35.2 (L) 02/12/2023   MCV 91.9 02/12/2023   PLT 180 02/12/2023   NEUTROABS 2.0 02/12/2023    CMP  Lab Results  Component Value Date   NA 140 02/12/2023   K 3.3 (L) 02/12/2023   CL 99 02/12/2023   CO2 34 (H) 02/12/2023   GLUCOSE 149 (H) 02/12/2023   BUN 16 02/12/2023   CREATININE 0.81 02/12/2023   CALCIUM 9.6 02/12/2023   PROT 6.5 02/12/2023   ALBUMIN 3.7 02/12/2023   AST 12 (L) 02/12/2023   ALT 7 02/12/2023   ALKPHOS 43 02/12/2023   BILITOT 0.8 02/12/2023   GFRNONAA >60 02/12/2023   GFRAA >60 02/21/2020     Medications: I have reviewed the patient's current medications.   Assessment/Plan: Squamous cell carcinoma of the upper/middle third of the esophagus Upper endoscopy 01/14/2020-ulcerated mass in the proximal/mid esophagus at 26-31 cm from the incisors, biopsy confirmed invasive moderately differentiated squamous cell  carcinoma, PD-L1 combined positive score 0% CT abdomen/pelvis 12/09/2019-uterine fibroids CT chest 12/30/2019-normal esophagus, new sclerosis of the medial head of the right clavicle likely degenerative PET scan 01/24/2020-long segment of hypermetabolic thickening of the distal esophagus.  For hypermetabolic metastatic lymph nodes, 3 in the mediastinum and 1 in the upper abdomen gastrohepatic ligament.  EUS 02/10/2020-ulcerated mass in the mid esophagus,uT3, 3 suspicious lymph nodes near the primary tumor, malignant appearing lymph node at the gastrohepatic ligament (level 18)-metastatic squamous cell carcinoma Radiation 01/31/2020 Cycle 1 weekly Taxol/carboplatin 02/01/2020 Cycle 2 weekly Taxol/carboplatin 02/08/2020 Cycle 3 weekly Taxol/carboplatin 02/15/2020 Cycle 4 weekly Taxol/carboplatin 02/22/2020 Cycle 5 weekly Taxol/carboplatin 02/29/2020 Radiation completed 03/08/2020 CTs 04/03/2020-esophageal mass appears grossly smaller, regression of previously noted lymphadenopathy in the mediastinum and upper abdomen, nonocclusive pulmonary embolus right-sided pulmonary arteries Upper endoscopy 05/02/2020-treatment effect visible starting approximately 26 cm from the incisors.  Scope passed easily into the stomach and proximal duodenum.  Stomach and proximal duodenum grossly normal.  Scope withdrawn to the mid esophagus with four-quadrant biopsies obtained.  2 biopsy showed acute ulcerative esophagitis; 2 biopsies showed squamous mucosa with minimal histologic abnormality. CTs 07/24/2020-no evidence of metastatic disease, left gastric lymph node has decreased in size, mild wall thickening of the mid to distal esophagus Upper endoscopy 07/25/2020-multiple biopsies with changes of ulcerative esophagitis, negative fungal, HSV, and CMV stains, no evidence of malignancy CTs at [facility] 11/27/2020-interval decrease in prior mediastinal groundglass opacities; decrease in  now mild diffuse esophageal wall thickening, most prominent in  the mid and distal esophagus; few scattered 3 mm and smaller noncalcified pulmonary nodules not significantly changed.  No definite new or enlarging pulmonary nodule.  No evidence of metastatic disease in the abdomen. EGD 11/28/2020-mild and discrete stricture in the distal esophagus; no mass lesions seen.  Biopsies taken, negative for malignancy x4; serial dilatation performed. CTs at [facility] 08/06/2021-no evidence of recurrent disease EGD 08/07/2021-no mass, mild stricture, multiple biopsies negative for carcinoma EGD at [facility] 12/25/2021-mild stricture, no mass, biopsies negative for carcinoma, 1 biopsy with possible low-grade dysplasia CTs 12/24/2021-negative for recurrent disease CTs at [facility] 07/01/2022-no evidence of recurrent disease Endoscopy 07/02/2022-negative for malignancy Progressive dysphagia/odynophagia status post EGD 12/17/2022-near complete obstruction UES, biopsy-moderately differentiated squamous cell carcinoma; PD-L1 CPS 45  PET scan at [facility] 12/18/2022-FDG avidity in the cervical esophagus.  No FDG avid metastatic disease. Radiation 01/02/2023-02/10/2023 Cycle 1 weekly Taxol/carboplatin 01/02/2023 Cycle 2 weekly Taxol/carboplatin 01/09/2023 Cycle 3 weekly Taxol/carboplatin 01/16/2023 Cycle 4 weekly Taxol/carboplatin 01/23/2023 Cycle 5 weekly Taxol/carboplatin 01/30/2023 CT neck 03/31/2023-resolution of upper esophagus mass, no evidence of metastatic disease CT chest 03/31/2023-similar wall thickening of the mid to distal esophagus, stable small bilateral pulmonary nodules, able small pericardial effusion, new small nonspecific bilateral pleural effusions Upper endoscopy 04/01/2023-multiple esophagus biopsies-esophagitis at 28 cm and 30 cm  Right breast cancer July 1996, [name], [name], right lumpectomy and axillary dissection followed by adjuvant Adriamycin/Taxol chemotherapy-4 cycles, 5 years of tamoxifen, and breast radiation   3.   Left breast DCIS January 2011 -1.1 cm, [name], [name],  left breast lumpectomy followed by adjuvant radiation and 5 years of tamoxifen   4.  Depression   5.  Hypertension   6.  Right eye uveitis, macular edema, and corneal edema   7.  Asthma   8.  Pulmonary embolus on CT 04/03/2020-lovenox initiated, anticoagulation changed to apixaban beginning 08/03/2020, anticoagulation completed approximately the beginning of December 2021   9.  Odynophagia secondary to radiation -resolved      Disposition: Ms.Radu appears well.  The restaging evaluation at [facility] is consistent with a complete clinical response to the repeat treatment with Taxol/carboplatin and radiation.  She plans to follow-up with Dr. Glenna Durand for a restaging evaluation in August.  She will return for an office visit here later in August.  Our plan was to deliver adjuvant nivolumab if there was evidence of persistent disease on the restaging evaluation.  In the absence of measurable disease I do not recommend nivolumab.    Thornton Papas, MD  04/14/2023  8:41 AM

## 2023-04-18 ENCOUNTER — Encounter: Payer: Self-pay | Admitting: Internal Medicine

## 2023-04-30 ENCOUNTER — Encounter: Payer: Self-pay | Admitting: Physician Assistant

## 2023-05-10 ENCOUNTER — Encounter: Payer: Self-pay | Admitting: Internal Medicine

## 2023-05-15 ENCOUNTER — Telehealth (INDEPENDENT_AMBULATORY_CARE_PROVIDER_SITE_OTHER): Payer: PPO | Admitting: Internal Medicine

## 2023-05-15 ENCOUNTER — Encounter: Payer: Self-pay | Admitting: Internal Medicine

## 2023-05-15 DIAGNOSIS — F419 Anxiety disorder, unspecified: Secondary | ICD-10-CM

## 2023-05-15 DIAGNOSIS — J4520 Mild intermittent asthma, uncomplicated: Secondary | ICD-10-CM | POA: Diagnosis not present

## 2023-05-15 DIAGNOSIS — K529 Noninfective gastroenteritis and colitis, unspecified: Secondary | ICD-10-CM | POA: Diagnosis not present

## 2023-05-15 MED ORDER — FLUTICASONE FUROATE-VILANTEROL 200-25 MCG/ACT IN AEPB: 1.0000 | INHALATION_SPRAY | Freq: Every day | RESPIRATORY_TRACT | 3 refills | Status: DC

## 2023-05-15 MED ORDER — MONTELUKAST SODIUM 10 MG PO TABS: 10.0000 mg | ORAL_TABLET | Freq: Every day | ORAL | 3 refills | Status: DC

## 2023-05-15 NOTE — Progress Notes (Signed)
Virtual Visit via Video Note  I connected with QUIANA COBAUGH on 05/15/23 at  9:40 AM EDT by a video enabled telemedicine application and verified that I am speaking with the correct person using two identifiers.  The patient and the provider were at separate locations throughout the entire encounter. Patient location: home, Provider location: work   I discussed the limitations of evaluation and management by telemedicine and the availability of in person appointments. The patient expressed understanding and agreed to proceed. The patient and the provider were the only parties present for the visit unless noted in HPI below.  History of Present Illness: The patient is a 77 y.o. female with visit for diarrhea intermittent for 2 weeks. No chemo since around March and last check cancer in remission. No change to diet or medications. Resolved for 1-2 days.  Observations/Objective: Appearance: normal, breathing appears normal, abdomen does not appear distended  Assessment and Plan: See problem oriented charting  Follow Up Instructions: refill singulair and breo, observe and if symptoms recur consider C dif testing  I discussed the assessment and treatment plan with the patient. The patient was provided an opportunity to ask questions and all were answered. The patient agreed with the plan and demonstrated an understanding of the instructions.   The patient was advised to call back or seek an in-person evaluation if the symptoms worsen or if the condition fails to improve as anticipated.  Myrlene Broker, MD

## 2023-05-16 ENCOUNTER — Encounter: Payer: Self-pay | Admitting: Internal Medicine

## 2023-05-16 NOTE — Assessment & Plan Note (Signed)
Taking clonazepam prn and effexor and trazodone. Well controlled will continue current regimen.

## 2023-05-16 NOTE — Assessment & Plan Note (Signed)
Overall had been resolved but recent 1-2 weeks of intermittent diarrhea. Husband had GI bug but recovered quickly. If recurrent could consider C dif testing due to recent chemo completed March 2024.

## 2023-05-16 NOTE — Assessment & Plan Note (Signed)
Refilled breo and no falre today. Uses singulair as well which was refilled also.

## 2023-06-04 ENCOUNTER — Encounter: Payer: Self-pay | Admitting: Oncology

## 2023-06-12 ENCOUNTER — Ambulatory Visit: Payer: PPO

## 2023-06-12 VITALS — Ht 63.0 in | Wt 110.0 lb

## 2023-06-12 DIAGNOSIS — Z0000 Encounter for general adult medical examination without abnormal findings: Secondary | ICD-10-CM

## 2023-06-12 NOTE — Progress Notes (Signed)
Subjective:   Jo Morrison is a 77 y.o. female who presents for Medicare Annual (Subsequent) preventive examination.  Visit Complete: Virtual  I connected with  BANEZA BARTOSZEK on 06/12/23 by a audio enabled telemedicine application and verified that I am speaking with the correct person using two identifiers.  Patient Location: Home  Provider Location: Office/Clinic  I discussed the limitations of evaluation and management by telemedicine. The patient expressed understanding and agreed to proceed.  Patient Medicare AWV questionnaire was completed by the patient on 06/09/2023; I have confirmed that all information answered by patient is correct and no changes since this date.  Review of Systems     Cardiac Risk Factors include: advanced age (>69men, >75 women);family history of premature cardiovascular disease;hypertension     Objective:    Today's Vitals   06/12/23 0909 06/12/23 0910  Weight: 110 lb (49.9 kg)   Height: 5\' 3"  (1.6 m)   PainSc: 0-No pain 0-No pain   Body mass index is 19.49 kg/m.     06/12/2023    9:05 AM 02/12/2023   11:25 AM 01/30/2023   12:40 PM 01/23/2023   11:08 AM 01/09/2023   11:13 AM 01/09/2023   10:19 AM 01/02/2023    9:37 AM  Advanced Directives  Does Patient Have a Medical Advance Directive? Yes Yes Yes No Yes Yes Yes  Type of Estate agent of [place];Living will Healthcare Power of [place];Living will Healthcare Power of [place];Living will Living will;Healthcare Power of [employer] Power of [place];Living will Healthcare Power of [place];Living will Living will;Healthcare Power of Attorney  Does patient want to make changes to medical advance directive?  No - Patient declined No - Patient declined No - Patient declined No - Patient declined No - Patient declined No - Patient declined  Copy of Healthcare Power of Attorney in Chart? Yes - validated most recent copy scanned in chart (See row information) Yes - validated  most recent copy scanned in chart (See row information) Yes - validated most recent copy scanned in chart (See row information)      Would patient like information on creating a medical advance directive?    No - Patient declined       Current Medications (verified) Outpatient Encounter Medications as of 06/12/2023  Medication Sig   brimonidine (ALPHAGAN) 0.2 % ophthalmic solution Place 1 drop into the right eye 3 (three) times daily.   clonazePAM (KLONOPIN) 0.5 MG tablet Take 0.5-1 tablets (0.25-0.5 mg total) by mouth 2 (two) times daily as needed.   dorzolamide-timolol (COSOPT) 22.3-6.8 MG/ML ophthalmic solution Place 1 drop into the right eye 2 (two) times daily.    fluticasone (FLONASE) 50 MCG/ACT nasal spray Place 2 sprays into the nose daily as needed for allergies.    fluticasone furoate-vilanterol (BREO ELLIPTA) 200-25 MCG/ACT AEPB Inhale 1 puff into the lungs daily.   hydrochlorothiazide (HYDRODIURIL) 25 MG tablet TAKE 1 TABLET(25 MG) BY MOUTH DAILY   HYDROcodone-acetaminophen (HYCET) 7.5-325 mg/15 ml solution Take 10 mLs by mouth 4 (four) times daily as needed for moderate pain.   loteprednol (LOTEMAX) 0.5 % ophthalmic suspension Place 1 drop into the right eye 4 (four) times daily.   montelukast (SINGULAIR) 10 MG tablet Take 1 tablet (10 mg total) by mouth at bedtime.   ondansetron (ZOFRAN-ODT) 8 MG disintegrating tablet Take 1 tablet (8 mg total) by mouth every 8 (eight) hours as needed for nausea or vomiting.   potassium chloride 20 MEQ/15ML (10%) SOLN TAKE 15  ML BY MOUTH DAILY   prochlorperazine (COMPAZINE) 10 MG tablet Take 1 tablet (10 mg total) by mouth every 6 (six) hours as needed for nausea or vomiting.   REFRESH OPTIVE ADVANCED 0.5-1-0.5 % SOLN Place 1 drop into both eyes daily as needed (itching Eyes).    sucralfate (CARAFATE) 1 g tablet CRUSH 1 TABLET IN 1OZ OF WATER AND DRINK 5 MINUTES BEFORE MEALS FOUR TIMES DAILY   traZODone (DESYREL) 100 MG tablet Take 1-2 tablets  (100-200 mg total) by mouth at bedtime as needed for sleep.   venlafaxine XR (EFFEXOR-XR) 150 MG 24 hr capsule Take 1 capsule (150 mg total) by mouth daily.   No facility-administered encounter medications on file as of 06/12/2023.    Allergies (verified) Latex   History: Past Medical History:  Diagnosis Date   Allergy    Anxiety    Asthma    Breast cancer (HCC) 2012   left   Breast cancer, right (HCC) 1996   right   Cataract    cataracts removed   Chronic uveitis of right eye    Colon polyps    Corneal edema of right eye    Cystoid macular edema of right eye    Depressive disorder    DVT (deep venous thrombosis) (HCC)    Family history of colon cancer    Glaucoma    not on eye drops at this time.   Hypertension    Hypertensive retinopathy of both eyes    Olecranon bursitis    Osteoarthritis    Other disorders of bone and cartilage(733.99)    Personal history of chemotherapy    1996   Personal history of radiation therapy    1996 & 2011   Pseudoexfoliation (PXF) glaucoma of both eyes    Pseudophakia, both eyes    Seasonal allergies    Unspecified pruritic disorder    Past Surgical History:  Procedure Laterality Date   APPENDECTOMY  1958   BREAST LUMPECTOMY Right 1996   Dr. Corliss Marcus in [place]   BREAST LUMPECTOMY Left 2011   Dr. Marisue Humble   cataract Bilateral    CATARACT EXTRACTION Bilateral    Dr. Delaney Meigs   COLONOSCOPY  2011-last   DUPUYTREN CONTRACTURE RELEASE     ESOPHAGOGASTRODUODENOSCOPY (EGD) WITH PROPOFOL N/A 02/10/2020   Procedure: ESOPHAGOGASTRODUODENOSCOPY (EGD) WITH PROPOFOL;  Surgeon: Rachael Fee, MD;  Location: [facility];  Service: Endoscopy;  Laterality: N/A;   FINE NEEDLE ASPIRATION N/A 02/10/2020   Procedure: FINE NEEDLE ASPIRATION (FNA) LINEAR;  Surgeon: Rachael Fee, MD;  Location: [facility];  Service: Endoscopy;  Laterality: N/A;   HAND SURGERY Right 2020   LASIK Bilateral 2007   POLYPECTOMY     ROTATOR CUFF REPAIR   2007   TONSILLECTOMY  1967   UPPER ESOPHAGEAL ENDOSCOPIC ULTRASOUND (EUS) N/A 02/10/2020   Procedure: UPPER ESOPHAGEAL ENDOSCOPIC ULTRASOUND (EUS);  Surgeon: Rachael Fee, MD;  Location: [facility];  Service: Endoscopy;  Laterality: N/A;   Family History  Problem Relation Age of Onset   Stroke Father 73   Heart disease Brother    Colon cancer Maternal Aunt 67   Macular degeneration Mother    Cancer Maternal Grandmother        unknown type   Colon polyps Neg Hx    Esophageal cancer Neg Hx    Rectal cancer Neg Hx    Stomach cancer Neg Hx    Social History   Socioeconomic History   Marital status: Married  Spouse name: Not on file   Number of children: 0   Years of education: Not on file   Highest education level: Not on file  Occupational History   Not on file  Tobacco Use   Smoking status: Never   Smokeless tobacco: Never  Vaping Use   Vaping status: Never Used  Substance and Sexual Activity   Alcohol use: Yes    Alcohol/week: 6.0 standard drinks of alcohol    Types: 6 Glasses of wine per week    Comment: daily   Drug use: No   Sexual activity: Yes  Other Topics Concern   Not on file  Social History Narrative   Married no children   Wine 3 to 4 glasses a day   Never smoker   No drug use   Social Determinants of Health   Financial Resource Strain: Low Risk  (06/12/2023)   Overall Financial Resource Strain (CARDIA)    Difficulty of Paying Living Expenses: Not hard at all  Food Insecurity: No Food Insecurity (06/12/2023)   Hunger Vital Sign    Worried About Running Out of Food in the Last Year: Never true    Ran Out of Food in the Last Year: Never true  Transportation Needs: No Transportation Needs (06/12/2023)   PRAPARE - Administrator, Civil Service (Medical): No    Lack of Transportation (Non-Medical): No  Physical Activity: Insufficiently Active (06/12/2023)   Exercise Vital Sign    Days of Exercise per Week: 2 days    Minutes of Exercise  per Session: 30 min  Stress: No Stress Concern Present (06/12/2023)   [employer] of Occupational Health - Occupational Stress Questionnaire    Feeling of Stress : Not at all  Social Connections: Unknown (06/12/2023)   Social Connection and Isolation Panel [NHANES]    Frequency of Communication with Friends and Family: Once a week    Frequency of Social Gatherings with Friends and Family: Once a week    Attends Religious Services: Not on Marketing executive or Organizations: Yes    Attends Banker Meetings: Never    Marital Status: Married    Tobacco Counseling Counseling given: Not Answered   Clinical Intake:  Pre-visit preparation completed: Yes  Pain : No/denies pain Pain Score: 0-No pain     BMI - recorded: 19.49 Nutritional Status: BMI of 19-24  Normal Nutritional Risks: None Diabetes: No  How often do you need to have someone help you when you read instructions, pamphlets, or other written materials from your doctor or pharmacy?: 1 - Never What is the last grade level you completed in school?: Bachelor's Degree  Interpreter Needed?: No  Information entered by :: Prisilla Kocsis N. Itzy Adler, LPN.   Activities of Daily Living    06/12/2023    9:15 AM 06/09/2023    9:41 PM  In your present state of health, do you have any difficulty performing the following activities:  Hearing? 0 0  Vision? 0 0  Difficulty concentrating or making decisions? 0 0  Walking or climbing stairs? 0 0  Dressing or bathing? 0 0  Doing errands, shopping? 0 0  Preparing Food and eating ? N N  Using the Toilet? N N  In the past six months, have you accidently leaked urine? Y Y  Do you have problems with loss of bowel control? Y Y  Managing your Medications? N N  Managing your Finances? N N  Housekeeping or managing  your Housekeeping? N N    Patient Care Team: Myrlene Broker, MD as PCP - General (Internal Medicine) Serena Croissant, MD as Consulting  Physician (Hematology and Oncology) Iva Boop, MD as Consulting Physician (Gastroenterology) Radonna Ricker, RN (Inactive) as Oncology Nurse Navigator Ladene Artist, MD as Consulting Physician (Oncology)  Indicate any recent Medical Services you may have received from other than [facility] providers in the past year (date may be approximate).     Assessment:   This is a routine wellness examination for Shreya.  Hearing/Vision screen Hearing Screening - Comments:: Patient denied any hearing difficulty.   No hearing aids.  Vision Screening - Comments:: Patient does wear corrective lenses/contacts.  Annual eye exam done by: Sharyn Dross, MD. Cornea Specialist: [facility]   Dietary issues and exercise activities discussed:     Goals Addressed             This Visit's Progress    My healthcare goal is to continue to  work on my strength, stability and work with my personal trainer twice a week.        Depression Screen    06/12/2023    9:05 AM 05/15/2023    9:36 AM 10/25/2022    9:13 AM 08/19/2022    3:33 PM 04/03/2022   10:13 AM 01/01/2022    1:03 PM 03/12/2021    2:54 PM  PHQ 2/9 Scores  PHQ - 2 Score 0 0 0 0 0 0 0  PHQ- 9 Score 0 0 0   0     Fall Risk    06/12/2023    9:15 AM 06/09/2023    9:41 PM 05/15/2023    9:35 AM 10/25/2022    9:13 AM 08/19/2022    3:33 PM  Fall Risk   Falls in the past year? 1 1 0 0 0  Number falls in past yr: 0 0 0 0 0  Injury with Fall? 0 0 0 0   Follow up Falls evaluation completed;Education provided  Falls evaluation completed Falls evaluation completed     MEDICARE RISK AT HOME:  Medicare Risk at Home - 06/12/23 0916     Any stairs in or around the home? Yes    If so, are there any without handrails? Yes    Home free of loose throw rugs in walkways, pet beds, electrical cords, etc? Yes    Adequate lighting in your home to reduce risk of falls? Yes    Life alert? Yes    Use of a cane, walker or w/c? No    Grab  bars in the bathroom? No    Shower chair or bench in shower? No    Elevated toilet seat or a handicapped toilet? No             TIMED UP AND GO:  Was the test performed?  No    Cognitive Function:        06/12/2023    9:16 AM 04/03/2022   10:25 AM  6CIT Screen  What Year? 0 points 0 points  What month? 0 points 0 points  What time? 0 points 0 points  Count back from 20 0 points 0 points  Months in reverse 0 points 0 points  Repeat phrase 0 points 0 points  Total Score 0 points 0 points    Immunizations Immunization History  Administered Date(s) Administered   Fluad Quad(high Dose 65+) 08/12/2021, 08/11/2022   Influenza Split 08/20/2020  Influenza, High Dose Seasonal PF 08/09/2016, 09/09/2017, 09/07/2018, 08/01/2019   Influenza,inj,Quad PF,6+ Mos 08/10/2015   Influenza-Unspecified 08/10/2015, 08/01/2019, 02/18/2020, 08/20/2020   Moderna Sars-Covid-2 Vaccination 09/22/2021   PFIZER(Purple Top)SARS-COV-2 Vaccination 12/05/2019, 12/26/2019, 08/03/2020   Pneumococcal Conjugate-13 12/13/2014   Pneumococcal Polysaccharide-23 06/16/2013   Respiratory Syncytial Virus Vaccine,Recomb Aduvanted(Arexvy) 08/11/2022   Tdap 11/25/1998, 08/10/2015   Zoster Recombinant(Shingrix) 06/22/2017, 09/10/2017   Zoster, Live 05/27/2012    TDAP status: Up to date  Flu Vaccine status: Up to date  Pneumococcal vaccine status: Up to date  Covid-19 vaccine status: Completed vaccines  Qualifies for Shingles Vaccine? Yes   Zostavax completed Yes   Shingrix Completed?: Yes  Screening Tests Health Maintenance  Topic Date Due   COVID-19 Vaccine (5 - 2023-24 season) 07/26/2022   Colonoscopy  03/13/2023   Hepatitis C Screening  08/16/2026 (Originally 01/25/1964)   INFLUENZA VACCINE  06/26/2023   Medicare Annual Wellness (AWV)  06/11/2024   DTaP/Tdap/Td (3 - Td or Tdap) 08/09/2025   Pneumonia Vaccine 1+ Years old  Completed   DEXA SCAN  Completed   Zoster Vaccines- Shingrix  Completed    HPV VACCINES  Aged Out    Health Maintenance  Health Maintenance Due  Topic Date Due   COVID-19 Vaccine (5 - 2023-24 season) 07/26/2022   Colonoscopy  03/13/2023    Colorectal cancer screening: Type of screening: Colonoscopy. Completed 03/12/2018. Repeat every 5 years  (Consult scheduled for 07/16/2023)  Mammogram status: Completed 01/21/2022. Repeat every year History of left breast cancer)  Bone Density status: Completed 06/21/2014. Results reflect: Bone density results: NORMAL. Repeat every 06/21/2014 years.  Lung Cancer Screening: (Low Dose CT Chest recommended if Age 52-80 years, 20 pack-year currently smoking OR have quit w/in 15years.) does not qualify.   Lung Cancer Screening Referral: no  Additional Screening:  Hepatitis C Screening: does qualify; Completed: postponed  Vision Screening: Recommended annual ophthalmology exams for early detection of glaucoma and other disorders of the eye. Is the patient up to date with their annual eye exam?  Yes  Who is the provider or what is the name of the office in which the patient attends annual eye exams? Sharyn Dross, MD. If pt is not established with a provider, would they like to be referred to a provider to establish care? No .   Dental Screening: Recommended annual dental exams for proper oral hygiene  Diabetic Foot Exam: N/A  Community Resource Referral / Chronic Care Management: CRR required this visit?  No   CCM required this visit?  No     Plan:     I have personally reviewed and noted the following in the patient's chart:   Medical and social history Use of alcohol, tobacco or illicit drugs  Current medications and supplements including opioid prescriptions. Patient is currently taking opioid prescriptions. Information provided to patient regarding non-opioid alternatives. Patient advised to discuss non-opioid treatment plan with their provider. Functional ability and status Nutritional status Physical  activity Advanced directives List of other physicians Hospitalizations, surgeries, and ER visits in previous 12 months Vitals Screenings to include cognitive, depression, and falls Referrals and appointments  In addition, I have reviewed and discussed with patient certain preventive protocols, quality metrics, and best practice recommendations. A written personalized care plan for preventive services as well as general preventive health recommendations were provided to patient.     Mickeal Needy, LPN   12/25/8655   After Visit Summary: (Mail) Due to this being a telephonic visit, the after visit  summary with patients personalized plan was offered to patient via mail   Nurse Notes: Normal cognitive status assessed by direct observation via telephone conversation by this Nurse Health Advisor. No abnormalities found.

## 2023-06-12 NOTE — Patient Instructions (Addendum)
Jo Morrison , Thank you for taking time to come for your Medicare Wellness Visit. I appreciate your ongoing commitment to your health goals. Please review the following plan we discussed and let me know if I can assist you in the future.   These are the goals we discussed:  Goals      My healthcare goal is to continue to  work on my strength, stability and work with my Systems analyst twice a week.        This is a list of the screening recommended for you and due dates:  Health Maintenance  Topic Date Due   COVID-19 Vaccine (5 - 2023-24 season) 07/26/2022   Colon Cancer Screening  03/13/2023   Hepatitis C Screening  08/16/2026*   Flu Shot  06/26/2023   Medicare Annual Wellness Visit  06/11/2024   DTaP/Tdap/Td vaccine (3 - Td or Tdap) 08/09/2025   Pneumonia Vaccine  Completed   DEXA scan (bone density measurement)  Completed   Zoster (Shingles) Vaccine  Completed   HPV Vaccine  Aged Out  *Topic was postponed. The date shown is not the original due date.    Advanced directives: Yes; documents on file.  Conditions/risks identified: Yes  Next appointment: It was nice speaking with you today!  Please follow up in one year for your annual wellness visit via telephone call with Nurse Percell Miller on 06/15/2024 at 8:45 a.m.  If you need to cancel or reschedule please call [phone].    Preventive Care 34 Years and Older, Female Preventive care refers to lifestyle choices and visits with your health care provider that can promote health and wellness. What does preventive care include? A yearly physical exam. This is also called an annual well check. Dental exams once or twice a year. Routine eye exams. Ask your health care provider how often you should have your eyes checked. Personal lifestyle choices, including: Daily care of your teeth and gums. Regular physical activity. Eating a healthy diet. Avoiding tobacco and drug use. Limiting alcohol use. Practicing safe sex. Taking  low-dose aspirin every day. Taking vitamin and mineral supplements as recommended by your health care provider. What happens during an annual well check? The services and screenings done by your health care provider during your annual well check will depend on your age, overall health, lifestyle risk factors, and family history of disease. Counseling  Your health care provider may ask you questions about your: Alcohol use. Tobacco use. Drug use. Emotional well-being. Home and relationship well-being. Sexual activity. Eating habits. History of falls. Memory and ability to understand (cognition). Work and work Astronomer. Reproductive health. Screening  You may have the following tests or measurements: Height, weight, and BMI. Blood pressure. Lipid and cholesterol levels. These may be checked every 5 years, or more frequently if you are over 16 years old. Skin check. Lung cancer screening. You may have this screening every year starting at age 77 if you have a 30-pack-year history of smoking and currently smoke or have quit within the past 15 years. Fecal occult blood test (FOBT) of the stool. You may have this test every year starting at age 77. Flexible sigmoidoscopy or colonoscopy. You may have a sigmoidoscopy every 5 years or a colonoscopy every 10 years starting at age 45. Hepatitis C blood test. Hepatitis B blood test. Sexually transmitted disease (STD) testing. Diabetes screening. This is done by checking your blood sugar (glucose) after you have not eaten for a while (fasting). You may have this  done every 1-3 years. Bone density scan. This is done to screen for osteoporosis. You may have this done starting at age 77. Mammogram. This may be done every 1-2 years. Talk to your health care provider about how often you should have regular mammograms. Talk with your health care provider about your test results, treatment options, and if necessary, the need for more tests. Vaccines   Your health care provider may recommend certain vaccines, such as: Influenza vaccine. This is recommended every year. Tetanus, diphtheria, and acellular pertussis (Tdap, Td) vaccine. You may need a Td booster every 10 years. Zoster vaccine. You may need this after age 77. Pneumococcal 13-valent conjugate (PCV13) vaccine. One dose is recommended after age 77. Pneumococcal polysaccharide (PPSV23) vaccine. One dose is recommended after age 77. Talk to your health care provider about which screenings and vaccines you need and how often you need them. This information is not intended to replace advice given to you by your health care provider. Make sure you discuss any questions you have with your health care provider. Document Released: 12/08/2015 Document Revised: 07/31/2016 Document Reviewed: 09/12/2015 Elsevier Interactive Patient Education  2017 [employer].  Fall Prevention in the Home Falls can cause injuries. They can happen to people of all ages. There are many things you can do to make your home safe and to help prevent falls. What can I do on the outside of my home? Regularly fix the edges of walkways and driveways and fix any cracks. Remove anything that might make you trip as you walk through a door, such as a raised step or threshold. Trim any bushes or trees on the path to your home. Use bright outdoor lighting. Clear any walking paths of anything that might make someone trip, such as rocks or tools. Regularly check to see if handrails are loose or broken. Make sure that both sides of any steps have handrails. Any raised decks and porches should have guardrails on the edges. Have any leaves, snow, or ice cleared regularly. Use sand or salt on walking paths during winter. Clean up any spills in your garage right away. This includes oil or grease spills. What can I do in the bathroom? Use night lights. Install grab bars by the toilet and in the tub and shower. Do not use towel  bars as grab bars. Use non-skid mats or decals in the tub or shower. If you need to sit down in the shower, use a plastic, non-slip stool. Keep the floor dry. Clean up any water that spills on the floor as soon as it happens. Remove soap buildup in the tub or shower regularly. Attach bath mats securely with double-sided non-slip rug tape. Do not have throw rugs and other things on the floor that can make you trip. What can I do in the bedroom? Use night lights. Make sure that you have a light by your bed that is easy to reach. Do not use any sheets or blankets that are too big for your bed. They should not hang down onto the floor. Have a firm chair that has side arms. You can use this for support while you get dressed. Do not have throw rugs and other things on the floor that can make you trip. What can I do in the kitchen? Clean up any spills right away. Avoid walking on wet floors. Keep items that you use a lot in easy-to-reach places. If you need to reach something above you, use a strong step stool that  has a grab bar. Keep electrical cords out of the way. Do not use floor polish or wax that makes floors slippery. If you must use wax, use non-skid floor wax. Do not have throw rugs and other things on the floor that can make you trip. What can I do with my stairs? Do not leave any items on the stairs. Make sure that there are handrails on both sides of the stairs and use them. Fix handrails that are broken or loose. Make sure that handrails are as long as the stairways. Check any carpeting to make sure that it is firmly attached to the stairs. Fix any carpet that is loose or worn. Avoid having throw rugs at the top or bottom of the stairs. If you do have throw rugs, attach them to the floor with carpet tape. Make sure that you have a light switch at the top of the stairs and the bottom of the stairs. If you do not have them, ask someone to add them for you. What else can I do to help  prevent falls? Wear shoes that: Do not have high heels. Have rubber bottoms. Are comfortable and fit you well. Are closed at the toe. Do not wear sandals. If you use a stepladder: Make sure that it is fully opened. Do not climb a closed stepladder. Make sure that both sides of the stepladder are locked into place. Ask someone to hold it for you, if possible. Clearly mark and make sure that you can see: Any grab bars or handrails. First and last steps. Where the edge of each step is. Use tools that help you move around (mobility aids) if they are needed. These include: Canes. Walkers. Scooters. Crutches. Turn on the lights when you go into a dark area. Replace any light bulbs as soon as they burn out. Set up your furniture so you have a clear path. Avoid moving your furniture around. If any of your floors are uneven, fix them. If there are any pets around you, be aware of where they are. Review your medicines with your doctor. Some medicines can make you feel dizzy. This can increase your chance of falling. Ask your doctor what other things that you can do to help prevent falls. This information is not intended to replace advice given to you by your health care provider. Make sure you discuss any questions you have with your health care provider. Document Released: 09/07/2009 Document Revised: 04/18/2016 Document Reviewed: 12/16/2014 Elsevier Interactive Patient Education  2017 [employer].

## 2023-06-16 ENCOUNTER — Other Ambulatory Visit: Payer: Self-pay

## 2023-06-18 DIAGNOSIS — H00011 Hordeolum externum right upper eyelid: Secondary | ICD-10-CM | POA: Diagnosis not present

## 2023-06-18 DIAGNOSIS — H0288A Meibomian gland dysfunction right eye, upper and lower eyelids: Secondary | ICD-10-CM | POA: Diagnosis not present

## 2023-06-18 DIAGNOSIS — Z947 Corneal transplant status: Secondary | ICD-10-CM | POA: Diagnosis not present

## 2023-06-18 DIAGNOSIS — H0288B Meibomian gland dysfunction left eye, upper and lower eyelids: Secondary | ICD-10-CM | POA: Diagnosis not present

## 2023-06-23 DIAGNOSIS — H401431 Capsular glaucoma with pseudoexfoliation of lens, bilateral, mild stage: Secondary | ICD-10-CM | POA: Diagnosis not present

## 2023-06-23 DIAGNOSIS — H524 Presbyopia: Secondary | ICD-10-CM | POA: Diagnosis not present

## 2023-06-23 DIAGNOSIS — H2000 Unspecified acute and subacute iridocyclitis: Secondary | ICD-10-CM | POA: Diagnosis not present

## 2023-06-23 DIAGNOSIS — H35351 Cystoid macular degeneration, right eye: Secondary | ICD-10-CM | POA: Diagnosis not present

## 2023-06-23 DIAGNOSIS — H04123 Dry eye syndrome of bilateral lacrimal glands: Secondary | ICD-10-CM | POA: Diagnosis not present

## 2023-06-25 ENCOUNTER — Telehealth: Payer: PPO | Admitting: Nurse Practitioner

## 2023-06-25 DIAGNOSIS — M25579 Pain in unspecified ankle and joints of unspecified foot: Secondary | ICD-10-CM

## 2023-06-25 NOTE — Progress Notes (Signed)
Sherrilyn Rist, Thank you for submitting an e-visit request. Our protocol is to treat Gout episodes in patients that have been diagnosed in the past.   Since you have not had a prior episode of Gout confirmed, we would recommend an evaluation with your primary care provider for confirmation of this possibility.   I feel your condition warrants further evaluation and I recommend that you be seen for a face to face visit.  Please contact your primary care physician practice to be seen. Many offices offer virtual options to be seen via video if you are not comfortable going in person to a medical facility at this time.  NOTE: You will NOT be charged for this eVisit.  If you do not have a PCP,  offers a free physician referral service available at [phone]. Our trained staff has the experience, knowledge and resources to put you in touch with a physician who is right for you.    If you are having a true medical emergency please call 911.   Your e-visit answers were reviewed by a board certified advanced clinical practitioner to complete your personal care plan.  Thank you for using e-Visits.

## 2023-07-01 ENCOUNTER — Encounter: Payer: Self-pay | Admitting: Emergency Medicine

## 2023-07-01 ENCOUNTER — Ambulatory Visit (INDEPENDENT_AMBULATORY_CARE_PROVIDER_SITE_OTHER): Payer: PPO

## 2023-07-01 ENCOUNTER — Ambulatory Visit (INDEPENDENT_AMBULATORY_CARE_PROVIDER_SITE_OTHER): Payer: PPO | Admitting: Emergency Medicine

## 2023-07-01 VITALS — BP 122/68 | HR 90 | Temp 98.4°F | Ht 63.0 in | Wt 109.0 lb

## 2023-07-01 DIAGNOSIS — M25472 Effusion, left ankle: Secondary | ICD-10-CM | POA: Insufficient documentation

## 2023-07-01 DIAGNOSIS — M7989 Other specified soft tissue disorders: Secondary | ICD-10-CM | POA: Diagnosis not present

## 2023-07-01 DIAGNOSIS — M25572 Pain in left ankle and joints of left foot: Secondary | ICD-10-CM | POA: Diagnosis not present

## 2023-07-01 NOTE — Assessment & Plan Note (Signed)
Pain much improved but still persistent mild swelling. Unremarkable x-ray. No signs of gout or reactive arthritis. Most likely mild sprain.  Healing well.  No complications. No concerns at this time.

## 2023-07-01 NOTE — Patient Instructions (Signed)
Ankle Pain The ankle joint helps you stand on your leg and allows you to move around. Ankle pain can happen on either side or the back of the ankle. You may have pain in one ankle or both ankles. Ankle pain may be sharp and burning or dull and aching. There may be tenderness, stiffness, redness, or warmth around the ankle. Many things can cause ankle pain. These include an injury to the area and overuse of your ankle. Follow these instructions at home: Activity Rest your ankle as told by your health care provider. Avoid doing things that cause ankle pain. Do not use the injured limb to support your body weight until your provider says that you can. Use crutches as told by your provider. Ask your provider when it is safe to drive if you have a brace on your ankle. Do exercises as told by your provider. If you have a removable brace: Wear the brace as told by your provider. Remove it only as told by your provider. Check the skin around the brace every day. Tell your provider about any concerns. Loosen the brace if your toes tingle, become numb, or turn cold and blue. Keep the brace clean. If the brace is not waterproof: Do not let it get wet. Cover it with a watertight covering when you take a bath or shower. If you have an elastic bandage:  Remove it when you take a bath or a shower. Try not to move your ankle much. Wiggle your toes from time to time. This helps to prevent swelling. Adjust the bandage if it feels too tight. Loosen the bandage if your foot tingles, becomes numb, or turns cold and blue. Managing pain, stiffness, and swelling  If told, put ice on the painful area. If you have a removable brace or elastic bandage, remove it as told by your provider. Put ice in a plastic bag. Place a towel between your skin and the bag. Leave the ice on for 20 minutes, 2-3 times a day. If your skin turns bright red, remove the ice right away to prevent skin damage. The risk of damage is  higher if you cannot feel pain, heat, or cold. Move your toes often to reduce stiffness and swelling. Raise (elevate) your ankle above the level of your heart while you are sitting or lying down. General instructions Take over-the-counter and prescription medicines only as told by your provider. To help you and your provider, write down: How often you have ankle pain. Where the pain is. What the pain feels like. If you are told to wear a certain shoe or insole, make sure you wear it the right way and for as long as you are told. Contact a health care provider if: Your pain gets worse. Your pain does not get better with medicine. You have a fever or chills. You have more trouble walking. You have new symptoms. Your foot, leg, toes, or ankle tingles, becomes numb or swollen, or turns cold and blue. This information is not intended to replace advice given to you by your health care provider. Make sure you discuss any questions you have with your health care provider. Document Revised: 09/04/2022 Document Reviewed: 09/04/2022 Elsevier Patient Education  2024 [employer].

## 2023-07-01 NOTE — Progress Notes (Signed)
Jo Morrison 77 y.o.   Chief Complaint  Patient presents with   Foot Swelling    Patient states she has some swelling in her left ankle, painful and sore about 1 week ago, pain is gone, but swelling still there.     HISTORY OF PRESENT ILLNESS: Acute problem visit today.  Patient of Dr. Hillard Danker. This is a 77 y.o. female complaining of pain and swelling to left ankle that started a little over a week ago progressively getting better but still having residual swelling. No other associated symptoms.  No other complaints or medical concerns today.  HPI   Prior to Admission medications   Medication Sig Start Date End Date Taking? Authorizing Provider  brimonidine (ALPHAGAN) 0.2 % ophthalmic solution Place 1 drop into the right eye 3 (three) times daily. 12/23/22  Yes [provider]  clonazePAM (KLONOPIN) 0.5 MG tablet Take 0.5-1 tablets (0.25-0.5 mg total) by mouth 2 (two) times daily as needed. 02/15/22  Yes Hurst, Rosey Bath T, PA-C  dorzolamide-timolol (COSOPT) 22.3-6.8 MG/ML ophthalmic solution Place 1 drop into the right eye 2 (two) times daily.    Yes [provider]  fluticasone (FLONASE) 50 MCG/ACT nasal spray Place 2 sprays into the nose daily as needed for allergies.  07/20/18  Yes [provider]  fluticasone furoate-vilanterol (BREO ELLIPTA) 200-25 MCG/ACT AEPB Inhale 1 puff into the lungs daily. 05/15/23  Yes Myrlene Broker, MD  hydrochlorothiazide (HYDRODIURIL) 25 MG tablet TAKE 1 TABLET(25 MG) BY MOUTH DAILY 10/24/22  Yes Myrlene Broker, MD  loteprednol (LOTEMAX) 0.5 % ophthalmic suspension Place 1 drop into the right eye 4 (four) times daily. 01/17/20  Yes [provider]  montelukast (SINGULAIR) 10 MG tablet Take 1 tablet (10 mg total) by mouth at bedtime. 05/15/23  Yes Myrlene Broker, MD  potassium chloride 20 MEQ/15ML (10%) SOLN TAKE 15 ML BY MOUTH DAILY 02/05/23  Yes Ladene Artist, MD  prochlorperazine  (COMPAZINE) 10 MG tablet Take 1 tablet (10 mg total) by mouth every 6 (six) hours as needed for nausea or vomiting. 12/25/22  Yes Ladene Artist, MD  REFRESH OPTIVE ADVANCED 0.5-1-0.5 % SOLN Place 1 drop into both eyes daily as needed (itching Eyes).  11/11/19  Yes [provider]  traZODone (DESYREL) 100 MG tablet Take 1-2 tablets (100-200 mg total) by mouth at bedtime as needed for sleep. 02/14/23  Yes Melony Overly T, PA-C  venlafaxine XR (EFFEXOR-XR) 150 MG 24 hr capsule Take 1 capsule (150 mg total) by mouth daily. 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 (HCC) 01/19/2020   Chronic diarrhea 08/07/2019   Low back pain 03/01/2019   Routine general medical examination at a health care facility 08/16/2016   Carpal tunnel syndrome, right 02/08/2016   Ductal carcinoma in situ (DCIS) of left breast 10/28/2014   Contracture of palmar fascia (Dupuytren's) 07/13/2014   Hypertension    Anxiety    Asthma    Osteoarthritis    Glaucoma    Hx of adenomatous colonic polyps 11/01/2012   Breast cancer of lower-outer quadrant of right female breast (HCC) 10/12/2012    Past Medical History:  Diagnosis Date   Allergy    Anxiety    Asthma  Breast cancer (HCC) 2012   left   Breast cancer, right (HCC) 1996   right   Cataract    cataracts removed   Chronic uveitis of right eye    Colon polyps    Corneal edema of right eye    Cystoid macular edema of right eye    Depressive disorder    DVT (deep venous thrombosis) (HCC)    Family history of colon cancer    Glaucoma    not on eye drops at this time.   Hypertension    Hypertensive retinopathy of both eyes    Olecranon bursitis    Osteoarthritis    Other disorders of bone and cartilage(733.99)     Personal history of chemotherapy    1996   Personal history of radiation therapy    1996 & 2011   Pseudoexfoliation (PXF) glaucoma of both eyes    Pseudophakia, both eyes    Seasonal allergies    Unspecified pruritic disorder     Past Surgical History:  Procedure Laterality Date   APPENDECTOMY  1958   BREAST LUMPECTOMY Right 1996   Dr. Corliss Marcus in [place]   BREAST LUMPECTOMY Left 2011   Dr. Marisue Humble   cataract Bilateral    CATARACT EXTRACTION Bilateral    Dr. Delaney Meigs   COLONOSCOPY  2011-last   DUPUYTREN CONTRACTURE RELEASE     ESOPHAGOGASTRODUODENOSCOPY (EGD) WITH PROPOFOL N/A 02/10/2020   Procedure: ESOPHAGOGASTRODUODENOSCOPY (EGD) WITH PROPOFOL;  Surgeon: Rachael Fee, MD;  Location: [facility];  Service: Endoscopy;  Laterality: N/A;   FINE NEEDLE ASPIRATION N/A 02/10/2020   Procedure: FINE NEEDLE ASPIRATION (FNA) LINEAR;  Surgeon: Rachael Fee, MD;  Location: [facility];  Service: Endoscopy;  Laterality: N/A;   HAND SURGERY Right 2020   LASIK Bilateral 2007   POLYPECTOMY     ROTATOR CUFF REPAIR  2007   TONSILLECTOMY  1967   UPPER ESOPHAGEAL ENDOSCOPIC ULTRASOUND (EUS) N/A 02/10/2020   Procedure: UPPER ESOPHAGEAL ENDOSCOPIC ULTRASOUND (EUS);  Surgeon: Rachael Fee, MD;  Location: [facility];  Service: Endoscopy;  Laterality: N/A;    Social History   Socioeconomic History   Marital status: Married    Spouse name: Not on file   Number of children: 0   Years of education: Not on file   Highest education level: Bachelor's degree (e.g., BA, AB, BS)  Occupational History   Not on file  Tobacco Use   Smoking status: Never   Smokeless tobacco: Never  Vaping Use   Vaping status: Never Used  Substance and Sexual Activity   Alcohol use: Yes    Alcohol/week: 6.0 standard drinks of alcohol    Types: 6 Glasses of wine per week    Comment: daily   Drug use: No   Sexual activity: Yes  Other Topics Concern   Not on file  Social History Narrative    Married no children   Wine 3 to 4 glasses a day   Never smoker   No drug use   Social Determinants of Health   Financial Resource Strain: Low Risk  (06/30/2023)   Overall Financial Resource Strain (CARDIA)    Difficulty of Paying Living Expenses: Not very hard  Food Insecurity: No Food Insecurity (06/30/2023)   Hunger Vital Sign    Worried About Running Out of Food in the Last Year: Never true    Ran Out of Food in the Last Year: Never true  Transportation Needs: No Transportation Needs (06/30/2023)   PRAPARE -  Administrator, Civil Service (Medical): No    Lack of Transportation (Non-Medical): No  Physical Activity: Insufficiently Active (06/30/2023)   Exercise Vital Sign    Days of Exercise per Week: 3 days    Minutes of Exercise per Session: 20 min  Stress: No Stress Concern Present (06/30/2023)   [employer] of Occupational Health - Occupational Stress Questionnaire    Feeling of Stress : Only a little  Social Connections: Moderately Isolated (06/30/2023)   Social Connection and Isolation Panel [NHANES]    Frequency of Communication with Friends and Family: Once a week    Frequency of Social Gatherings with Friends and Family: Three times a week    Attends Religious Services: Never    Active Member of Clubs or Organizations: No    Attends Banker Meetings: Never    Marital Status: Married  Catering manager Violence: Not At Risk (06/12/2023)   Humiliation, Afraid, Rape, and Kick questionnaire    Fear of Current or Ex-Partner: No    Emotionally Abused: No    Physically Abused: No    Sexually Abused: No    Family History  Problem Relation Age of Onset   Stroke Father 77   Heart disease Brother    Colon cancer Maternal Aunt 35   Macular degeneration Mother    Cancer Maternal Grandmother        unknown type   Colon polyps Neg Hx    Esophageal cancer Neg Hx    Rectal cancer Neg Hx    Stomach cancer Neg Hx      Review of Systems  Constitutional:  Negative.  Negative for chills and fever.  HENT: Negative.  Negative for congestion and sore throat.   Respiratory: Negative.  Negative for cough and shortness of breath.   Cardiovascular: Negative.  Negative for chest pain and palpitations.  Gastrointestinal:  Negative for nausea and vomiting.  Genitourinary: Negative.  Negative for dysuria and hematuria.  Skin: Negative.  Negative for rash.  Neurological: Negative.  Negative for dizziness and headaches.  All other systems reviewed and are negative.   Vitals:   07/01/23 1547  BP: 122/68  Pulse: 90  Temp: 98.4 F (36.9 C)  SpO2: 95%    Physical Exam Vitals reviewed.  Constitutional:      Appearance: Normal appearance.  HENT:     Head: Normocephalic.  Eyes:     Extraocular Movements: Extraocular movements intact.  Cardiovascular:     Rate and Rhythm: Normal rate.  Pulmonary:     Effort: Pulmonary effort is normal.  Musculoskeletal:     Comments: Left ankle: No erythema or ecchymosis.  Mild swelling to lateral malleolus.  No tenderness.  Full range of motion.  Neurovascularly intact.  Good distal sensation.  Skin:    General: Skin is warm and dry.  Neurological:     Mental Status: She is alert and oriented to person, place, and time.  Psychiatric:        Mood and Affect: Mood normal.        Behavior: Behavior normal.     DG Ankle Complete Left  Result Date: 07/01/2023 CLINICAL DATA:  Left ankle pain and swelling for 10 days. EXAM: LEFT ANKLE COMPLETE - 3+ VIEW COMPARISON:  None Available. FINDINGS: There is no evidence of fracture, dislocation, or joint effusion. There is no evidence of arthropathy or other focal bone abnormality. Soft tissues are unremarkable. IMPRESSION: Negative. Electronically Signed   By: Zenda Alpers.D.  On: 07/01/2023 16:31    ASSESSMENT & PLAN: A total of 32 minutes was spent with the patient and counseling/coordination of care regarding preparing for this visit, review of most recent  office visit notes, review of chronic medical conditions under management, review of all medications, differential diagnosis of left ankle pain and swelling, review of x-ray report done today, management of ankle swelling, prognosis, documentation, and need for follow-up if no better or worse during the next several days.  Problem List Items Addressed This Visit       Other   Left ankle swelling - Primary    Pain much improved but still persistent mild swelling. Unremarkable x-ray. No signs of gout or reactive arthritis. Most likely mild sprain.  Healing well.  No complications. No concerns at this time.      Relevant Orders   DG Ankle Complete Left (Completed)   Patient Instructions  Ankle Pain The ankle joint helps you stand on your leg and allows you to move around. Ankle pain can happen on either side or the back of the ankle. You may have pain in one ankle or both ankles. Ankle pain may be sharp and burning or dull and aching. There may be tenderness, stiffness, redness, or warmth around the ankle. Many things can cause ankle pain. These include an injury to the area and overuse of your ankle. Follow these instructions at home: Activity Rest your ankle as told by your health care provider. Avoid doing things that cause ankle pain. Do not use the injured limb to support your body weight until your provider says that you can. Use crutches as told by your provider. Ask your provider when it is safe to drive if you have a brace on your ankle. Do exercises as told by your provider. If you have a removable brace: Wear the brace as told by your provider. Remove it only as told by your provider. Check the skin around the brace every day. Tell your provider about any concerns. Loosen the brace if your toes tingle, become numb, or turn cold and blue. Keep the brace clean. If the brace is not waterproof: Do not let it get wet. Cover it with a watertight covering when you take a bath or  shower. If you have an elastic bandage:  Remove it when you take a bath or a shower. Try not to move your ankle much. Wiggle your toes from time to time. This helps to prevent swelling. Adjust the bandage if it feels too tight. Loosen the bandage if your foot tingles, becomes numb, or turns cold and blue. Managing pain, stiffness, and swelling  If told, put ice on the painful area. If you have a removable brace or elastic bandage, remove it as told by your provider. Put ice in a plastic bag. Place a towel between your skin and the bag. Leave the ice on for 20 minutes, 2-3 times a day. If your skin turns bright red, remove the ice right away to prevent skin damage. The risk of damage is higher if you cannot feel pain, heat, or cold. Move your toes often to reduce stiffness and swelling. Raise (elevate) your ankle above the level of your heart while you are sitting or lying down. General instructions Take over-the-counter and prescription medicines only as told by your provider. To help you and your provider, write down: How often you have ankle pain. Where the pain is. What the pain feels like. If you are told to wear a  certain shoe or insole, make sure you wear it the right way and for as long as you are told. Contact a health care provider if: Your pain gets worse. Your pain does not get better with medicine. You have a fever or chills. You have more trouble walking. You have new symptoms. Your foot, leg, toes, or ankle tingles, becomes numb or swollen, or turns cold and blue. This information is not intended to replace advice given to you by your health care provider. Make sure you discuss any questions you have with your health care provider. Document Revised: 09/04/2022 Document Reviewed: 09/04/2022 Elsevier Patient Education  2024 Elsevier Inc.     Edwina Barth, MD [facility]

## 2023-07-14 ENCOUNTER — Ambulatory Visit: Payer: PPO | Admitting: Oncology

## 2023-07-14 ENCOUNTER — Other Ambulatory Visit: Payer: PPO

## 2023-07-14 DIAGNOSIS — Z01818 Encounter for other preprocedural examination: Secondary | ICD-10-CM | POA: Diagnosis not present

## 2023-07-14 DIAGNOSIS — I10 Essential (primary) hypertension: Secondary | ICD-10-CM | POA: Diagnosis not present

## 2023-07-14 DIAGNOSIS — Z853 Personal history of malignant neoplasm of breast: Secondary | ICD-10-CM | POA: Diagnosis not present

## 2023-07-14 DIAGNOSIS — J4520 Mild intermittent asthma, uncomplicated: Secondary | ICD-10-CM | POA: Diagnosis not present

## 2023-07-14 DIAGNOSIS — J90 Pleural effusion, not elsewhere classified: Secondary | ICD-10-CM | POA: Diagnosis not present

## 2023-07-14 DIAGNOSIS — Z923 Personal history of irradiation: Secondary | ICD-10-CM | POA: Diagnosis not present

## 2023-07-14 DIAGNOSIS — Z9221 Personal history of antineoplastic chemotherapy: Secondary | ICD-10-CM | POA: Diagnosis not present

## 2023-07-14 DIAGNOSIS — R918 Other nonspecific abnormal finding of lung field: Secondary | ICD-10-CM | POA: Diagnosis not present

## 2023-07-14 DIAGNOSIS — I2693 Single subsegmental pulmonary embolism without acute cor pulmonale: Secondary | ICD-10-CM | POA: Diagnosis not present

## 2023-07-14 DIAGNOSIS — C159 Malignant neoplasm of esophagus, unspecified: Secondary | ICD-10-CM | POA: Diagnosis not present

## 2023-07-14 DIAGNOSIS — C158 Malignant neoplasm of overlapping sites of esophagus: Secondary | ICD-10-CM | POA: Diagnosis not present

## 2023-07-14 DIAGNOSIS — I3139 Other pericardial effusion (noninflammatory): Secondary | ICD-10-CM | POA: Diagnosis not present

## 2023-07-14 DIAGNOSIS — Z86711 Personal history of pulmonary embolism: Secondary | ICD-10-CM | POA: Diagnosis not present

## 2023-07-15 DIAGNOSIS — Z91040 Latex allergy status: Secondary | ICD-10-CM | POA: Diagnosis not present

## 2023-07-15 DIAGNOSIS — Z09 Encounter for follow-up examination after completed treatment for conditions other than malignant neoplasm: Secondary | ICD-10-CM | POA: Diagnosis not present

## 2023-07-15 DIAGNOSIS — J45909 Unspecified asthma, uncomplicated: Secondary | ICD-10-CM | POA: Diagnosis not present

## 2023-07-15 DIAGNOSIS — Z923 Personal history of irradiation: Secondary | ICD-10-CM | POA: Diagnosis not present

## 2023-07-15 DIAGNOSIS — Z79899 Other long term (current) drug therapy: Secondary | ICD-10-CM | POA: Diagnosis not present

## 2023-07-15 DIAGNOSIS — C154 Malignant neoplasm of middle third of esophagus: Secondary | ICD-10-CM | POA: Diagnosis not present

## 2023-07-15 DIAGNOSIS — K222 Esophageal obstruction: Secondary | ICD-10-CM | POA: Diagnosis not present

## 2023-07-15 DIAGNOSIS — J4520 Mild intermittent asthma, uncomplicated: Secondary | ICD-10-CM | POA: Diagnosis not present

## 2023-07-15 DIAGNOSIS — Z8501 Personal history of malignant neoplasm of esophagus: Secondary | ICD-10-CM | POA: Diagnosis not present

## 2023-07-15 DIAGNOSIS — I10 Essential (primary) hypertension: Secondary | ICD-10-CM | POA: Diagnosis not present

## 2023-07-15 DIAGNOSIS — Z853 Personal history of malignant neoplasm of breast: Secondary | ICD-10-CM | POA: Diagnosis not present

## 2023-07-15 DIAGNOSIS — Z08 Encounter for follow-up examination after completed treatment for malignant neoplasm: Secondary | ICD-10-CM | POA: Diagnosis not present

## 2023-07-15 DIAGNOSIS — Z9221 Personal history of antineoplastic chemotherapy: Secondary | ICD-10-CM | POA: Diagnosis not present

## 2023-07-15 DIAGNOSIS — Z86711 Personal history of pulmonary embolism: Secondary | ICD-10-CM | POA: Diagnosis not present

## 2023-07-16 ENCOUNTER — Ambulatory Visit: Payer: PPO | Admitting: Physician Assistant

## 2023-07-22 DIAGNOSIS — H40041 Steroid responder, right eye: Secondary | ICD-10-CM | POA: Diagnosis not present

## 2023-07-22 DIAGNOSIS — H18231 Secondary corneal edema, right eye: Secondary | ICD-10-CM | POA: Diagnosis not present

## 2023-07-29 ENCOUNTER — Inpatient Hospital Stay (HOSPITAL_BASED_OUTPATIENT_CLINIC_OR_DEPARTMENT_OTHER): Payer: PPO | Admitting: Oncology

## 2023-07-29 ENCOUNTER — Inpatient Hospital Stay: Payer: PPO | Attending: Oncology

## 2023-07-29 VITALS — BP 148/87 | HR 100 | Temp 98.2°F | Resp 18 | Ht 63.0 in | Wt 111.5 lb

## 2023-07-29 DIAGNOSIS — Z853 Personal history of malignant neoplasm of breast: Secondary | ICD-10-CM | POA: Insufficient documentation

## 2023-07-29 DIAGNOSIS — H1820 Unspecified corneal edema: Secondary | ICD-10-CM | POA: Insufficient documentation

## 2023-07-29 DIAGNOSIS — J45909 Unspecified asthma, uncomplicated: Secondary | ICD-10-CM | POA: Insufficient documentation

## 2023-07-29 DIAGNOSIS — Z923 Personal history of irradiation: Secondary | ICD-10-CM | POA: Insufficient documentation

## 2023-07-29 DIAGNOSIS — Z8501 Personal history of malignant neoplasm of esophagus: Secondary | ICD-10-CM | POA: Insufficient documentation

## 2023-07-29 DIAGNOSIS — F32A Depression, unspecified: Secondary | ICD-10-CM | POA: Diagnosis not present

## 2023-07-29 DIAGNOSIS — H209 Unspecified iridocyclitis: Secondary | ICD-10-CM | POA: Diagnosis not present

## 2023-07-29 DIAGNOSIS — Z9221 Personal history of antineoplastic chemotherapy: Secondary | ICD-10-CM | POA: Diagnosis not present

## 2023-07-29 DIAGNOSIS — I10 Essential (primary) hypertension: Secondary | ICD-10-CM | POA: Insufficient documentation

## 2023-07-29 DIAGNOSIS — Z86711 Personal history of pulmonary embolism: Secondary | ICD-10-CM | POA: Diagnosis not present

## 2023-07-29 LAB — CBC WITH DIFFERENTIAL (CANCER CENTER ONLY)
Abs Immature Granulocytes: 0.08 10*3/uL — ABNORMAL HIGH (ref 0.00–0.07)
Basophils Absolute: 0.1 10*3/uL (ref 0.0–0.1)
Basophils Relative: 1 %
Eosinophils Absolute: 0.2 10*3/uL (ref 0.0–0.5)
Eosinophils Relative: 2 %
HCT: 40.9 % (ref 36.0–46.0)
Hemoglobin: 13.7 g/dL (ref 12.0–15.0)
Immature Granulocytes: 1 %
Lymphocytes Relative: 12 %
Lymphs Abs: 1.2 10*3/uL (ref 0.7–4.0)
MCH: 32.9 pg (ref 26.0–34.0)
MCHC: 33.5 g/dL (ref 30.0–36.0)
MCV: 98.1 fL (ref 80.0–100.0)
Monocytes Absolute: 0.9 10*3/uL (ref 0.1–1.0)
Monocytes Relative: 9 %
Neutro Abs: 7.9 10*3/uL — ABNORMAL HIGH (ref 1.7–7.7)
Neutrophils Relative %: 75 %
Platelet Count: 284 10*3/uL (ref 150–400)
RBC: 4.17 MIL/uL (ref 3.87–5.11)
RDW: 13.9 % (ref 11.5–15.5)
WBC Count: 10.3 10*3/uL (ref 4.0–10.5)
nRBC: 0 % (ref 0.0–0.2)

## 2023-07-29 LAB — CMP (CANCER CENTER ONLY)
ALT: 9 U/L (ref 0–44)
AST: 16 U/L (ref 15–41)
Albumin: 4.1 g/dL (ref 3.5–5.0)
Alkaline Phosphatase: 58 U/L (ref 38–126)
Anion gap: 7 (ref 5–15)
BUN: 20 mg/dL (ref 8–23)
CO2: 33 mmol/L — ABNORMAL HIGH (ref 22–32)
Calcium: 9.4 mg/dL (ref 8.9–10.3)
Chloride: 102 mmol/L (ref 98–111)
Creatinine: 1.15 mg/dL — ABNORMAL HIGH (ref 0.44–1.00)
GFR, Estimated: 49 mL/min — ABNORMAL LOW (ref >60)
Glucose, Bld: 114 mg/dL — ABNORMAL HIGH (ref 70–99)
Potassium: 4.7 mmol/L (ref 3.5–5.1)
Sodium: 142 mmol/L (ref 135–145)
Total Bilirubin: 0.4 mg/dL (ref 0.3–1.2)
Total Protein: 7.1 g/dL (ref 6.5–8.1)

## 2023-07-29 NOTE — Progress Notes (Signed)
[facility] OFFICE PROGRESS NOTE   Diagnosis: Esophagus cancer  INTERVAL HISTORY:   Jo Morrison returns as scheduled.  She feels well.  Good appetite.  She is exercising.  No dysphagia.  She has a chronic cough.  Objective:  Vital signs in last 24 hours:  Blood pressure (!) 148/87, pulse 100, temperature 98.2 F (36.8 C), temperature source Oral, resp. rate 18, height 5\' 3"  (1.6 m), weight 111 lb 8 oz (50.6 kg), SpO2 100%.    HEENT: Neck without mass Lymphatics: No cervical, supraclavicular, axillary, or inguinal nodes Resp: Distant breath sounds, no respiratory distress Cardio: Regular rate and rhythm GI: No hepatosplenomegaly Vascular: No leg edema   Lab Results:  Lab Results  Component Value Date   WBC 10.3 07/29/2023   HGB 13.7 07/29/2023   HCT 40.9 07/29/2023   MCV 98.1 07/29/2023   PLT 284 07/29/2023   NEUTROABS 7.9 (H) 07/29/2023    CMP  Lab Results  Component Value Date   NA 142 07/29/2023   K 4.7 07/29/2023   CL 102 07/29/2023   CO2 33 (H) 07/29/2023   GLUCOSE 114 (H) 07/29/2023   BUN 20 07/29/2023   CREATININE 1.15 (H) 07/29/2023   CALCIUM 9.4 07/29/2023   PROT 7.1 07/29/2023   ALBUMIN 4.1 07/29/2023   AST 16 07/29/2023   ALT 9 07/29/2023   ALKPHOS 58 07/29/2023   BILITOT 0.4 07/29/2023   GFRNONAA 49 (L) 07/29/2023   GFRAA >60 02/21/2020    No results found for: "CEA1", "CEA", "WUJ811", "CA125"  No results found for: "INR", "LABPROT"  Imaging:  No results found.  Medications: I have reviewed the patient's current medications.   Assessment/Plan: Squamous cell carcinoma of the upper/middle third of the esophagus Upper endoscopy 01/14/2020-ulcerated mass in the proximal/mid esophagus at 26-31 cm from the incisors, biopsy confirmed invasive moderately differentiated squamous cell carcinoma, PD-L1 combined positive score 0% CT abdomen/pelvis 12/09/2019-uterine fibroids CT chest 12/30/2019-normal esophagus, new sclerosis of the  medial head of the right clavicle likely degenerative PET scan 01/24/2020-long segment of hypermetabolic thickening of the distal esophagus.  For hypermetabolic metastatic lymph nodes, 3 in the mediastinum and 1 in the upper abdomen gastrohepatic ligament.  EUS 02/10/2020-ulcerated mass in the mid esophagus,uT3, 3 suspicious lymph nodes near the primary tumor, malignant appearing lymph node at the gastrohepatic ligament (level 18)-metastatic squamous cell carcinoma Radiation 01/31/2020 Cycle 1 weekly Taxol/carboplatin 02/01/2020 Cycle 2 weekly Taxol/carboplatin 02/08/2020 Cycle 3 weekly Taxol/carboplatin 02/15/2020 Cycle 4 weekly Taxol/carboplatin 02/22/2020 Cycle 5 weekly Taxol/carboplatin 02/29/2020 Radiation completed 03/08/2020 CTs 04/03/2020-esophageal mass appears grossly smaller, regression of previously noted lymphadenopathy in the mediastinum and upper abdomen, nonocclusive pulmonary embolus right-sided pulmonary arteries Upper endoscopy 05/02/2020-treatment effect visible starting approximately 26 cm from the incisors.  Scope passed easily into the stomach and proximal duodenum.  Stomach and proximal duodenum grossly normal.  Scope withdrawn to the mid esophagus with four-quadrant biopsies obtained.  2 biopsy showed acute ulcerative esophagitis; 2 biopsies showed squamous mucosa with minimal histologic abnormality. CTs 07/24/2020-no evidence of metastatic disease, left gastric lymph node has decreased in size, mild wall thickening of the mid to distal esophagus Upper endoscopy 07/25/2020-multiple biopsies with changes of ulcerative esophagitis, negative fungal, HSV, and CMV stains, no evidence of malignancy CTs at [facility] 11/27/2020-interval decrease in prior mediastinal groundglass opacities; decrease in now mild diffuse esophageal wall thickening, most prominent in the mid and distal esophagus; few scattered 3 mm and smaller noncalcified pulmonary nodules not significantly changed.  No definite new or  enlarging  pulmonary nodule.  No evidence of metastatic disease in the abdomen. EGD 11/28/2020-mild and discrete stricture in the distal esophagus; no mass lesions seen.  Biopsies taken, negative for malignancy x4; serial dilatation performed. CTs at [facility] 08/06/2021-no evidence of recurrent disease EGD 08/07/2021-no mass, mild stricture, multiple biopsies negative for carcinoma EGD at [facility] 12/25/2021-mild stricture, no mass, biopsies negative for carcinoma, 1 biopsy with possible low-grade dysplasia CTs 12/24/2021-negative for recurrent disease CTs at [facility] 07/01/2022-no evidence of recurrent disease Endoscopy 07/02/2022-negative for malignancy Progressive dysphagia/odynophagia status post EGD 12/17/2022-near complete obstruction UES, biopsy-moderately differentiated squamous cell carcinoma; PD-L1 CPS 45  PET scan at [facility] 12/18/2022-FDG avidity in the cervical esophagus.  No FDG avid metastatic disease. Radiation 01/02/2023-02/10/2023 Cycle 1 weekly Taxol/carboplatin 01/02/2023 Cycle 2 weekly Taxol/carboplatin 01/09/2023 Cycle 3 weekly Taxol/carboplatin 01/16/2023 Cycle 4 weekly Taxol/carboplatin 01/23/2023 Cycle 5 weekly Taxol/carboplatin 01/30/2023 CT neck 03/31/2023-resolution of upper esophagus mass, no evidence of metastatic disease CT chest 03/31/2023-similar wall thickening of the mid to distal esophagus, stable small bilateral pulmonary nodules, able small pericardial effusion, new small nonspecific bilateral pleural effusions Upper endoscopy 04/01/2023-multiple esophagus biopsies-esophagitis at 28 cm and 30 cm CTs 07/14/2023-no esophagus mass, no lymphadenopathy, mild diffuse esophageal wall thickening, unchanged bilateral pulmonary nodules, unchanged small bilateral pleural effusions and pericardial effusion Upper endoscopy 07/15/2023-stricture at 14 cm, remainder of the esophagus and stomach appear normal, biopsies from 14, 16, 28, 30, and 32 cm-negative for malignancy, proximal esophagus dilated  Right breast cancer July  1996, [name], [name], right lumpectomy and axillary dissection followed by adjuvant Adriamycin/Taxol chemotherapy-4 cycles, 5 years of tamoxifen, and breast radiation   3.   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  Depression   5.  Hypertension   6.  Right eye uveitis, macular edema, and corneal edema   7.  Asthma   8.  Pulmonary embolus on CT 04/03/2020-lovenox initiated, anticoagulation changed to apixaban beginning 08/03/2020, anticoagulation completed approximately the beginning of December 2021   9.  Odynophagia secondary to radiation -resolved      Disposition: Jo Morrison is in clinical remission from esophagus cancer.  The restaging evaluation at [facility] last month revealed no evidence of residual/progressive disease.  She is scheduled for follow-up with Dr. Glenna Durand in November.  She will return for an office visit here a few weeks after the November restaging evaluation.  Thornton Papas, MD  07/29/2023  9:42 AM

## 2023-07-31 ENCOUNTER — Ambulatory Visit: Payer: PPO

## 2023-08-01 ENCOUNTER — Ambulatory Visit (INDEPENDENT_AMBULATORY_CARE_PROVIDER_SITE_OTHER): Payer: PPO

## 2023-08-01 DIAGNOSIS — Z23 Encounter for immunization: Secondary | ICD-10-CM

## 2023-08-25 ENCOUNTER — Encounter: Payer: Self-pay | Admitting: Internal Medicine

## 2023-08-25 ENCOUNTER — Ambulatory Visit (INDEPENDENT_AMBULATORY_CARE_PROVIDER_SITE_OTHER): Payer: PPO | Admitting: Internal Medicine

## 2023-08-25 VITALS — BP 122/82 | HR 98 | Temp 98.5°F | Ht 63.0 in | Wt 111.0 lb

## 2023-08-25 DIAGNOSIS — C158 Malignant neoplasm of overlapping sites of esophagus: Secondary | ICD-10-CM

## 2023-08-25 DIAGNOSIS — Z86010 Personal history of colonic polyps: Secondary | ICD-10-CM

## 2023-08-25 DIAGNOSIS — I10 Essential (primary) hypertension: Secondary | ICD-10-CM

## 2023-08-25 DIAGNOSIS — Z860101 Personal history of adenomatous and serrated colon polyps: Secondary | ICD-10-CM

## 2023-08-25 DIAGNOSIS — Z0000 Encounter for general adult medical examination without abnormal findings: Secondary | ICD-10-CM

## 2023-08-25 MED ORDER — HYDROCHLOROTHIAZIDE 25 MG PO TABS: 25.0000 mg | ORAL_TABLET | Freq: Every day | ORAL | 3 refills | Status: DC

## 2023-08-25 MED ORDER — MONTELUKAST SODIUM 10 MG PO TABS: 10.0000 mg | ORAL_TABLET | Freq: Every day | ORAL | 3 refills | Status: DC

## 2023-08-25 MED ORDER — FLUTICASONE FUROATE-VILANTEROL 200-25 MCG/ACT IN AEPB: 1.0000 | INHALATION_SPRAY | Freq: Every day | RESPIRATORY_TRACT | 3 refills | Status: DC

## 2023-08-25 NOTE — Assessment & Plan Note (Signed)
Continues to undergo surveillance next due Nov 2024.

## 2023-08-25 NOTE — Progress Notes (Signed)
Subjective:   Patient ID: Jo Morrison, female    DOB: 20-Dec-1945, 77 y.o.   MRN: [id number]  HPI The patient is here for physical.  PMH, [facility], social history reviewed and updated  Review of Systems  Constitutional: Negative.   HENT: Negative.    Eyes: Negative.   Respiratory:  Negative for cough, chest tightness and shortness of breath.   Cardiovascular:  Negative for chest pain, palpitations and leg swelling.  Gastrointestinal:  Negative for abdominal distention, abdominal pain, constipation, diarrhea, nausea and vomiting.  Musculoskeletal: Negative.   Skin: Negative.   Neurological: Negative.   Psychiatric/Behavioral: Negative.      Objective:  Physical Exam Constitutional:      Appearance: She is well-developed.  HENT:     Head: Normocephalic and atraumatic.  Cardiovascular:     Rate and Rhythm: Normal rate and regular rhythm.  Pulmonary:     Effort: Pulmonary effort is normal. No respiratory distress.     Breath sounds: Normal breath sounds. No wheezing or rales.  Abdominal:     General: Bowel sounds are normal. There is no distension.     Palpations: Abdomen is soft.     Tenderness: There is no abdominal tenderness. There is no rebound.  Musculoskeletal:     Cervical back: Normal range of motion.  Skin:    General: Skin is warm and dry.  Neurological:     Mental Status: She is alert and oriented to person, place, and time.     Coordination: Coordination normal.     Vitals:   08/25/23 1005 08/25/23 1007 08/25/23 1027  BP: (!) 122/94 (!) 122/94 122/82  Pulse: 98    Temp: 98.5 F (36.9 C)    TempSrc: Oral    SpO2: 95%    Weight: 111 lb (50.3 kg)    Height: 5\' 3"  (1.6 m)      Assessment & Plan:

## 2023-08-25 NOTE — Assessment & Plan Note (Signed)
Flu shot complete. Pneumonia complete. Shingrix complete. Tetanus up to date. Colonoscopy possibly due. Mammogram up to date, pap smear aged out and dexa complete. Counseled about sun safety and mole surveillance. Counseled about the dangers of distracted driving. Given 10 year screening recommendations.

## 2023-08-25 NOTE — Assessment & Plan Note (Signed)
Will follow up with GI to see if there is more benefit or risk to recall screening.

## 2023-08-25 NOTE — Assessment & Plan Note (Signed)
BP at goal on hydrochlorothiazide 25 mg daily and labs recently normal. Will continue.

## 2023-09-04 ENCOUNTER — Ambulatory Visit: Payer: PPO | Admitting: Physician Assistant

## 2023-09-09 DIAGNOSIS — H0234 Blepharochalasis left upper eyelid: Secondary | ICD-10-CM | POA: Diagnosis not present

## 2023-09-09 DIAGNOSIS — H0279 Other degenerative disorders of eyelid and periocular area: Secondary | ICD-10-CM | POA: Diagnosis not present

## 2023-09-09 DIAGNOSIS — D210 Benign neoplasm of connective and other soft tissue of head, face and neck: Secondary | ICD-10-CM | POA: Diagnosis not present

## 2023-09-09 DIAGNOSIS — D485 Neoplasm of uncertain behavior of skin: Secondary | ICD-10-CM | POA: Diagnosis not present

## 2023-09-09 DIAGNOSIS — Z01818 Encounter for other preprocedural examination: Secondary | ICD-10-CM | POA: Diagnosis not present

## 2023-09-09 DIAGNOSIS — H0231 Blepharochalasis right upper eyelid: Secondary | ICD-10-CM | POA: Diagnosis not present

## 2023-10-09 ENCOUNTER — Encounter: Payer: Self-pay | Admitting: Internal Medicine

## 2023-10-09 ENCOUNTER — Ambulatory Visit: Payer: PPO | Admitting: Internal Medicine

## 2023-10-09 VITALS — BP 126/82 | HR 90 | Temp 98.0°F | Ht 63.0 in | Wt 113.0 lb

## 2023-10-09 DIAGNOSIS — J189 Pneumonia, unspecified organism: Secondary | ICD-10-CM | POA: Diagnosis not present

## 2023-10-09 DIAGNOSIS — R052 Subacute cough: Secondary | ICD-10-CM | POA: Diagnosis not present

## 2023-10-09 MED ORDER — DOXYCYCLINE HYCLATE 100 MG PO TABS: 100.0000 mg | ORAL_TABLET | Freq: Two times a day (BID) | ORAL | 0 refills | Status: DC

## 2023-10-09 NOTE — Assessment & Plan Note (Signed)
CAP suspected with 2-3 weeks worsening cough which is productive and rhonchi on exam. Rx doxycycline 1 week and if no improvement in 2-3 days ordered CXR she will get this done.

## 2023-10-09 NOTE — Progress Notes (Signed)
   Subjective:   Patient ID: Jo Morrison, female    DOB: 1946/05/31, 77 y.o.   MRN: [id number]  Cough Pertinent negatives include no chest pain or shortness of breath.   The patient is a 77 YO female coming in for new cough several weeks ago. Worse at night time. Taking otc without relief. No SOB. No fevers or chills. Cough is productive.   Review of Systems  Constitutional: Negative.   HENT: Negative.    Eyes: Negative.   Respiratory:  Positive for cough. Negative for chest tightness and shortness of breath.   Cardiovascular:  Negative for chest pain, palpitations and leg swelling.  Gastrointestinal:  Negative for abdominal distention, abdominal pain, constipation, diarrhea, nausea and vomiting.  Musculoskeletal: Negative.   Skin: Negative.   Neurological: Negative.   Psychiatric/Behavioral: Negative.      Objective:  Physical Exam Constitutional:      Appearance: She is well-developed.  HENT:     Head: Normocephalic and atraumatic.  Cardiovascular:     Rate and Rhythm: Normal rate and regular rhythm.  Pulmonary:     Effort: Pulmonary effort is normal. No respiratory distress.     Breath sounds: Rhonchi present. No wheezing or rales.     Comments: Rhonchi right lung Abdominal:     General: Bowel sounds are normal. There is no distension.     Palpations: Abdomen is soft.     Tenderness: There is no abdominal tenderness. There is no rebound.  Musculoskeletal:     Cervical back: Normal range of motion.  Skin:    General: Skin is warm and dry.  Neurological:     Mental Status: She is alert and oriented to person, place, and time.     Coordination: Coordination normal.     Vitals:   10/09/23 0824  BP: 126/82  Pulse: 90  Temp: 98 F (36.7 C)  TempSrc: Oral  SpO2: 98%  Weight: 113 lb (51.3 kg)  Height: 5\' 3"  (1.6 m)    Assessment & Plan:

## 2023-10-09 NOTE — Patient Instructions (Signed)
We have sent in doxycycline to take 1 pill twice a day for 1 week.  If not improvement in 2-3 days come for the x-ray.

## 2023-10-13 ENCOUNTER — Ambulatory Visit (INDEPENDENT_AMBULATORY_CARE_PROVIDER_SITE_OTHER): Payer: PPO

## 2023-10-13 DIAGNOSIS — I771 Stricture of artery: Secondary | ICD-10-CM | POA: Diagnosis not present

## 2023-10-13 DIAGNOSIS — R052 Subacute cough: Secondary | ICD-10-CM | POA: Diagnosis not present

## 2023-10-13 DIAGNOSIS — R059 Cough, unspecified: Secondary | ICD-10-CM | POA: Diagnosis not present

## 2023-10-14 DIAGNOSIS — H53483 Generalized contraction of visual field, bilateral: Secondary | ICD-10-CM | POA: Diagnosis not present

## 2023-10-14 DIAGNOSIS — H57813 Brow ptosis, bilateral: Secondary | ICD-10-CM | POA: Diagnosis not present

## 2023-10-14 DIAGNOSIS — H02421 Myogenic ptosis of right eyelid: Secondary | ICD-10-CM | POA: Diagnosis not present

## 2023-10-14 DIAGNOSIS — H02413 Mechanical ptosis of bilateral eyelids: Secondary | ICD-10-CM | POA: Diagnosis not present

## 2023-10-14 DIAGNOSIS — H0234 Blepharochalasis left upper eyelid: Secondary | ICD-10-CM | POA: Diagnosis not present

## 2023-10-14 DIAGNOSIS — H02412 Mechanical ptosis of left eyelid: Secondary | ICD-10-CM | POA: Diagnosis not present

## 2023-10-14 DIAGNOSIS — D485 Neoplasm of uncertain behavior of skin: Secondary | ICD-10-CM | POA: Diagnosis not present

## 2023-10-14 DIAGNOSIS — H02411 Mechanical ptosis of right eyelid: Secondary | ICD-10-CM | POA: Diagnosis not present

## 2023-10-14 DIAGNOSIS — H0279 Other degenerative disorders of eyelid and periocular area: Secondary | ICD-10-CM | POA: Diagnosis not present

## 2023-10-14 DIAGNOSIS — H0231 Blepharochalasis right upper eyelid: Secondary | ICD-10-CM | POA: Diagnosis not present

## 2023-10-14 DIAGNOSIS — Z01818 Encounter for other preprocedural examination: Secondary | ICD-10-CM | POA: Diagnosis not present

## 2023-10-14 DIAGNOSIS — Z09 Encounter for follow-up examination after completed treatment for conditions other than malignant neoplasm: Secondary | ICD-10-CM | POA: Diagnosis not present

## 2023-10-15 ENCOUNTER — Encounter: Payer: Self-pay | Admitting: Oncology

## 2023-10-15 NOTE — Telephone Encounter (Signed)
Telephone call  

## 2023-10-17 ENCOUNTER — Encounter: Payer: Self-pay | Admitting: Oncology

## 2023-11-03 DIAGNOSIS — R918 Other nonspecific abnormal finding of lung field: Secondary | ICD-10-CM | POA: Diagnosis not present

## 2023-11-03 DIAGNOSIS — H26491 Other secondary cataract, right eye: Secondary | ICD-10-CM | POA: Diagnosis not present

## 2023-11-03 DIAGNOSIS — I10 Essential (primary) hypertension: Secondary | ICD-10-CM | POA: Diagnosis not present

## 2023-11-03 DIAGNOSIS — Z86711 Personal history of pulmonary embolism: Secondary | ICD-10-CM | POA: Diagnosis not present

## 2023-11-03 DIAGNOSIS — C158 Malignant neoplasm of overlapping sites of esophagus: Secondary | ICD-10-CM | POA: Diagnosis not present

## 2023-11-03 DIAGNOSIS — I119 Hypertensive heart disease without heart failure: Secondary | ICD-10-CM | POA: Diagnosis not present

## 2023-11-03 DIAGNOSIS — C159 Malignant neoplasm of esophagus, unspecified: Secondary | ICD-10-CM | POA: Diagnosis not present

## 2023-11-03 DIAGNOSIS — M1990 Unspecified osteoarthritis, unspecified site: Secondary | ICD-10-CM | POA: Diagnosis not present

## 2023-11-03 DIAGNOSIS — I3139 Other pericardial effusion (noninflammatory): Secondary | ICD-10-CM | POA: Diagnosis not present

## 2023-11-03 DIAGNOSIS — H00011 Hordeolum externum right upper eyelid: Secondary | ICD-10-CM | POA: Diagnosis not present

## 2023-11-03 DIAGNOSIS — Z947 Corneal transplant status: Secondary | ICD-10-CM | POA: Diagnosis not present

## 2023-11-03 DIAGNOSIS — C50511 Malignant neoplasm of lower-outer quadrant of right female breast: Secondary | ICD-10-CM | POA: Diagnosis not present

## 2023-11-03 DIAGNOSIS — H401431 Capsular glaucoma with pseudoexfoliation of lens, bilateral, mild stage: Secondary | ICD-10-CM | POA: Diagnosis not present

## 2023-11-03 DIAGNOSIS — Z01818 Encounter for other preprocedural examination: Secondary | ICD-10-CM | POA: Diagnosis not present

## 2023-11-03 DIAGNOSIS — Z961 Presence of intraocular lens: Secondary | ICD-10-CM | POA: Diagnosis not present

## 2023-11-03 DIAGNOSIS — R053 Chronic cough: Secondary | ICD-10-CM | POA: Diagnosis not present

## 2023-11-03 DIAGNOSIS — J90 Pleural effusion, not elsewhere classified: Secondary | ICD-10-CM | POA: Diagnosis not present

## 2023-11-03 DIAGNOSIS — H409 Unspecified glaucoma: Secondary | ICD-10-CM | POA: Diagnosis not present

## 2023-11-03 DIAGNOSIS — F419 Anxiety disorder, unspecified: Secondary | ICD-10-CM | POA: Diagnosis not present

## 2023-11-03 DIAGNOSIS — R1310 Dysphagia, unspecified: Secondary | ICD-10-CM | POA: Diagnosis not present

## 2023-11-03 DIAGNOSIS — C50912 Malignant neoplasm of unspecified site of left female breast: Secondary | ICD-10-CM | POA: Diagnosis not present

## 2023-11-03 DIAGNOSIS — Z8501 Personal history of malignant neoplasm of esophagus: Secondary | ICD-10-CM | POA: Diagnosis not present

## 2023-11-03 DIAGNOSIS — H2011 Chronic iridocyclitis, right eye: Secondary | ICD-10-CM | POA: Diagnosis not present

## 2023-11-03 DIAGNOSIS — H18231 Secondary corneal edema, right eye: Secondary | ICD-10-CM | POA: Diagnosis not present

## 2023-11-03 DIAGNOSIS — R000 Tachycardia, unspecified: Secondary | ICD-10-CM | POA: Diagnosis not present

## 2023-11-03 DIAGNOSIS — H35351 Cystoid macular degeneration, right eye: Secondary | ICD-10-CM | POA: Diagnosis not present

## 2023-11-03 DIAGNOSIS — R9431 Abnormal electrocardiogram [ECG] [EKG]: Secondary | ICD-10-CM | POA: Diagnosis not present

## 2023-11-03 DIAGNOSIS — J189 Pneumonia, unspecified organism: Secondary | ICD-10-CM | POA: Diagnosis not present

## 2023-11-03 DIAGNOSIS — H40051 Ocular hypertension, right eye: Secondary | ICD-10-CM | POA: Diagnosis not present

## 2023-11-04 DIAGNOSIS — Z9221 Personal history of antineoplastic chemotherapy: Secondary | ICD-10-CM | POA: Diagnosis not present

## 2023-11-04 DIAGNOSIS — Z8501 Personal history of malignant neoplasm of esophagus: Secondary | ICD-10-CM | POA: Diagnosis not present

## 2023-11-04 DIAGNOSIS — Z923 Personal history of irradiation: Secondary | ICD-10-CM | POA: Diagnosis not present

## 2023-11-04 DIAGNOSIS — Z7901 Long term (current) use of anticoagulants: Secondary | ICD-10-CM | POA: Diagnosis not present

## 2023-11-04 DIAGNOSIS — K2270 Barrett's esophagus without dysplasia: Secondary | ICD-10-CM | POA: Diagnosis not present

## 2023-11-04 DIAGNOSIS — K2289 Other specified disease of esophagus: Secondary | ICD-10-CM | POA: Diagnosis not present

## 2023-11-04 DIAGNOSIS — Z9049 Acquired absence of other specified parts of digestive tract: Secondary | ICD-10-CM | POA: Diagnosis not present

## 2023-11-04 DIAGNOSIS — I10 Essential (primary) hypertension: Secondary | ICD-10-CM | POA: Diagnosis not present

## 2023-11-04 DIAGNOSIS — Z91040 Latex allergy status: Secondary | ICD-10-CM | POA: Diagnosis not present

## 2023-11-04 DIAGNOSIS — J45909 Unspecified asthma, uncomplicated: Secondary | ICD-10-CM | POA: Diagnosis not present

## 2023-11-04 DIAGNOSIS — Z79899 Other long term (current) drug therapy: Secondary | ICD-10-CM | POA: Diagnosis not present

## 2023-11-04 DIAGNOSIS — Z08 Encounter for follow-up examination after completed treatment for malignant neoplasm: Secondary | ICD-10-CM | POA: Diagnosis not present

## 2023-11-04 DIAGNOSIS — Z86711 Personal history of pulmonary embolism: Secondary | ICD-10-CM | POA: Diagnosis not present

## 2023-11-06 ENCOUNTER — Inpatient Hospital Stay: Payer: PPO | Admitting: Oncology

## 2023-11-18 ENCOUNTER — Inpatient Hospital Stay: Payer: PPO | Attending: Oncology | Admitting: Oncology

## 2023-11-18 VITALS — BP 137/79 | HR 100 | Temp 98.2°F | Resp 16 | Ht 63.0 in | Wt 113.3 lb

## 2023-11-18 DIAGNOSIS — I10 Essential (primary) hypertension: Secondary | ICD-10-CM | POA: Diagnosis not present

## 2023-11-18 DIAGNOSIS — K2210 Ulcer of esophagus without bleeding: Secondary | ICD-10-CM | POA: Insufficient documentation

## 2023-11-18 DIAGNOSIS — H1820 Unspecified corneal edema: Secondary | ICD-10-CM | POA: Insufficient documentation

## 2023-11-18 DIAGNOSIS — Z86711 Personal history of pulmonary embolism: Secondary | ICD-10-CM | POA: Insufficient documentation

## 2023-11-18 DIAGNOSIS — Z923 Personal history of irradiation: Secondary | ICD-10-CM | POA: Diagnosis not present

## 2023-11-18 DIAGNOSIS — J90 Pleural effusion, not elsewhere classified: Secondary | ICD-10-CM | POA: Diagnosis not present

## 2023-11-18 DIAGNOSIS — Z853 Personal history of malignant neoplasm of breast: Secondary | ICD-10-CM | POA: Insufficient documentation

## 2023-11-18 DIAGNOSIS — I3139 Other pericardial effusion (noninflammatory): Secondary | ICD-10-CM | POA: Insufficient documentation

## 2023-11-18 DIAGNOSIS — F32A Depression, unspecified: Secondary | ICD-10-CM | POA: Diagnosis not present

## 2023-11-18 DIAGNOSIS — J45909 Unspecified asthma, uncomplicated: Secondary | ICD-10-CM | POA: Diagnosis not present

## 2023-11-18 DIAGNOSIS — C779 Secondary and unspecified malignant neoplasm of lymph node, unspecified: Secondary | ICD-10-CM | POA: Insufficient documentation

## 2023-11-18 DIAGNOSIS — H209 Unspecified iridocyclitis: Secondary | ICD-10-CM | POA: Insufficient documentation

## 2023-11-18 DIAGNOSIS — I2699 Other pulmonary embolism without acute cor pulmonale: Secondary | ICD-10-CM | POA: Diagnosis not present

## 2023-11-18 DIAGNOSIS — D259 Leiomyoma of uterus, unspecified: Secondary | ICD-10-CM | POA: Diagnosis not present

## 2023-11-18 DIAGNOSIS — C158 Malignant neoplasm of overlapping sites of esophagus: Secondary | ICD-10-CM | POA: Diagnosis not present

## 2023-11-18 NOTE — Progress Notes (Signed)
[facility] OFFICE PROGRESS NOTE   Diagnosis: Esophagus cancer  INTERVAL HISTORY:   Jo Morrison returns as scheduled.  She reports a cough for the past month.  The cough is occasionally productive.  No fever or dyspnea.  No sinus congestion. A chest x-ray 10/13/2023 was negative. She went a restaging evaluation at [facility] 11/03/2023.  An upper endoscopy with biopsies revealed no evidence of recurrent esophagus cancer.  She underwent an esophagus dilation procedure.  She continues to have solid dysphagia.  Objective:  Vital signs in last 24 hours:  Blood pressure 137/79, pulse 100, temperature 98.2 F (36.8 C), temperature source Temporal, resp. rate 16, height 5\' 3"  (1.6 m), weight 113 lb 4.8 oz (51.4 kg), SpO2 99%.    HEENT: Neck without mass Lymphatics: No cervical, supraclavicular, axillary, or inguinal nodes Resp: Decreased breath sounds at the left lower posterior chest, no respiratory distress Cardio: Regular rate and rhythm GI: No hepatosplenomegaly, nontender, no mass Vascular: No leg edema   Lab Results:  Lab Results  Component Value Date   WBC 10.3 07/29/2023   HGB 13.7 07/29/2023   HCT 40.9 07/29/2023   MCV 98.1 07/29/2023   PLT 284 07/29/2023   NEUTROABS 7.9 (H) 07/29/2023    CMP  Lab Results  Component Value Date   NA 142 07/29/2023   K 4.7 07/29/2023   CL 102 07/29/2023   CO2 33 (H) 07/29/2023   GLUCOSE 114 (H) 07/29/2023   BUN 20 07/29/2023   CREATININE 1.15 (H) 07/29/2023   CALCIUM 9.4 07/29/2023   PROT 7.1 07/29/2023   ALBUMIN 4.1 07/29/2023   AST 16 07/29/2023   ALT 9 07/29/2023   ALKPHOS 58 07/29/2023   BILITOT 0.4 07/29/2023   GFRNONAA 49 (L) 07/29/2023   GFRAA >60 02/21/2020     Medications: I have reviewed the patient's current medications.   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  For hypermetabolic metastatic lymph nodes, 3 in the mediastinum and 1 in the upper abdomen gastrohepatic ligament.  EUS 02/10/2020-ulcerated mass in the mid esophagus,uT3, 3 suspicious lymph nodes near the primary tumor, malignant appearing lymph node at the gastrohepatic ligament (level 18)-metastatic squamous cell carcinoma Radiation 01/31/2020 Cycle 1 weekly Taxol/carboplatin 02/01/2020 Cycle 2 weekly Taxol/carboplatin 02/08/2020 Cycle 3 weekly Taxol/carboplatin 02/15/2020 Cycle 4 weekly Taxol/carboplatin 02/22/2020 Cycle 5 weekly Taxol/carboplatin 02/29/2020 Radiation completed 03/08/2020 CTs 04/03/2020-esophageal mass appears grossly smaller, regression of previously noted lymphadenopathy in the mediastinum and upper abdomen, nonocclusive pulmonary embolus right-sided pulmonary arteries Upper endoscopy 05/02/2020-treatment effect visible starting approximately 26 cm from the incisors.  Scope passed easily into the stomach and proximal duodenum.  Stomach and proximal duodenum grossly normal.  Scope withdrawn to the mid esophagus with four-quadrant biopsies obtained.  2 biopsy showed acute ulcerative esophagitis; 2 biopsies showed squamous mucosa with minimal histologic abnormality. CTs 07/24/2020-no evidence of metastatic disease, left gastric lymph node has decreased in size, mild wall thickening of the mid to distal esophagus Upper endoscopy 07/25/2020-multiple biopsies with changes of ulcerative esophagitis, negative fungal, HSV, and CMV stains, no evidence of malignancy CTs at [facility] 11/27/2020-interval decrease in prior mediastinal groundglass opacities;  decrease in now mild diffuse esophageal wall thickening, most prominent in the mid and distal esophagus; few scattered 3 mm and smaller noncalcified pulmonary nodules not significantly changed.  No definite new or enlarging pulmonary nodule.  No evidence of metastatic disease in the abdomen. EGD 11/28/2020-mild and discrete stricture in the distal esophagus; no mass lesions seen.  Biopsies taken, negative for malignancy x4; serial dilatation performed. CTs at [facility] 08/06/2021-no evidence of recurrent disease EGD 08/07/2021-no mass, mild stricture, multiple biopsies negative for carcinoma EGD at [facility] 12/25/2021-mild stricture, no mass, biopsies negative for carcinoma, 1 biopsy with possible low-grade dysplasia CTs 12/24/2021-negative for recurrent disease CTs at [facility] 07/01/2022-no evidence of recurrent disease Endoscopy 07/02/2022-negative for malignancy Progressive dysphagia/odynophagia status post EGD 12/17/2022-near complete obstruction UES, biopsy-moderately differentiated squamous cell carcinoma; PD-L1 CPS 45  PET scan at [facility] 12/18/2022-FDG avidity in the cervical esophagus.  No FDG avid metastatic disease. Radiation 01/02/2023-02/10/2023 Cycle 1 weekly Taxol/carboplatin 01/02/2023 Cycle 2 weekly Taxol/carboplatin 01/09/2023 Cycle 3 weekly Taxol/carboplatin 01/16/2023 Cycle 4 weekly Taxol/carboplatin 01/23/2023 Cycle 5 weekly Taxol/carboplatin 01/30/2023 CT neck 03/31/2023-resolution of upper esophagus mass, no evidence of metastatic disease CT chest 03/31/2023-similar wall thickening of the mid to distal esophagus, stable small bilateral pulmonary nodules, able small pericardial effusion, new small nonspecific bilateral pleural effusions Upper endoscopy 04/01/2023-multiple esophagus biopsies-esophagitis at 28 cm and 30 cm CTs 07/14/2023-no esophagus mass, no lymphadenopathy, mild diffuse esophageal wall thickening, unchanged bilateral pulmonary nodules, unchanged small bilateral pleural effusions and pericardial  effusion Upper endoscopy 07/15/2023-stricture at 14 cm, remainder of the esophagus and stomach appear normal, biopsies from 14, 16, 28, 30, and 32 cm-negative for malignancy, proximal esophagus dilated Upper endoscopy 11/04/2023-multiple biopsies negative for carcinoma, squamous mucosa with focal leukoplakia/epidermoid metaplasia at 30 cm CTs 11/03/2023: Increased right greater than left pleural effusions, change small bilateral pulmonary nodules, stable distal esophageal wall thickening, stable pericardial effusion  Right breast cancer July 1996, [name], [name], right lumpectomy and axillary dissection followed by adjuvant Adriamycin/Taxol chemotherapy-4 cycles, 5 years of tamoxifen, and breast radiation   3.   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  Depression   5.  Hypertension   6.  Right eye uveitis, macular edema, and corneal edema   7.  Asthma   8.  Pulmonary embolus on CT 04/03/2020-lovenox initiated, anticoagulation changed to apixaban beginning 08/03/2020, anticoagulation completed approximately the beginning of December 2021   9.  Odynophagia secondary to radiation -resolved       Disposition: Jo Morrison remains in clinical remission from esophagus cancer.  The small bilateral pulmonary fusions noted on the CT  11/03/2023 is likely a benign finding.  Bilateral pleural effusions were noted on CT in May of this year.  She will seek medical attention for a persistent cough or dyspnea.  She will follow-up with Dr. Glenna Durand for management of dysphagia, likely related to a radiation stricture.  She is scheduled for restaging evaluation at [facility] in March.  She will return for an office visit here in early April.  Thornton Papas, MD  11/18/2023  11:13 AM

## 2023-11-20 ENCOUNTER — Encounter: Payer: Self-pay | Admitting: Internal Medicine

## 2023-11-28 ENCOUNTER — Inpatient Hospital Stay
Admission: RE | Admit: 2023-11-28 | Discharge: 2023-11-28 | Disposition: A | Discharge status: Home / Self Care | Payer: Self-pay | Source: Ambulatory Visit | Attending: Oncology | Admitting: Oncology

## 2023-11-28 ENCOUNTER — Other Ambulatory Visit: Payer: Self-pay | Admitting: *Deleted

## 2023-11-28 DIAGNOSIS — Z8501 Personal history of malignant neoplasm of esophagus: Secondary | ICD-10-CM

## 2023-11-28 NOTE — Progress Notes (Signed)
 Requested w/radiology to import last CT chest images from [facility] dated 03/31/23, 07/14/23, and 11/03/23

## 2024-01-02 ENCOUNTER — Ambulatory Visit: Payer: Self-pay | Admitting: Internal Medicine

## 2024-01-02 NOTE — Telephone Encounter (Signed)
 Would need date of symptom onset

## 2024-01-02 NOTE — Telephone Encounter (Signed)
 Pt refused triage. Pt tested positive for Covid last night.  Wants mediation without being seen. RN couldn't offer virtual visit due to the quickness of call and pt hanging up on RN. The last words the pt stated are, I'll just take Tylenol .           Copied from CRM [id number]. Topic: Clinical - Medication Question >> Jan 02, 2024  8:09 AM Robinson H wrote: Reason for CRM: Patient states she tested positive for COVID last night, patient would like to know if provider can call in a prescription for her, states Dr. Rollene has prescribed it to her before doesn't remember the name of medication.  Lianne [phone] Reason for Disposition  Caller hangs up  Answer Assessment - Initial Assessment Questions 1. SITUATION:  Document reason for call.     Pt test positive for covid last night. Wants med without visit  2. BACKGROUND: Document any background information (e.g., prior calls, known psychiatric history)     na 3. ASSESSMENT: Document your nursing assessment.     Pt refused triage  4. RESPONSE: Document what your response or recommendation was.     Pt hung up on nurse  Protocols used: Difficult Call-A-AH

## 2024-01-03 ENCOUNTER — Encounter: Payer: Self-pay | Admitting: Oncology

## 2024-01-04 ENCOUNTER — Other Ambulatory Visit: Payer: Self-pay

## 2024-01-04 ENCOUNTER — Ambulatory Visit (INDEPENDENT_AMBULATORY_CARE_PROVIDER_SITE_OTHER): Payer: Medicare (Managed Care) | Admitting: Radiology

## 2024-01-04 ENCOUNTER — Ambulatory Visit
Admission: RE | Admit: 2024-01-04 | Discharge: 2024-01-04 | Disposition: A | Discharge status: Home / Self Care | Payer: Medicare (Managed Care) | Source: Ambulatory Visit | Attending: Family Medicine | Admitting: Family Medicine

## 2024-01-04 VITALS — BP 143/88 | HR 93 | Temp 99.8°F | Resp 18 | Ht 63.0 in | Wt 111.0 lb

## 2024-01-04 DIAGNOSIS — J209 Acute bronchitis, unspecified: Secondary | ICD-10-CM | POA: Diagnosis not present

## 2024-01-04 DIAGNOSIS — R051 Acute cough: Secondary | ICD-10-CM

## 2024-01-04 LAB — POC COVID19/FLU A&B COMBO
Covid Antigen, POC: NEGATIVE
Influenza A Antigen, POC: NEGATIVE
Influenza B Antigen, POC: NEGATIVE

## 2024-01-04 MED ORDER — BENZONATATE 100 MG PO CAPS: 100.0000 mg | ORAL_CAPSULE | Freq: Three times a day (TID) | ORAL | 0 refills | Status: DC

## 2024-01-04 MED ORDER — ALBUTEROL SULFATE HFA 108 (90 BASE) MCG/ACT IN AERS: 2.0000 | INHALATION_SPRAY | RESPIRATORY_TRACT | 0 refills | Status: DC | PRN

## 2024-01-04 NOTE — ED Provider Notes (Signed)
 [facility]    CSN: [id number] Arrival date & time: 01/04/24  1114      History   Chief Complaint Chief Complaint  Patient presents with   Cough    Congestion and coughing. Productive coughing. - Entered by patient    HPI Jo Morrison is a 78 y.o. female.   The history is provided by the patient.  Cough Cough for 5 days has had some sputum, was green yesterday but clear today.  Admits to fatigue and aches.  Admits sore throat, rhinorrhea and nasal congestion.  Admits headache.  Denies documented fever, chills, sweats, nausea, vomiting, diarrhea.  Denies confirmed COVID or flu exposures, denies recent travel.  Had a home COVID and flu test several days ago saw a pink line but did not  understand how to interpret the results  Past Medical History:  Diagnosis Date   Allergy    Anxiety    Asthma    Breast cancer (HCC) 2012   left   Breast cancer, right (HCC) 1996   right   Cataract    cataracts removed   Chronic uveitis of right eye    Colon polyps    Corneal edema of right eye    Cystoid macular edema of right eye    Depressive disorder    DVT (deep venous thrombosis) (HCC)    Family history of colon cancer    Glaucoma    not on eye drops at this time.   Hypertension    Hypertensive retinopathy of both eyes    Olecranon bursitis    Osteoarthritis    Other disorders of bone and cartilage(733.99)    Personal history of chemotherapy    1996   Personal history of radiation therapy    1996 & 2011   Pseudoexfoliation (PXF) glaucoma of both eyes    Pseudophakia, both eyes    Seasonal allergies    Unspecified pruritic disorder     Patient Active Problem List   Diagnosis Date Noted   CAP (community acquired pneumonia) 10/09/2023   Genetic testing 02/29/2020   Primary squamous cell carcinoma of overlapping sites of esophagus (HCC) 01/19/2020   Routine general medical examination at a health care facility 08/16/2016   Ductal carcinoma in situ (DCIS) of  left breast 10/28/2014   Contracture of palmar fascia (Dupuytren's) 07/13/2014   Hypertension    Anxiety    Asthma    Osteoarthritis    Glaucoma    Hx of adenomatous colonic polyps 11/01/2012   Breast cancer of lower-outer quadrant of right female breast (HCC) 10/12/2012    Past Surgical History:  Procedure Laterality Date   APPENDECTOMY  1958   BREAST LUMPECTOMY Right 1996   Dr. Channing Blush in [place]   BREAST LUMPECTOMY Left 2011   Dr. Olam Angle   cataract Bilateral    CATARACT EXTRACTION Bilateral    Dr. Meridee   COLONOSCOPY  2011-last   DUPUYTREN CONTRACTURE RELEASE     ESOPHAGOGASTRODUODENOSCOPY (EGD) WITH PROPOFOL  N/A 02/10/2020   Procedure: ESOPHAGOGASTRODUODENOSCOPY (EGD) WITH PROPOFOL ;  Surgeon: Teressa Toribio SQUIBB, MD;  Location: [facility];  Service: Endoscopy;  Laterality: N/A;   FINE NEEDLE ASPIRATION N/A 02/10/2020   Procedure: FINE NEEDLE ASPIRATION (FNA) LINEAR;  Surgeon: Teressa Toribio SQUIBB, MD;  Location: [facility];  Service: Endoscopy;  Laterality: N/A;   HAND SURGERY Right 2020   LASIK Bilateral 2007   POLYPECTOMY     ROTATOR CUFF REPAIR  2007   TONSILLECTOMY  1967  UPPER ESOPHAGEAL ENDOSCOPIC ULTRASOUND (EUS) N/A 02/10/2020   Procedure: UPPER ESOPHAGEAL ENDOSCOPIC ULTRASOUND (EUS);  Surgeon: Teressa Toribio SQUIBB, MD;  Location: [facility];  Service: Endoscopy;  Laterality: N/A;    OB History   No obstetric history on file.      Home Medications    Prior to Admission medications   Medication Sig Start Date End Date Taking? Authorizing Provider  brimonidine (ALPHAGAN) 0.2 % ophthalmic solution Place 1 drop into the right eye 3 (three) times daily. 12/23/22   [provider]  clonazePAM  (KLONOPIN ) 0.5 MG tablet Take 0.5-1 tablets (0.25-0.5 mg total) by mouth 2 (two) times daily as needed. 02/15/22   Rhys Boyer T, PA-C  fluticasone  (FLONASE) 50 MCG/ACT nasal spray Place 2 sprays into the nose daily as needed for allergies.  Patient not  taking: Reported on 11/18/2023 07/20/18   [provider]  fluticasone  furoate-vilanterol (BREO ELLIPTA ) 200-25 MCG/ACT AEPB Inhale 1 puff into the lungs daily. 08/25/23   Rollene Almarie LABOR, MD  hydrochlorothiazide  (HYDRODIURIL ) 25 MG tablet Take 1 tablet (25 mg total) by mouth daily. 08/25/23   Rollene Almarie LABOR, MD  loteprednol  (LOTEMAX ) 0.5 % ophthalmic suspension Place 1 drop into the right eye 4 (four) times daily. 01/17/20   [provider]  montelukast  (SINGULAIR ) 10 MG tablet Take 1 tablet (10 mg total) by mouth at bedtime. 08/25/23   Rollene Almarie LABOR, MD  REFRESH OPTIVE ADVANCED 0.5-1-0.5 % SOLN Place 1 drop into both eyes daily as needed (itching Eyes).  Patient not taking: Reported on 11/18/2023 11/11/19   [provider]  traZODone  (DESYREL ) 100 MG tablet Take 1-2 tablets (100-200 mg total) by mouth at bedtime as needed for sleep. 02/14/23   Rhys Boyer T, PA-C  venlafaxine  XR (EFFEXOR -XR) 150 MG 24 hr capsule Take 1 capsule (150 mg total) by mouth daily. 02/14/23   Rhys Boyer DASEN, PA-C    Family History Family History  Problem Relation Age of Onset   Stroke Father 76   Heart disease Brother    Colon cancer Maternal Aunt 81   Macular degeneration Mother    Cancer Maternal Grandmother        unknown type   Colon polyps Neg Hx    Esophageal cancer Neg Hx    Rectal cancer Neg Hx    Stomach cancer Neg Hx     Social History Social History   Tobacco Use   Smoking status: Never   Smokeless tobacco: Never  Vaping Use   Vaping status: Never Used  Substance Use Topics   Alcohol use: Yes    Alcohol/week: 6.0 standard drinks of alcohol    Types: 6 Glasses of wine per week    Comment: daily   Drug use: No     Allergies   Latex   Review of Systems Review of Systems  Respiratory:  Positive for cough.      Physical Exam Triage Vital Signs ED Triage Vitals  Encounter Vitals Group     BP 01/04/24 1125 (!) 143/88     Systolic BP  Percentile --      Diastolic BP Percentile --      Pulse Rate 01/04/24 1125 93     Resp 01/04/24 1125 18     Temp 01/04/24 1125 99.8 F (37.7 C)     Temp Source 01/04/24 1125 Oral     SpO2 01/04/24 1125 93 %     Weight 01/04/24 1135 111 lb (50.3 kg)  Height 01/04/24 1135 5' 3 (1.6 m)     Head Circumference --      Peak Flow --      Pain Score 01/04/24 1135 5     Pain Loc --      Pain Education --      Exclude from Growth Chart --    No data found.  Updated Vital Signs BP (!) 143/88 (BP Location: Right Arm)   Pulse 93   Temp 99.8 F (37.7 C) (Oral)   Resp 18   Ht 5' 3 (1.6 m)   Wt 111 lb (50.3 kg)   SpO2 93%   BMI 19.66 kg/m   Visual Acuity Right Eye Distance:   Left Eye Distance:   Bilateral Distance:    Right Eye Near:   Left Eye Near:    Bilateral Near:     Physical Exam Constitutional:      Comments: Coughing frequently but able to speak in full sentences  HENT:     Head: Normocephalic and atraumatic.     Right Ear: Tympanic membrane and ear canal normal.     Left Ear: Tympanic membrane and ear canal normal.     Nose: No rhinorrhea.     Mouth/Throat:     Mouth: Mucous membranes are moist.  Eyes:     Conjunctiva/sclera: Conjunctivae normal.  Cardiovascular:     Rate and Rhythm: Normal rate and regular rhythm.     Heart sounds: Normal heart sounds.  Pulmonary:     Effort: Pulmonary effort is normal. No respiratory distress.     Breath sounds: No wheezing, rhonchi or rales.     Comments: O2 saturation 92 to 94% on room air Musculoskeletal:     Right lower leg: No edema.     Left lower leg: No edema.  Skin:    General: Skin is warm and dry.  Neurological:     Mental Status: She is alert.      UC Treatments / Results  Labs (all labs ordered are listed, but only abnormal results are displayed) Labs Reviewed  POC COVID19/FLU A&B COMBO    EKG   Radiology No results found.  Procedures Procedures (including critical care  time)  Medications Ordered in UC Medications - No data to display  Initial Impression / Assessment and Plan / UC Course  I have reviewed the triage vital signs and the nursing notes.  Pertinent labs & imaging results that were available during my care of the patient were reviewed by me and considered in my medical decision making (see chart for details).     78 year old with productive cough for several days no fever. Point-of-care COVID and flu are negative, chest x-ray was obtained no evidence of pneumonia.  Oxygen saturation 92 to 94%, lungs are clear to auscultation.  Discussed with patient bronchitis will treat with inhaler and Tessalon  Perles she was instructed to take over-the-counter Mucinex, follow-up with PCP, ED for worsening symptoms or concerns Final Clinical Impressions(s) / UC Diagnoses   Final diagnoses:  Acute cough   Discharge Instructions   None    ED Prescriptions   None    PDMP not reviewed this encounter.   Alsie Younes, [place] 01/04/24 1234

## 2024-01-04 NOTE — ED Triage Notes (Addendum)
 Pt presents with complaints of dry, productive cough with yellow mucus x 5 days. Pt is also reporting splitting headaches and difficulty breathing but believes it is due to her strong cough. Pt currently rates her pain a 5/10, states she has a aching headache now. OTC cough & congestion medicine taken, [facility] brand, with no relief. Tylenol  also taken. Denies fevers at home.

## 2024-01-22 ENCOUNTER — Encounter: Payer: Self-pay | Admitting: Oncology

## 2024-01-28 ENCOUNTER — Encounter: Payer: Self-pay | Admitting: Oncology

## 2024-02-02 ENCOUNTER — Encounter: Payer: Self-pay | Admitting: Oncology

## 2024-02-09 DIAGNOSIS — Z01818 Encounter for other preprocedural examination: Secondary | ICD-10-CM | POA: Diagnosis not present

## 2024-02-09 DIAGNOSIS — Z9013 Acquired absence of bilateral breasts and nipples: Secondary | ICD-10-CM | POA: Diagnosis not present

## 2024-02-09 DIAGNOSIS — Z9221 Personal history of antineoplastic chemotherapy: Secondary | ICD-10-CM | POA: Diagnosis not present

## 2024-02-09 DIAGNOSIS — Z79899 Other long term (current) drug therapy: Secondary | ICD-10-CM | POA: Diagnosis not present

## 2024-02-09 DIAGNOSIS — J4520 Mild intermittent asthma, uncomplicated: Secondary | ICD-10-CM | POA: Diagnosis not present

## 2024-02-09 DIAGNOSIS — R590 Localized enlarged lymph nodes: Secondary | ICD-10-CM | POA: Diagnosis not present

## 2024-02-09 DIAGNOSIS — Z86711 Personal history of pulmonary embolism: Secondary | ICD-10-CM | POA: Diagnosis not present

## 2024-02-09 DIAGNOSIS — I10 Essential (primary) hypertension: Secondary | ICD-10-CM | POA: Diagnosis not present

## 2024-02-09 DIAGNOSIS — Z923 Personal history of irradiation: Secondary | ICD-10-CM | POA: Diagnosis not present

## 2024-02-09 DIAGNOSIS — C158 Malignant neoplasm of overlapping sites of esophagus: Secondary | ICD-10-CM | POA: Diagnosis not present

## 2024-02-09 DIAGNOSIS — Z853 Personal history of malignant neoplasm of breast: Secondary | ICD-10-CM | POA: Diagnosis not present

## 2024-02-09 DIAGNOSIS — R918 Other nonspecific abnormal finding of lung field: Secondary | ICD-10-CM | POA: Diagnosis not present

## 2024-02-16 ENCOUNTER — Ambulatory Visit: Payer: PPO | Admitting: Physician Assistant

## 2024-02-17 DIAGNOSIS — J4520 Mild intermittent asthma, uncomplicated: Secondary | ICD-10-CM | POA: Diagnosis not present

## 2024-02-17 DIAGNOSIS — Z860101 Personal history of adenomatous and serrated colon polyps: Secondary | ICD-10-CM | POA: Diagnosis not present

## 2024-02-17 DIAGNOSIS — Z853 Personal history of malignant neoplasm of breast: Secondary | ICD-10-CM | POA: Diagnosis not present

## 2024-02-17 DIAGNOSIS — Z86711 Personal history of pulmonary embolism: Secondary | ICD-10-CM | POA: Diagnosis not present

## 2024-02-17 DIAGNOSIS — K222 Esophageal obstruction: Secondary | ICD-10-CM | POA: Diagnosis not present

## 2024-02-17 DIAGNOSIS — J45909 Unspecified asthma, uncomplicated: Secondary | ICD-10-CM | POA: Diagnosis not present

## 2024-02-17 DIAGNOSIS — Z08 Encounter for follow-up examination after completed treatment for malignant neoplasm: Secondary | ICD-10-CM | POA: Diagnosis not present

## 2024-02-17 DIAGNOSIS — Z8501 Personal history of malignant neoplasm of esophagus: Secondary | ICD-10-CM | POA: Diagnosis not present

## 2024-02-17 DIAGNOSIS — Z923 Personal history of irradiation: Secondary | ICD-10-CM | POA: Diagnosis not present

## 2024-02-17 DIAGNOSIS — Z9221 Personal history of antineoplastic chemotherapy: Secondary | ICD-10-CM | POA: Diagnosis not present

## 2024-02-17 DIAGNOSIS — Z79899 Other long term (current) drug therapy: Secondary | ICD-10-CM | POA: Diagnosis not present

## 2024-02-17 DIAGNOSIS — Z91040 Latex allergy status: Secondary | ICD-10-CM | POA: Diagnosis not present

## 2024-02-17 DIAGNOSIS — I10 Essential (primary) hypertension: Secondary | ICD-10-CM | POA: Diagnosis not present

## 2024-02-25 DIAGNOSIS — H401431 Capsular glaucoma with pseudoexfoliation of lens, bilateral, mild stage: Secondary | ICD-10-CM | POA: Diagnosis not present

## 2024-02-29 ENCOUNTER — Other Ambulatory Visit: Payer: Self-pay | Admitting: Physician Assistant

## 2024-03-04 ENCOUNTER — Inpatient Hospital Stay: Payer: Medicare (Managed Care) | Attending: Oncology | Admitting: Nurse Practitioner

## 2024-03-04 ENCOUNTER — Encounter: Payer: Self-pay | Admitting: Nurse Practitioner

## 2024-03-04 VITALS — BP 114/82 | HR 90 | Temp 98.2°F | Resp 16 | Ht 63.0 in | Wt 107.5 lb

## 2024-03-04 DIAGNOSIS — Z923 Personal history of irradiation: Secondary | ICD-10-CM | POA: Insufficient documentation

## 2024-03-04 DIAGNOSIS — C158 Malignant neoplasm of overlapping sites of esophagus: Secondary | ICD-10-CM | POA: Diagnosis not present

## 2024-03-04 DIAGNOSIS — Z853 Personal history of malignant neoplasm of breast: Secondary | ICD-10-CM | POA: Diagnosis not present

## 2024-03-04 DIAGNOSIS — Z8501 Personal history of malignant neoplasm of esophagus: Secondary | ICD-10-CM | POA: Diagnosis present

## 2024-03-04 DIAGNOSIS — Z9221 Personal history of antineoplastic chemotherapy: Secondary | ICD-10-CM | POA: Diagnosis not present

## 2024-03-04 DIAGNOSIS — R918 Other nonspecific abnormal finding of lung field: Secondary | ICD-10-CM | POA: Diagnosis not present

## 2024-03-04 NOTE — Progress Notes (Signed)
 [facility] OFFICE PROGRESS NOTE   Diagnosis: Esophagus cancer   INTERVAL HISTORY:   Jo Morrison returns as scheduled.  She underwent an upper endoscopy with biopsies 02/17/2024.  There was a stricture at 14 cm.  Multiple biopsies were obtained, all negative for dysplasia and malignancy.  She feels well.  She is tolerating a regular diet.  No dysphagia.  She has a good appetite and good energy level.  No nausea or vomiting.  No constipation or diarrhea.  Objective:  Vital signs in last 24 hours:  Blood pressure 114/82, pulse 90, temperature 98.2 F (36.8 C), temperature source Temporal, resp. rate 16, height 5\' 3"  (1.6 m), weight 107 lb 8 oz (48.8 kg), SpO2 100%.    HEENT: No thrush or ulcers. Lymphatics: No palpable cervical, supraclavicular, axillary or inguinal lymph nodes. Resp: Lungs clear bilaterally. Cardio: Regular rate and rhythm. GI: No hepatosplenomegaly. Vascular: No leg edema.   Lab Results:  Lab Results  Component Value Date   WBC 10.3 07/29/2023   HGB 13.7 07/29/2023   HCT 40.9 07/29/2023   MCV 98.1 07/29/2023   PLT 284 07/29/2023   NEUTROABS 7.9 (H) 07/29/2023    Imaging:  No results found.  Medications: I have reviewed the patient's current medications.  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  For hypermetabolic metastatic lymph nodes, 3 in the mediastinum and 1 in the upper abdomen gastrohepatic ligament.  EUS 02/10/2020-ulcerated mass in the mid  esophagus,uT3, 3 suspicious lymph nodes near the primary tumor, malignant appearing lymph node at the gastrohepatic ligament (level 18)-metastatic squamous cell carcinoma Radiation 01/31/2020 Cycle 1 weekly Taxol/carboplatin 02/01/2020 Cycle 2 weekly Taxol/carboplatin 02/08/2020 Cycle 3 weekly Taxol/carboplatin 02/15/2020 Cycle 4 weekly Taxol/carboplatin 02/22/2020 Cycle 5 weekly Taxol/carboplatin 02/29/2020 Radiation completed 03/08/2020 CTs 04/03/2020-esophageal mass appears grossly smaller, regression of previously noted lymphadenopathy in the mediastinum and upper abdomen, nonocclusive pulmonary embolus right-sided pulmonary arteries Upper endoscopy 05/02/2020-treatment effect visible starting approximately 26 cm from the incisors.  Scope passed easily into the stomach and proximal duodenum.  Stomach and proximal duodenum grossly normal.  Scope withdrawn to the mid esophagus with four-quadrant biopsies obtained.  2 biopsy showed acute ulcerative esophagitis; 2 biopsies showed squamous mucosa with minimal histologic abnormality. CTs 07/24/2020-no evidence of metastatic disease, left gastric lymph node has decreased in size, mild wall thickening of the mid to distal esophagus Upper endoscopy 07/25/2020-multiple biopsies with changes of ulcerative esophagitis, negative fungal, HSV, and CMV stains, no evidence of malignancy CTs at [facility] 11/27/2020-interval decrease in prior mediastinal groundglass opacities; decrease in now mild diffuse esophageal wall thickening, most prominent in the mid and distal esophagus; few scattered 3 mm and smaller noncalcified pulmonary nodules not significantly changed.  No definite new or enlarging pulmonary nodule.  No evidence of metastatic disease in the abdomen. EGD 11/28/2020-mild and discrete stricture in the distal esophagus; no mass lesions seen.  Biopsies taken, negative for malignancy x4; serial dilatation performed. CTs at [facility] 08/06/2021-no evidence of recurrent disease EGD  08/07/2021-no mass, mild stricture, multiple biopsies negative for carcinoma EGD at [facility] 12/25/2021-mild stricture, no mass, biopsies negative for carcinoma, 1 biopsy with possible low-grade  dysplasia CTs 12/24/2021-negative for recurrent disease CTs at [facility] 07/01/2022-no evidence of recurrent disease Endoscopy 07/02/2022-negative for malignancy Progressive dysphagia/odynophagia status post EGD 12/17/2022-near complete obstruction UES, biopsy-moderately differentiated squamous cell carcinoma; PD-L1 CPS 45  PET scan at [facility] 12/18/2022-FDG avidity in the cervical esophagus.  No FDG avid metastatic disease. Radiation 01/02/2023-02/10/2023 Cycle 1 weekly Taxol/carboplatin 01/02/2023 Cycle 2 weekly Taxol/carboplatin 01/09/2023 Cycle 3 weekly Taxol/carboplatin 01/16/2023 Cycle 4 weekly Taxol/carboplatin 01/23/2023 Cycle 5 weekly Taxol/carboplatin 01/30/2023 CT neck 03/31/2023-resolution of upper esophagus mass, no evidence of metastatic disease CT chest 03/31/2023-similar wall thickening of the mid to distal esophagus, stable small bilateral pulmonary nodules, able small pericardial effusion, new small nonspecific bilateral pleural effusions Upper endoscopy 04/01/2023-multiple esophagus biopsies-esophagitis at 28 cm and 30 cm CTs 07/14/2023-no esophagus mass, no lymphadenopathy, mild diffuse esophageal wall thickening, unchanged bilateral pulmonary nodules, unchanged small bilateral pleural effusions and pericardial effusion Upper endoscopy 07/15/2023-stricture at 14 cm, remainder of the esophagus and stomach appear normal, biopsies from 14, 16, 28, 30, and 32 cm-negative for malignancy, proximal esophagus dilated Upper endoscopy 11/04/2023-multiple biopsies negative for carcinoma, squamous mucosa with focal leukoplakia/epidermoid metaplasia at 30 cm CTs 11/03/2023: Increased right greater than left pleural effusions, change small bilateral pulmonary nodules, stable distal esophageal wall thickening, stable pericardial  effusion Upper endoscopy 02/17/2024-stricture at 14 cm.  Multiple biopsies were obtained, all negative for dysplasia or malignancy. CTs 02/09/2024-mild interval enlargement of a right supraclavicular lymph node measuring 6 mm, previously 3 mm; new clustered airway centered nodules in the dependent lungs with an approximately 1 cm more discrete nodule versus nodular focus of consolidation at the right lung base.  No metastatic disease in the abdomen.   Right breast cancer July 1996, [name], [name], right lumpectomy and axillary dissection followed by adjuvant Adriamycin/Taxol chemotherapy-4 cycles, 5 years of tamoxifen, and breast radiation   3.   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  Depression   5.  Hypertension   6.  Right eye uveitis, macular edema, and corneal edema   7.  Asthma   8.  Pulmonary embolus on CT 04/03/2020-lovenox initiated, anticoagulation changed to apixaban beginning 08/03/2020, anticoagulation completed approximately the beginning of December 2021   9.  Odynophagia secondary to radiation -resolved  Disposition: Jo Morrison appears stable.  She remains in clinical remission from esophagus cancer.  On recent CTs a right supraclavicular lymph node was slightly larger and there were new clustered airway centered nodules in dependent lungs.  [name] CTs are planned at [facility] in July.  She will return for a follow-up visit here in early August.  We are available to see her sooner if needed.    Lonna Cobb ANP/GNP-BC   03/04/2024  11:17 AM

## 2024-03-06 ENCOUNTER — Other Ambulatory Visit: Payer: Self-pay

## 2024-03-29 DIAGNOSIS — H04123 Dry eye syndrome of bilateral lacrimal glands: Secondary | ICD-10-CM | POA: Diagnosis not present

## 2024-03-29 DIAGNOSIS — Z947 Corneal transplant status: Secondary | ICD-10-CM | POA: Diagnosis not present

## 2024-03-29 DIAGNOSIS — H401431 Capsular glaucoma with pseudoexfoliation of lens, bilateral, mild stage: Secondary | ICD-10-CM | POA: Diagnosis not present

## 2024-04-07 ENCOUNTER — Encounter: Payer: Self-pay | Admitting: Physician Assistant

## 2024-04-07 ENCOUNTER — Ambulatory Visit (INDEPENDENT_AMBULATORY_CARE_PROVIDER_SITE_OTHER): Payer: Medicare (Managed Care) | Admitting: Physician Assistant

## 2024-04-07 DIAGNOSIS — G4700 Insomnia, unspecified: Secondary | ICD-10-CM

## 2024-04-07 DIAGNOSIS — F3342 Major depressive disorder, recurrent, in full remission: Secondary | ICD-10-CM | POA: Diagnosis not present

## 2024-04-07 DIAGNOSIS — F411 Generalized anxiety disorder: Secondary | ICD-10-CM | POA: Diagnosis not present

## 2024-04-07 MED ORDER — CLONAZEPAM 0.5 MG PO TABS: 0.2500 mg | ORAL_TABLET | Freq: Two times a day (BID) | ORAL | 0 refills | Status: AC | PRN

## 2024-04-07 NOTE — Progress Notes (Signed)
 [facility] Check  Patient ID: Jo Morrison,  MRN: [id number]  PCP: Adelia Homestead, MD  Date of Evaluation: 04/07/2024 Time spent:20 minutes  Chief Complaint:  Chief Complaint   Anxiety; Depression; [name]    HISTORY/CURRENT STATUS: HPI for annual medication check.  Jo Morrison has had a rough year, husband's health is declining. See Social History. She is in remission with esophageal cancer. Glaucoma isn't well treated, seeing another specialist tomorrow. Overall though, she feels like her meds are working well and she doesn't want to change anything. She enjoys outdoor flower gardening, and keeping their 4 yo friend's dtr several days a week for a few hours, 'that's a joy and we love it.' They have 4 cats.   Energy and motivation are good.  No extreme sadness, tearfulness, or feelings of hopelessness.  Sleeps well most of the time with the Trazodone  which she takes every night. ADLs and personal hygiene are normal.   Denies any changes in concentration, making decisions, or remembering things.  Appetite has not changed.  Weight is stable. Has anxiety occas and takes the klonopin  very infrequently.  Last Rx was in 2023.  Denies suicidal or homicidal thoughts.  Patient denies increased energy with decreased need for sleep, increased talkativeness, racing thoughts, impulsivity or risky behaviors, increased spending, increased libido, grandiosity, increased irritability or anger, paranoia, or hallucinations.  Denies dizziness, syncope, seizures, numbness, tingling, tremor, tics, unsteady gait, slurred speech, confusion. Denies muscle or joint pain, stiffness, or dystonia.  Individual Medical History/ Review of Systems: Changes? :Yes    Esophageal cancer is in remission. Sees  Dr at [facility] and Dr. Scherrie Curt   Past medications for mental health diagnoses include: Effexor  XR, trazodone , Klonopin , Sonata  Allergies: Latex  Current Medications:  Current Outpatient Medications:     brimonidine (ALPHAGAN) 0.2 % ophthalmic solution, Place 1 drop into the right eye 3 (three) times daily., Disp: , Rfl:    fluticasone  (FLONASE) 50 MCG/ACT nasal spray, Place 2 sprays into the nose daily as needed for allergies., Disp: , Rfl:    fluticasone  furoate-vilanterol (BREO ELLIPTA ) 200-25 MCG/ACT AEPB, Inhale 1 puff into the lungs daily., Disp: 3 each, Rfl: 3   hydrochlorothiazide  (HYDRODIURIL ) 25 MG tablet, Take 1 tablet (25 mg total) by mouth daily., Disp: 90 tablet, Rfl: 3   loteprednol  (LOTEMAX ) 0.5 % ophthalmic suspension, Place 1 drop into the right eye 4 (four) times daily., Disp: , Rfl:    montelukast  (SINGULAIR ) 10 MG tablet, Take 1 tablet (10 mg total) by mouth at bedtime., Disp: 90 tablet, Rfl: 3   REFRESH OPTIVE ADVANCED 0.5-1-0.5 % SOLN, Place 1 drop into both eyes daily as needed (itching Eyes)., Disp: , Rfl:    traZODone  (DESYREL ) 100 MG tablet, TAKE 1 TO 2 TABLETS(100 TO 200 MG) BY MOUTH AT BEDTIME AS NEEDED FOR SLEEP, Disp: 180 tablet, Rfl: 3   venlafaxine  XR (EFFEXOR -XR) 150 MG 24 hr capsule, TAKE 1 CAPSULE(150 MG) BY MOUTH DAILY, Disp: 90 capsule, Rfl: 3   clonazePAM  (KLONOPIN ) 0.5 MG tablet, Take 0.5-1 tablets (0.25-0.5 mg total) by mouth 2 (two) times daily as needed., Disp: 30 tablet, Rfl: 0 Medication Side Effects: none  Family Medical/ Social History: Changes? Husband's health is declining. He's fallen several times.  Had concussion once, needed stitches.  She was afraid she  would have to put him in a home, but she really didn't want to.  He is improving, in PT now.   MENTAL HEALTH EXAM:  There were no vitals taken  for this visit.There is no height or weight on file to calculate BMI.  General Appearance: Casual and Well Groomed  Eye Contact:  Good  Speech:  Clear and Coherent and Normal Rate  Volume:  Normal  Mood:  Euthymic  Affect:  Appropriate  Thought Process:  Goal Directed and Descriptions of Associations: Circumstantial  Orientation:  Full (Time, Place,  and Person)  Thought Content: Logical   Suicidal Thoughts:  No  Homicidal Thoughts:  No  Memory:  WNL  Judgement:  Good  Insight:  Good  Psychomotor Activity:  Normal  Concentration:  Concentration: Good  Recall:  Good  Fund of Knowledge: Good  Language: Good  Assets:  Communication Skills Desire for Improvement Financial Resources/Insurance Housing Transportation  ADL's:  Intact  Cognition: WNL  Prognosis:  Good   DIAGNOSES:    ICD-10-CM   1. Recurrent major depressive disorder, in full remission (HCC)  F33.42     2. Generalized anxiety disorder  F41.1     3. Insomnia, unspecified type  G47.00       Receiving Psychotherapy: No   RECOMMENDATIONS:  PDMP was reviewed.  Klonopin  filled 02/03/2023. I provided 20 minutes of face to face time during this encounter, including time spent before and after the visit in records review, medical decision making, counseling pertinent to today's visit, and charting.   As far as her mental health medications go she is doing well so no changes will be made.  I hope her husband's balance and strength improves with PT.  Continue Klonopin  0.5 mg, 1/2-1 p.o. twice daily as needed.  (She rarely takes.) Continue trazodone  100 mg, 1-2 nightly as needed sleep. Continue Effexor  XR 150 mg, 1 p.o. daily. Return in 1 year, she prefers to continue annual follow-ups.  She will call if needed prior to that time.  Marvia Slocumb, PA-C

## 2024-04-08 ENCOUNTER — Other Ambulatory Visit: Payer: Self-pay

## 2024-04-08 DIAGNOSIS — H401434 Capsular glaucoma with pseudoexfoliation of lens, bilateral, indeterminate stage: Secondary | ICD-10-CM | POA: Diagnosis not present

## 2024-04-08 DIAGNOSIS — H2011 Chronic iridocyclitis, right eye: Secondary | ICD-10-CM | POA: Diagnosis not present

## 2024-04-08 DIAGNOSIS — Z947 Corneal transplant status: Secondary | ICD-10-CM | POA: Diagnosis not present

## 2024-04-14 DIAGNOSIS — H401114 Primary open-angle glaucoma, right eye, indeterminate stage: Secondary | ICD-10-CM | POA: Diagnosis not present

## 2024-05-03 DIAGNOSIS — H401431 Capsular glaucoma with pseudoexfoliation of lens, bilateral, mild stage: Secondary | ICD-10-CM | POA: Diagnosis not present

## 2024-05-03 DIAGNOSIS — Z947 Corneal transplant status: Secondary | ICD-10-CM | POA: Diagnosis not present

## 2024-05-27 ENCOUNTER — Other Ambulatory Visit: Payer: Self-pay | Admitting: Internal Medicine

## 2024-05-31 ENCOUNTER — Other Ambulatory Visit: Payer: Self-pay | Admitting: Internal Medicine

## 2024-05-31 NOTE — Telephone Encounter (Unsigned)
 Copied from CRM [id number]. Topic: Clinical - Medication Refill >> May 31, 2024 11:36 AM Henretta I wrote: Medication: montelukast  (SINGULAIR ) 10 MG tablet   Has the patient contacted their pharmacy? Yes, pharm called to request refill  (Agent: If no, request that the patient contact the pharmacy for the refill. If patient does not wish to contact the pharmacy document the reason why and proceed with request.) (Agent: If yes, when and what did the pharmacy advise?)  This is the patient's preferred pharmacy:  [id number] - [place], [place] - [address] AT [address] [place] [address] Phone: [phone] Fax: [phone]  Is this the correct pharmacy for this prescription? Yes If no, delete pharmacy and type the correct one.   Has the prescription been filled recently? No  Is the patient out of the medication? No  Has the patient been seen for an appointment in the last year OR does the patient have an upcoming appointment? Yes  Can we respond through MyChart? Yes  Agent: Please be advised that Rx refills may take up to 3 business days. We ask that you follow-up with your pharmacy.

## 2024-06-03 DIAGNOSIS — Z9689 Presence of other specified functional implants: Secondary | ICD-10-CM | POA: Diagnosis not present

## 2024-06-03 DIAGNOSIS — H401434 Capsular glaucoma with pseudoexfoliation of lens, bilateral, indeterminate stage: Secondary | ICD-10-CM | POA: Diagnosis not present

## 2024-06-03 DIAGNOSIS — Z947 Corneal transplant status: Secondary | ICD-10-CM | POA: Diagnosis not present

## 2024-06-03 DIAGNOSIS — H2011 Chronic iridocyclitis, right eye: Secondary | ICD-10-CM | POA: Diagnosis not present

## 2024-06-15 ENCOUNTER — Ambulatory Visit (INDEPENDENT_AMBULATORY_CARE_PROVIDER_SITE_OTHER): Payer: Medicare (Managed Care)

## 2024-06-15 VITALS — Ht 63.0 in | Wt 110.0 lb

## 2024-06-15 DIAGNOSIS — K635 Polyp of colon: Secondary | ICD-10-CM | POA: Diagnosis not present

## 2024-06-15 DIAGNOSIS — Z0000 Encounter for general adult medical examination without abnormal findings: Secondary | ICD-10-CM | POA: Diagnosis not present

## 2024-06-15 NOTE — Progress Notes (Signed)
 Subjective:  Please attest and cosign this visit due to patients primary care provider not being in the office at the time the visit was completed.  (Pt of Dr Almarie Cleveland)   Jo Morrison is a 78 y.o. who presents for a Medicare Wellness preventive visit.  As a reminder, Annual Wellness Visits don't include a physical exam, and some assessments may be limited, especially if this visit is performed virtually. We may recommend an in-person follow-up visit with your provider if needed.  Visit Complete: Virtual I connected with  KHADIJAH MASTRIANNI on 06/15/24 by a audio enabled telemedicine application and verified that I am speaking with the correct person using two identifiers.  Patient Location: Home  Provider Location: Office/Clinic  I discussed the limitations of evaluation and management by telemedicine. The patient expressed understanding and agreed to proceed.  Vital Signs: Because this visit was a virtual/telehealth visit, some criteria may be missing or patient reported. Any vitals not documented were not able to be obtained and vitals that have been documented are patient reported.  VideoDeclined- This patient declined Librarian, academic. Therefore the visit was completed with audio only.  Persons Participating in Visit: Patient.  AWV Questionnaire: Yes: Patient Medicare AWV questionnaire was completed by the patient on 06/13/2024; I have confirmed that all information answered by patient is correct and no changes since this date.  Cardiac Risk Factors include: advanced age (>88men, >38 women);hypertension     Objective:    Today's Vitals   06/15/24 0812  Weight: 110 lb (49.9 kg)  Height: 5' 3 (1.6 m)   Body mass index is 19.49 kg/m.     06/15/2024    8:12 AM 03/04/2024   10:51 AM 07/29/2023    9:18 AM 06/12/2023    9:05 AM 02/12/2023   11:25 AM 01/30/2023   12:40 PM 01/23/2023   11:08 AM  Advanced Directives  Does Patient Have a Medical  Advance Directive? Yes Yes Yes Yes Yes Yes No  Type of Estate agent of [place];Living will Healthcare Power of [place];Living will Healthcare Power of [place];Living will Healthcare Power of [place];Living will Healthcare Power of [place];Living will Healthcare Power of [place];Living will Living will;Healthcare Power of Attorney  Does patient want to make changes to medical advance directive? No - Patient declined No - Patient declined No - Patient declined  No - Patient declined No - Patient declined No - Patient declined  Copy of Healthcare Power of Attorney in Chart? Yes - validated most recent copy scanned in chart (See row information) Yes - validated most recent copy scanned in chart (See row information) Yes - validated most recent copy scanned in chart (See row information) Yes - validated most recent copy scanned in chart (See row information) Yes - validated most recent copy scanned in chart (See row information) Yes - validated most recent copy scanned in chart (See row information)   Would patient like information on creating a medical advance directive?       No - Patient declined    Current Medications (verified) Outpatient Encounter Medications as of 06/15/2024  Medication Sig   clonazePAM  (KLONOPIN ) 0.5 MG tablet Take 0.5-1 tablets (0.25-0.5 mg total) by mouth 2 (two) times daily as needed.   fluticasone  (FLONASE) 50 MCG/ACT nasal spray Place 2 sprays into the nose daily as needed for allergies.   fluticasone  furoate-vilanterol (BREO ELLIPTA ) 200-25 MCG/ACT AEPB Inhale 1 puff into the lungs daily.   hydrochlorothiazide  (HYDRODIURIL ) 25 MG  tablet Take 1 tablet (25 mg total) by mouth daily.   montelukast  (SINGULAIR ) 10 MG tablet TAKE 1 TABLET(10 MG) BY MOUTH AT BEDTIME   REFRESH OPTIVE ADVANCED 0.5-1-0.5 % SOLN Place 1 drop into both eyes daily as needed (itching Eyes).   traZODone  (DESYREL ) 100 MG tablet TAKE 1 TO 2 TABLETS(100 TO 200 MG) BY MOUTH AT BEDTIME  AS NEEDED FOR SLEEP   venlafaxine  XR (EFFEXOR -XR) 150 MG 24 hr capsule TAKE 1 CAPSULE(150 MG) BY MOUTH DAILY   [DISCONTINUED] brimonidine (ALPHAGAN) 0.2 % ophthalmic solution Place 1 drop into the right eye 3 (three) times daily.   [DISCONTINUED] loteprednol  (LOTEMAX ) 0.5 % ophthalmic suspension Place 1 drop into the right eye 4 (four) times daily.   No facility-administered encounter medications on file as of 06/15/2024.    Allergies (verified) Latex   History: Past Medical History:  Diagnosis Date   Allergy    Anxiety    Asthma    Breast cancer (HCC) 2012   left   Breast cancer, right (HCC) 1996   right   Cataract    cataracts removed   Chronic uveitis of right eye    Colon polyps    Corneal edema of right eye    Cystoid macular edema of right eye    Depressive disorder    DVT (deep venous thrombosis) (HCC)    Family history of colon cancer    Glaucoma    not on eye drops at this time.   Hypertension    Hypertensive retinopathy of both eyes    Olecranon bursitis    Osteoarthritis    Other disorders of bone and cartilage(733.99)    Personal history of chemotherapy    1996   Personal history of radiation therapy    1996 & 2011   Pseudoexfoliation (PXF) glaucoma of both eyes    Pseudophakia, both eyes    Seasonal allergies    Unspecified pruritic disorder    Past Surgical History:  Procedure Laterality Date   APPENDECTOMY  1958   BREAST LUMPECTOMY Right 1996   Dr. Channing Blush in [place]   BREAST LUMPECTOMY Left 2011   Dr. Olam Angle   cataract Bilateral    CATARACT EXTRACTION Bilateral    Dr. Meridee   COLONOSCOPY  2011-last   DUPUYTREN CONTRACTURE RELEASE     ESOPHAGOGASTRODUODENOSCOPY (EGD) WITH PROPOFOL  N/A 02/10/2020   Procedure: ESOPHAGOGASTRODUODENOSCOPY (EGD) WITH PROPOFOL ;  Surgeon: Teressa Toribio SQUIBB, MD;  Location: [facility];  Service: Endoscopy;  Laterality: N/A;   FINE NEEDLE ASPIRATION N/A 02/10/2020   Procedure: FINE NEEDLE ASPIRATION (FNA)  LINEAR;  Surgeon: Teressa Toribio SQUIBB, MD;  Location: [facility];  Service: Endoscopy;  Laterality: N/A;   HAND SURGERY Right 2020   LASIK Bilateral 2007   POLYPECTOMY     ROTATOR CUFF REPAIR  2007   TONSILLECTOMY  1967   UPPER ESOPHAGEAL ENDOSCOPIC ULTRASOUND (EUS) N/A 02/10/2020   Procedure: UPPER ESOPHAGEAL ENDOSCOPIC ULTRASOUND (EUS);  Surgeon: Teressa Toribio SQUIBB, MD;  Location: [facility];  Service: Endoscopy;  Laterality: N/A;   Family History  Problem Relation Age of Onset   Stroke Father 18   Heart disease Brother    Colon cancer Maternal Aunt 57   Macular degeneration Mother    Cancer Maternal Grandmother        unknown type   Colon polyps Neg Hx    Esophageal cancer Neg Hx    Rectal cancer Neg Hx    Stomach cancer Neg Hx  Social History   Socioeconomic History   Marital status: Married    Spouse name: Not on file   Number of children: 0   Years of education: Not on file   Highest education level: Bachelor's degree (e.g., BA, AB, BS)  Occupational History   Not on file  Tobacco Use   Smoking status: Never   Smokeless tobacco: Never  Vaping Use   Vaping status: Never Used  Substance and Sexual Activity   Alcohol use: Yes    Alcohol/week: 6.0 standard drinks of alcohol    Types: 6 Glasses of wine per week    Comment: daily   Drug use: No   Sexual activity: Yes  Other Topics Concern   Not on file  Social History Narrative   Married no children   Wine 3 to 4 glasses a day   Never smoker   No drug use   Social Drivers of Corporate investment banker Strain: Low Risk  (06/15/2024)   Overall Financial Resource Strain (CARDIA)    Difficulty of Paying Living Expenses: Not hard at all  Food Insecurity: No Food Insecurity (06/15/2024)   Hunger Vital Sign    Worried About Running Out of Food in the Last Year: Never true    Ran Out of Food in the Last Year: Never true  Transportation Needs: No Transportation Needs (06/15/2024)   PRAPARE - Doctor, general practice (Medical): No    Lack of Transportation (Non-Medical): No  Physical Activity: Inactive (06/15/2024)   Exercise Vital Sign    Days of Exercise per Week: 0 days    Minutes of Exercise per Session: 0 min  Stress: No Stress Concern Present (06/15/2024)   [employer] of Occupational Health - Occupational Stress Questionnaire    Feeling of Stress: Only a little  Social Connections: Moderately Integrated (06/15/2024)   Social Connection and Isolation Panel    Frequency of Communication with Friends and Family: Three times a week    Frequency of Social Gatherings with Friends and Family: Twice a week    Attends Religious Services: Never    Database administrator or Organizations: Yes    Attends Banker Meetings: Never    Marital Status: Married    Tobacco Counseling Counseling given: Not Answered    Clinical Intake:  Pre-visit preparation completed: Yes  Pain : No/denies pain     BMI - recorded: 19.49 Nutritional Status: BMI of 19-24  Normal Nutritional Risks: None Diabetes: No  No results found for: HGBA1C   How often do you need to have someone help you when you read instructions, pamphlets, or other written materials from your doctor or pharmacy?: 1 - Never  Interpreter Needed?: No  Information entered by :: Verdie Saba, CMA   Activities of Daily Living     06/15/2024    8:16 AM  In your present state of health, do you have any difficulty performing the following activities:  Hearing? 0  Vision? 0  Difficulty concentrating or making decisions? 0  Walking or climbing stairs? 0  Dressing or bathing? 0  Doing errands, shopping? 0  Preparing Food and eating ? N  Using the Toilet? N  In the past six months, have you accidently leaked urine? N  Do you have problems with loss of bowel control? N  Managing your Medications? N  Managing your Finances? N  Housekeeping or managing your Housekeeping? N    Patient Care  Team: Rollene Norris  A, MD as PCP - General (Internal Medicine) Odean Potts, MD as Consulting Physician (Hematology and Oncology) Avram Lupita BRAVO, MD as Consulting Physician (Gastroenterology) Cloretta Arley NOVAK, MD as Consulting Physician (Oncology) Fleeta Zerita DASEN, MD as Consulting Physician (Ophthalmology)  I have updated your Care Teams any recent Medical Services you may have received from other providers in the past year.     Assessment:   This is a routine wellness examination for Jamille.  Hearing/Vision screen Hearing Screening - Comments:: Denies hearing difficulties   Vision Screening - Comments:: Wears rx glasses - up to date with routine eye exams with Dr Fleeta of [facility]   Goals Addressed               This Visit's Progress     Patient Stated (pt-stated)        Patient stated she plans to restart exercising       Depression Screen     06/15/2024    8:17 AM 10/09/2023    8:31 AM 07/01/2023    3:48 PM 06/12/2023    9:05 AM 05/15/2023    9:36 AM 10/25/2022    9:13 AM 08/19/2022    3:33 PM  PHQ 2/9 Scores  PHQ - 2 Score 0 0 0 0 0 0 0  PHQ- 9 Score 0 0  0 0 0     Fall Risk     06/15/2024    8:16 AM 10/09/2023    8:31 AM 08/25/2023   10:05 AM 07/01/2023    3:48 PM 06/12/2023    9:15 AM  Fall Risk   Falls in the past year? 0 0 0 1 1  Number falls in past yr: 0 0 0 0 0  Injury with Fall? 0 0 0 0 0  Risk for fall due to : No Fall Risks   No Fall Risks   Follow up Falls evaluation completed;Falls prevention discussed Falls evaluation completed Falls evaluation completed Falls evaluation completed Falls evaluation completed;Education provided    MEDICARE RISK AT HOME:  Medicare Risk at Home Any stairs in or around the home?: Yes (inside/outside) If so, are there any without handrails?: No Home free of loose throw rugs in walkways, pet beds, electrical cords, etc?: Yes Adequate lighting in your home to reduce risk of falls?: Yes Life alert?: No Use of  a cane, walker or w/c?: No Grab bars in the bathroom?: No Shower chair or bench in shower?: Yes Elevated toilet seat or a handicapped toilet?: No  TIMED UP AND GO:  Was the test performed?  No  Cognitive Function: 6CIT completed        06/15/2024    8:20 AM 06/12/2023    9:16 AM 04/03/2022   10:25 AM  6CIT Screen  What Year? 0 points 0 points 0 points  What month? 0 points 0 points 0 points  What time? 0 points 0 points 0 points  Count back from 20 0 points 0 points 0 points  Months in reverse 0 points 0 points 0 points  Repeat phrase 0 points 0 points 0 points  Total Score 0 points 0 points 0 points    Immunizations Immunization History  Administered Date(s) Administered   Fluad Quad(high Dose 65+) 08/12/2021, 08/11/2022   Fluad Trivalent(High Dose 65+) 08/01/2023   Influenza Split 08/20/2020   Influenza, High Dose Seasonal PF 08/09/2016, 09/09/2017, 09/07/2018, 08/01/2019   Influenza,inj,Quad PF,6+ Mos 08/10/2015   Influenza-Unspecified 08/10/2015, 08/01/2019, 02/18/2020, 08/20/2020   Moderna Sars-Covid-2  Vaccination 09/22/2021   PFIZER(Purple Top)SARS-COV-2 Vaccination 12/05/2019, 12/26/2019, 08/03/2020   Pneumococcal Conjugate-13 12/13/2014   Pneumococcal Polysaccharide-23 06/16/2013   Respiratory Syncytial Virus Vaccine,Recomb Aduvanted(Arexvy) 08/11/2022   Tdap 11/25/1998, 08/10/2015   Unspecified SARS-COV-2 Vaccination 09/12/2023   Zoster Recombinant(Shingrix) 06/22/2017, 09/10/2017   Zoster, Live 05/27/2012    Screening Tests Health Maintenance  Topic Date Due   Colonoscopy  03/13/2023   COVID-19 Vaccine (6 - Pfizer risk 2024-25 season) 03/12/2024   Hepatitis C Screening  08/16/2026 (Originally 01/25/1964)   INFLUENZA VACCINE  06/25/2024   Medicare Annual Wellness (AWV)  06/15/2025   DTaP/Tdap/Td (3 - Td or Tdap) 08/09/2025   Pneumococcal Vaccine: 50+ Years  Completed   DEXA SCAN  Completed   Zoster Vaccines- Shingrix  Completed   Hepatitis B Vaccines   Aged Out   HPV VACCINES  Aged Out   Meningococcal B Vaccine  Aged Out    Health Maintenance  Health Maintenance Due  Topic Date Due   Colonoscopy  03/13/2023   COVID-19 Vaccine (6 - Pfizer risk 2024-25 season) 03/12/2024   Health Maintenance Items Addressed:  Referral sent to GI for colonoscopy - w/Dr Avram (h/o colon polyps)   Additional Screening:  Vision Screening: Recommended annual ophthalmology exams for early detection of glaucoma and other disorders of the eye. Would you like a referral to an eye doctor? No  Patient stated has had an eye exam w/Dr Fleeta of [facility] in 2025.  Dental Screening: Recommended annual dental exams for proper oral hygiene  Community Resource Referral / Chronic Care Management: CRR required this visit?  No   CCM required this visit?  No   Plan:    I have personally reviewed and noted the following in the patient's chart:   Medical and social history Use of alcohol, tobacco or illicit drugs  Current medications and supplements including opioid prescriptions. Patient is not currently taking opioid prescriptions. Functional ability and status Nutritional status Physical activity Advanced directives List of other physicians Hospitalizations, surgeries, and ER visits in previous 12 months Vitals Screenings to include cognitive, depression, and falls Referrals and appointments  In addition, I have reviewed and discussed with patient certain preventive protocols, quality metrics, and best practice recommendations. A written personalized care plan for preventive services as well as general preventive health recommendations were provided to patient.   Verdie CHRISTELLA Saba, CMA   06/15/2024   After Visit Summary: (MyChart) Due to this being a telephonic visit, the after visit summary with patients personalized plan was offered to patient via MyChart   Notes: A CPE appt will be scheduled for the pt w/PCP.

## 2024-06-15 NOTE — Patient Instructions (Signed)
 Jo Morrison , Thank you for taking time out of your busy schedule to complete your Annual Wellness Visit with me. I enjoyed our conversation and look forward to speaking with you again next year. I, as well as your care team,  appreciate your ongoing commitment to your health goals. Please review the following plan we discussed and let me know if I can assist you in the future. Your Game plan/ To Do List    Referrals: If you haven't heard from the office you've been referred to, please reach out to them at the phone provided.  Referral to Dr Avram (GI) for a repeat Colonoscopy Follow up Visits: Next Medicare AWV with our clinical staff: 06/17/2025   Have you seen your provider in the last 6 months (3 months if uncontrolled diabetes)? Yes Next Office Visit with your provider: to be scheduled  Clinician Recommendations:  Aim for 30 minutes of exercise or brisk walking, 6-8 glasses of water, and 5 servings of fruits and vegetables each day.       This is a list of the screening recommended for you and due dates:  Health Maintenance  Topic Date Due   Colon Cancer Screening  03/13/2023   COVID-19 Vaccine (6 - Pfizer risk 2024-25 season) 03/12/2024   Hepatitis C Screening  08/16/2026*   Flu Shot  06/25/2024   Medicare Annual Wellness Visit  06/15/2025   DTaP/Tdap/Td vaccine (3 - Td or Tdap) 08/09/2025   Pneumococcal Vaccine for age over 87  Completed   DEXA scan (bone density measurement)  Completed   Zoster (Shingles) Vaccine  Completed   Hepatitis B Vaccine  Aged Out   HPV Vaccine  Aged Out   Meningitis B Vaccine  Aged Out  *Topic was postponed. The date shown is not the original due date.    Advanced directives: (In Chart) A copy of your advanced directives are scanned into your chart should your provider ever need it. Advance Care Planning is important because it:  [x]  Makes sure you receive the medical care that is consistent with your values, goals, and preferences  [x]  It  provides guidance to your family and loved ones and reduces their decisional burden about whether or not they are making the right decisions based on your wishes.  Follow the link provided in your after visit summary or read over the paperwork we have mailed to you to help you started getting your Advance Directives in place. If you need assistance in completing these, please reach out to us  so that we can help you!

## 2024-06-16 ENCOUNTER — Other Ambulatory Visit: Payer: Self-pay

## 2024-06-21 DIAGNOSIS — J4520 Mild intermittent asthma, uncomplicated: Secondary | ICD-10-CM | POA: Diagnosis not present

## 2024-06-21 DIAGNOSIS — C158 Malignant neoplasm of overlapping sites of esophagus: Secondary | ICD-10-CM | POA: Diagnosis not present

## 2024-06-21 DIAGNOSIS — N3289 Other specified disorders of bladder: Secondary | ICD-10-CM | POA: Diagnosis not present

## 2024-06-21 DIAGNOSIS — Z01818 Encounter for other preprocedural examination: Secondary | ICD-10-CM | POA: Diagnosis not present

## 2024-06-21 DIAGNOSIS — I82C12 Acute embolism and thrombosis of left internal jugular vein: Secondary | ICD-10-CM | POA: Diagnosis not present

## 2024-06-21 DIAGNOSIS — J90 Pleural effusion, not elsewhere classified: Secondary | ICD-10-CM | POA: Diagnosis not present

## 2024-06-21 DIAGNOSIS — Z853 Personal history of malignant neoplasm of breast: Secondary | ICD-10-CM | POA: Diagnosis not present

## 2024-06-21 DIAGNOSIS — Z8501 Personal history of malignant neoplasm of esophagus: Secondary | ICD-10-CM | POA: Diagnosis not present

## 2024-06-21 DIAGNOSIS — Z86711 Personal history of pulmonary embolism: Secondary | ICD-10-CM | POA: Diagnosis not present

## 2024-06-22 DIAGNOSIS — Z08 Encounter for follow-up examination after completed treatment for malignant neoplasm: Secondary | ICD-10-CM | POA: Diagnosis not present

## 2024-06-22 DIAGNOSIS — K2270 Barrett's esophagus without dysplasia: Secondary | ICD-10-CM | POA: Diagnosis not present

## 2024-06-22 DIAGNOSIS — K2289 Other specified disease of esophagus: Secondary | ICD-10-CM | POA: Diagnosis not present

## 2024-06-22 DIAGNOSIS — Z9221 Personal history of antineoplastic chemotherapy: Secondary | ICD-10-CM | POA: Diagnosis not present

## 2024-06-22 DIAGNOSIS — K222 Esophageal obstruction: Secondary | ICD-10-CM | POA: Diagnosis not present

## 2024-06-22 DIAGNOSIS — Z79899 Other long term (current) drug therapy: Secondary | ICD-10-CM | POA: Diagnosis not present

## 2024-06-22 DIAGNOSIS — Z853 Personal history of malignant neoplasm of breast: Secondary | ICD-10-CM | POA: Diagnosis not present

## 2024-06-22 DIAGNOSIS — Z8501 Personal history of malignant neoplasm of esophagus: Secondary | ICD-10-CM | POA: Diagnosis not present

## 2024-06-22 DIAGNOSIS — J449 Chronic obstructive pulmonary disease, unspecified: Secondary | ICD-10-CM | POA: Diagnosis not present

## 2024-06-22 DIAGNOSIS — Z923 Personal history of irradiation: Secondary | ICD-10-CM | POA: Diagnosis not present

## 2024-06-22 DIAGNOSIS — J4520 Mild intermittent asthma, uncomplicated: Secondary | ICD-10-CM | POA: Diagnosis not present

## 2024-06-22 DIAGNOSIS — Z86711 Personal history of pulmonary embolism: Secondary | ICD-10-CM | POA: Diagnosis not present

## 2024-06-22 DIAGNOSIS — I10 Essential (primary) hypertension: Secondary | ICD-10-CM | POA: Diagnosis not present

## 2024-06-25 ENCOUNTER — Encounter: Payer: Self-pay | Admitting: Gastroenterology

## 2024-06-28 ENCOUNTER — Inpatient Hospital Stay
Admission: RE | Admit: 2024-06-28 | Discharge: 2024-06-28 | Disposition: A | Discharge status: Home / Self Care | Payer: Self-pay | Source: Ambulatory Visit | Attending: Oncology | Admitting: Oncology

## 2024-06-28 ENCOUNTER — Other Ambulatory Visit: Payer: Self-pay | Admitting: *Deleted

## 2024-06-28 DIAGNOSIS — C158 Malignant neoplasm of overlapping sites of esophagus: Secondary | ICD-10-CM

## 2024-06-28 NOTE — Progress Notes (Signed)
 Received copy of CT scans from [facility] dated 06/21/24. Faxed copy of reports and requested images be uploaded for MD visit on 07/05/24 to [facility].

## 2024-06-29 ENCOUNTER — Encounter: Payer: Self-pay | Admitting: Oncology

## 2024-07-05 ENCOUNTER — Inpatient Hospital Stay
Admission: RE | Admit: 2024-07-05 | Discharge: 2024-07-05 | Disposition: A | Discharge status: Home / Self Care | Payer: Self-pay | Source: Ambulatory Visit | Attending: Oncology | Admitting: Oncology

## 2024-07-05 ENCOUNTER — Inpatient Hospital Stay: Payer: Medicare (Managed Care) | Attending: Oncology | Admitting: Oncology

## 2024-07-05 ENCOUNTER — Other Ambulatory Visit: Payer: Self-pay | Admitting: *Deleted

## 2024-07-05 VITALS — BP 108/79 | HR 99 | Temp 97.8°F | Resp 18 | Ht 63.0 in | Wt 108.7 lb

## 2024-07-05 DIAGNOSIS — I10 Essential (primary) hypertension: Secondary | ICD-10-CM | POA: Insufficient documentation

## 2024-07-05 DIAGNOSIS — Z8501 Personal history of malignant neoplasm of esophagus: Secondary | ICD-10-CM

## 2024-07-05 DIAGNOSIS — Z86711 Personal history of pulmonary embolism: Secondary | ICD-10-CM | POA: Insufficient documentation

## 2024-07-05 DIAGNOSIS — F32A Depression, unspecified: Secondary | ICD-10-CM | POA: Insufficient documentation

## 2024-07-05 DIAGNOSIS — C158 Malignant neoplasm of overlapping sites of esophagus: Secondary | ICD-10-CM | POA: Diagnosis not present

## 2024-07-05 DIAGNOSIS — H1820 Unspecified corneal edema: Secondary | ICD-10-CM | POA: Insufficient documentation

## 2024-07-05 DIAGNOSIS — H209 Unspecified iridocyclitis: Secondary | ICD-10-CM | POA: Insufficient documentation

## 2024-07-05 DIAGNOSIS — Z86000 Personal history of in-situ neoplasm of breast: Secondary | ICD-10-CM | POA: Diagnosis not present

## 2024-07-05 DIAGNOSIS — J45909 Unspecified asthma, uncomplicated: Secondary | ICD-10-CM | POA: Diagnosis not present

## 2024-07-05 DIAGNOSIS — Z923 Personal history of irradiation: Secondary | ICD-10-CM | POA: Insufficient documentation

## 2024-07-05 DIAGNOSIS — Z9221 Personal history of antineoplastic chemotherapy: Secondary | ICD-10-CM | POA: Diagnosis not present

## 2024-07-05 NOTE — Progress Notes (Signed)
 [facility] OFFICE PROGRESS NOTE   Diagnosis: Esophagus cancer  INTERVAL HISTORY:   Jo Morrison returns for a scheduled visit.  She reports feeling well.  No dysphagia.  She is exercising.  No neck or arm swelling.  She underwent a restaging endoscopy by Dr. Valli on 06/22/2024.  A stricture was noted at 14 cm.  Multiple biopsies were obtained.  The biopsy at 32 cm revealed squamous mucosa with epithelial atypia and leukoplakia/epidermoid metaplasia.  She reports a 17-month follow-up endoscopy is planned. Restaging CT has revealed a mild decrease in the size of a right supraclavicular node with similar nonocclusive thrombus in the distal left internal jugular vein extending to the sigmoid sinus.  The CT abdomen/pelvis and chest revealed no evidence of progressive disease.  Objective:  Vital signs in last 24 hours:  Blood pressure 108/79, pulse 99, [name], temperature source Temporal, resp. rate 18, height 5' 3 (1.6 m), weight 108 lb 11.2 oz (49.3 kg), SpO2 100%.    HEENT: Neck without mass Lymphatics: No cervical, supraclavicular, axillary, or inguinal nodes Resp: Lungs clear bilaterally, no respiratory distress Cardio: Regular rate and rhythm GI: No hepatosplenomegaly Vascular: No leg edema   Lab Results:  Lab Results  Component Value Date   WBC 10.3 07/29/2023   HGB 13.7 07/29/2023   HCT 40.9 07/29/2023   MCV 98.1 07/29/2023   PLT 284 07/29/2023   NEUTROABS 7.9 (H) 07/29/2023    CMP  Lab Results  Component Value Date   NA 142 07/29/2023   K 4.7 07/29/2023   CL 102 07/29/2023   CO2 33 (H) 07/29/2023   GLUCOSE 114 (H) 07/29/2023   BUN 20 07/29/2023   CREATININE 1.15 (H) 07/29/2023   CALCIUM 9.4 07/29/2023   PROT 7.1 07/29/2023   ALBUMIN 4.1 07/29/2023   AST 16 07/29/2023   ALT 9 07/29/2023   ALKPHOS 58 07/29/2023   BILITOT 0.4 07/29/2023   GFRNONAA 49 (L) 07/29/2023   GFRAA >60 02/21/2020    No results found for: CEA1,  CEA, CAN199, CA125  No results found for: INR, LABPROT  Imaging:  No results found.  Medications: I have reviewed the patient's current medications.   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  For hypermetabolic metastatic lymph nodes, 3 in the mediastinum and 1 in the upper abdomen gastrohepatic ligament.  EUS 02/10/2020-ulcerated mass in the mid esophagus,uT3, 3 suspicious lymph nodes near the primary tumor, malignant appearing lymph node at the gastrohepatic ligament (level 18)-metastatic squamous cell carcinoma Radiation 01/31/2020 Cycle 1 weekly Taxol /carboplatin  02/01/2020 Cycle 2 weekly Taxol /carboplatin  02/08/2020 Cycle 3 weekly Taxol /carboplatin  02/15/2020 Cycle 4 weekly Taxol /carboplatin  02/22/2020 Cycle 5 weekly Taxol /carboplatin  02/29/2020 Radiation completed 03/08/2020 CTs 04/03/2020-esophageal mass appears grossly smaller, regression of previously noted lymphadenopathy in the mediastinum and upper abdomen, nonocclusive pulmonary embolus right-sided pulmonary arteries Upper endoscopy 05/02/2020-treatment effect visible starting approximately 26 cm from the incisors.  Scope passed easily into the stomach and proximal duodenum.  Stomach and proximal duodenum grossly normal.  Scope withdrawn to the mid esophagus with four-quadrant biopsies obtained.  2 biopsy showed acute ulcerative esophagitis; 2 biopsies showed squamous  mucosa with minimal histologic abnormality. CTs 07/24/2020-no evidence of  metastatic disease, left gastric lymph node has decreased in size, mild wall thickening of the mid to distal esophagus Upper endoscopy 07/25/2020-multiple biopsies with changes of ulcerative esophagitis, negative fungal, HSV, and CMV stains, no evidence of malignancy CT[facility] at [facility] 11/27/2020-interval decrease in prior mediastinal groundglass opacities; decrease in now mild diffuse esophageal wall thickening, most prominent in the mid and distal esophagus; few scattered 3 mm and smaller noncalcified pulmonary nodules not significantly changed.  No definite new or enlarging pulmonary nodule.  No evidence of metastatic disease in the abdomen. EGD 11/28/2020-mild and discrete stricture in the distal esophagus; no mass lesions seen.  Biopsies taken, negative for malignancy x4; serial dilatation performed. CTs at [facility] 08/06/2021-no evidence of recurrent disease EGD 08/07/2021-no mass, mild stricture, multiple biopsies negative for carcinoma EGD at [facility] 12/25/2021-mild stricture, no mass, biopsies negative for carcinoma, 1 biopsy with possible low-grade dysplasia CTs 12/24/2021-negative for recurrent disease CTs at [facility] 07/01/2022-no evidence of recurrent disease Endoscopy 07/02/2022-negative for malignancy Progressive dysphagia/odynophagia status post EGD 12/17/2022-near complete obstruction UES, biopsy-moderately differentiated squamous cell carcinoma; PD-L1 CPS 45  PET scan at [facility] 12/18/2022-FDG avidity in the cervical esophagus.  No FDG avid metastatic disease. Radiation 01/02/2023-02/10/2023 Cycle 1 weekly Taxol /carboplatin  01/02/2023 Cycle 2 weekly Taxol /carboplatin  01/09/2023 Cycle 3 weekly Taxol /carboplatin  01/16/2023 Cycle 4 weekly Taxol /carboplatin  01/23/2023 Cycle 5 weekly Taxol /carboplatin  01/30/2023 CT neck 03/31/2023-resolution of upper esophagus mass, no evidence of metastatic disease CT chest 03/31/2023-similar wall thickening of the mid to distal esophagus, stable small bilateral pulmonary nodules, able small  pericardial effusion, new small nonspecific bilateral pleural effusions Upper endoscopy 04/01/2023-multiple esophagus biopsies-esophagitis at 28 cm and 30 cm CTs 07/14/2023-no esophagus mass, no lymphadenopathy, mild diffuse esophageal wall thickening, unchanged bilateral pulmonary nodules, unchanged small bilateral pleural effusions and pericardial effusion Upper endoscopy 07/15/2023-stricture at 14 cm, remainder of the esophagus and stomach appear normal, biopsies from 14, 16, 28, 30, and 32 cm-negative for malignancy, proximal esophagus dilated Upper endoscopy 11/04/2023-multiple biopsies negative for carcinoma, squamous mucosa with focal leukoplakia/epidermoid metaplasia at 30 cm CTs 11/03/2023: Increased right greater than left pleural effusions, change small bilateral pulmonary nodules, stable distal esophageal wall thickening, stable pericardial effusion Upper endoscopy 02/17/2024-stricture at 14 cm.  Multiple biopsies were obtained, all negative for dysplasia or malignancy. CTs 02/09/2024-mild interval enlargement of a right supraclavicular lymph node measuring 6 mm, previously 3 mm; new clustered airway centered nodules in the dependent lungs with an approximately 1 cm more discrete nodule versus nodular focus of consolidation at the right lung base.  No metastatic disease in the abdomen. CT 06/21/2024: Less conspicuous pulmonary nodules consistent with a resolving inflammatory process, no new nodules, minimal increased small right and trace left pleural effusions, unchanged small pericardial effusion, similar nonocclusive thrombus in the distal left IJ extending to the sigmoid sinus Upper endoscopy 06/22/2024: Stricture at 14 cm, multiple biopsies obtained-biopsy at 32 cm with squamous epithelial atypia and leukoplakia/epidermoid metaplasia   Right breast cancer July 1996, [name], [name], right lumpectomy and axillary dissection followed by adjuvant Adriamycin/Taxol  chemotherapy-4  cycles, 5 years of tamoxifen , and breast radiation   3.   Left breast DCIS January 2011 -1.1 cm, [name], [name], left breast lumpectomy followed by adjuvant radiation and 5 years of tamoxifen    4.  Depression   5.  Hypertension   6.  Right eye uveitis, macular edema, and corneal edema   7.  Asthma   8.  Pulmonary embolus on CT 04/03/2020-lovenox   initiated, anticoagulation changed to apixaban  beginning 08/03/2020, anticoagulation completed approximately the beginning of December 2021   9.  Odynophagia secondary to radiation -resolved    Disposition: Jo Morrison is in clinical remission esophagus cancer.  She will continue endoscopic and CT surveillance with Dr. Valli.  A stable nonocclusive thrombus in the left IJ was noted on the CT 06/21/2024.  This is most likely a chronic thrombus.  She does not have symptoms suggestive of an acute DVT.  I recommend observation.  We will obtain the CT images from [facility] to review with interventional radiology here. Jo Morrison will return for an office visit in 3 months.  Arley Hof, MD  07/05/2024  10:55 AM

## 2024-07-20 ENCOUNTER — Encounter: Payer: Self-pay | Admitting: *Deleted

## 2024-07-20 NOTE — Progress Notes (Signed)
 07/05/24: Faxed request to [facility] requesting upload of [facility] CT image scans of 02/09/24 and 11/03/2023. 8/19 and 8/20: LMV for [employer] to f/u--never returned call. 07/16/24: Faxed request to [facility] file room  07/20/24: Called and left VM w/[facility] file room asking for update on status of images being power shared.

## 2024-07-23 ENCOUNTER — Encounter: Payer: Self-pay | Admitting: *Deleted

## 2024-07-30 DIAGNOSIS — H2011 Chronic iridocyclitis, right eye: Secondary | ICD-10-CM | POA: Diagnosis not present

## 2024-07-30 DIAGNOSIS — H401414 Capsular glaucoma with pseudoexfoliation of lens, right eye, indeterminate stage: Secondary | ICD-10-CM | POA: Diagnosis not present

## 2024-07-30 DIAGNOSIS — Z947 Corneal transplant status: Secondary | ICD-10-CM | POA: Diagnosis not present

## 2024-07-30 DIAGNOSIS — H1820 Unspecified corneal edema: Secondary | ICD-10-CM | POA: Diagnosis not present

## 2024-08-03 ENCOUNTER — Other Ambulatory Visit: Payer: Self-pay | Admitting: *Deleted

## 2024-08-03 ENCOUNTER — Inpatient Hospital Stay
Admission: RE | Admit: 2024-08-03 | Discharge: 2024-08-03 | Disposition: A | Discharge status: Home / Self Care | Payer: Self-pay | Source: Ambulatory Visit | Attending: Oncology | Admitting: Oncology

## 2024-08-03 DIAGNOSIS — Z8501 Personal history of malignant neoplasm of esophagus: Secondary | ICD-10-CM

## 2024-08-03 NOTE — Progress Notes (Signed)
 MD requesting CT images from 06/21/24 CT neck, abd, pelvis from [facility] to be uploaded. Faxed request to [facility] imaging and placed outside CT images orders in [facility].

## 2024-08-23 NOTE — Progress Notes (Unsigned)
 Jo Morrison [id number] September 23, 1946   Chief Complaint: Discuss colonoscopy  Referring Provider: Rollene Almarie Morrison, * Primary GI MD: Jo Morrison  HPI: Jo Morrison is a 78 y.o. female with past medical history of anxiety/depression, asthma, right breast cancer 1996 s/p lumpectomy, left breast cancer 2012 s/p lumpectomy, esophageal cancer s/p chemo and radiation, DVT, HTN, family history of colon cancer who presents today to discuss colonoscopy.    Last seen in office 01/11/2020 by Jo Morrison for odynophagia.  CT of chest, abdomen, and pelvis had been unrevealing.  She underwent upper endoscopy 01/14/2020 with finding of a partially obstructing esophageal tumor which on biopsy was found to be invasive moderately differentiated squamous cell carcinoma.  She underwent upper EUS 02/10/2020 and on lymph node FNA found to have malignant cells consistent with metastatic squamous cell carcinoma.  Has since been following with oncology and has undergone multiple rounds of chemo and radiation, esophageal cancer now in clinical remission.  Most recent upper endoscopy 06/22/2024 with finding of a stricture at 14 cm, multiple biopsies obtained, biopsy at 32 cm with squamous epithelial atypia and leukoplakia/epidermoid metaplasia.  She will continue endoscopic and CT surveillance with Jo Morrison at [facility].  Colonoscopy 03/12/2018 with finding of a tubular adenoma and 5-year recall recommended.   Patient denies concerns today, overall feeling well.  She is unsure whether she wants to have another colonoscopy.  States she is due to have a repeat EGD at the end of October, and wants to wait to see if she has a recurrence of esophageal cancer before considering a repeat colonoscopy.  She has family history of colon cancer in her maternal aunt though is unsure what age her aunt was diagnosed.  She denies unintentional weight loss, fever, chills, decreased appetite, abdominal pain.  She does have ongoing  dysphagia but states she is able to continue eating and drinking as she normally would.  Denies change in bowel habits, constipation, diarrhea, blood in her stool, melena.  Having regular bowel movements without difficulty.  Denies any history of MI/stroke.  Denies any shortness of breath or chest pain. Denies blood thinners.  States she has not been told she has a difficult intubation, and I do not see mention of difficult intubation noted in her anesthesia postprocedure notes.  Recent CT abdomen/pelvis 06/21/2024 showed no evidence of metastatic disease in the abdomen or pelvis.  Most recent EGD 06/22/2024 with finding of an esophageal stricture, and biopsies obtained with path showing squamous atypia of 1 sample with differential including reactive atypia and low-grade squamous dysplasia.  Close follow-up recommended.  Previous GI Procedures/Imaging   Most recent EGD 06/22/2024 OPERATIVE REPORT:  Smooth induction of general endotracheal anesthesia. The gastroscope was introduced through the oropharynx and into the proximal esophagus. There is a stricture approximately 14 cm from the incisors and intubation of the proximal esophagus is somewhat challenging. However, the scope ultimately traverses the structure. The remainder of the esophagus, stomach and proximal duodenum are grossly normal.  Four-quadrant biopsies were then obtained from the distal location, 28-, 30- and 32 cm from the incisors. These were sent for permanent pathology. Next, additional four-quadrant biopsies were obtained from the esophagus at 14- and 16 cm from the incisors.  The stomach is then decompressed and the esophagus inspected for bleeding. Hemostasis is good. The scope is then withdrawn and the procedure completed.  Path: A. Esophagus, 32 cm, biopsy: Squamous mucosa with epithelial atypia and leukoplakia/epidermoid metaplasia, see comment.  B. Esophagus, 30 cm,  biopsy: Squamous mucosa with no significant  pathologic change.  C. Esophagus, 18 cm, biopsy: Squamous mucosa with no significant pathologic change.  D. Esophagus, 16 cm, biopsy: Squamous mucosa with no significant pathologic change.  E. Esophagus, 14 cm, biopsy: Squamous mucosa with no significant pathologic change.  Comment: There is squamous atypia seen in part A. The differential diagnosis includes reactive atypia and low-grade squamous dysplasia. No high-grade dysplasia or carcinoma is seen. Also present is epidermoid metaplasia which could increase the risk of dysplasia or neoplasia. Close follow-up is recommended.      Upper EUS 02/10/2020 - Squamous cell cancer of the mid esophagus with suspicious local ( paraesophageal) adenopathy. PET avid gastrohepatic ligament LN was located and sampled by transgastric EUS guided FNA. Preliminary cytology was positive for metastatic squamous cell to the gastrohepatic lymphnode. Path: FINAL MICROSCOPIC DIAGNOSIS:  - Malignant cells consistent with metastatic squamous cell carcinoma   EGD 01/14/2020 - Partially obstructing, likely malignant esophageal tumor was found in the proximal esophagus and in the mid esophagus. Biopsied.  - The examination was otherwise normal. Path: Esophagus, biopsy, esophageal mass proximal and mid (26-31cm) - INVASIVE MODERATELY DIFFERENTIATED SQUAMOUS CELL CARCINOMA.  Colonoscopy 03/12/2018 - Decreased sphincter tone found on digital rectal exam.  - One 6 mm polyp in the descending colon, removed with a cold snare. Resected and retrieved.  - Two 1 mm polyps in the cecum, removed with a cold biopsy forceps. Resected and retrieved.  - Diverticulosis in the sigmoid colon.  - The examination was otherwise normal on direct and retroflexion views.  - Personal history of colonic polyps. Adenomas 2014 and polyps before ([place] ) - Recall 5 years Path: Surgical [P], cecum, descending, polyp (3) - TUBULAR ADENOMA (ONE FRAGMENT). - BENIGN COLONIC MUCOSA (TWO  FRAGMENTS). - NO HIGH GRADE DYSPLASIA OR MALIGNANCY.  Past Medical History:  Diagnosis Date   Allergy    Anxiety    Arthritis    Asthma    Breast cancer (HCC) 2012   left   Breast cancer, right (HCC) 1996   right   Cataract    cataracts removed   Chronic uveitis of right eye    Colon polyps    Corneal edema of right eye    Cystoid macular edema of right eye    Depressive disorder    DVT (deep venous thrombosis) (HCC)    Esophageal cancer (HCC)    Family history of colon cancer    Glaucoma    not on eye drops at this time.   Hypertension    Hypertensive retinopathy of both eyes    Olecranon bursitis    Osteoarthritis    Other disorders of bone and cartilage(733.99)    Personal history of chemotherapy    1996   Personal history of radiation therapy    1996 & 2011   Pseudoexfoliation (PXF) glaucoma of both eyes    Pseudophakia, both eyes    Seasonal allergies    Status post dilation of esophageal narrowing    Unspecified pruritic disorder     Past Surgical History:  Procedure Laterality Date   APPENDECTOMY  1958   BREAST LUMPECTOMY Right 1996   Dr. Channing Blush in [place]   BREAST LUMPECTOMY Left 2011   Dr. Olam Angle   cataract Bilateral    CATARACT EXTRACTION Bilateral    Dr. Meridee   COLONOSCOPY  2011-last   DUPUYTREN CONTRACTURE RELEASE     ESOPHAGOGASTRODUODENOSCOPY (EGD) WITH PROPOFOL  N/A 02/10/2020   Procedure: ESOPHAGOGASTRODUODENOSCOPY (EGD) WITH PROPOFOL ;  Surgeon: Teressa Toribio SQUIBB, MD;  Location: [facility];  Service: Endoscopy;  Laterality: N/A;   FINE NEEDLE ASPIRATION N/A 02/10/2020   Procedure: FINE NEEDLE ASPIRATION (FNA) LINEAR;  Surgeon: Teressa Toribio SQUIBB, MD;  Location: [facility];  Service: Endoscopy;  Laterality: N/A;   HAND SURGERY Right 2020   LASIK Bilateral 2007   POLYPECTOMY     ROTATOR CUFF REPAIR  2007   TONSILLECTOMY  1967   UPPER ESOPHAGEAL ENDOSCOPIC ULTRASOUND (EUS) N/A 02/10/2020   Procedure: UPPER ESOPHAGEAL ENDOSCOPIC  ULTRASOUND (EUS);  Surgeon: Teressa Toribio SQUIBB, MD;  Location: [facility];  Service: Endoscopy;  Laterality: N/A;    Current Outpatient Medications  Medication Sig Dispense Refill   clonazePAM  (KLONOPIN ) 0.5 MG tablet Take 0.5-1 tablets (0.25-0.5 mg total) by mouth 2 (two) times daily as needed. 30 tablet 0   fluticasone  (FLONASE) 50 MCG/ACT nasal spray Place 2 sprays into the nose daily as needed for allergies.     fluticasone  furoate-vilanterol (BREO ELLIPTA ) 200-25 MCG/ACT AEPB Inhale 1 puff into the lungs daily. 3 each 3   hydrochlorothiazide  (HYDRODIURIL ) 25 MG tablet Take 1 tablet (25 mg total) by mouth daily. 90 tablet 3   montelukast  (SINGULAIR ) 10 MG tablet TAKE 1 TABLET(10 MG) BY MOUTH AT BEDTIME 90 tablet 3   REFRESH OPTIVE ADVANCED 0.5-1-0.5 % SOLN Place 1 drop into both eyes daily as needed (itching Eyes).     traZODone  (DESYREL ) 100 MG tablet TAKE 1 TO 2 TABLETS(100 TO 200 MG) BY MOUTH AT BEDTIME AS NEEDED FOR SLEEP 180 tablet 3   venlafaxine  XR (EFFEXOR -XR) 150 MG 24 hr capsule TAKE 1 CAPSULE(150 MG) BY MOUTH DAILY 90 capsule 3   No current facility-administered medications for this visit.    Allergies as of 08/24/2024 - Review Complete 08/24/2024  Allergen Reaction Noted   Latex Shortness Of Breath     Family History  Problem Relation Age of Onset   Macular degeneration Mother    Stroke Father 51   Heart disease Brother    Cancer Maternal Grandmother        unknown type   Colon cancer Maternal Aunt 65   Colon polyps Neg Hx    Esophageal cancer Neg Hx    Rectal cancer Neg Hx    Stomach cancer Neg Hx     Social History   Tobacco Use   Smoking status: Never   Smokeless tobacco: Never  Vaping Use   Vaping status: Never Used  Substance Use Topics   Alcohol use: Yes    Alcohol/week: 6.0 standard drinks of alcohol    Types: 6 Glasses of wine per week    Comment: 2 per day   Drug use: No     Review of Systems:    Constitutional: No weight loss, fever,  chills Cardiovascular: No chest pain Respiratory: No SOB Gastrointestinal: See HPI and otherwise negative Hematologic: No bleeding or bruising   Physical Exam:  Vital signs: BP 132/80 (BP Location: Left Arm, Patient Position: Sitting, Cuff Size: Normal)   Pulse 84   Ht 5' 2 (1.575 m) Comment: height measured without shoes  Wt 109 lb 8 oz (49.7 kg)   BMI 20.03 kg/m   Constitutional: Pleasant female in NAD, alert and cooperative Head:  Normocephalic and atraumatic.  Eyes: No scleral icterus.  Respiratory: Respirations even and unlabored. Lungs clear to auscultation bilaterally.  No wheezes, crackles, or rhonchi.  Cardiovascular:  Regular rate and rhythm. No murmurs. No peripheral edema. Gastrointestinal:  Soft, nondistended, nontender.  No rebound or guarding. Normal bowel sounds. No appreciable masses or hepatomegaly. Rectal:  Not performed.  Neurologic:  Alert and oriented x4;  grossly normal neurologically.  Skin:   Dry and intact without significant lesions or rashes. Psychiatric: Oriented to person, place and time. Demonstrates good judgement and reason without abnormal affect or behaviors.   RELEVANT LABS AND IMAGING: CBC    Component Value Date/Time   WBC 10.3 07/29/2023 0845   WBC 11.3 (H) 11/29/2019 1514   RBC 4.17 07/29/2023 0845   HGB 13.7 07/29/2023 0845   HGB 13.6 11/06/2015 1039   HCT 40.9 07/29/2023 0845   HCT 41.6 11/06/2015 1039   PLT 284 07/29/2023 0845   PLT 272 11/06/2015 1039   MCV 98.1 07/29/2023 0845   MCV 89.4 11/06/2015 1039   MCH 32.9 07/29/2023 0845   MCHC 33.5 07/29/2023 0845   RDW 13.9 07/29/2023 0845   RDW 13.6 11/06/2015 1039   LYMPHSABS 1.2 07/29/2023 0845   LYMPHSABS 1.4 11/06/2015 1039   MONOABS 0.9 07/29/2023 0845   MONOABS 0.7 11/06/2015 1039   EOSABS 0.2 07/29/2023 0845   EOSABS 0.2 11/06/2015 1039   BASOSABS 0.1 07/29/2023 0845   BASOSABS 0.1 11/06/2015 1039    CMP     Component Value Date/Time   NA 142 07/29/2023 0845    NA 140 11/06/2015 1039   K 4.7 07/29/2023 0845   K 3.4 (L) 11/06/2015 1039   CL 102 07/29/2023 0845   CL 101 03/01/2013 1359   CO2 33 (H) 07/29/2023 0845   CO2 31 (H) 11/06/2015 1039   GLUCOSE 114 (H) 07/29/2023 0845   GLUCOSE 97 11/06/2015 1039   GLUCOSE 96 03/01/2013 1359   BUN 20 07/29/2023 0845   BUN 17.0 11/06/2015 1039   CREATININE 1.15 (H) 07/29/2023 0845   CREATININE 0.8 11/06/2015 1039   CALCIUM 9.4 07/29/2023 0845   CALCIUM 9.2 11/06/2015 1039   PROT 7.1 07/29/2023 0845   PROT 6.8 11/06/2015 1039   ALBUMIN 4.1 07/29/2023 0845   ALBUMIN 3.6 11/06/2015 1039   AST 16 07/29/2023 0845   AST 17 11/06/2015 1039   ALT 9 07/29/2023 0845   ALT 12 11/06/2015 1039   ALKPHOS 58 07/29/2023 0845   ALKPHOS 56 11/06/2015 1039   BILITOT 0.4 07/29/2023 0845   BILITOT 0.56 11/06/2015 1039   GFRNONAA 49 (L) 07/29/2023 0845   GFRAA >60 02/21/2020 0942     Assessment/Plan:   History of adenomatous colon polyps Family history of colon cancer History of breast cancer History of esophageal cancer Dysphagia Patient seen today to discuss colonoscopy.  She has history of bilateral breast cancer as well as esophageal cancer which was diagnosed in 2021.  She was treated with chemo and radiation, and did have a recurrence of cancer requiring retreatment but is currently in clinical remission.  Follows with [facility].  She underwent EGD most recently 06/22/2024 with finding of an esophageal stricture, and biopsies obtained with path showing squamous atypia of 1 sample with differential including reactive atypia and low-grade squamous dysplasia.  Close follow-up was recommended and she is scheduled for repeat EGD at the end of October.  Has some ongoing dysphagia, but otherwise denies any concerns today.  She has regular bowel movements and denies any abdominal pain, rectal pain, bleeding, change in bowel habits.  Does have history of adenomatous colon polyps and family history of colon cancer in a  maternal aunt, age unknown. Last colonoscopy 03/12/2018 with finding of a tubular adenoma and 5-year  recall recommended.  Patient is unsure whether or not she wants to have a repeat colonoscopy.  Her primary concern at this time is upcoming EGD to see whether or not she has a recurrence of esophageal cancer, and if she does, she is unsure whether or not she would want to proceed with colonoscopy.  We discussed risks of procedure, as well as risks associated with not repeating a colonoscopy to include missing a colon cancer or colon polyp.  She acknowledges these risks and agrees to follow-up appointment to revisit colonoscopy after her upcoming EGD.  - Follow up 6-8 weeks with Jo Morrison to further discuss repeat colonoscopy   Camie Furbish, PA-C  Gastroenterology 08/24/2024, 10:01 AM  Patient Care Team: Jo Almarie LABOR, MD as PCP - General (Internal Medicine) Odean Potts, MD as Consulting Physician (Hematology and Oncology) Morrison Lupita BRAVO, MD as Consulting Physician (Gastroenterology) Cloretta Arley NOVAK, MD as Consulting Physician (Oncology) Fleeta Zerita DASEN, MD as Consulting Physician (Ophthalmology)

## 2024-08-24 ENCOUNTER — Encounter: Payer: Self-pay | Admitting: Gastroenterology

## 2024-08-24 ENCOUNTER — Ambulatory Visit: Payer: Medicare (Managed Care) | Admitting: Gastroenterology

## 2024-08-24 VITALS — BP 132/80 | HR 84 | Ht 62.0 in | Wt 109.5 lb

## 2024-08-24 DIAGNOSIS — Z8501 Personal history of malignant neoplasm of esophagus: Secondary | ICD-10-CM

## 2024-08-24 DIAGNOSIS — Z860101 Personal history of adenomatous and serrated colon polyps: Secondary | ICD-10-CM | POA: Diagnosis not present

## 2024-08-24 DIAGNOSIS — R1310 Dysphagia, unspecified: Secondary | ICD-10-CM | POA: Diagnosis not present

## 2024-08-24 DIAGNOSIS — C158 Malignant neoplasm of overlapping sites of esophagus: Secondary | ICD-10-CM

## 2024-08-24 DIAGNOSIS — Z853 Personal history of malignant neoplasm of breast: Secondary | ICD-10-CM | POA: Diagnosis not present

## 2024-08-24 DIAGNOSIS — Z800 Family history of malignant neoplasm of digestive organs: Secondary | ICD-10-CM

## 2024-08-24 NOTE — Patient Instructions (Signed)
 Follow up with Dr Avram in 6-8 weeks.   _______________________________________________________  If your blood pressure at your visit was 140/90 or greater, please contact your primary care physician to follow up on this.  _______________________________________________________  If you are age 78 or older, your body mass index should be between 23-30. Your Body mass index is 20.03 kg/m. If this is out of the aforementioned range listed, please consider follow up with your Primary Care Provider.  If you are age 52 or younger, your body mass index should be between 19-25. Your Body mass index is 20.03 kg/m. If this is out of the aformentioned range listed, please consider follow up with your Primary Care Provider.   ________________________________________________________  The [facility] providers would like to encourage you to use MYCHART to communicate with providers for non-urgent requests or questions.  Due to long hold times on the telephone, sending your provider a message by [facility] may be a faster and more efficient way to get a response.  Please allow 48 business hours for a response.  Please remember that this is for non-urgent requests.  _______________________________________________________  [facility] is using a team-based approach to care.  Your team is made up of your doctor and two to three APPS. Our APPS (Nurse Practitioners and Physician Assistants) work with your physician to ensure care continuity for you. They are fully qualified to address your health concerns and develop a treatment plan. They communicate directly with your gastroenterologist to care for you. Seeing the Advanced Practice Practitioners on your physician's team can help you by facilitating care more promptly, often allowing for earlier appointments, access to diagnostic testing, procedures, and other specialty referrals.

## 2024-09-20 DIAGNOSIS — I10 Essential (primary) hypertension: Secondary | ICD-10-CM | POA: Diagnosis not present

## 2024-09-20 DIAGNOSIS — Z923 Personal history of irradiation: Secondary | ICD-10-CM | POA: Diagnosis not present

## 2024-09-20 DIAGNOSIS — Z9221 Personal history of antineoplastic chemotherapy: Secondary | ICD-10-CM | POA: Diagnosis not present

## 2024-09-20 DIAGNOSIS — I3139 Other pericardial effusion (noninflammatory): Secondary | ICD-10-CM | POA: Diagnosis not present

## 2024-09-20 DIAGNOSIS — J479 Bronchiectasis, uncomplicated: Secondary | ICD-10-CM | POA: Diagnosis not present

## 2024-09-20 DIAGNOSIS — Z86711 Personal history of pulmonary embolism: Secondary | ICD-10-CM | POA: Diagnosis not present

## 2024-09-20 DIAGNOSIS — Z7901 Long term (current) use of anticoagulants: Secondary | ICD-10-CM | POA: Diagnosis not present

## 2024-09-20 DIAGNOSIS — J90 Pleural effusion, not elsewhere classified: Secondary | ICD-10-CM | POA: Diagnosis not present

## 2024-09-20 DIAGNOSIS — Z853 Personal history of malignant neoplasm of breast: Secondary | ICD-10-CM | POA: Diagnosis not present

## 2024-09-20 DIAGNOSIS — Z08 Encounter for follow-up examination after completed treatment for malignant neoplasm: Secondary | ICD-10-CM | POA: Diagnosis not present

## 2024-09-20 DIAGNOSIS — G08 Intracranial and intraspinal phlebitis and thrombophlebitis: Secondary | ICD-10-CM | POA: Diagnosis not present

## 2024-09-20 DIAGNOSIS — Z01818 Encounter for other preprocedural examination: Secondary | ICD-10-CM | POA: Diagnosis not present

## 2024-09-20 DIAGNOSIS — Z8501 Personal history of malignant neoplasm of esophagus: Secondary | ICD-10-CM | POA: Diagnosis not present

## 2024-09-20 DIAGNOSIS — R9431 Abnormal electrocardiogram [ECG] [EKG]: Secondary | ICD-10-CM | POA: Diagnosis not present

## 2024-09-20 DIAGNOSIS — C159 Malignant neoplasm of esophagus, unspecified: Secondary | ICD-10-CM | POA: Diagnosis not present

## 2024-09-21 DIAGNOSIS — I10 Essential (primary) hypertension: Secondary | ICD-10-CM | POA: Diagnosis not present

## 2024-09-21 DIAGNOSIS — K222 Esophageal obstruction: Secondary | ICD-10-CM | POA: Diagnosis not present

## 2024-09-21 DIAGNOSIS — Z79899 Other long term (current) drug therapy: Secondary | ICD-10-CM | POA: Diagnosis not present

## 2024-09-21 DIAGNOSIS — Z7901 Long term (current) use of anticoagulants: Secondary | ICD-10-CM | POA: Diagnosis not present

## 2024-09-21 DIAGNOSIS — Z8501 Personal history of malignant neoplasm of esophagus: Secondary | ICD-10-CM | POA: Diagnosis not present

## 2024-09-21 DIAGNOSIS — Z86718 Personal history of other venous thrombosis and embolism: Secondary | ICD-10-CM | POA: Diagnosis not present

## 2024-09-21 DIAGNOSIS — Z08 Encounter for follow-up examination after completed treatment for malignant neoplasm: Secondary | ICD-10-CM | POA: Diagnosis not present

## 2024-09-21 DIAGNOSIS — Z9221 Personal history of antineoplastic chemotherapy: Secondary | ICD-10-CM | POA: Diagnosis not present

## 2024-09-21 DIAGNOSIS — J45909 Unspecified asthma, uncomplicated: Secondary | ICD-10-CM | POA: Diagnosis not present

## 2024-09-21 DIAGNOSIS — Z853 Personal history of malignant neoplasm of breast: Secondary | ICD-10-CM | POA: Diagnosis not present

## 2024-09-21 DIAGNOSIS — Z86711 Personal history of pulmonary embolism: Secondary | ICD-10-CM | POA: Diagnosis not present

## 2024-10-04 ENCOUNTER — Inpatient Hospital Stay: Payer: Medicare (Managed Care) | Admitting: Oncology

## 2024-10-08 ENCOUNTER — Ambulatory Visit: Admitting: Physician Assistant

## 2024-10-11 DIAGNOSIS — Z961 Presence of intraocular lens: Secondary | ICD-10-CM | POA: Diagnosis not present

## 2024-10-11 DIAGNOSIS — Z947 Corneal transplant status: Secondary | ICD-10-CM | POA: Diagnosis not present

## 2024-10-11 DIAGNOSIS — H401434 Capsular glaucoma with pseudoexfoliation of lens, bilateral, indeterminate stage: Secondary | ICD-10-CM | POA: Diagnosis not present

## 2024-10-11 DIAGNOSIS — Z9689 Presence of other specified functional implants: Secondary | ICD-10-CM | POA: Diagnosis not present

## 2024-10-11 DIAGNOSIS — H353132 Nonexudative age-related macular degeneration, bilateral, intermediate dry stage: Secondary | ICD-10-CM | POA: Diagnosis not present

## 2024-10-11 DIAGNOSIS — H1820 Unspecified corneal edema: Secondary | ICD-10-CM | POA: Diagnosis not present

## 2024-10-28 ENCOUNTER — Ambulatory Visit: Payer: Medicare (Managed Care) | Admitting: Internal Medicine

## 2024-10-28 ENCOUNTER — Encounter: Payer: Self-pay | Admitting: Internal Medicine

## 2024-10-28 VITALS — BP 120/70 | HR 80 | Ht 62.0 in | Wt 113.0 lb

## 2024-10-28 DIAGNOSIS — Z860101 Personal history of adenomatous and serrated colon polyps: Secondary | ICD-10-CM | POA: Diagnosis not present

## 2024-10-28 DIAGNOSIS — F1090 Alcohol use, unspecified, uncomplicated: Secondary | ICD-10-CM

## 2024-10-28 DIAGNOSIS — C158 Malignant neoplasm of overlapping sites of esophagus: Secondary | ICD-10-CM

## 2024-10-28 NOTE — Progress Notes (Signed)
 Jo Morrison 78 y.o. [birth year] [id number]  Assessment & Plan:   Encounter Diagnoses  Name Primary?   Hx of adenomatous colonic polyps Yes   Primary squamous cell carcinoma of overlapping sites of esophagus [facility])    We reviewed the pros and cons of proceeding with a routine repeat colonoscopy.  The patient has elected not to pursue that and I support that decision.  She was advised to seek help if she develops change in bowel habits rectal bleeding significant abdominal pain or has other gastrointestinal signs or symptoms.  She does not have evidence of disease with respect to the squamous cell carcinoma, is not having dysphagia.  Subjective:   Chief Complaint: History of colon polyps, question repeat colonoscopy  HPI 78 year old woman with a history of squamous cell carcinoma of the esophagus diagnosed in 2021, here to discuss a surveillance colonoscopy.  She has a personal history of colon polyps with adenomas in 2014 and prior, and in 2019 I removed 3 polyps between 1 and 6 mm, pathology was 1 adenoma and 2 mucosal polyps.  She saw Camie Furbish PA-C in our clinic in September and was awaiting the results of a follow-up EGD before deciding on repeating a colonoscopy.  She had that EGD in October and there was no evidence of cancer.  Denies dysphagia.   She is not having any bowel habit changes rectal bleeding significant abdominal pain.  Imaging studies over the last couple of years have included whole-body PET scanning in 2024, CT of the abdomen and pelvis later in 2024 with no colonic abnormality related to neoplasia.  I have reviewed PET scan and CT abdomen pelvis results in care everywhere.  I have reviewed her 2025 EGD reports and biopsies from esophagus as well as oncology records. Allergies  Allergen Reactions   Latex Shortness Of Breath   Current Meds  Medication Sig   clonazePAM  (KLONOPIN ) 0.5 MG tablet Take 0.5-1 tablets (0.25-0.5 mg total) by mouth 2 (two) times daily as  needed.   fluticasone  (FLONASE) 50 MCG/ACT nasal spray Place 2 sprays into the nose daily as needed for allergies.   fluticasone  furoate-vilanterol (BREO ELLIPTA ) 200-25 MCG/ACT AEPB Inhale 1 puff into the lungs daily.   hydrochlorothiazide  (HYDRODIURIL ) 25 MG tablet Take 1 tablet (25 mg total) by mouth daily.   montelukast  (SINGULAIR ) 10 MG tablet TAKE 1 TABLET(10 MG) BY MOUTH AT BEDTIME   REFRESH OPTIVE ADVANCED 0.5-1-0.5 % SOLN Place 1 drop into both eyes daily as needed (itching Eyes).   traZODone  (DESYREL ) 100 MG tablet TAKE 1 TO 2 TABLETS(100 TO 200 MG) BY MOUTH AT BEDTIME AS NEEDED FOR SLEEP   venlafaxine  XR (EFFEXOR -XR) 150 MG 24 hr capsule TAKE 1 CAPSULE(150 MG) BY MOUTH DAILY   Past Medical History:  Diagnosis Date   Allergy    Anxiety    Arthritis    Asthma    Breast cancer (HCC) 2012   left   Breast cancer, right (HCC) 1996   right   Cataract    cataracts removed   Chronic uveitis of right eye    Colon polyps    Corneal edema of right eye    Cystoid macular edema of right eye    Depressive disorder    DVT (deep venous thrombosis) (HCC)    Esophageal cancer (HCC)    Family history of colon cancer    Glaucoma    not on eye drops at this time.   Hypertension    Hypertensive retinopathy of  both eyes    Olecranon bursitis    Osteoarthritis    Other disorders of bone and cartilage(733.99)    Personal history of chemotherapy    1996   Personal history of radiation therapy    1996 & 2011   Pseudoexfoliation (PXF) glaucoma of both eyes    Pseudophakia, both eyes    Seasonal allergies    Status post dilation of esophageal narrowing    Unspecified pruritic disorder    Past Surgical History:  Procedure Laterality Date   APPENDECTOMY  1958   BREAST LUMPECTOMY Right 1996   Dr. Channing Blush in [place]   BREAST LUMPECTOMY Left 2011   Dr. Olam Angle   cataract Bilateral    CATARACT EXTRACTION Bilateral    Dr. Meridee   COLONOSCOPY  2011-last   DUPUYTREN  CONTRACTURE RELEASE     ESOPHAGOGASTRODUODENOSCOPY (EGD) WITH PROPOFOL  N/A 02/10/2020   Procedure: ESOPHAGOGASTRODUODENOSCOPY (EGD) WITH PROPOFOL ;  Surgeon: Teressa Toribio SQUIBB, MD;  Location: [facility];  Service: Endoscopy;  Laterality: N/A;   FINE NEEDLE ASPIRATION N/A 02/10/2020   Procedure: FINE NEEDLE ASPIRATION (FNA) LINEAR;  Surgeon: Teressa Toribio SQUIBB, MD;  Location: [facility];  Service: Endoscopy;  Laterality: N/A;   HAND SURGERY Right 2020   LASIK Bilateral 2007   POLYPECTOMY     ROTATOR CUFF REPAIR  2007   TONSILLECTOMY  1967   UPPER ESOPHAGEAL ENDOSCOPIC ULTRASOUND (EUS) N/A 02/10/2020   Procedure: UPPER ESOPHAGEAL ENDOSCOPIC ULTRASOUND (EUS);  Surgeon: Teressa Toribio SQUIBB, MD;  Location: [facility];  Service: Endoscopy;  Laterality: N/A;   Social History   Social History Narrative   Married no children   Wine 3 to 4 glasses a day   Never smoker   No drug use   family history includes Cancer in her maternal grandmother; Colon cancer (age of onset: 1) in her maternal aunt; Heart disease in her brother; Macular degeneration in her mother; Stroke (age of onset: 36) in her father.   Review of Systems  As per HPI Objective:   Physical Exam BP 120/70   Pulse 80   Ht 5' 2 (1.575 m)   Wt 113 lb (51.3 kg)   BMI 20.67 kg/m    I spent 23 minutes of time, including in depth chart review, independent review of results as outlined above, communicating results with the patient directly, face-to-face time with the patient, coordinating care, ordering studies and medications as appropriate, and documentation.

## 2024-10-28 NOTE — Patient Instructions (Signed)
 Glad to see you today.  We decided not to repeat a routine colonoscopy.  Should you develop bowel habit changes, rectal bleeding, abdominal pain problems please return for evaluation.  I appreciate the opportunity to care for you. Jo CHARLENA Commander, MD, NOLIA

## 2024-11-01 DIAGNOSIS — H2011 Chronic iridocyclitis, right eye: Secondary | ICD-10-CM | POA: Diagnosis not present

## 2024-11-01 DIAGNOSIS — H353132 Nonexudative age-related macular degeneration, bilateral, intermediate dry stage: Secondary | ICD-10-CM | POA: Diagnosis not present

## 2024-11-01 DIAGNOSIS — H401434 Capsular glaucoma with pseudoexfoliation of lens, bilateral, indeterminate stage: Secondary | ICD-10-CM | POA: Diagnosis not present

## 2024-11-01 DIAGNOSIS — Z947 Corneal transplant status: Secondary | ICD-10-CM | POA: Diagnosis not present

## 2024-11-01 DIAGNOSIS — Z9689 Presence of other specified functional implants: Secondary | ICD-10-CM | POA: Diagnosis not present

## 2024-11-02 ENCOUNTER — Ambulatory Visit: Payer: Self-pay

## 2024-11-02 NOTE — Telephone Encounter (Signed)
 FYI Only or Action Required?: Action required by provider: update on patient condition.  Patient was last seen in primary care on 10/09/2023 by Rollene Almarie LABOR, MD.  Called Nurse Triage reporting Urinary Frequency.  Symptoms began several weeks ago.  Interventions attempted: Rest, hydration, or home remedies.  Symptoms are: gradually worsening.  Triage Disposition: See Physician Within 24 Hours  Patient/caregiver understands and will follow disposition?: Yes Copied from CRM #[id number]. Topic: Clinical - Red Word Triage >> Nov 02, 2024  5:11 PM Drema MATSU wrote: Red Word that prompted transfer to Nurse Triage: Patient has frequent urination, less strength in legs(less than normal), and is experiencing low potassium. Reason for Disposition  Urinating more frequently than usual (i.e., frequency) OR new-onset of the feeling of an urgent need to urinate (i.e., urgency)  Answer Assessment - Initial Assessment Questions Extra thirsty lately. Having leg weakness and low potassium (based off the way she was feeling when she had low potassium in the past during chemo) Takes hydrochlorothiazide  and was on potassium supplement in the past but stopped when she did not need anymore.  Generalized lower extremity weakness- denies unilateral weakness, numbness tingling. Occurred with low potassium as well Appt with pcp office 12/11 to assess. ED precautions understood. Husband appt at 4pm and asking if provider can see them both at 120pm time slot.   1. SYMPTOM: What's the main symptom you're concerned about? (e.g., frequency, incontinence)     Urinary frequency- does feel like she is emptying completely 2. ONSET: When did the  frequency  start?     2-3 weeks  3. PAIN: Is there any pain? If Yes, ask: How bad is it? (Scale: 1-10; mild, moderate, severe)     denies 4. CAUSE: What do you think is causing the symptoms?     unsure 5. OTHER SYMPTOMS: Do you have any other symptoms? (e.g.,  blood in urine, fever, flank pain, pain with urination)     denies  Protocols used: Urinary Symptoms-A-AH

## 2024-11-03 NOTE — Telephone Encounter (Signed)
 Please advise

## 2024-11-03 NOTE — Telephone Encounter (Signed)
 Called patient and back and LVM letting her know that she can keep the appointment as is and if we are not as busy then maybe we can squeeze her in

## 2024-11-03 NOTE — Telephone Encounter (Signed)
 Keep the appointments as is.  Depends on how busy we are.

## 2024-11-04 ENCOUNTER — Ambulatory Visit: Payer: Medicare (Managed Care) | Admitting: Emergency Medicine

## 2024-11-04 ENCOUNTER — Other Ambulatory Visit: Payer: Self-pay | Admitting: Internal Medicine

## 2024-11-04 ENCOUNTER — Encounter: Payer: Self-pay | Admitting: Emergency Medicine

## 2024-11-04 VITALS — BP 122/80 | HR 98 | Temp 98.2°F | Ht 62.0 in | Wt 109.0 lb

## 2024-11-04 DIAGNOSIS — R350 Frequency of micturition: Secondary | ICD-10-CM | POA: Diagnosis not present

## 2024-11-04 LAB — COMPREHENSIVE METABOLIC PANEL WITH GFR
ALT: 7 U/L (ref 0–35)
AST: 19 U/L (ref 0–37)
Albumin: 4.5 g/dL (ref 3.5–5.2)
Alkaline Phosphatase: 87 U/L (ref 39–117)
BUN: 13 mg/dL (ref 6–23)
CO2: 34 meq/L — ABNORMAL HIGH (ref 19–32)
Calcium: 9.7 mg/dL (ref 8.4–10.5)
Chloride: 96 meq/L (ref 96–112)
Creatinine, Ser: 0.88 mg/dL (ref 0.40–1.20)
GFR: 62.79 mL/min (ref >60.00)
Glucose, Bld: 178 mg/dL — ABNORMAL HIGH (ref 70–99)
Potassium: 4.1 meq/L (ref 3.5–5.1)
Sodium: 138 meq/L (ref 135–145)
Total Bilirubin: 0.7 mg/dL (ref 0.2–1.2)
Total Protein: 7.5 g/dL (ref 6.0–8.3)

## 2024-11-04 LAB — CBC WITH DIFFERENTIAL/PLATELET
Basophils Absolute: 0.1 K/uL (ref 0.0–0.1)
Basophils Relative: 0.7 % (ref 0.0–3.0)
Eosinophils Absolute: 0.1 K/uL (ref 0.0–0.7)
Eosinophils Relative: 1.2 % (ref 0.0–5.0)
HCT: 41.4 % (ref 36.0–46.0)
Hemoglobin: 13.8 g/dL (ref 12.0–15.0)
Lymphocytes Relative: 9.1 % — ABNORMAL LOW (ref 12.0–46.0)
Lymphs Abs: 0.8 K/uL (ref 0.7–4.0)
MCHC: 33.3 g/dL (ref 30.0–36.0)
MCV: 96.4 fl (ref 78.0–100.0)
Monocytes Absolute: 0.7 K/uL (ref 0.1–1.0)
Monocytes Relative: 8.1 % (ref 3.0–12.0)
Neutro Abs: 7.2 K/uL (ref 1.4–7.7)
Neutrophils Relative %: 80.9 % — ABNORMAL HIGH (ref 43.0–77.0)
Platelets: 295 K/uL (ref 150.0–400.0)
RBC: 4.29 Mil/uL (ref 3.87–5.11)
RDW: 15.4 % (ref 11.5–15.5)
WBC: 8.9 K/uL (ref 4.0–10.5)

## 2024-11-04 NOTE — Assessment & Plan Note (Signed)
 Clinical stable.  No red flag signs or symptoms. Differential diagnosis discussed Patient states recent MRI showing thickening of of urinary bladder This finding warrants urological evaluation Needs referral to urology Patient states it does not feel like infection.  Recommend urinalysis and urine culture Has been thiazide diuretic for which also contributes to urinary frequency No systemic symptoms.  Afebrile.  Unremarkable exam. Needs follow-up with neurologist and PCP. Also recommend blood work today.  GFR last October was 50 Highest creatinine 1.1 Advised to contact the office if no better or worse during the next several days Advised to stay well-hydrated

## 2024-11-04 NOTE — Progress Notes (Signed)
 Jo Morrison 78 y.o.   Chief Complaint  Patient presents with   Urinary Frequency    Pt states that she has been having this for about 3-4 weeks and she states that she does not believe it is a UTI due to no burning or itching symtoms    Extremity Weakness    In the legs this has been going on for about 6 weeks    HISTORY OF PRESENT ILLNESS: Acute problem visit today.  Patient of Dr. Almarie Cleveland This is a 78 y.o. female complaining of urinary frequency for 2 weeks Denies burning with urination.  Denies hematuria.  States it does not feel like an infection History of hypertension on hydrochlorothiazide  Patient has history of esophageal cancer getting treatment with [facility] States recent MRI showed thickened wall of urinary bladder Denies fever or chills.  Denies nausea or vomiting. No other complaints or medical concerns today.  Urinary Frequency  Associated symptoms include frequency. Pertinent negatives include no chills, hematuria, nausea or vomiting.  Extremity Weakness  Pertinent negatives include no fever.     Prior to Admission medications  Medication Sig Start Date End Date Taking? Authorizing Provider  clonazePAM  (KLONOPIN ) 0.5 MG tablet Take 0.5-1 tablets (0.25-0.5 mg total) by mouth 2 (two) times daily as needed. 04/07/24  Yes Hurst, Verneita DASEN, PA-C  fluticasone  (FLONASE) 50 MCG/ACT nasal spray Place 2 sprays into the nose daily as needed for allergies. 07/20/18  Yes [provider]  fluticasone  furoate-vilanterol (BREO ELLIPTA ) 200-25 MCG/ACT AEPB Inhale 1 puff into the lungs daily. 08/25/23  Yes Cleveland Almarie DELENA, MD  hydrochlorothiazide  (HYDRODIURIL ) 25 MG tablet Take 1 tablet (25 mg total) by mouth daily. 08/25/23  Yes Cleveland Almarie DELENA, MD  montelukast  (SINGULAIR ) 10 MG tablet TAKE 1 TABLET(10 MG) BY MOUTH AT BEDTIME 06/01/24  Yes Cleveland Almarie DELENA, MD  REFRESH OPTIVE ADVANCED 0.5-1-0.5 % SOLN Place 1 drop into both eyes  daily as needed (itching Eyes). 11/11/19  Yes [provider]  traZODone  (DESYREL ) 100 MG tablet TAKE 1 TO 2 TABLETS(100 TO 200 MG) BY MOUTH AT BEDTIME AS NEEDED FOR SLEEP 03/01/24  Yes Hurst, Teresa T, PA-C  venlafaxine  XR (EFFEXOR -XR) 150 MG 24 hr capsule TAKE 1 CAPSULE(150 MG) BY MOUTH DAILY 03/01/24  Yes Rhys Verneita DASEN, PA-C    Allergies[1]  Patient Active Problem List   Diagnosis Date Noted   CAP (community acquired pneumonia) 10/09/2023   Genetic testing 02/29/2020   Primary squamous cell carcinoma of overlapping sites of esophagus (HCC) 01/19/2020   Routine general medical examination at a health care facility 08/16/2016   Ductal carcinoma in situ (DCIS) of left breast 10/28/2014   Contracture of palmar fascia (Dupuytren's) 07/13/2014   Hypertension    Anxiety    Asthma    Osteoarthritis    Glaucoma    Hx of adenomatous colonic polyps 11/01/2012   Breast cancer of lower-outer quadrant of right female breast (HCC) 10/12/2012    Past Medical History:  Diagnosis Date   Allergy    Anxiety    Arthritis    Asthma    Breast cancer (HCC) 2012   left   Breast cancer, right (HCC) 1996   right   Cataract    cataracts removed   Chronic uveitis of right eye    Colon polyps    Corneal edema of right eye    Cystoid macular edema of right eye    Depressive disorder    DVT (deep venous  thrombosis) (HCC)    Esophageal cancer (HCC)    Family history of colon cancer    Glaucoma    not on eye drops at this time.   Hypertension    Hypertensive retinopathy of both eyes    Olecranon bursitis    Osteoarthritis    Other disorders of bone and cartilage(733.99)    Personal history of chemotherapy    1996   Personal history of radiation therapy    1996 & 2011   Pseudoexfoliation (PXF) glaucoma of both eyes    Pseudophakia, both eyes    Seasonal allergies    Status post dilation of esophageal narrowing    Unspecified pruritic disorder     Past Surgical History:   Procedure Laterality Date   APPENDECTOMY  1958   BREAST LUMPECTOMY Right 1996   Dr. Channing Blush in [place]   BREAST LUMPECTOMY Left 2011   Dr. Olam Angle   cataract Bilateral    CATARACT EXTRACTION Bilateral    Dr. Meridee   COLONOSCOPY  2011-last   DUPUYTREN CONTRACTURE RELEASE     ESOPHAGOGASTRODUODENOSCOPY (EGD) WITH PROPOFOL  N/A 02/10/2020   Procedure: ESOPHAGOGASTRODUODENOSCOPY (EGD) WITH PROPOFOL ;  Surgeon: Teressa Toribio SQUIBB, MD;  Location: [facility];  Service: Endoscopy;  Laterality: N/A;   FINE NEEDLE ASPIRATION N/A 02/10/2020   Procedure: FINE NEEDLE ASPIRATION (FNA) LINEAR;  Surgeon: Teressa Toribio SQUIBB, MD;  Location: [facility];  Service: Endoscopy;  Laterality: N/A;   HAND SURGERY Right 2020   LASIK Bilateral 2007   POLYPECTOMY     ROTATOR CUFF REPAIR  2007   TONSILLECTOMY  1967   UPPER ESOPHAGEAL ENDOSCOPIC ULTRASOUND (EUS) N/A 02/10/2020   Procedure: UPPER ESOPHAGEAL ENDOSCOPIC ULTRASOUND (EUS);  Surgeon: Teressa Toribio SQUIBB, MD;  Location: [facility];  Service: Endoscopy;  Laterality: N/A;    Social History   Socioeconomic History   Marital status: Married    Spouse name: Not on file   Number of children: 0   Years of education: Not on file   Highest education level: Bachelor's degree (e.g., BA, AB, BS)  Occupational History   Occupation: retired  Tobacco Use   Smoking status: Never   Smokeless tobacco: Never  Vaping Use   Vaping status: Never Used  Substance and Sexual Activity   Alcohol use: Yes    Alcohol/week: 6.0 standard drinks of alcohol    Types: 6 Glasses of wine per week    Comment: 2 per day   Drug use: No   Sexual activity: Yes  Other Topics Concern   Not on file  Social History Narrative   Married no children   Wine 3 to 4 glasses a day   Never smoker   No drug use   Social Drivers of Health   Tobacco Use: Low Risk (11/04/2024)   Patient History    Smoking Tobacco Use: Never    Smokeless Tobacco Use: Never    Passive Exposure:  Not on file  Financial Resource Strain: Low Risk (06/15/2024)   Overall Financial Resource Strain (CARDIA)    Difficulty of Paying Living Expenses: Not hard at all  Food Insecurity: No Food Insecurity (06/15/2024)   Epic    Worried About Radiation Protection Practitioner of Food in the Last Year: Never true    Ran Out of Food in the Last Year: Never true  Transportation Needs: No Transportation Needs (06/15/2024)   Epic    Lack of Transportation (Medical): No    Lack of Transportation (Non-Medical): No  Physical Activity: Inactive (  06/15/2024)   Exercise Vital Sign    Days of Exercise per Week: 0 days    Minutes of Exercise per Session: 0 min  Stress: No Stress Concern Present (06/15/2024)   [employer] of Occupational Health - Occupational Stress Questionnaire    Feeling of Stress: Only a little  Social Connections: Moderately Integrated (06/15/2024)   Social Connection and Isolation Panel    Frequency of Communication with Friends and Family: Three times a week    Frequency of Social Gatherings with Friends and Family: Twice a week    Attends Religious Services: Never    Database Administrator or Organizations: Yes    Attends Banker Meetings: Never    Marital Status: Married  Catering Manager Violence: Not At Risk (06/15/2024)   Epic    Fear of Current or Ex-Partner: No    Emotionally Abused: No    Physically Abused: No    Sexually Abused: No  Depression (PHQ2-9): Low Risk (07/05/2024)   Depression (PHQ2-9)    PHQ-2 Score: 0  Alcohol Screen: Low Risk (06/15/2024)   Alcohol Screen    Last Alcohol Screening Score (AUDIT): 4  Recent Concern: Alcohol Screen - Medium Risk (06/13/2024)   Alcohol Screen    Last Alcohol Screening Score (AUDIT): 8  Housing: Low Risk (06/15/2024)   Epic    Unable to Pay for Housing in the Last Year: No    Number of Times Moved in the Last Year: 0    Homeless in the Last Year: No  Utilities: Not At Risk (06/15/2024)   Epic    Threatened with loss of  utilities: No  Health Literacy: Adequate Health Literacy (06/15/2024)   B1300 Health Literacy    Frequency of need for help with medical instructions: Never    Family History  Problem Relation Age of Onset   Macular degeneration Mother    Stroke Father 94   Heart disease Brother    Cancer Maternal Grandmother        unknown type   Colon cancer Maternal Aunt 34   Colon polyps Neg Hx    Esophageal cancer Neg Hx    Rectal cancer Neg Hx    Stomach cancer Neg Hx      Review of Systems  Constitutional: Negative.  Negative for chills and fever.  HENT: Negative.  Negative for congestion and sore throat.   Respiratory: Negative.  Negative for cough and shortness of breath.   Cardiovascular: Negative.  Negative for chest pain and palpitations.  Gastrointestinal:  Negative for abdominal pain, diarrhea, nausea and vomiting.  Genitourinary:  Positive for frequency. Negative for dysuria and hematuria.  Musculoskeletal:  Positive for extremity weakness.  Skin: Negative.  Negative for rash.  Neurological:  Negative for dizziness and headaches.  All other systems reviewed and are negative.   Vitals:   11/04/24 1320  BP: 122/80  Pulse: 98  Temp: 98.2 F (36.8 C)    Physical Exam Vitals reviewed.  Constitutional:      Appearance: Normal appearance.  HENT:     Head: Normocephalic.     Mouth/Throat:     Mouth: Mucous membranes are moist.     Pharynx: Oropharynx is clear.  Eyes:     Extraocular Movements: Extraocular movements intact.     Pupils: Pupils are equal, round, and reactive to light.  Cardiovascular:     Rate and Rhythm: Normal rate and regular rhythm.     Pulses: Normal pulses.     Heart  sounds: Normal heart sounds.  Pulmonary:     Effort: Pulmonary effort is normal.     Breath sounds: Normal breath sounds.  Abdominal:     Palpations: Abdomen is soft.     Tenderness: There is no abdominal tenderness.  Musculoskeletal:     Cervical back: No tenderness.   Lymphadenopathy:     Cervical: No cervical adenopathy.  Skin:    General: Skin is warm and dry.     Capillary Refill: Capillary refill takes less than 2 seconds.  Neurological:     General: No focal deficit present.     Mental Status: She is alert and oriented to person, place, and time.  Psychiatric:        Mood and Affect: Mood normal.        Behavior: Behavior normal.      ASSESSMENT & PLAN: Problem List Items Addressed This Visit       Other   Urinary frequency - Primary   Clinical stable.  No red flag signs or symptoms. Differential diagnosis discussed Patient states recent MRI showing thickening of of urinary bladder This finding warrants urological evaluation Needs referral to urology Patient states it does not feel like infection.  Recommend urinalysis and urine culture Has been thiazide diuretic for which also contributes to urinary frequency No systemic symptoms.  Afebrile.  Unremarkable exam. Needs follow-up with neurologist and PCP. Also recommend blood work today.  GFR last October was 50 Highest creatinine 1.1 Advised to contact the office if no better or worse during the next several days Advised to stay well-hydrated      Relevant Orders   Urinalysis   Comprehensive metabolic panel with GFR   CBC with Differential/Platelet   Urine Culture   Patient Instructions  Peeing Often (Urinary Frequency) in Adults Urinary frequency means peeing, or urinating, more often than usual. This is also called overactive bladder. People usually pee 6 or 7 times in 24 hours. If you're concerned that you pee more often than usual, or if peeing often is starting to affect your day-to-day life, you may have an overactive bladder. How often you pee depends on: How much you drink and what you drink. How much you exercise. What medicines you're taking. The health of your bladder muscles and pelvic muscles. If you pee often, you may also feel an urgent need to pee. The stress  and anxiety of needing to find a bathroom quickly can make this problem feel worse. This problem may go away on its own. You may also wish to seek treatment. Home treatments are the first step in treating the problem. Surgery may be done if home treatment fails. Follow these instructions at home: Bladder health Your health care provider can suggest ways to help you improve your bladder health. You may be told to: Start a bladder diary. Keep track of these things: How often you pee. How much you pee at one time. What you eat and drink. If you leak any pee. Follow a bladder training program. You may be told to: Wait before going to the bathroom. In doing this, you learn to ignore some of the urges to pee. Wait a minute after you pee and try to pee again. This helps if you feel you are not completely emptying your bladder. Set a time to pee. The goal is to slowly increase the amount of time in which you wait to pee. Do Kegel exercises. These exercises help the muscles that control peeing to become stronger.  This may help you control the urge to pee.  Eating and drinking Eat and drink as told. You may be told to: Avoid caffeine. Drink small amounts of fluid more often. Avoid drinking in the evening. Stop drinking alcohol. Avoid foods or drinks that may irritate the bladder, such as: Coffee, tea, or soda. Artificial sweeteners. Citrus fruits, tomato-based foods, and chocolate. Urinary frequency may be worse if you have trouble pooping (constipation). You may need to take these steps to help prevent or treat the problem: Take medicines to help you poop. Eat foods high in fiber, like beans, whole grains, and fresh fruits and vegetables. Drink more fluids as told. General instructions Take your medicines only as told. Keep all follow-up visits. Your provider needs to check if the home treatments are working or if you need more treatment. Contact a health care provider if: You start peeing  more than before. You feel pain when you pee. You notice blood in your pee. Your pee looks cloudy. You have a fever or chills. You throw up or you feel like you may throw up. Get help right away if: You can't pee. This information is not intended to replace advice given to you by your health care provider. Make sure you discuss any questions you have with your health care provider. Document Revised: 08/21/2023 Document Reviewed: 08/21/2023 Elsevier Patient Education  2025 Elsevier Inc.     Emil Schaumann, MD [facility]    [1]  Allergies Allergen Reactions   Latex Shortness Of Breath

## 2024-11-04 NOTE — Patient Instructions (Signed)
 Peeing Often (Urinary Frequency) in Adults Urinary frequency means peeing, or urinating, more often than usual. This is also called overactive bladder. People usually pee 6 or 7 times in 24 hours. If you're concerned that you pee more often than usual, or if peeing often is starting to affect your day-to-day life, you may have an overactive bladder. How often you pee depends on: How much you drink and what you drink. How much you exercise. What medicines you're taking. The health of your bladder muscles and pelvic muscles. If you pee often, you may also feel an urgent need to pee. The stress and anxiety of needing to find a bathroom quickly can make this problem feel worse. This problem may go away on its own. You may also wish to seek treatment. Home treatments are the first step in treating the problem. Surgery may be done if home treatment fails. Follow these instructions at home: Bladder health Your health care provider can suggest ways to help you improve your bladder health. You may be told to: Start a bladder diary. Keep track of these things: How often you pee. How much you pee at one time. What you eat and drink. If you leak any pee. Follow a bladder training program. You may be told to: Wait before going to the bathroom. In doing this, you learn to ignore some of the urges to pee. Wait a minute after you pee and try to pee again. This helps if you feel you are not completely emptying your bladder. Set a time to pee. The goal is to slowly increase the amount of time in which you wait to pee. Do Kegel exercises. These exercises help the muscles that control peeing to become stronger. This may help you control the urge to pee.  Eating and drinking Eat and drink as told. You may be told to: Avoid caffeine. Drink small amounts of fluid more often. Avoid drinking in the evening. Stop drinking alcohol. Avoid foods or drinks that may irritate the bladder, such as: Coffee, tea, or  soda. Artificial sweeteners. Citrus fruits, tomato-based foods, and chocolate. Urinary frequency may be worse if you have trouble pooping (constipation). You may need to take these steps to help prevent or treat the problem: Take medicines to help you poop. Eat foods high in fiber, like beans, whole grains, and fresh fruits and vegetables. Drink more fluids as told. General instructions Take your medicines only as told. Keep all follow-up visits. Your provider needs to check if the home treatments are working or if you need more treatment. Contact a health care provider if: You start peeing more than before. You feel pain when you pee. You notice blood in your pee. Your pee looks cloudy. You have a fever or chills. You throw up or you feel like you may throw up. Get help right away if: You can't pee. This information is not intended to replace advice given to you by your health care provider. Make sure you discuss any questions you have with your health care provider. Document Revised: 08/21/2023 Document Reviewed: 08/21/2023 Elsevier Patient Education  2025 [employer].

## 2024-11-05 ENCOUNTER — Ambulatory Visit: Payer: Self-pay | Admitting: Emergency Medicine

## 2024-11-05 ENCOUNTER — Encounter: Payer: Self-pay | Admitting: Oncology

## 2024-11-05 LAB — URINALYSIS
Bilirubin Urine: NEGATIVE
Hgb urine dipstick: NEGATIVE
Leukocytes,Ua: NEGATIVE
Nitrite: NEGATIVE
Specific Gravity, Urine: 1.025 (ref 1.000–1.030)
Total Protein, Urine: NEGATIVE
Urine Glucose: NEGATIVE
Urobilinogen, UA: 1 (ref 0.0–1.0)
pH: 6.5 (ref 5.0–8.0)

## 2024-11-12 ENCOUNTER — Ambulatory Visit: Payer: Medicare (Managed Care) | Admitting: Internal Medicine

## 2024-11-15 ENCOUNTER — Other Ambulatory Visit: Payer: Self-pay | Admitting: Internal Medicine

## 2024-11-23 ENCOUNTER — Telehealth: Payer: Self-pay | Admitting: Physician Assistant

## 2024-11-23 NOTE — Addendum Note (Signed)
 Encounter addended by: Sherita Decoste L on: 11/23/2024 10:26 AM  Actions taken: Imaging Exam ended

## 2024-11-23 NOTE — Telephone Encounter (Signed)
 Patient had to rs appt due to provider out. Per patient request fill all Rx's when due . She will be out before appt on 1/22.

## 2024-11-23 NOTE — Telephone Encounter (Signed)
 RF were sent for a year 02/2024 except for clonazepam . Pt said she rarely uses it and doesn't need a RF currently. Has RF available on other meds.

## 2024-11-24 ENCOUNTER — Ambulatory Visit: Admitting: Physician Assistant

## 2024-12-16 ENCOUNTER — Telehealth (INDEPENDENT_AMBULATORY_CARE_PROVIDER_SITE_OTHER): Payer: Medicare (Managed Care) | Admitting: Physician Assistant

## 2024-12-16 ENCOUNTER — Encounter: Payer: Self-pay | Admitting: Physician Assistant

## 2024-12-16 ENCOUNTER — Encounter: Payer: Self-pay | Admitting: Oncology

## 2024-12-16 DIAGNOSIS — F411 Generalized anxiety disorder: Secondary | ICD-10-CM

## 2024-12-16 DIAGNOSIS — F3342 Major depressive disorder, recurrent, in full remission: Secondary | ICD-10-CM

## 2024-12-16 DIAGNOSIS — G4700 Insomnia, unspecified: Secondary | ICD-10-CM | POA: Diagnosis not present

## 2024-12-16 MED ORDER — VENLAFAXINE HCL ER 150 MG PO CP24: 150.0000 mg | ORAL_CAPSULE | Freq: Every day | ORAL | 1 refills | Status: AC

## 2024-12-16 MED ORDER — TRAZODONE HCL 100 MG PO TABS: 100.0000 mg | ORAL_TABLET | Freq: Every evening | ORAL | 1 refills | Status: AC | PRN

## 2024-12-16 NOTE — Progress Notes (Signed)
 "     [facility]  Patient ID: Jo Morrison,  MRN: [id number]  PCP: Jo Almarie DELENA, MD  Date of Evaluation: 12/16/2024 Time spent:20 minutes  Chief Complaint:  Chief Complaint   [name]   Virtual Visit via Telehealth  I connected with patient by a video enabled telemedicine application with their informed consent, and verified patient privacy and that I am speaking with the correct person using two identifiers.  I am private, in my office and the patient is at home.  I discussed the limitations, risks, security and privacy concerns of performing an evaluation and management service by video and the availability of in person appointments. I also discussed with the patient that there may be a patient responsible charge related to this service. The patient expressed understanding and agreed to proceed.   I discussed the assessment and treatment plan with the patient. The patient was provided an opportunity to ask questions and all were answered. The patient agreed with the plan and demonstrated an understanding of the instructions.   The patient was advised to call back or seek an in-person evaluation if the symptoms worsen or if the condition fails to improve as anticipated.  I provided approximately 20 minutes of non-face-to-face time during this encounter.  HISTORY/CURRENT STATUS: HPI for routine 51-month med check.  Jo Morrison is doing well for the most part.  She still follows up with her oncology team at [facility] and gets good reports.  She is still under some stress with her husband's declining health.  He is still living at home and she is thankful for that.  She is not having any symptoms of depression, she does get down some days but is able to do the things around the house that she needs to and does things that she enjoys.  Energy and motivation are fair to good depending on the day.  Personal hygiene is normal.  Appetite is normal.  No extreme sadness or feelings of  hopelessness.  She occasionally gets anxious and takes the Klonopin  but it is not common at all.  Sleeps well most of the time.  Mania, delirium, psychosis, or suicidal/homicidal thoughts.  Individual Medical History/ Review of Systems: Changes? :Yes     Continues to f/u with Onc at [facility]  Past medications for mental health diagnoses include: Effexor  XR, trazodone , Klonopin , Sonata  Allergies: Latex  Current Medications:  Current Outpatient Medications:    clonazePAM  (KLONOPIN ) 0.5 MG tablet, Take 0.5-1 tablets (0.25-0.5 mg total) by mouth 2 (two) times daily as needed., Disp: 30 tablet, Rfl: 0   dorzolamide-timolol (COSOPT) 2-0.5 % ophthalmic solution, Place 1 drop into the right eye 2 (two) times daily., Disp: , Rfl:    fluticasone  (FLONASE) 50 MCG/ACT nasal spray, Place 2 sprays into the nose daily as needed for allergies., Disp: , Rfl:    fluticasone  furoate-vilanterol (BREO ELLIPTA ) 200-25 MCG/ACT AEPB, INHALE 1 PUFF INTO THE LUNGS DAILY, Disp: 180 each, Rfl: 1   hydrochlorothiazide  (HYDRODIURIL ) 25 MG tablet, TAKE 1 TABLET(25 MG) BY MOUTH DAILY, Disp: 90 tablet, Rfl: 3   montelukast  (SINGULAIR ) 10 MG tablet, TAKE 1 TABLET(10 MG) BY MOUTH AT BEDTIME, Disp: 90 tablet, Rfl: 3   REFRESH OPTIVE ADVANCED 0.5-1-0.5 % SOLN, Place 1 drop into both eyes daily as needed (itching Eyes)., Disp: , Rfl:    traZODone  (DESYREL ) 100 MG tablet, Take 1-2 tablets (100-200 mg total) by mouth at bedtime as needed for sleep., Disp: 180 tablet, Rfl: 1   venlafaxine   XR (EFFEXOR -XR) 150 MG 24 hr capsule, Take 1 capsule (150 mg total) by mouth daily., Disp: 90 capsule, Rfl: 1 Medication Side Effects: none  Family Medical/ Social History: Changes? none  MENTAL HEALTH EXAM:  There were no vitals taken for this visit.There is no height or weight on file to calculate BMI.  General Appearance: Casual and Well Groomed  Eye Contact:  Good  Speech:  Clear and Coherent and Normal Rate  Volume:  Normal  Mood:   Euthymic  Affect:  Appropriate  Thought Process:  Goal Directed and Descriptions of Associations: Circumstantial  Orientation:  Full (Time, Place, and Person)  Thought Content: Logical   Suicidal Thoughts:  No  Homicidal Thoughts:  No  Memory:  WNL  Judgement:  Good  Insight:  Good  Psychomotor Activity:  Normal  Concentration:  Concentration: Good  Recall:  Good  Fund of Knowledge: Good  Language: Good  Assets:  Communication Skills Desire for Improvement Financial Resources/Insurance Housing Resilience Transportation  ADL's:  Intact  Cognition: WNL  Prognosis:  Good   DIAGNOSES:    ICD-10-CM   1. Recurrent major depressive disorder, in full remission  F33.42     2. Generalized anxiety disorder  F41.1     3. Insomnia, unspecified type  G47.00      Receiving Psychotherapy: No   RECOMMENDATIONS:  PDMP was reviewed.  Klonopin  filled 04/07/2024. I provided approximately 20  minutes of non-face-to-face time during this encounter, including time spent before and after the visit in records review, medical decision making, counseling pertinent to today's visit, and charting.   She is stable on the current medication regimen so no changes will be made.  Continue Klonopin  0.5 mg, 1/2-1 p.o. twice daily as needed.  (She rarely takes.) Continue trazodone  100 mg, 1-2 nightly as needed sleep. Continue Effexor  XR 150 mg, 1 p.o. daily. Return in 6 months.  Jo Cooks, PA-C  "

## 2025-06-17 ENCOUNTER — Ambulatory Visit: Payer: Medicare (Managed Care)
